# Patient Record
Sex: Female | Born: 1945
Health system: Southern US, Community
[De-identification: ages and names within clinical notes are randomized; demographics above are authoritative.]

## PROBLEM LIST (undated history)

## (undated) DIAGNOSIS — I10 Essential (primary) hypertension: Secondary | ICD-10-CM

## (undated) DIAGNOSIS — M1990 Unspecified osteoarthritis, unspecified site: Secondary | ICD-10-CM

## (undated) DIAGNOSIS — I739 Peripheral vascular disease, unspecified: Secondary | ICD-10-CM

## (undated) DIAGNOSIS — I252 Old myocardial infarction: Secondary | ICD-10-CM

## (undated) DIAGNOSIS — R569 Unspecified convulsions: Secondary | ICD-10-CM

## (undated) DIAGNOSIS — E119 Type 2 diabetes mellitus without complications: Secondary | ICD-10-CM

## (undated) DIAGNOSIS — Z9289 Personal history of other medical treatment: Secondary | ICD-10-CM

## (undated) DIAGNOSIS — F32A Depression, unspecified: Secondary | ICD-10-CM

## (undated) DIAGNOSIS — I2510 Atherosclerotic heart disease of native coronary artery without angina pectoris: Secondary | ICD-10-CM

## (undated) DIAGNOSIS — T7840XA Allergy, unspecified, initial encounter: Secondary | ICD-10-CM

## (undated) DIAGNOSIS — F319 Bipolar disorder, unspecified: Secondary | ICD-10-CM

## (undated) DIAGNOSIS — Z5189 Encounter for other specified aftercare: Secondary | ICD-10-CM

## (undated) DIAGNOSIS — I639 Cerebral infarction, unspecified: Secondary | ICD-10-CM

## (undated) DIAGNOSIS — I6529 Occlusion and stenosis of unspecified carotid artery: Secondary | ICD-10-CM

## (undated) DIAGNOSIS — E278 Other specified disorders of adrenal gland: Secondary | ICD-10-CM

## (undated) DIAGNOSIS — F329 Major depressive disorder, single episode, unspecified: Secondary | ICD-10-CM

## (undated) HISTORY — DX: Personal history of other medical treatment: Z92.89

## (undated) HISTORY — DX: Type 2 diabetes mellitus without complications: E11.9

## (undated) HISTORY — DX: Depression, unspecified: F32.A

## (undated) HISTORY — DX: Encounter for other specified aftercare: Z51.89

## (undated) HISTORY — DX: Allergy, unspecified, initial encounter: T78.40XA

## (undated) HISTORY — DX: Major depressive disorder, single episode, unspecified: F32.9

## (undated) HISTORY — DX: Unspecified osteoarthritis, unspecified site: M19.90

## (undated) HISTORY — PX: NECK SURGERY: SHX720

## (undated) HISTORY — DX: Unspecified convulsions: R56.9

---

## 2010-01-25 HISTORY — PX: BREAST SURGERY: SHX581

## 2010-03-02 DIAGNOSIS — J4489 Other specified chronic obstructive pulmonary disease: Secondary | ICD-10-CM

## 2010-03-02 DIAGNOSIS — J449 Chronic obstructive pulmonary disease, unspecified: Secondary | ICD-10-CM | POA: Insufficient documentation

## 2010-03-02 DIAGNOSIS — I10 Essential (primary) hypertension: Secondary | ICD-10-CM | POA: Insufficient documentation

## 2010-03-02 DIAGNOSIS — F3289 Other specified depressive episodes: Secondary | ICD-10-CM | POA: Insufficient documentation

## 2010-03-02 DIAGNOSIS — D649 Anemia, unspecified: Secondary | ICD-10-CM | POA: Insufficient documentation

## 2010-03-02 DIAGNOSIS — R062 Wheezing: Secondary | ICD-10-CM | POA: Insufficient documentation

## 2010-03-02 DIAGNOSIS — I6789 Other cerebrovascular disease: Secondary | ICD-10-CM | POA: Insufficient documentation

## 2010-03-02 DIAGNOSIS — R059 Cough, unspecified: Secondary | ICD-10-CM | POA: Insufficient documentation

## 2010-03-02 DIAGNOSIS — J189 Pneumonia, unspecified organism: Secondary | ICD-10-CM | POA: Insufficient documentation

## 2010-03-02 DIAGNOSIS — E1149 Type 2 diabetes mellitus with other diabetic neurological complication: Secondary | ICD-10-CM | POA: Insufficient documentation

## 2010-03-02 DIAGNOSIS — R0602 Shortness of breath: Secondary | ICD-10-CM | POA: Insufficient documentation

## 2010-03-02 HISTORY — DX: Other specified chronic obstructive pulmonary disease: J44.89

## 2010-03-02 HISTORY — DX: Chronic obstructive pulmonary disease, unspecified: J44.9

## 2014-05-09 ENCOUNTER — Emergency Department (HOSPITAL_COMMUNITY): Payer: Medicare Other

## 2014-05-09 ENCOUNTER — Encounter (HOSPITAL_COMMUNITY): Payer: Self-pay

## 2014-05-09 ENCOUNTER — Emergency Department (HOSPITAL_COMMUNITY)
Admission: EM | Admit: 2014-05-09 | Discharge: 2014-05-10 | Disposition: A | Discharge status: Home / Self Care | Payer: Medicare Other | Attending: Emergency Medicine | Admitting: Emergency Medicine

## 2014-05-09 DIAGNOSIS — Z7982 Long term (current) use of aspirin: Secondary | ICD-10-CM | POA: Diagnosis not present

## 2014-05-09 DIAGNOSIS — Z79899 Other long term (current) drug therapy: Secondary | ICD-10-CM | POA: Diagnosis not present

## 2014-05-09 DIAGNOSIS — I2510 Atherosclerotic heart disease of native coronary artery without angina pectoris: Secondary | ICD-10-CM | POA: Insufficient documentation

## 2014-05-09 DIAGNOSIS — I252 Old myocardial infarction: Secondary | ICD-10-CM | POA: Insufficient documentation

## 2014-05-09 DIAGNOSIS — F319 Bipolar disorder, unspecified: Secondary | ICD-10-CM | POA: Diagnosis not present

## 2014-05-09 DIAGNOSIS — Z8673 Personal history of transient ischemic attack (TIA), and cerebral infarction without residual deficits: Secondary | ICD-10-CM | POA: Diagnosis not present

## 2014-05-09 DIAGNOSIS — G479 Sleep disorder, unspecified: Secondary | ICD-10-CM | POA: Diagnosis not present

## 2014-05-09 DIAGNOSIS — Z7902 Long term (current) use of antithrombotics/antiplatelets: Secondary | ICD-10-CM | POA: Insufficient documentation

## 2014-05-09 DIAGNOSIS — R4182 Altered mental status, unspecified: Secondary | ICD-10-CM | POA: Diagnosis present

## 2014-05-09 DIAGNOSIS — F419 Anxiety disorder, unspecified: Secondary | ICD-10-CM | POA: Insufficient documentation

## 2014-05-09 DIAGNOSIS — R4701 Aphasia: Secondary | ICD-10-CM | POA: Diagnosis not present

## 2014-05-09 DIAGNOSIS — I10 Essential (primary) hypertension: Secondary | ICD-10-CM | POA: Insufficient documentation

## 2014-05-09 HISTORY — DX: Cerebral infarction, unspecified: I63.9

## 2014-05-09 HISTORY — DX: Essential (primary) hypertension: I10

## 2014-05-09 HISTORY — DX: Bipolar disorder, unspecified: F31.9

## 2014-05-09 HISTORY — DX: Atherosclerotic heart disease of native coronary artery without angina pectoris: I25.10

## 2014-05-09 HISTORY — DX: Old myocardial infarction: I25.2

## 2014-05-09 LAB — URINALYSIS, ROUTINE W REFLEX MICROSCOPIC
Bilirubin Urine: NEGATIVE
Glucose, UA: NEGATIVE mg/dL
HGB URINE DIPSTICK: NEGATIVE
KETONES UR: NEGATIVE mg/dL
NITRITE: NEGATIVE
PROTEIN: NEGATIVE mg/dL
SPECIFIC GRAVITY, URINE: 1.016 (ref 1.005–1.030)
Urobilinogen, UA: 0.2 mg/dL (ref 0.0–1.0)
pH: 6.5 (ref 5.0–8.0)

## 2014-05-09 LAB — CBC
HEMATOCRIT: 38.2 % (ref 36.0–46.0)
HEMOGLOBIN: 11.7 g/dL — AB (ref 12.0–15.0)
MCH: 24.5 pg — AB (ref 26.0–34.0)
MCHC: 30.6 g/dL (ref 30.0–36.0)
MCV: 79.9 fL (ref 78.0–100.0)
Platelets: 302 10*3/uL (ref 150–400)
RBC: 4.78 MIL/uL (ref 3.87–5.11)
RDW: 21 % — ABNORMAL HIGH (ref 11.5–15.5)
WBC: 8.7 10*3/uL (ref 4.0–10.5)

## 2014-05-09 LAB — COMPREHENSIVE METABOLIC PANEL
ALK PHOS: 92 U/L (ref 39–117)
ALT: 31 U/L (ref 0–35)
AST: 29 U/L (ref 0–37)
Albumin: 4.7 g/dL (ref 3.5–5.2)
Anion gap: 7 (ref 5–15)
BUN: 24 mg/dL — ABNORMAL HIGH (ref 6–23)
CO2: 29 mmol/L (ref 19–32)
Calcium: 9.6 mg/dL (ref 8.4–10.5)
Chloride: 99 mmol/L (ref 96–112)
Creatinine, Ser: 1.2 mg/dL — ABNORMAL HIGH (ref 0.50–1.10)
GFR calc Af Amer: 53 mL/min — ABNORMAL LOW (ref >90)
GFR calc non Af Amer: 45 mL/min — ABNORMAL LOW (ref >90)
GLUCOSE: 182 mg/dL — AB (ref 70–99)
Potassium: 3.7 mmol/L (ref 3.5–5.1)
SODIUM: 135 mmol/L (ref 135–145)
TOTAL PROTEIN: 7.4 g/dL (ref 6.0–8.3)
Total Bilirubin: 0.4 mg/dL (ref 0.3–1.2)

## 2014-05-09 LAB — PROTIME-INR
INR: 0.9 (ref 0.00–1.49)
PROTHROMBIN TIME: 12.2 s (ref 11.6–15.2)

## 2014-05-09 LAB — URINE MICROSCOPIC-ADD ON

## 2014-05-09 LAB — CBG MONITORING, ED: Glucose-Capillary: 165 mg/dL — ABNORMAL HIGH (ref 70–99)

## 2014-05-09 LAB — LACTIC ACID, PLASMA: Lactic Acid, Venous: 1.3 mmol/L (ref 0.5–2.0)

## 2014-05-09 MED ORDER — SODIUM CHLORIDE 0.9 % IV SOLN: 1000.0000 mL | INTRAVENOUS | Status: DC

## 2014-05-09 MED ORDER — SODIUM CHLORIDE 0.9 % IV SOLN
1000.0000 mL | Freq: Once | INTRAVENOUS | Status: AC
  Administered 2014-05-09: 1000 mL via INTRAVENOUS

## 2014-05-09 NOTE — ED Provider Notes (Signed)
CSN: [id number]     Arrival date & time 05/09/14  2048 History   First MD Initiated Contact with Patient 05/09/14 2119     Chief Complaint  Patient presents with  . Altered Mental Status     (Consider location/radiation/quality/duration/timing/severity/associated sxs/prior Treatment) HPI Patient presents with her daughter who assists with the history of present illness. The patient herself states she does not want to be here. She acknowledges the patient's daughters concerns. Daughter states that twice during daily the patient has had episodes of slowed speech, without true dysarthria, and without other changes. Each episode lasted a few moments, and then the speech pattern returned to normal. Physician, the patient has had multiple episodes of labile behavior today, with inconsistent reactions, yelling, aggressive behavior. Patient denies recent medication changes, acknowledges a history of bipolar disease, hypertension, coronary artery disease.  Past Medical History  Diagnosis Date  . Bipolar 1 disorder   . Coronary artery disease   . Hypertension   . Stroke   . MI, old    Past Surgical History  Procedure Laterality Date  . Breast surgery    . Neck surgery     History reviewed. No pertinent family history. History  Substance Use Topics  . Smoking status: Never Smoker   . Smokeless tobacco: Not on file  . Alcohol Use: No   OB History    No data available     Review of Systems  Constitutional:       Per HPI, otherwise negative  HENT:       Per HPI, otherwise negative  Respiratory:       Per HPI, otherwise negative  Cardiovascular:       Per HPI, otherwise negative  Gastrointestinal: Negative for vomiting.  Endocrine:       Negative aside from HPI  Genitourinary:       Neg aside from HPI   Musculoskeletal:       Per HPI, otherwise negative  Skin: Negative.   Neurological: Negative for syncope.  Psychiatric/Behavioral: Positive for behavioral problems,  confusion, sleep disturbance, decreased concentration and agitation. Negative for suicidal ideas and hallucinations. The patient is nervous/anxious.       Allergies  Augmentin  Home Medications   Prior to Admission medications   Medication Sig Start Date End Date Taking? Authorizing Provider  ALPRAZolam Duanne Moron) 1 MG tablet Take 1 mg by mouth as needed for anxiety.   Yes Historical Provider, MD  asenapine (SAPHRIS) 5 MG SUBL 24 hr tablet Place 5 mg under the tongue at bedtime.   Yes Historical Provider, MD  aspirin 81 MG chewable tablet Chew 81 mg by mouth daily.   Yes Historical Provider, MD  atorvastatin (LIPITOR) 80 MG tablet Take 80 mg by mouth daily.   Yes Historical Provider, MD  clopidogrel (PLAVIX) 75 MG tablet Take 75 mg by mouth daily.   Yes Historical Provider, MD  ferrous sulfate 325 (65 FE) MG tablet Take 325 mg by mouth daily with breakfast.   Yes Historical Provider, MD  Fluticasone Furoate-Vilanterol 200-25 MCG/INH AEPB Inhale 1 puff into the lungs every morning.   Yes Historical Provider, MD  fluticasone-salmeterol (ADVAIR HFA) 115-21 MCG/ACT inhaler Inhale 1 puff into the lungs every morning.   Yes Historical Provider, MD  furosemide (LASIX) 40 MG tablet Take 40 mg by mouth every morning. May take 2 tabs if retaining fluid   Yes Historical Provider, MD  gabapentin (NEURONTIN) 300 MG capsule Take 300 mg by mouth 3 (three) times  daily.   Yes Historical Provider, MD  isosorbide mononitrate (IMDUR) 30 MG 24 hr tablet Take 30 mg by mouth every morning.   Yes Historical Provider, MD  lamoTRIgine (LAMICTAL) 200 MG tablet Take 200 mg by mouth every evening.   Yes Historical Provider, MD  metoprolol tartrate (LOPRESSOR) 25 MG tablet Take 25 mg by mouth 2 (two) times daily.   Yes Historical Provider, MD  naproxen (NAPROSYN) 500 MG tablet Take 500 mg by mouth 2 (two) times daily as needed for mild pain.   Yes Historical Provider, MD  nitroGLYCERIN (NITROSTAT) 0.4 MG SL tablet Place  0.4 mg under the tongue every 5 (five) minutes as needed for chest pain.   Yes Historical Provider, MD  potassium chloride (K-DUR,KLOR-CON) 10 MEQ tablet Take 10 mEq by mouth 3 (three) times daily.   Yes Historical Provider, MD   BP 140/63 mmHg  Pulse 62  Temp(Src) 97.8 F (36.6 C) (Oral)  Resp 19  Ht 5' 0.75" (1.543 m)  Wt 125 lb (56.7 kg)  BMI 23.82 kg/m2  SpO2 98% Physical Exam  Constitutional: She is oriented to person, place, and time. She appears well-developed and well-nourished. No distress.  HENT:  Head: Normocephalic and atraumatic.  Eyes: Conjunctivae and EOM are normal.  Cardiovascular: Normal rate and regular rhythm.   Pulmonary/Chest: Effort normal and breath sounds normal. No stridor. No respiratory distress.  Abdominal: She exhibits no distension.  Musculoskeletal: She exhibits no edema.  Neurological: She is alert and oriented to person, place, and time. She displays no atrophy and no tremor. No cranial nerve deficit. She exhibits normal muscle tone. She displays no seizure activity. Coordination normal.  Skin: Skin is warm and dry.  Psychiatric: Her mood appears anxious. Her speech is rapid and/or pressured. She is agitated, aggressive and hyperactive. Thought content is not paranoid and not delusional. Cognition and memory are not impaired. She expresses impulsivity. She expresses no suicidal ideation. She expresses no suicidal plans.  Nursing note and vitals reviewed.   ED Course  Procedures (including critical care time) Labs Review Labs Reviewed  CBC - Abnormal; Notable for the following:    Hemoglobin 11.7 (*)    MCH 24.5 (*)    RDW 21.0 (*)    All other components within normal limits  COMPREHENSIVE METABOLIC PANEL - Abnormal; Notable for the following:    Glucose, Bld 182 (*)    BUN 24 (*)    Creatinine, Ser 1.20 (*)    GFR calc non Af Amer 45 (*)    GFR calc Af Amer 53 (*)    All other components within normal limits  URINALYSIS, ROUTINE W REFLEX  MICROSCOPIC - Abnormal; Notable for the following:    APPearance CLOUDY (*)    Leukocytes, UA SMALL (*)    All other components within normal limits  URINE MICROSCOPIC-ADD ON - Abnormal; Notable for the following:    Squamous Epithelial / LPF FEW (*)    All other components within normal limits  CBG MONITORING, ED - Abnormal; Notable for the following:    Glucose-Capillary 165 (*)    All other components within normal limits  LACTIC ACID, PLASMA  PROTIME-INR    Imaging Review Ct Head Wo Contrast  05/09/2014   CLINICAL DATA:  Altered mental status.  EXAM: CT HEAD WITHOUT CONTRAST  TECHNIQUE: Contiguous axial images were obtained from the base of the skull through the vertex without intravenous contrast.  COMPARISON:  None.  FINDINGS: Skull and Sinuses:No fracture or destructive  process. Mild plagiocephaly.  Orbits: Bilateral cataract resection  Brain: Dense gliosis with encephalomalacia in the right PCA territory consistent with remote infarct. There is low-attenuation around the lateral ventricles compatible with chronic small vessel disease. No evidence for acute infarct, hemorrhage, hydrocephalus, or shift.  IMPRESSION: 1. No acute intracranial findings. 2. Remote right PCA infarct. 3. Mild chronic small vessel disease.   Electronically Signed   By: Monte Fantasia M.D.   On: 05/09/2014 23:41     EKG Interpretation   Date/Time:  Thursday May 09 2014 22:06:57 EDT Ventricular Rate:  61 PR Interval:  141 QRS Duration: 95 QT Interval:  418 QTC Calculation: 421 R Axis:   71 Text Interpretation:  Sinus rhythm Minimal ST depression, diffuse leads  Sinus rhythm Artifact Non-specific intra-ventricular conduction delay ST-t  wave abnormality Abnormal ekg Confirmed by Carmin Muskrat  MD ([id number]) on  05/09/2014 10:50:31 PM     12:08 AM On repeat exam the patient appears calm, in no distress. I discussed all findings with her and her daughter.  MDM   Final diagnoses:  Aphasia     Patient presents with episodic aphasia, more labile behavior than usual. Patient has multiple medical issues, including bipolar disease, prior stroke. Patient is on Plavix, and with no evidence for acute new pathology, already has maximum medical prophylaxis for strokes. No evidence for other acute new pathology, including infection, ACS. Patient remained hemodynamically stable, in no distress throughout her course here, was discharged in stable condition.    Carmin Muskrat, MD 05/10/14 [phone]
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
## 2014-05-09 NOTE — ED Notes (Signed)
Nurse getting labs 

## 2014-05-10 NOTE — Discharge Instructions (Signed)
As discussed, your evaluation today has been largely reassuring.  But, it is important that you monitor your condition carefully, and do not hesitate to return to the ED if you develop new, or concerning changes in your condition. ? ?Otherwise, please follow-up with your physician for appropriate ongoing care. ? ?

## 2014-05-10 NOTE — ED Notes (Signed)
Pt is upset because she wasn't getting any Mental Health help, the daughter understands that need to be doen in [place], [place]. Daughter is ok with the medical exam that was performed, that was her main concern.

## 2014-06-07 ENCOUNTER — Emergency Department (INDEPENDENT_AMBULATORY_CARE_PROVIDER_SITE_OTHER)
Admission: EM | Admit: 2014-06-07 | Discharge: 2014-06-07 | Disposition: A | Discharge status: Other Acute Inpatient Hospital | Payer: Medicare Other | Source: Home / Self Care | Attending: Family Medicine | Admitting: Family Medicine

## 2014-06-07 ENCOUNTER — Emergency Department (HOSPITAL_COMMUNITY): Payer: Medicare Other

## 2014-06-07 ENCOUNTER — Encounter (HOSPITAL_COMMUNITY): Payer: Self-pay

## 2014-06-07 ENCOUNTER — Emergency Department (HOSPITAL_COMMUNITY)
Admission: EM | Admit: 2014-06-07 | Discharge: 2014-06-07 | Disposition: A | Discharge status: Home / Self Care | Payer: Medicare Other | Attending: Emergency Medicine | Admitting: Emergency Medicine

## 2014-06-07 DIAGNOSIS — Z7951 Long term (current) use of inhaled steroids: Secondary | ICD-10-CM | POA: Insufficient documentation

## 2014-06-07 DIAGNOSIS — R51 Headache: Secondary | ICD-10-CM | POA: Diagnosis not present

## 2014-06-07 DIAGNOSIS — F319 Bipolar disorder, unspecified: Secondary | ICD-10-CM | POA: Diagnosis not present

## 2014-06-07 DIAGNOSIS — Z7982 Long term (current) use of aspirin: Secondary | ICD-10-CM | POA: Insufficient documentation

## 2014-06-07 DIAGNOSIS — I10 Essential (primary) hypertension: Secondary | ICD-10-CM | POA: Insufficient documentation

## 2014-06-07 DIAGNOSIS — I252 Old myocardial infarction: Secondary | ICD-10-CM | POA: Diagnosis not present

## 2014-06-07 DIAGNOSIS — Z880 Allergy status to penicillin: Secondary | ICD-10-CM | POA: Insufficient documentation

## 2014-06-07 DIAGNOSIS — M5481 Occipital neuralgia: Secondary | ICD-10-CM

## 2014-06-07 DIAGNOSIS — Z8673 Personal history of transient ischemic attack (TIA), and cerebral infarction without residual deficits: Secondary | ICD-10-CM | POA: Diagnosis not present

## 2014-06-07 DIAGNOSIS — Z7902 Long term (current) use of antithrombotics/antiplatelets: Secondary | ICD-10-CM | POA: Insufficient documentation

## 2014-06-07 DIAGNOSIS — Z79899 Other long term (current) drug therapy: Secondary | ICD-10-CM | POA: Insufficient documentation

## 2014-06-07 DIAGNOSIS — I2510 Atherosclerotic heart disease of native coronary artery without angina pectoris: Secondary | ICD-10-CM | POA: Diagnosis not present

## 2014-06-07 DIAGNOSIS — R519 Headache, unspecified: Secondary | ICD-10-CM

## 2014-06-07 LAB — CBC WITH DIFFERENTIAL/PLATELET
BASOS PCT: 1 % (ref 0–1)
Basophils Absolute: 0.1 10*3/uL (ref 0.0–0.1)
EOS PCT: 2 % (ref 0–5)
Eosinophils Absolute: 0.2 10*3/uL (ref 0.0–0.7)
HCT: 35.9 % — ABNORMAL LOW (ref 36.0–46.0)
Hemoglobin: 11.5 g/dL — ABNORMAL LOW (ref 12.0–15.0)
LYMPHS PCT: 18 % (ref 12–46)
Lymphs Abs: 1.5 10*3/uL (ref 0.7–4.0)
MCH: 25.9 pg — ABNORMAL LOW (ref 26.0–34.0)
MCHC: 32 g/dL (ref 30.0–36.0)
MCV: 80.9 fL (ref 78.0–100.0)
MONO ABS: 0.9 10*3/uL (ref 0.1–1.0)
Monocytes Relative: 11 % (ref 3–12)
NEUTROS PCT: 68 % (ref 43–77)
Neutro Abs: 5.6 10*3/uL (ref 1.7–7.7)
Platelets: 234 10*3/uL (ref 150–400)
RBC: 4.44 MIL/uL (ref 3.87–5.11)
RDW: 22.4 % — ABNORMAL HIGH (ref 11.5–15.5)
WBC: 8.3 10*3/uL (ref 4.0–10.5)

## 2014-06-07 LAB — BASIC METABOLIC PANEL
Anion gap: 11 (ref 5–15)
BUN: 20 mg/dL (ref 6–20)
CO2: 26 mmol/L (ref 22–32)
Calcium: 9.4 mg/dL (ref 8.9–10.3)
Chloride: 101 mmol/L (ref 101–111)
Creatinine, Ser: 0.92 mg/dL (ref 0.44–1.00)
GFR calc Af Amer: >60 mL/min (ref >60)
Glucose, Bld: 99 mg/dL (ref 65–99)
POTASSIUM: 4.5 mmol/L (ref 3.5–5.1)
Sodium: 138 mmol/L (ref 135–145)

## 2014-06-07 LAB — GRAM STAIN

## 2014-06-07 LAB — CSF CELL COUNT WITH DIFFERENTIAL
RBC COUNT CSF: 8 /mm3 — AB
RBC Count, CSF: 0 /mm3
TUBE #: 4
Tube #: 1
WBC, CSF: 1 /mm3 (ref 0–5)
WBC, CSF: 2 /mm3 (ref 0–5)

## 2014-06-07 LAB — PROTEIN, CSF: Total  Protein, CSF: 23 mg/dL (ref 15–45)

## 2014-06-07 LAB — SEDIMENTATION RATE: SED RATE: 30 mm/h — AB (ref 0–22)

## 2014-06-07 LAB — GLUCOSE, CSF: Glucose, CSF: 64 mg/dL (ref 40–70)

## 2014-06-07 MED ORDER — SODIUM CHLORIDE 0.9 % IV BOLUS (SEPSIS)
500.0000 mL | Freq: Once | INTRAVENOUS | Status: AC
  Administered 2014-06-07: 500 mL via INTRAVENOUS

## 2014-06-07 MED ORDER — SODIUM CHLORIDE 0.9 % IV SOLN
Freq: Once | INTRAVENOUS | Status: AC
  Administered 2014-06-07: 12:00:00 via INTRAVENOUS

## 2014-06-07 MED ORDER — CARBAMAZEPINE ER 200 MG PO TB12: 200.0000 mg | ORAL_TABLET | Freq: Two times a day (BID) | ORAL | Status: DC

## 2014-06-07 MED ORDER — PROCHLORPERAZINE EDISYLATE 5 MG/ML IJ SOLN
10.0000 mg | Freq: Once | INTRAMUSCULAR | Status: AC
  Administered 2014-06-07: 10 mg via INTRAVENOUS
  Filled 2014-06-07: qty 2

## 2014-06-07 MED ORDER — IOHEXOL 350 MG/ML SOLN
50.0000 mL | Freq: Once | INTRAVENOUS | Status: AC | PRN
  Administered 2014-06-07: 50 mL via INTRAVENOUS

## 2014-06-07 MED ORDER — ONDANSETRON HCL 4 MG/2ML IJ SOLN
4.0000 mg | Freq: Once | INTRAMUSCULAR | Status: AC
  Administered 2014-06-07: 4 mg via INTRAVENOUS
  Filled 2014-06-07: qty 2

## 2014-06-07 MED ORDER — CARBAMAZEPINE ER 200 MG PO TB12
200.0000 mg | ORAL_TABLET | ORAL | Status: AC
  Administered 2014-06-07: 200 mg via ORAL
  Filled 2014-06-07: qty 1

## 2014-06-07 MED ORDER — CARBAMAZEPINE 200 MG PO TABS: 200.0000 mg | ORAL_TABLET | Freq: Two times a day (BID) | ORAL | Status: DC

## 2014-06-07 MED ORDER — HYDROMORPHONE HCL 1 MG/ML IJ SOLN
0.5000 mg | Freq: Once | INTRAMUSCULAR | Status: AC
  Administered 2014-06-07: 0.5 mg via INTRAVENOUS
  Filled 2014-06-07: qty 1

## 2014-06-07 MED ORDER — SODIUM CHLORIDE 0.9 % IV SOLN
Freq: Once | INTRAVENOUS | Status: AC
  Administered 2014-06-07: 13:00:00 via INTRAVENOUS

## 2014-06-07 MED ORDER — DIPHENHYDRAMINE HCL 50 MG/ML IJ SOLN
25.0000 mg | Freq: Once | INTRAMUSCULAR | Status: AC
  Administered 2014-06-07: 25 mg via INTRAVENOUS
  Filled 2014-06-07: qty 1

## 2014-06-07 NOTE — ED Notes (Signed)
C/o HA x 2 days. minimal memory , due to prior CVA. Reported had multiple vascular blockages which will require surgery, but was reportedly delayed by provider due to physical condition. NAD, w/d/color good. Ambulatory . No observable deficits on inspection. HA "8" on 1-10 scale. On Plavix due to cardiac issues. No known injury

## 2014-06-07 NOTE — ED Notes (Signed)
[facility] called for transport. Melissa, CN, advised of pending trnasfer

## 2014-06-07 NOTE — ED Notes (Signed)
Pt presents with intermittent sharp pain to L side of head x 3 days.  Pt denies any headache, unsure of injury, pt has residual memory loss from CVA in October.

## 2014-06-07 NOTE — ED Provider Notes (Signed)
1630 signed out by Dr Betsey Holiday, that if cta neg acute, to d/c home per neurology recommendations.  Neurology, Dr Leonel Ramsay has evaluated in ED, reviewed ct/cta - he indicates d/c to home w rx tegretol 200 mg bid.  Recheck pt comfortable, afeb. Will d/c per neurology recommendations. Return precautions provided.      Lajean Saver, MD 06/07/14 1754

## 2014-06-07 NOTE — Discharge Instructions (Signed)
It was our pleasure to provide your ER care today - we hope that you feel better.  Rest. Drink plenty of fluids.  See post LP instructions.  Take tegretol as prescribed.  Follow up with your doctor/neurologist in coming week for recheck - see referral - call office Monday morning to arrange follow up with them.    Your CTA was read as follows (discuss these results with neurologist/your doctor at follow up with them in coming week):  IMPRESSION: Remote RIGHT PCA territory cerebral infarction as described.  75% ostial stenosis of the dominant LEFT vertebral.  Otherwise no intracranial or extracranial flow reducing lesion, dissection, or occlusion.      Return to ER if worse, new symptoms, fevers, worsening or severe pain, persistent vomiting, other concern.     Occipital Neuralgia Occipital neuralgia is a type of headache that causes episodes of very bad pain in the back of your head. Pain from occipital neuralgia may spread (radiate) to other parts of your head. The pain is usually brief and often goes away after you rest and relax. These headaches may be caused by irritation of the nerves that leave your spinal cord high up in your neck, just below the base of your skull (occipital nerves). Your occipital nerves transmit sensations from the back of your head, the top of your head, and the areas behind your ears. CAUSES Occipital neuralgia can occur without any known cause (primary headache syndrome). In other cases, occipital neuralgia is caused by pressure on or irritation of one of the two occipital nerves. Causes of occipital nerve compression or irritation include:  Wear and tear of the vertebrae in the neck (osteoarthritis).  Neck injury.  Disease of the disks that separate the vertebrae.  Tumors.  Gout.  Infections.  Diabetes.  Swollen blood vessels that put pressure on the occipital nerves.  Muscle spasm in the neck. SIGNS AND SYMPTOMS Pain is the main  symptom of occipital neuralgia. It usually starts in the back of the head but may also be felt in other areas supplied by the occipital nerves. Pain is usually on one side but may be on both sides. You may have:   Brief episodes of very bad pain that is burning, stabbing, shocking, or shooting.  Pain behind the eye.  Pain triggered by neck movement or hair brushing.  Scalp tenderness.  Aching in the back of the head between episodes of very bad pain. DIAGNOSIS  Your health care provider may diagnose occipital neuralgia based on your symptoms and a physical exam. During the exam, the health care provider may push on areas supplied by the occipital nerves to see if they are painful. Some tests may also be done to help in making the diagnosis. These may include:  Imaging studies of the upper spinal cord, such as an MRI or CT scan. These may show compression or spinal cord abnormalities.  Nerve block. You will get an injection of numbing medicine (local anesthetic) near the occipital nerve to see if this relieves pain. TREATMENT  Treatment may begin with simple measures, such as:   Rest.  Massage.  Heat.  Over-the-counter pain relievers. If these measures do not work, you may need other treatments, including:  Medicines such as:  Prescription-strength anti-inflammatory medicines.  Muscle relaxants.  Antiseizure medicines.  Antidepressants.  Steroid injection. This involves injections of local anesthetic and strong anti-inflammatory drugs (steroids).  Pulsed radiofrequency. Wires are implanted to deliver electrical impulses that block pain signals from the occipital  nerve.  Physical therapy.  Surgery to relieve nerve pressure. HOME CARE INSTRUCTIONS  Take all medicines as directed by your health care provider.  Avoid activities that cause pain.  Rest when you have an attack of pain.  Try gentle massage or a heating pad to relieve pain.  Work with a physical  therapist to learn stretching exercises you can do at home.  Try a different pillow or sleeping position.  Practice good posture.  Try to stay active. Get regular exercise that does not cause pain. Ask your health care provider to suggest safe exercises for you.  Keep all follow-up visits as directed by your health care provider. This is important. SEEK MEDICAL CARE IF:  Your medicine is not working.  You have new or worsening symptoms. SEEK IMMEDIATE MEDICAL CARE IF:  You have very bad head pain that is not going away.  You have a sudden change in vision, balance, or speech. MAKE SURE YOU:  Understand these instructions.  Will watch your condition.  Will get help right away if you are not doing well or get worse. Document Released: 01/05/2001 Document Revised: 05/28/2013 Document Reviewed: 01/03/2013 [facility] Patient Information 2015 [place], [place]. This information is not intended to replace advice given to you by your health care provider. Make sure you discuss any questions you have with your health care provider.   Lumbar Puncture, Care After Refer to this sheet in the next few weeks. These instructions provide you with information on caring for yourself after your procedure. Your health care provider may also give you more specific instructions. Your treatment has been planned according to current medical practices, but problems sometimes occur. Call your health care provider if you have any problems or questions after your procedure. WHAT TO EXPECT AFTER THE PROCEDURE After your procedure, it is typical to have the following sensations:  Mild discomfort or pain at the insertion site.  Mild headache that is relieved with pain medicines. HOME CARE INSTRUCTIONS  Avoid lifting anything heavier than 10 lb (4.5 kg) for at least 12 hours after the procedure.  Drink enough fluids to keep your urine clear or pale yellow. SEEK MEDICAL CARE IF:  You have fever or  chills.  You have nausea or vomiting.  You have a headache that lasts for more than 2 days. SEEK IMMEDIATE MEDICAL CARE IF:  You have any numbness or tingling in your legs.  You are unable to control your bowel or bladder.  You have bleeding or swelling in your back at the insertion site.  You are dizzy or faint. Document Released: 01/16/2013 Document Reviewed: 01/16/2013 [facility] Patient Information 2015 [place], [place]. This information is not intended to replace advice given to you by your health care provider. Make sure you discuss any questions you have with your health care provider.      General Headache Without Cause A headache is pain or discomfort felt around the head or neck area. The specific cause of a headache may not be found. There are many causes and types of headaches. A few common ones are:  Tension headaches.  Migraine headaches.  Cluster headaches.  Chronic daily headaches. HOME CARE INSTRUCTIONS   Keep all follow-up appointments with your caregiver or any specialist referral.  Only take over-the-counter or prescription medicines for pain or discomfort as directed by your caregiver.  Lie down in a dark, quiet room when you have a headache.  Keep a headache journal to find out what may trigger your migraine headaches.  For example, write down:  What you eat and drink.  How much sleep you get.  Any change to your diet or medicines.  Try massage or other relaxation techniques.  Put ice packs or heat on the head and neck. Use these 3 to 4 times per day for 15 to 20 minutes each time, or as needed.  Limit stress.  Sit up straight, and do not tense your muscles.  Quit smoking if you smoke.  Limit alcohol use.  Decrease the amount of caffeine you drink, or stop drinking caffeine.  Eat and sleep on a regular schedule.  Get 7 to 9 hours of sleep, or as recommended by your caregiver.  Keep lights dim if bright lights bother you and make your  headaches worse. SEEK MEDICAL CARE IF:   You have problems with the medicines you were prescribed.  Your medicines are not working.  You have a change from the usual headache.  You have nausea or vomiting. SEEK IMMEDIATE MEDICAL CARE IF:   Your headache becomes severe.  You have a fever.  You have a stiff neck.  You have loss of vision.  You have muscular weakness or loss of muscle control.  You start losing your balance or have trouble walking.  You feel faint or pass out.  You have severe symptoms that are different from your first symptoms. MAKE SURE YOU:   Understand these instructions.  Will watch your condition.  Will get help right away if you are not doing well or get worse. Document Released: 01/11/2005 Document Revised: 04/05/2011 Document Reviewed: 01/27/2011 [facility] Patient Information 2015 [place], [place]. This information is not intended to replace advice given to you by your health care provider. Make sure you discuss any questions you have with your health care provider.   Lumbar Puncture A lumbar puncture, or spinal tap, is a procedure in which a small amount of the fluid that surrounds the brain and spinal cord is removed and examined. The fluid is called the cerebrospinal fluid. This procedure may be done to:   Help diagnose various problems, such as meningitis, encephalitis, multiple sclerosis, and AIDS.   Remove fluid and relieve pressure that occurs with certain types of headaches.   Look for bleeding within the brain and spinal cord areas (central nervous system).   Place medicine into the spinal fluid.  LET [facility] CARE PROVIDER KNOW ABOUT:  Any allergies you have.  All medicines you are taking, including vitamins, herbs, eye drops, creams, and over-the-counter medicines.  Previous problems you or members of your family have had with the use of anesthetics.  Any blood disorders you have.  Previous surgeries you have  had.  Medical conditions you have. RISKS AND COMPLICATIONS Generally, this is a safe procedure. However, as with any procedure, complications can occur. Possible complications include:   Spinal headache. This is a severe headache that occurs when there is a leak of spinal fluid. A spinal headache causes discomfort but is not dangerous. If it persists, another procedure may be done to treat the headache.  Bleeding. This most often occurs in people with bleeding disorders. These are disorders in which the blood does not clot normally.   Infection at the insertion site that can spread to the bone or spinal fluid.  Formation of a spinal cord tumor (rare).  Brain herniation or movement of the brain into the spinal cord (rare).  Inability to move (extremely rare). BEFORE THE PROCEDURE  You may have blood tests done.  These tests can help tell how well your kidneys and liver are working. They can also show how well your blood clots.   If you take blood thinners (anticoagulant medicine), ask your health care provider if and when you should stop taking them.   Your health care provider may order a CT scan of your brain.  Make arrangements for someone to drive you home after the procedure.  PROCEDURE  You will be positioned so that the spaces between the bones of the spine (vertebrae) are as wide as possible. This will make it easier to pass the needle into the spinal canal.  Depending on your age and size, you may lie on your side, curled up with your knees under your chin. Or, you may sit with your head resting on a pillow that is placed at waist level.  The skin covering the lower back (or lumbar region) will be cleaned.   The skin may be numbed with medicine.  You may be given pain medicine or a medicine to help you relax (sedative).  A small needle will be inserted in the skin until it enters the space that contains the spinal fluid. The needle will not enter the spinal  cord.   The spinal fluid will be collected into tubes.   The needle will be withdrawn, and a bandage will be placed on the site.  AFTER THE PROCEDURE  You will remain lying down for 1 hour or for as long as your health care provider suggests.   The spinal fluid will be sent to a laboratory to be examined. The results of the examination may be available before you go home.  A test, called a culture, may be taken of the spinal fluid if your health care provider thinks you have an infection. If cultures were taken for exam, the results will usually be available in a couple of days.  Document Released: 01/09/2000 Document Revised: 11/01/2012 Document Reviewed: 09/18/2012 [facility] Patient Information 2015 [place], [place]. This information is not intended to replace advice given to you by your health care provider. Make sure you discuss any questions you have with your health care provider.

## 2014-06-07 NOTE — ED Notes (Signed)
Patient transported to CT 

## 2014-06-07 NOTE — ED Provider Notes (Signed)
CSN: [id number]     Arrival date & time 06/07/14  1219 History   First MD Initiated Contact with Patient 06/07/14 1220     Chief Complaint  Patient presents with  . Headache     (Consider location/radiation/quality/duration/timing/severity/associated sxs/prior Treatment) HPI Comments: Patient presents to the ER for evaluation of headache. Patient reports that the headache started yesterday. She reports that it was dull and tolerable yesterday, but this morning when she awakened it was much worse. She reports that pain is sharp and stabbing in the left parietal area of her head. The pain has been waxing and waning over the course of the morning. She was seen in urgent care and referred to the ER for further evaluation. She has not had any of this, tingling, weakness in extremities. She has not had a fever.   Past Medical History  Diagnosis Date  . Bipolar 1 disorder   . Coronary artery disease   . Hypertension   . Stroke   . MI, old    Past Surgical History  Procedure Laterality Date  . Breast surgery    . Neck surgery     History reviewed. No pertinent family history. History  Substance Use Topics  . Smoking status: Never Smoker   . Smokeless tobacco: Not on file  . Alcohol Use: No   OB History    No data available     Review of Systems  Neurological: Positive for headaches.  All other systems reviewed and are negative.     Allergies  Amoxicillin; Augmentin; Cephalexin; Erythromycin; Niacin and related; and Other  Home Medications   Prior to Admission medications   Medication Sig Start Date End Date Taking? Authorizing Provider  ALPRAZolam Duanne Moron) 1 MG tablet Take 1 mg by mouth at bedtime as needed for anxiety.    Yes Historical Provider, MD  asenapine (SAPHRIS) 5 MG SUBL 24 hr tablet Place 5 mg under the tongue 2 (two) times daily.   Yes Historical Provider, MD  aspirin 81 MG chewable tablet Chew 81 mg by mouth daily.   Yes Historical Provider, MD  atorvastatin  (LIPITOR) 80 MG tablet Take 80 mg by mouth daily.   Yes Historical Provider, MD  clopidogrel (PLAVIX) 75 MG tablet Take 75 mg by mouth daily.   Yes Historical Provider, MD  ferrous sulfate 325 (65 FE) MG tablet Take 325 mg by mouth daily with breakfast.   Yes Historical Provider, MD  Fluticasone Furoate-Vilanterol 200-25 MCG/INH AEPB Inhale 1 puff into the lungs every morning.   Yes Historical Provider, MD  fluticasone-salmeterol (ADVAIR HFA) 115-21 MCG/ACT inhaler Inhale 1 puff into the lungs every morning.   Yes Historical Provider, MD  furosemide (LASIX) 40 MG tablet Take 40 mg by mouth every morning. May take 2 tabs if retaining fluid   Yes Historical Provider, MD  gabapentin (NEURONTIN) 100 MG capsule Take 100 mg by mouth 2 (two) times daily. 05/20/14  Yes Historical Provider, MD  isosorbide mononitrate (IMDUR) 30 MG 24 hr tablet Take 30 mg by mouth every morning.   Yes Historical Provider, MD  lamoTRIgine (LAMICTAL) 200 MG tablet Take 200 mg by mouth every evening.   Yes Historical Provider, MD  metoprolol tartrate (LOPRESSOR) 25 MG tablet Take 25 mg by mouth 2 (two) times daily.   Yes Historical Provider, MD  naproxen (NAPROSYN) 500 MG tablet Take 500 mg by mouth 2 (two) times daily as needed for mild pain.   Yes Historical Provider, MD  nitroGLYCERIN (NITROSTAT) 0.4  MG SL tablet Place 0.4 mg under the tongue every 5 (five) minutes as needed for chest pain.   Yes Historical Provider, MD  NUEDEXTA 20-10 MG CAPS Take 1 tablet by mouth daily. 05/28/14  Yes Historical Provider, MD  potassium chloride (K-DUR,KLOR-CON) 10 MEQ tablet Take 10 mEq by mouth 3 (three) times daily.   Yes Historical Provider, MD  VENTOLIN HFA 108 (90 BASE) MCG/ACT inhaler Inhale 2 puffs into the lungs every 4 (four) hours as needed. wheezing 06/04/14  Yes Historical Provider, MD   BP 134/83 mmHg  Pulse 64  Temp(Src) 98 F (36.7 C) (Oral)  Resp 18  SpO2 94% Physical Exam  Constitutional: She is oriented to person, place,  and time. She appears well-developed and well-nourished. No distress.  HENT:  Head: Normocephalic and atraumatic.  Right Ear: Hearing normal.  Left Ear: Hearing normal.  Nose: Nose normal.  Mouth/Throat: Oropharynx is clear and moist and mucous membranes are normal.  Eyes: Conjunctivae and EOM are normal. Pupils are equal, round, and reactive to light.  Neck: Normal range of motion. Neck supple.  Cardiovascular: Regular rhythm, S1 normal and S2 normal.  Exam reveals no gallop and no friction rub.   No murmur heard. Pulmonary/Chest: Effort normal and breath sounds normal. No respiratory distress. She exhibits no tenderness.  Abdominal: Soft. Normal appearance and bowel sounds are normal. There is no hepatosplenomegaly. There is no tenderness. There is no rebound, no guarding, no tenderness at McBurney's point and negative Murphy's sign. No hernia.  Musculoskeletal: Normal range of motion.  Neurological: She is alert and oriented to person, place, and time. She has normal strength. No cranial nerve deficit or sensory deficit. Coordination normal. GCS eye subscore is 4. GCS verbal subscore is 5. GCS motor subscore is 6.  Extraocular muscle movement: normal No visual field cut Pupils: equal and reactive both direct and consensual response is normal No nystagmus present    Sensory function is intact to light touch, pinprick Proprioception intact  Grip strength 5/5 symmetric in upper extremities No pronator drift Normal finger to nose bilaterally  Lower extremity strength 5/5 against gravity Normal heel to shin bilaterally  Gait: normal   Skin: Skin is warm, dry and intact. No rash noted. No cyanosis.  Psychiatric: She has a normal mood and affect. Her speech is normal and behavior is normal. Thought content normal.  Nursing note and vitals reviewed.   ED Course  LUMBAR PUNCTURE Date/Time: 06/07/2014 3:29 PM Performed by: Orpah Greek Authorized by: Orpah Greek Consent: Verbal consent obtained. Written consent obtained. Risks and benefits: risks, benefits and alternatives were discussed Consent given by: patient Patient understanding: patient states understanding of the procedure being performed Patient consent: the patient's understanding of the procedure matches consent given Procedure consent: procedure consent matches procedure scheduled Relevant documents: relevant documents present and verified Test results: test results available and properly labeled Site marked: the operative site was marked Imaging studies: imaging studies available Patient identity confirmed: verbally with patient, arm band and hospital-assigned identification number Time out: Immediately prior to procedure a "time out" was called to verify the correct patient, procedure, equipment, support staff and site/side marked as required. Indications: evaluation for subarachnoid hemorrhage Anesthesia: local infiltration Local anesthetic: lidocaine 1% without epinephrine Anesthetic total: 3 ml Patient sedated: no Preparation: Patient was prepped and draped in the usual sterile fashion. Lumbar space: L4-L5 interspace Patient's position: sitting Needle gauge: 20 Needle type: spinal needle - Quincke tip Needle length: 3.5 in Number  of attempts: 1 Fluid appearance: clear Tubes of fluid: 4 Total volume: 5 ml Post-procedure: site cleaned, pressure dressing applied and adhesive bandage applied Patient tolerance: Patient tolerated the procedure well with no immediate complications   (including critical care time) Labs Review Labs Reviewed  CBC WITH DIFFERENTIAL/PLATELET - Abnormal; Notable for the following:    Hemoglobin 11.5 (*)    HCT 35.9 (*)    MCH 25.9 (*)    RDW 22.4 (*)    All other components within normal limits  SEDIMENTATION RATE - Abnormal; Notable for the following:    Sed Rate 30 (*)    All other components within normal limits  CSF CULTURE  GRAM STAIN   BASIC METABOLIC PANEL  CSF CELL COUNT WITH DIFFERENTIAL  CSF CELL COUNT WITH DIFFERENTIAL  GLUCOSE, CSF  PROTEIN, CSF    Imaging Review Ct Head Wo Contrast  06/07/2014   CLINICAL DATA:  Stabbing headache to left-sided head 2 days.  EXAM: CT HEAD WITHOUT CONTRAST  TECHNIQUE: Contiguous axial images were obtained from the base of the skull through the vertex without intravenous contrast.  COMPARISON:  None.  FINDINGS: Large region of encephalomalacia involving the right parietal, occipital, and temporal lobes. There is accompanying ex vacuo dilatation the adjacent right ventricle atria.  No acute intracranial hemorrhage. No focal mass lesion. No CT evidence of acute infarction. No midline shift or mass effect. No hydrocephalus. Basilar cisterns are patent.  There is mild periventricular white matter hypodensities.  Insert clear mastoids  IMPRESSION: 1. No acute intracranial findings. 2. Large posterior infarction involving the right temporal, parietal, and occipital lobes. 3. Mild periventricular white matter microvascular disease.   Electronically Signed   By: Suzy Bouchard M.D.   On: 06/07/2014 13:31     EKG Interpretation None      MDM   Final diagnoses:  Headache    Patient presents to the ER for evaluation of headache. Patient reports onset of headache yesterday, but it was mild when it first started. She woke this morning with severe headache that has been constant. Patient reports that the pain has been continuous today but she has intermittent episodes of sharp stabbing pains that occur and only last for a very brief time before the pain dulls to its baseline. She does not normally have headaches.  She is neurologic examination was unremarkable. She does not have fever. Sedimentation rate is 30, doubt or arteritis. She does not have any vision change, jaw claudication associated with the headache. There is no neck pain or stiffness. No concern for intracranial infection causing  symptoms. Subarachnoid hemorrhage is considered, although she does not have any concerning features, such as sudden onset headache. CT scan was performed and is negative. This was performed more than 6 hours after onset of headache, however, and therefore lumbar puncture was recommended. Patient did consent to the procedure which was performed.  Neurology has been consulted. It has been recommended to perform CT angiography of head and neck with venous phase. Study has been ordered. Will sign out to oncoming ER physician to follow-up on CSF studies, CT scans, final neurology recommendations.  Orpah Greek, MD 06/07/14 1606

## 2014-06-07 NOTE — ED Provider Notes (Signed)
CSN: [id number]     Arrival date & time 06/07/14  61 History   First MD Initiated Contact with Patient 06/07/14 1119     Chief Complaint  Patient presents with  . Headache   (Consider location/radiation/quality/duration/timing/severity/associated sxs/prior Treatment) Patient is a 69 y.o. female presenting with headaches. The history is provided by the patient and a relative.  Headache Pain location:  L parietal Quality:  Sharp Radiates to:  Does not radiate Severity currently:  9/10 Onset quality:  Sudden Duration:  2 days Progression:  Worsening Chronicity:  New Similar to prior headaches: no   Associated symptoms: no abdominal pain, no blurred vision, no dizziness, no fever, no nausea, no near-syncope, no numbness and no vomiting     Past Medical History  Diagnosis Date  . Bipolar 1 disorder   . Coronary artery disease   . Hypertension   . Stroke   . MI, old    Past Surgical History  Procedure Laterality Date  . Breast surgery    . Neck surgery     History reviewed. No pertinent family history. History  Substance Use Topics  . Smoking status: Never Smoker   . Smokeless tobacco: Not on file  . Alcohol Use: No   OB History    No data available     Review of Systems  Constitutional: Negative.  Negative for fever.  Eyes: Negative for blurred vision.  Respiratory: Negative.   Cardiovascular: Negative.  Negative for near-syncope.  Gastrointestinal: Negative for nausea, vomiting and abdominal pain.  Neurological: Positive for headaches. Negative for dizziness, light-headedness and numbness.       Memory deficit.    Allergies  Augmentin  Home Medications   Prior to Admission medications   Medication Sig Start Date End Date Taking? Authorizing Provider  ALPRAZolam Duanne Moron) 1 MG tablet Take 1 mg by mouth as needed for anxiety.    Historical Provider, MD  asenapine (SAPHRIS) 5 MG SUBL 24 hr tablet Place 5 mg under the tongue at bedtime.    Historical Provider,  MD  aspirin 81 MG chewable tablet Chew 81 mg by mouth daily.    Historical Provider, MD  atorvastatin (LIPITOR) 80 MG tablet Take 80 mg by mouth daily.    Historical Provider, MD  clopidogrel (PLAVIX) 75 MG tablet Take 75 mg by mouth daily.    Historical Provider, MD  ferrous sulfate 325 (65 FE) MG tablet Take 325 mg by mouth daily with breakfast.    Historical Provider, MD  Fluticasone Furoate-Vilanterol 200-25 MCG/INH AEPB Inhale 1 puff into the lungs every morning.    Historical Provider, MD  fluticasone-salmeterol (ADVAIR HFA) 115-21 MCG/ACT inhaler Inhale 1 puff into the lungs every morning.    Historical Provider, MD  furosemide (LASIX) 40 MG tablet Take 40 mg by mouth every morning. May take 2 tabs if retaining fluid    Historical Provider, MD  gabapentin (NEURONTIN) 300 MG capsule Take 300 mg by mouth 3 (three) times daily.    Historical Provider, MD  isosorbide mononitrate (IMDUR) 30 MG 24 hr tablet Take 30 mg by mouth every morning.    Historical Provider, MD  lamoTRIgine (LAMICTAL) 200 MG tablet Take 200 mg by mouth every evening.    Historical Provider, MD  metoprolol tartrate (LOPRESSOR) 25 MG tablet Take 25 mg by mouth 2 (two) times daily.    Historical Provider, MD  naproxen (NAPROSYN) 500 MG tablet Take 500 mg by mouth 2 (two) times daily as needed for mild pain.  Historical Provider, MD  nitroGLYCERIN (NITROSTAT) 0.4 MG SL tablet Place 0.4 mg under the tongue every 5 (five) minutes as needed for chest pain.    Historical Provider, MD  potassium chloride (K-DUR,KLOR-CON) 10 MEQ tablet Take 10 mEq by mouth 3 (three) times daily.    Historical Provider, MD   BP 132/58 mmHg  Pulse 62  Temp(Src) 97.2 F (36.2 C) (Axillary)  Resp 16  SpO2 99% Physical Exam  Constitutional: She is oriented to person, place, and time. She appears well-developed and well-nourished.  HENT:  Head: Normocephalic.  Right Ear: External ear normal.  Left Ear: External ear normal.  Mouth/Throat:  Oropharynx is clear and moist.  Eyes: Conjunctivae and EOM are normal. Pupils are equal, round, and reactive to light.  Neck: Normal range of motion. Neck supple.  Cardiovascular: Regular rhythm and normal heart sounds.   Pulmonary/Chest: Breath sounds normal.  Musculoskeletal: Normal range of motion. She exhibits no edema.  Lymphadenopathy:    She has no cervical adenopathy.  Neurological: She is alert and oriented to person, place, and time. No cranial nerve deficit.  Skin: Skin is warm and dry.  Nursing note and vitals reviewed.   ED Course  Procedures (including critical care time) Labs Review Labs Reviewed - No data to display  Imaging Review No results found.   MDM   1. Headache in back of head    Sent for eval of left parietal headache , worst ever, getting worse, assoc memory loss, h/o cva, cad, pvd, on plavix., no n/v. Or visual diff.    Billy Fischer, MD 06/07/14 1136

## 2014-06-07 NOTE — Consult Note (Signed)
NEURO HOSPITALIST CONSULT NOTE    Reason for Consult: HA  HPI:                                                                                                                                          Kathryn Lewis is an 69 y.o. female with history of right temporal/parietal/occipital infarct which has left her with a left hemianopsia, reading difficulty and decreased memory.  She has known bipolar and per daughter has just been started on Neudexta in the past week.  She has been doing well until 3 days ago.  Three days ago she noted she had pain in her left occipital region that would come and go.  When it hit her she states it was like a sharp quick pain. Yesterday it was more frequent and more painful.  Today it was more frequent.  IT is not throbbing or lancating but described as a sharp, localized brief pain that has no pain in between. She denies history of HA. She denies any neurological complications. She has been given Zofran, compazine and benadryl with no relief.  She has also been given Dilaudid which is relaxing her but again not helping with the sharp intermittent pain.   Past Medical History  Diagnosis Date  . Bipolar 1 disorder   . Coronary artery disease   . Hypertension   . Stroke   . MI, old     Past Surgical History  Procedure Laterality Date  . Breast surgery    . Neck surgery      Family History  Problem Relation Age of Onset  . Hypertension Mother   . Hypertension Father      Social History:  reports that she has never smoked. She does not have any smokeless tobacco history on file. She reports that she does not drink alcohol. Her drug history is not on file.  Allergies  Allergen Reactions  . Amoxicillin Other (See Comments)    colitis  . Augmentin [Amoxicillin-Pot Clavulanate]     colitis  . Cephalexin Other (See Comments)    colitis  . Erythromycin Other (See Comments)    colitis  . Niacin And Related Other (See Comments)     colitis  . Other     MEDICATIONS:  Current Facility-Administered Medications  Medication Dose Route Frequency Provider Last Rate Last Dose  . carbamazepine (TEGRETOL XR) 12 hr tablet 200 mg  200 mg Oral NOW Marliss Coots, PA-C      . Derrill Memo ON 06/08/2014] carbamazepine (TEGRETOL XR) 12 hr tablet 200 mg  200 mg Oral BID Marliss Coots, PA-C       Current Outpatient Prescriptions  Medication Sig Dispense Refill  . ALPRAZolam (XANAX) 1 MG tablet Take 1 mg by mouth at bedtime as needed for anxiety.     Marland Kitchen asenapine (SAPHRIS) 5 MG SUBL 24 hr tablet Place 5 mg under the tongue 2 (two) times daily.    Marland Kitchen aspirin 81 MG chewable tablet Chew 81 mg by mouth daily.    Marland Kitchen atorvastatin (LIPITOR) 80 MG tablet Take 80 mg by mouth daily.    . clopidogrel (PLAVIX) 75 MG tablet Take 75 mg by mouth daily.    . ferrous sulfate 325 (65 FE) MG tablet Take 325 mg by mouth daily with breakfast.    . Fluticasone Furoate-Vilanterol 200-25 MCG/INH AEPB Inhale 1 puff into the lungs every morning.    . fluticasone-salmeterol (ADVAIR HFA) 115-21 MCG/ACT inhaler Inhale 1 puff into the lungs every morning.    . furosemide (LASIX) 40 MG tablet Take 40 mg by mouth every morning. May take 2 tabs if retaining fluid    . gabapentin (NEURONTIN) 100 MG capsule Take 100 mg by mouth 2 (two) times daily.  1  . isosorbide mononitrate (IMDUR) 30 MG 24 hr tablet Take 30 mg by mouth every morning.    . lamoTRIgine (LAMICTAL) 200 MG tablet Take 200 mg by mouth every evening.    . metoprolol tartrate (LOPRESSOR) 25 MG tablet Take 25 mg by mouth 2 (two) times daily.    . naproxen (NAPROSYN) 500 MG tablet Take 500 mg by mouth 2 (two) times daily as needed for mild pain.    . nitroGLYCERIN (NITROSTAT) 0.4 MG SL tablet Place 0.4 mg under the tongue every 5 (five) minutes as needed for chest pain.    Marland Kitchen NUEDEXTA 20-10 MG CAPS Take 1  tablet by mouth daily.  0  . potassium chloride (K-DUR,KLOR-CON) 10 MEQ tablet Take 10 mEq by mouth 3 (three) times daily.    . VENTOLIN HFA 108 (90 BASE) MCG/ACT inhaler Inhale 2 puffs into the lungs every 4 (four) hours as needed. wheezing  0      ROS:                                                                                                                                       History obtained from daughter  General ROS: negative for - chills, fatigue, fever, night sweats, weight gain or weight loss Psychological ROS: negative for - behavioral disorder, hallucinations, memory difficulties, mood swings or suicidal ideation Ophthalmic ROS: negative for - blurry vision, double vision, eye  pain or loss of vision ENT ROS: negative for - epistaxis, nasal discharge, oral lesions, sore throat, tinnitus or vertigo Allergy and Immunology ROS: negative for - hives or itchy/watery eyes Hematological and Lymphatic ROS: negative for - bleeding problems, bruising or swollen lymph nodes Endocrine ROS: negative for - galactorrhea, hair pattern changes, polydipsia/polyuria or temperature intolerance Respiratory ROS: negative for - cough, hemoptysis, shortness of breath or wheezing Cardiovascular ROS: negative for - chest pain, dyspnea on exertion, edema or irregular heartbeat Gastrointestinal ROS: negative for - abdominal pain, diarrhea, hematemesis, nausea/vomiting or stool incontinence Genito-Urinary ROS: negative for - dysuria, hematuria, incontinence or urinary frequency/urgency Musculoskeletal ROS: negative for - joint swelling or muscular weakness Neurological ROS: as noted in HPI Dermatological ROS: negative for rash and skin lesion changes   Blood pressure 107/60, pulse 62, temperature 98 F (36.7 C), temperature source Oral, resp. rate 15, SpO2 98 %.   Neurologic Examination:                                                                                                      HEENT-   Normocephalic, no lesions, without obvious abnormality.  Normal external eye and conjunctiva.  Normal TM's bilaterally.  Normal auditory canals and external ears. Normal external nose, mucus membranes and septum.  Normal pharynx. Cardiovascular- S1, S2 normal, pulses palpable throughout   Lungs- chest clear, no wheezing, rales, normal symmetric air entry Abdomen- normal findings: bowel sounds normal Extremities- no edema Lymph-no adenopathy palpable Musculoskeletal-no joint tenderness, deformity or swelling Skin-warm and dry, no hyperpigmentation, vitiligo, or suspicious lesions  Neurological Examination Mental Status: Alert, oriented, thought content appropriate.  Speech fluent without evidence of aphasia.  Able to follow 3 step commands without difficulty. Cranial Nerves: II: Discs flat bilaterally; left hemianopsia normal, pupils equal, round, reactive to light and accommodation III,IV, VI: ptosis not present, extra-ocular motions intact bilaterally V,VII: smile symmetric, facial light touch sensation normal bilaterally VIII: hearing normal bilaterally IX,X: uvula rises symmetrically XI: bilateral shoulder shrug XII: midline tongue extension Motor: Right : Upper extremity   5/5    Left:     Upper extremity   5/5  Lower extremity   5/5     Lower extremity   5/5 Tone and bulk:normal tone throughout; no atrophy noted Sensory: Pinprick and light touch intact throughout, bilaterally Deep Tendon Reflexes: 2+ and symmetric throughout Plantars: Right: downgoing   Left: downgoing Cerebellar: normal finger-to-nose, normal heel-to-shin test Gait: not tested due to multiple leads.       Lab Results: Basic Metabolic Panel:  Recent Labs Lab 06/07/14 1236  NA 138  K 4.5  CL 101  CO2 26  GLUCOSE 99  BUN 20  CREATININE 0.92  CALCIUM 9.4    Liver Function Tests: No results for input(s): AST, ALT, ALKPHOS, BILITOT, PROT, ALBUMIN in the last 168 hours. No results for input(s):  LIPASE, AMYLASE in the last 168 hours. No results for input(s): AMMONIA in the last 168 hours.  CBC:  Recent Labs Lab 06/07/14 1236  WBC 8.3  NEUTROABS 5.6  HGB 11.5*  HCT  35.9*  MCV 80.9  PLT 234    Cardiac Enzymes: No results for input(s): CKTOTAL, CKMB, CKMBINDEX, TROPONINI in the last 168 hours.  Lipid Panel: No results for input(s): CHOL, TRIG, HDL, CHOLHDL, VLDL, LDLCALC in the last 168 hours.  CBG: No results for input(s): GLUCAP in the last 168 hours.  Microbiology: No results found for this or any previous visit.  Coagulation Studies: No results for input(s): LABPROT, INR in the last 72 hours.  Imaging: Ct Head Wo Contrast  06/07/2014   CLINICAL DATA:  Stabbing headache to left-sided head 2 days.  EXAM: CT HEAD WITHOUT CONTRAST  TECHNIQUE: Contiguous axial images were obtained from the base of the skull through the vertex without intravenous contrast.  COMPARISON:  None.  FINDINGS: Large region of encephalomalacia involving the right parietal, occipital, and temporal lobes. There is accompanying ex vacuo dilatation the adjacent right ventricle atria.  No acute intracranial hemorrhage. No focal mass lesion. No CT evidence of acute infarction. No midline shift or mass effect. No hydrocephalus. Basilar cisterns are patent.  There is mild periventricular white matter hypodensities.  Insert clear mastoids  IMPRESSION: 1. No acute intracranial findings. 2. Large posterior infarction involving the right temporal, parietal, and occipital lobes. 3. Mild periventricular white matter microvascular disease.   Electronically Signed   By: Suzy Bouchard M.D.   On: 06/07/2014 13:31       Assessment and plan per attending neurologist  Etta Quill PA-C [facility] [phone]  06/07/2014, 4:42 PM   Assessment/Plan: Paroxysmal unilateral occipital headaches. This is most consistent with occipital nerualgia. I would favor ruling out acute dissection, but if CTA  were negative, then would treat with carbamazepine.   1) CTA head and neck  2) If no dissection, would start tegretol 200mg  BID and follow up with neurology as an outpatient.   Roland Rack, MD [facility] [phone]  If 7pm- 7am, please page neurology on call as listed in [place].

## 2014-06-11 LAB — CSF CULTURE W GRAM STAIN: Culture: NO GROWTH

## 2014-06-11 LAB — CSF CULTURE

## 2015-02-18 ENCOUNTER — Emergency Department (INDEPENDENT_AMBULATORY_CARE_PROVIDER_SITE_OTHER)
Admission: EM | Admit: 2015-02-18 | Discharge: 2015-02-18 | Disposition: A | Discharge status: Home / Self Care | Payer: Medicare Other | Source: Home / Self Care | Attending: Emergency Medicine | Admitting: Emergency Medicine

## 2015-02-18 ENCOUNTER — Encounter (HOSPITAL_COMMUNITY): Payer: Self-pay | Admitting: Emergency Medicine

## 2015-02-18 DIAGNOSIS — R197 Diarrhea, unspecified: Secondary | ICD-10-CM

## 2015-02-18 DIAGNOSIS — E860 Dehydration: Secondary | ICD-10-CM

## 2015-02-18 LAB — POCT URINALYSIS DIP (DEVICE)
Bilirubin Urine: NEGATIVE
GLUCOSE, UA: NEGATIVE mg/dL
KETONES UR: NEGATIVE mg/dL
Leukocytes, UA: NEGATIVE
Nitrite: NEGATIVE
PH: 7 (ref 5.0–8.0)
PROTEIN: NEGATIVE mg/dL
SPECIFIC GRAVITY, URINE: 1.015 (ref 1.005–1.030)
UROBILINOGEN UA: 0.2 mg/dL (ref 0.0–1.0)

## 2015-02-18 LAB — POCT I-STAT, CHEM 8
BUN: 11 mg/dL (ref 6–20)
CALCIUM ION: 1.26 mmol/L (ref 1.13–1.30)
CREATININE: 0.7 mg/dL (ref 0.44–1.00)
Chloride: 99 mmol/L — ABNORMAL LOW (ref 101–111)
GLUCOSE: 89 mg/dL (ref 65–99)
HEMATOCRIT: 41 % (ref 36.0–46.0)
HEMOGLOBIN: 13.9 g/dL (ref 12.0–15.0)
Potassium: 4 mmol/L (ref 3.5–5.1)
Sodium: 139 mmol/L (ref 135–145)
TCO2: 28 mmol/L (ref 0–100)

## 2015-02-18 MED ORDER — SODIUM CHLORIDE 0.9 % IV BOLUS (SEPSIS)
1000.0000 mL | Freq: Once | INTRAVENOUS | Status: AC
  Administered 2015-02-18: 1000 mL via INTRAVENOUS

## 2015-02-18 NOTE — ED Notes (Signed)
C/o loose stools onset x7 days associated w/weakness and confusion reported by daughter Reports x2 episodes/day of loose stools... Hx of colitis  Denies fevers, chills, abd pain, nausea, emesis A&O x4... No acute distress.

## 2015-02-18 NOTE — ED Provider Notes (Signed)
CSN: [id number]     Arrival date & time 02/18/15  1301 History   First MD Initiated Contact with Patient 02/18/15 1350     Chief Complaint  Patient presents with  . Diarrhea   (Consider location/radiation/quality/duration/timing/severity/associated sxs/prior Treatment) HPI Patient with a seven-day history of constant diarrhea. She states that she has been using Imodium at home with some improvement of her diarrhea but she still has 3-4 diarrhea stools daily. She states that she is starting to feel dehydrated. Not able to keep up with her fluid intake. No lightheadedness or dizziness. Past Medical History  Diagnosis Date  . Bipolar 1 disorder ([place])   . Coronary artery disease   . Hypertension   . Stroke ([place])   . MI, old    Past Surgical History  Procedure Laterality Date  . Breast surgery    . Neck surgery     Family History  Problem Relation Age of Onset  . Hypertension Mother   . Hypertension Father    Social History  Substance Use Topics  . Smoking status: Never Smoker   . Smokeless tobacco: None  . Alcohol Use: No   OB History    No data available     Review of Systems ROS +'ve diarrhea, dehydration  Denies: HEADACHE, NAUSEA, ABDOMINAL PAIN, CHEST PAIN, CONGESTION, DYSURIA, SHORTNESS OF BREATH, NO BLOOD IN STOOL  Allergies  Amoxicillin; Augmentin; Cephalexin; Erythromycin; Niacin and related; and Other  Home Medications   Prior to Admission medications   Medication Sig Start Date End Date Taking? Authorizing Provider  aspirin 81 MG chewable tablet Chew 81 mg by mouth daily.   Yes Historical Provider, MD  clopidogrel (PLAVIX) 75 MG tablet Take 75 mg by mouth daily.   Yes Historical Provider, MD  ferrous sulfate 325 (65 FE) MG tablet Take 325 mg by mouth daily with breakfast.   Yes Historical Provider, MD  furosemide (LASIX) 40 MG tablet Take 40 mg by mouth every morning. May take 2 tabs if retaining fluid   Yes Historical Provider, MD  gabapentin (NEURONTIN)  100 MG capsule Take 100 mg by mouth 2 (two) times daily. 05/20/14  Yes Historical Provider, MD  isosorbide mononitrate (IMDUR) 30 MG 24 hr tablet Take 30 mg by mouth every morning.   Yes Historical Provider, MD  lamoTRIgine (LAMICTAL) 200 MG tablet Take 200 mg by mouth every evening.   Yes Historical Provider, MD  metoprolol tartrate (LOPRESSOR) 25 MG tablet Take 25 mg by mouth 2 (two) times daily.   Yes Historical Provider, MD  potassium chloride (K-DUR,KLOR-CON) 10 MEQ tablet Take 10 mEq by mouth 3 (three) times daily.   Yes Historical Provider, MD  ALPRAZolam Duanne Moron) 1 MG tablet Take 1 mg by mouth at bedtime as needed for anxiety.     Historical Provider, MD  asenapine (SAPHRIS) 5 MG SUBL 24 hr tablet Place 5 mg under the tongue 2 (two) times daily.    Historical Provider, MD  atorvastatin (LIPITOR) 80 MG tablet Take 80 mg by mouth daily.    Historical Provider, MD  carbamazepine (TEGRETOL) 200 MG tablet Take 1 tablet (200 mg total) by mouth 2 (two) times daily. 06/07/14   Lajean Saver, MD  Fluticasone Furoate-Vilanterol 200-25 MCG/INH AEPB Inhale 1 puff into the lungs every morning.    Historical Provider, MD  fluticasone-salmeterol (ADVAIR HFA) 115-21 MCG/ACT inhaler Inhale 1 puff into the lungs every morning.    Historical Provider, MD  naproxen (NAPROSYN) 500 MG tablet Take 500 mg by  mouth 2 (two) times daily as needed for mild pain.    Historical Provider, MD  nitroGLYCERIN (NITROSTAT) 0.4 MG SL tablet Place 0.4 mg under the tongue every 5 (five) minutes as needed for chest pain.    Historical Provider, MD  NUEDEXTA 20-10 MG CAPS Take 1 tablet by mouth daily. 05/28/14   Historical Provider, MD  VENTOLIN HFA 108 (90 BASE) MCG/ACT inhaler Inhale 2 puffs into the lungs every 4 (four) hours as needed. wheezing 06/04/14   Historical Provider, MD   Meds Ordered and Administered this Visit   Medications  sodium chloride 0.9 % bolus 1,000 mL (not administered)    BP 152/59 mmHg  Pulse 60   Temp(Src) 98 F (36.7 C) (Oral)  Resp 18  SpO2 100% No data found.   Physical Exam  Constitutional: She is oriented to person, place, and time. She appears well-developed and well-nourished. No distress.  HENT:  Head: Normocephalic and atraumatic.  Mouth/Throat: Mucous membranes are dry.  Eyes: Conjunctivae are normal.  Pulmonary/Chest: Effort normal and breath sounds normal.  Abdominal: Soft. There is no tenderness. There is no rebound and no guarding.  Neurological: She is alert and oriented to person, place, and time.  Skin: Skin is warm and dry.  Psychiatric: She has a normal mood and affect. Her behavior is normal. Judgment and thought content normal.  Nursing note and vitals reviewed.   ED Course  Procedures (including critical care time)  Labs Review Labs Reviewed  POCT URINALYSIS DIP (DEVICE) - Abnormal; Notable for the following:    Hgb urine dipstick TRACE (*)    All other components within normal limits  POCT I-STAT, CHEM 8 - Abnormal; Notable for the following:    Chloride 99 (*)    All other components within normal limits  GASTROINTESTINAL PANEL BY PCR, STOOL (REPLACES STOOL CULTURE)    Imaging Review No results found.   Visual Acuity Review  Right Eye Distance:   Left Eye Distance:   Bilateral Distance:    Right Eye Near:   Left Eye Near:    Bilateral Near:         MDM   1. Diarrhea, unspecified type   2. Dehydration     We'll attempt rehydration here in urgent care with IV fluid. Patient is a received 1 L of normal saline fluid over the next couple hours. Reassessment will be done at that time. She is aware that this she is feeling better she may be able to go home but if not she needed to go to the emergency department for further evaluation. I-STAT 8 and urinalysis is pending at this time. Labs reviewed with patient. She is given 2 L of normal saline and is feeling much better at this time. She states that she will return stool cultures  tomorrow. Orders for stool cultures are in the computer. She is advised to continue light diet. There is no indication to prescribe Imodium at this time. Instructions of care provided discharged home in stable improved condition    Konrad Felix, [place] 02/18/15 1726

## 2015-02-18 NOTE — ED Notes (Signed)
Pt finished IV bolus... reports she is feeling better... Notified Lucita Lora, [place]

## 2015-02-18 NOTE — ED Notes (Signed)
x2 IV unsuccessful attempts.... Asked Venetia Night, RN to assist.

## 2015-02-18 NOTE — Discharge Instructions (Signed)
Diarrhea  Diarrhea is watery poop (stool). It can make you feel weak, tired, thirsty, or give you a dry mouth (signs of dehydration). Watery poop is a sign of another problem, most often an infection. It often lasts 2-3 days. It can last longer if it is a sign of something serious. Take care of yourself as told by your doctor.  HOME CARE   · Drink 1 cup (8 ounces) of fluid each time you have watery poop.  · Do not drink the following fluids:    Those that contain simple sugars (fructose, glucose, galactose, lactose, sucrose, maltose).    Sports drinks.    Fruit juices.    Whole milk products.    Sodas.    Drinks with caffeine (coffee, tea, soda) or alcohol.  · Oral rehydration solution may be used if the doctor says it is okay. You may make your own solution. Follow this recipe:    ?-? teaspoon table salt.    ¾ teaspoon baking soda.    ? teaspoon salt substitute containing potassium chloride.    1 ? tablespoons sugar.    1 liter (34 ounces) of water.  · Avoid the following foods:    High fiber foods, such as raw fruits and vegetables.    Nuts, seeds, and whole grain breads and cereals.     Those that are sweetened with sugar alcohols (xylitol, sorbitol, mannitol).  · Try eating the following foods:    Starchy foods, such as rice, toast, pasta, low-sugar cereal, oatmeal, baked potatoes, crackers, and bagels.    Bananas.    Applesauce.  · Eat probiotic-rich foods, such as yogurt and milk products that are fermented.  · Wash your hands well after each time you have watery poop.  · Only take medicine as told by your doctor.  · Take a warm bath to help lessen burning or pain from having watery poop.  GET HELP RIGHT AWAY IF:   · You cannot drink fluids without throwing up (vomiting).  · You keep throwing up.  · You have blood in your poop, or your poop looks black and tarry.  · You do not pee (urinate) in 6-8 hours, or there is only a small amount of very dark pee.  · You have belly (abdominal) pain that gets worse or  stays in the same spot (localizes).  · You are weak, dizzy, confused, or light-headed.  · You have a very bad headache.  · Your watery poop gets worse or does not get better.  · You have a fever or lasting symptoms for more than 2-3 days.  · You have a fever and your symptoms suddenly get worse.  MAKE SURE YOU:   · Understand these instructions.  · Will watch your condition.  · Will get help right away if you are not doing well or get worse.     This information is not intended to replace advice given to you by your health care provider. Make sure you discuss any questions you have with your health care provider.     Document Released: 06/30/2007 Document Revised: 02/01/2014 Document Reviewed: 09/19/2011  Elsevier Interactive Patient Education ©2016 [employer]

## 2015-02-21 ENCOUNTER — Telehealth (HOSPITAL_COMMUNITY): Payer: Self-pay | Admitting: Emergency Medicine

## 2015-02-21 LAB — GASTROINTESTINAL PANEL BY PCR, STOOL (REPLACES STOOL CULTURE)
ASTROVIRUS: NOT DETECTED
Adenovirus F40/41: NOT DETECTED
CAMPYLOBACTER SPECIES: NOT DETECTED
CRYPTOSPORIDIUM: NOT DETECTED
Cyclospora cayetanensis: NOT DETECTED
E. COLI O157: NOT DETECTED
ENTEROAGGREGATIVE E COLI (EAEC): NOT DETECTED
ENTEROPATHOGENIC E COLI (EPEC): NOT DETECTED
ENTEROTOXIGENIC E COLI (ETEC): NOT DETECTED
Entamoeba histolytica: NOT DETECTED
Giardia lamblia: NOT DETECTED
NOROVIRUS GI/GII: DETECTED — AB
Plesimonas shigelloides: NOT DETECTED
Rotavirus A: NOT DETECTED
SHIGELLA/ENTEROINVASIVE E COLI (EIEC): NOT DETECTED
Salmonella species: NOT DETECTED
Sapovirus (I, II, IV, and V): NOT DETECTED
Shiga like toxin producing E coli (STEC): NOT DETECTED
VIBRIO CHOLERAE: NOT DETECTED
Vibrio species: NOT DETECTED
Yersinia enterocolitica: NOT DETECTED

## 2015-02-21 NOTE — ED Notes (Signed)
Lab called in re to lab results.... Reports pt was pos for the Norovirus Notified Lucita Lora, PA.

## 2015-02-24 NOTE — ED Notes (Signed)
Pt calling today to check results of stool sample she brought in.  Pt was positive for the Norovirus and she was instructed to keep her fluid intake up and instructed on signs of dehydration.  She will return if she has any of these signs or is not feeling better.

## 2015-04-16 ENCOUNTER — Emergency Department (HOSPITAL_COMMUNITY): Payer: Medicare Other

## 2015-04-16 ENCOUNTER — Inpatient Hospital Stay (HOSPITAL_COMMUNITY)
Admission: EM | Admit: 2015-04-16 | Discharge: 2015-04-18 | DRG: 305 | Disposition: A | Discharge status: Home / Self Care | Payer: Medicare Other | Attending: Internal Medicine | Admitting: Internal Medicine

## 2015-04-16 ENCOUNTER — Encounter (HOSPITAL_COMMUNITY): Payer: Self-pay

## 2015-04-16 DIAGNOSIS — I252 Old myocardial infarction: Secondary | ICD-10-CM | POA: Diagnosis not present

## 2015-04-16 DIAGNOSIS — G40909 Epilepsy, unspecified, not intractable, without status epilepticus: Secondary | ICD-10-CM | POA: Diagnosis present

## 2015-04-16 DIAGNOSIS — F319 Bipolar disorder, unspecified: Secondary | ICD-10-CM | POA: Diagnosis present

## 2015-04-16 DIAGNOSIS — Z7902 Long term (current) use of antithrombotics/antiplatelets: Secondary | ICD-10-CM | POA: Diagnosis not present

## 2015-04-16 DIAGNOSIS — I69898 Other sequelae of other cerebrovascular disease: Secondary | ICD-10-CM | POA: Diagnosis not present

## 2015-04-16 DIAGNOSIS — I200 Unstable angina: Secondary | ICD-10-CM

## 2015-04-16 DIAGNOSIS — Z7982 Long term (current) use of aspirin: Secondary | ICD-10-CM | POA: Diagnosis not present

## 2015-04-16 DIAGNOSIS — Z8249 Family history of ischemic heart disease and other diseases of the circulatory system: Secondary | ICD-10-CM | POA: Diagnosis not present

## 2015-04-16 DIAGNOSIS — I69398 Other sequelae of cerebral infarction: Secondary | ICD-10-CM

## 2015-04-16 DIAGNOSIS — Z79899 Other long term (current) drug therapy: Secondary | ICD-10-CM

## 2015-04-16 DIAGNOSIS — I2510 Atherosclerotic heart disease of native coronary artery without angina pectoris: Secondary | ICD-10-CM | POA: Diagnosis present

## 2015-04-16 DIAGNOSIS — I25118 Atherosclerotic heart disease of native coronary artery with other forms of angina pectoris: Secondary | ICD-10-CM | POA: Diagnosis present

## 2015-04-16 DIAGNOSIS — D3500 Benign neoplasm of unspecified adrenal gland: Secondary | ICD-10-CM | POA: Diagnosis present

## 2015-04-16 DIAGNOSIS — Z881 Allergy status to other antibiotic agents status: Secondary | ICD-10-CM | POA: Diagnosis not present

## 2015-04-16 DIAGNOSIS — Z888 Allergy status to other drugs, medicaments and biological substances status: Secondary | ICD-10-CM

## 2015-04-16 DIAGNOSIS — I25110 Atherosclerotic heart disease of native coronary artery with unstable angina pectoris: Secondary | ICD-10-CM | POA: Diagnosis present

## 2015-04-16 DIAGNOSIS — I160 Hypertensive urgency: Secondary | ICD-10-CM | POA: Diagnosis present

## 2015-04-16 DIAGNOSIS — I739 Peripheral vascular disease, unspecified: Secondary | ICD-10-CM | POA: Diagnosis present

## 2015-04-16 HISTORY — DX: Occlusion and stenosis of unspecified carotid artery: I65.29

## 2015-04-16 HISTORY — DX: Other specified disorders of adrenal gland: E27.8

## 2015-04-16 HISTORY — DX: Peripheral vascular disease, unspecified: I73.9

## 2015-04-16 LAB — CBC
HCT: 35.3 % — ABNORMAL LOW (ref 36.0–46.0)
Hemoglobin: 11.5 g/dL — ABNORMAL LOW (ref 12.0–15.0)
MCH: 29.4 pg (ref 26.0–34.0)
MCHC: 32.6 g/dL (ref 30.0–36.0)
MCV: 90.3 fL (ref 78.0–100.0)
PLATELETS: 243 10*3/uL (ref 150–400)
RBC: 3.91 MIL/uL (ref 3.87–5.11)
RDW: 14.9 % (ref 11.5–15.5)
WBC: 7.6 10*3/uL (ref 4.0–10.5)

## 2015-04-16 LAB — PROTIME-INR
INR: 0.93 (ref 0.00–1.49)
Prothrombin Time: 12.7 seconds (ref 11.6–15.2)

## 2015-04-16 LAB — I-STAT TROPONIN, ED: TROPONIN I, POC: 0.01 ng/mL (ref 0.00–0.08)

## 2015-04-16 MED ORDER — NITROGLYCERIN 2 % TD OINT
1.0000 [in_us] | TOPICAL_OINTMENT | Freq: Once | TRANSDERMAL | Status: AC
  Administered 2015-04-16: 1 [in_us] via TOPICAL
  Filled 2015-04-16: qty 1

## 2015-04-16 NOTE — ED Notes (Signed)
Pt arrived by [facility] for left arm pain and high blood pressure. Pts BP with EMS was 230/90 and came down to 180/90 after 3 nitros. Pt also is now pain free after nitro and 324ASA.Marland Kitchen Pt denies N/V.

## 2015-04-16 NOTE — ED Provider Notes (Signed)
CSN: [id number]     Arrival date & time 04/16/15  2209 History   First MD Initiated Contact with Patient 04/16/15 2223     Chief Complaint  Patient presents with  . Hypertension  . Arm Pain    Left      (Consider location/radiation/quality/duration/timing/severity/associated sxs/prior Treatment) HPI Comments: Patient with history of coronary artery disease managed medically, previous MI, peripheral artery disease on Plavix, previous stroke, history of seizure disorder -- presents with complaint of left arm pain which started yesterday. Patient feels pain from her shoulder to her elbow. She denies any chest pain, shortness breath, diaphoresis, nausea or vomiting. Patient mentioned these symptoms to her daughter tonight when her daughter noticed her holding her arm. They drove to a fire station and blood pressure was noted to be very high. On EMS arrival, BP was 230/90. Patient was given 3 nitros which helped improve the pain and pain is now resolved. She is also given aspirin. The onset of this condition was acute. The course is constant. Aggravating factors: none. Alleviating factors: none.    The history is provided by the patient and medical records.    Past Medical History  Diagnosis Date  . Bipolar 1 disorder ([place])   . Coronary artery disease   . Hypertension   . Stroke ([place])   . MI, old    Past Surgical History  Procedure Laterality Date  . Breast surgery    . Neck surgery     Family History  Problem Relation Age of Onset  . Hypertension Mother   . Hypertension Father    Social History  Substance Use Topics  . Smoking status: Never Smoker   . Smokeless tobacco: None  . Alcohol Use: No   OB History    No data available     Review of Systems  Constitutional: Negative for fever and diaphoresis.  Eyes: Negative for redness.  Respiratory: Negative for cough and shortness of breath.   Cardiovascular: Negative for chest pain, palpitations and leg swelling.       L arm  pain  Gastrointestinal: Negative for nausea, vomiting and abdominal pain.  Genitourinary: Negative for dysuria.  Musculoskeletal: Negative for back pain and neck pain.  Skin: Negative for rash.  Neurological: Negative for syncope and light-headedness.  Psychiatric/Behavioral: The patient is not nervous/anxious.     Allergies  Amoxicillin; Augmentin; Cephalexin; Erythromycin; Niacin and related; and Other  Home Medications   Prior to Admission medications   Medication Sig Start Date End Date Taking? Authorizing Provider  ALPRAZolam Duanne Moron) 1 MG tablet Take 1 mg by mouth at bedtime as needed for anxiety.     Historical Provider, MD  asenapine (SAPHRIS) 5 MG SUBL 24 hr tablet Place 5 mg under the tongue 2 (two) times daily.    Historical Provider, MD  aspirin 81 MG chewable tablet Chew 81 mg by mouth daily.    Historical Provider, MD  atorvastatin (LIPITOR) 80 MG tablet Take 80 mg by mouth daily.    Historical Provider, MD  carbamazepine (TEGRETOL) 200 MG tablet Take 1 tablet (200 mg total) by mouth 2 (two) times daily. 06/07/14   Lajean Saver, MD  clopidogrel (PLAVIX) 75 MG tablet Take 75 mg by mouth daily.    Historical Provider, MD  ferrous sulfate 325 (65 FE) MG tablet Take 325 mg by mouth daily with breakfast.    Historical Provider, MD  Fluticasone Furoate-Vilanterol 200-25 MCG/INH AEPB Inhale 1 puff into the lungs every morning.  Historical Provider, MD  fluticasone-salmeterol (ADVAIR HFA) 115-21 MCG/ACT inhaler Inhale 1 puff into the lungs every morning.    Historical Provider, MD  furosemide (LASIX) 40 MG tablet Take 40 mg by mouth every morning. May take 2 tabs if retaining fluid    Historical Provider, MD  gabapentin (NEURONTIN) 100 MG capsule Take 100 mg by mouth 2 (two) times daily. 05/20/14   Historical Provider, MD  isosorbide mononitrate (IMDUR) 30 MG 24 hr tablet Take 30 mg by mouth every morning.    Historical Provider, MD  lamoTRIgine (LAMICTAL) 200 MG tablet Take 200 mg  by mouth every evening.    Historical Provider, MD  metoprolol tartrate (LOPRESSOR) 25 MG tablet Take 25 mg by mouth 2 (two) times daily.    Historical Provider, MD  naproxen (NAPROSYN) 500 MG tablet Take 500 mg by mouth 2 (two) times daily as needed for mild pain.    Historical Provider, MD  nitroGLYCERIN (NITROSTAT) 0.4 MG SL tablet Place 0.4 mg under the tongue every 5 (five) minutes as needed for chest pain.    Historical Provider, MD  NUEDEXTA 20-10 MG CAPS Take 1 tablet by mouth daily. 05/28/14   Historical Provider, MD  potassium chloride (K-DUR,KLOR-CON) 10 MEQ tablet Take 10 mEq by mouth 3 (three) times daily.    Historical Provider, MD  VENTOLIN HFA 108 (90 BASE) MCG/ACT inhaler Inhale 2 puffs into the lungs every 4 (four) hours as needed. wheezing 06/04/14   Historical Provider, MD   BP 202/83 mmHg  Pulse 74  Resp 17  SpO2 100%   Physical Exam  Constitutional: She appears well-developed and well-nourished.  HENT:  Head: Normocephalic and atraumatic.  Mouth/Throat: Mucous membranes are normal. Mucous membranes are not dry.  Eyes: Conjunctivae are normal.  Neck: Trachea normal and normal range of motion. Neck supple. Normal carotid pulses and no JVD present. No muscular tenderness present. Carotid bruit is not present. No tracheal deviation present.  Cardiovascular: Normal rate, regular rhythm, S1 normal, S2 normal, normal heart sounds and intact distal pulses.  Exam reveals no decreased pulses.   No murmur heard. Pulmonary/Chest: Effort normal. No respiratory distress. She has no wheezes. She exhibits no tenderness.  Abdominal: Soft. Normal aorta and bowel sounds are normal. There is no tenderness. There is no rebound and no guarding.  Musculoskeletal: Normal range of motion.  Neurological: She is alert.  Skin: Skin is warm and dry. She is not diaphoretic. No cyanosis. No pallor.  Psychiatric: She has a normal mood and affect.  Nursing note and vitals reviewed.   ED Course   Procedures (including critical care time) Labs Review Labs Reviewed  BASIC METABOLIC PANEL - Abnormal; Notable for the following:    Glucose, Bld 115 (*)    All other components within normal limits  CBC - Abnormal; Notable for the following:    Hemoglobin 11.5 (*)    HCT 35.3 (*)    All other components within normal limits  PROTIME-INR  Randolm Idol, ED    Imaging Review Dg Chest 2 View  04/16/2015  CLINICAL DATA:  Left-sided shoulder and arm pain since yesterday. EXAM: CHEST  2 VIEW COMPARISON:  None. FINDINGS: Shallow inspiration. Normal heart size and pulmonary vascularity. No focal airspace disease or consolidation in the lungs. No blunting of costophrenic angles. No pneumothorax. Mediastinal contours appear intact. Postoperative changes in the cervical spine. IMPRESSION: No active cardiopulmonary disease. Electronically Signed   By: Lucienne Capers M.D.   On: 04/16/2015 23:13  I have personally reviewed and evaluated these images and lab results as part of my medical decision-making.   EKG Interpretation   Date/Time:  Wednesday April 16 2015 22:22:54 EDT Ventricular Rate:  72 PR Interval:  150 QRS Duration: 88 QT Interval:  360 QTC Calculation: 394 R Axis:   76 Text Interpretation:  Sinus rhythm Borderline repolarization abnormality  No significant change since last tracing Confirmed by KNAPP  MD-J, JON  [id number]) on 04/16/2015 10:39:43 PM       10:38 PM Patient seen and examined. Work-up initiated. Medications ordered - NTG paste for BP.   Vital signs reviewed and are as follows: BP 202/83 mmHg  Pulse 74  Resp 17  SpO2 100%  1:00 AM Initial work-up negative. Patient discussed with Dr. Billy Fischer. Will admit for CP obs. Pt is high risk with known CAD. HTN improved with NTG paste.   Pt stable, updated, agrees with plan to admit.   MDM   Final diagnoses:  Unstable angina (HCC)  Hypertensive urgency   Admit.    Carlisle Cater, PA-C 04/17/15  [id number]  Gareth Morgan, MD 04/20/15 2050

## 2015-04-16 NOTE — ED Notes (Signed)
Patient transported to X-ray 

## 2015-04-17 DIAGNOSIS — F319 Bipolar disorder, unspecified: Secondary | ICD-10-CM | POA: Diagnosis present

## 2015-04-17 DIAGNOSIS — I739 Peripheral vascular disease, unspecified: Secondary | ICD-10-CM | POA: Diagnosis present

## 2015-04-17 DIAGNOSIS — Z8249 Family history of ischemic heart disease and other diseases of the circulatory system: Secondary | ICD-10-CM | POA: Diagnosis not present

## 2015-04-17 DIAGNOSIS — I2510 Atherosclerotic heart disease of native coronary artery without angina pectoris: Secondary | ICD-10-CM

## 2015-04-17 DIAGNOSIS — Z7902 Long term (current) use of antithrombotics/antiplatelets: Secondary | ICD-10-CM | POA: Diagnosis not present

## 2015-04-17 DIAGNOSIS — I69398 Other sequelae of cerebral infarction: Secondary | ICD-10-CM

## 2015-04-17 DIAGNOSIS — D3500 Benign neoplasm of unspecified adrenal gland: Secondary | ICD-10-CM | POA: Diagnosis present

## 2015-04-17 DIAGNOSIS — I160 Hypertensive urgency: Secondary | ICD-10-CM | POA: Diagnosis not present

## 2015-04-17 DIAGNOSIS — I69898 Other sequelae of other cerebrovascular disease: Secondary | ICD-10-CM | POA: Diagnosis not present

## 2015-04-17 DIAGNOSIS — I25118 Atherosclerotic heart disease of native coronary artery with other forms of angina pectoris: Secondary | ICD-10-CM | POA: Diagnosis not present

## 2015-04-17 DIAGNOSIS — I252 Old myocardial infarction: Secondary | ICD-10-CM | POA: Diagnosis not present

## 2015-04-17 DIAGNOSIS — Z881 Allergy status to other antibiotic agents status: Secondary | ICD-10-CM | POA: Diagnosis not present

## 2015-04-17 DIAGNOSIS — G40909 Epilepsy, unspecified, not intractable, without status epilepticus: Secondary | ICD-10-CM | POA: Diagnosis present

## 2015-04-17 DIAGNOSIS — I200 Unstable angina: Secondary | ICD-10-CM | POA: Diagnosis present

## 2015-04-17 DIAGNOSIS — I25110 Atherosclerotic heart disease of native coronary artery with unstable angina pectoris: Secondary | ICD-10-CM | POA: Diagnosis present

## 2015-04-17 DIAGNOSIS — Z7982 Long term (current) use of aspirin: Secondary | ICD-10-CM | POA: Diagnosis not present

## 2015-04-17 DIAGNOSIS — Z888 Allergy status to other drugs, medicaments and biological substances status: Secondary | ICD-10-CM | POA: Diagnosis not present

## 2015-04-17 DIAGNOSIS — Z79899 Other long term (current) drug therapy: Secondary | ICD-10-CM | POA: Diagnosis not present

## 2015-04-17 HISTORY — DX: Atherosclerotic heart disease of native coronary artery without angina pectoris: I25.10

## 2015-04-17 LAB — BASIC METABOLIC PANEL
Anion gap: 10 (ref 5–15)
BUN: 19 mg/dL (ref 6–20)
CALCIUM: 9.8 mg/dL (ref 8.9–10.3)
CHLORIDE: 102 mmol/L (ref 101–111)
CO2: 28 mmol/L (ref 22–32)
CREATININE: 0.83 mg/dL (ref 0.44–1.00)
Glucose, Bld: 115 mg/dL — ABNORMAL HIGH (ref 65–99)
Potassium: 4.1 mmol/L (ref 3.5–5.1)
Sodium: 140 mmol/L (ref 135–145)

## 2015-04-17 LAB — TROPONIN I
Troponin I: <0.03 ng/mL (ref <0.031)
Troponin I: <0.03 ng/mL (ref <0.031)

## 2015-04-17 LAB — GLUCOSE, CAPILLARY: GLUCOSE-CAPILLARY: 113 mg/dL — AB (ref 65–99)

## 2015-04-17 MED ORDER — PHENYTOIN SODIUM EXTENDED 100 MG PO CAPS
300.0000 mg | ORAL_CAPSULE | Freq: Every day | ORAL | Status: DC
  Administered 2015-04-17 (×2): 300 mg via ORAL
  Filled 2015-04-17 (×2): qty 3

## 2015-04-17 MED ORDER — PHENYTOIN SODIUM EXTENDED 30 MG PO CAPS
30.0000 mg | ORAL_CAPSULE | Freq: Every day | ORAL | Status: DC
  Administered 2015-04-17 (×2): 30 mg via ORAL
  Filled 2015-04-17: qty 1

## 2015-04-17 MED ORDER — NITROGLYCERIN 0.4 MG SL SUBL: 0.4000 mg | SUBLINGUAL_TABLET | SUBLINGUAL | Status: DC | PRN

## 2015-04-17 MED ORDER — GABAPENTIN 100 MG PO CAPS
100.0000 mg | ORAL_CAPSULE | Freq: Two times a day (BID) | ORAL | Status: DC
  Administered 2015-04-17 – 2015-04-18 (×4): 100 mg via ORAL
  Filled 2015-04-17 (×4): qty 1

## 2015-04-17 MED ORDER — ISOSORBIDE MONONITRATE ER 30 MG PO TB24
30.0000 mg | ORAL_TABLET | Freq: Every day | ORAL | Status: DC
  Filled 2015-04-17: qty 1

## 2015-04-17 MED ORDER — HYDRALAZINE HCL 20 MG/ML IJ SOLN: 5.0000 mg | INTRAMUSCULAR | Status: DC | PRN

## 2015-04-17 MED ORDER — FLUOXETINE HCL 20 MG PO CAPS
40.0000 mg | ORAL_CAPSULE | Freq: Two times a day (BID) | ORAL | Status: DC
  Administered 2015-04-17 – 2015-04-18 (×2): 40 mg via ORAL
  Filled 2015-04-17 (×2): qty 2

## 2015-04-17 MED ORDER — METOPROLOL TARTRATE 25 MG PO TABS
25.0000 mg | ORAL_TABLET | Freq: Two times a day (BID) | ORAL | Status: DC
  Administered 2015-04-17 – 2015-04-18 (×4): 25 mg via ORAL
  Filled 2015-04-17 (×4): qty 1

## 2015-04-17 MED ORDER — ASPIRIN 81 MG PO CHEW
81.0000 mg | CHEWABLE_TABLET | Freq: Every day | ORAL | Status: DC
  Administered 2015-04-17 – 2015-04-18 (×2): 81 mg via ORAL
  Filled 2015-04-17 (×2): qty 1

## 2015-04-17 MED ORDER — FUROSEMIDE 40 MG PO TABS
40.0000 mg | ORAL_TABLET | Freq: Every morning | ORAL | Status: DC
  Administered 2015-04-17 – 2015-04-18 (×2): 40 mg via ORAL
  Filled 2015-04-17: qty 2
  Filled 2015-04-17: qty 1

## 2015-04-17 MED ORDER — POTASSIUM CHLORIDE CRYS ER 10 MEQ PO TBCR
10.0000 meq | EXTENDED_RELEASE_TABLET | Freq: Every day | ORAL | Status: DC
Start: 2015-04-17 — End: 2015-04-18
  Administered 2015-04-17 – 2015-04-18 (×2): 10 meq via ORAL
  Filled 2015-04-17 (×2): qty 1

## 2015-04-17 MED ORDER — SODIUM CHLORIDE 0.9% FLUSH
3.0000 mL | Freq: Two times a day (BID) | INTRAVENOUS | Status: DC
  Administered 2015-04-17 – 2015-04-18 (×2): 3 mL via INTRAVENOUS

## 2015-04-17 MED ORDER — ATORVASTATIN CALCIUM 80 MG PO TABS
80.0000 mg | ORAL_TABLET | Freq: Every day | ORAL | Status: DC
  Administered 2015-04-17 – 2015-04-18 (×2): 80 mg via ORAL
  Filled 2015-04-17 (×2): qty 1

## 2015-04-17 MED ORDER — ENOXAPARIN SODIUM 40 MG/0.4ML ~~LOC~~ SOLN
40.0000 mg | SUBCUTANEOUS | Status: DC
  Administered 2015-04-18: 40 mg via SUBCUTANEOUS
  Filled 2015-04-17 (×2): qty 0.4

## 2015-04-17 MED ORDER — NITROGLYCERIN 2 % TD OINT
1.0000 [in_us] | TOPICAL_OINTMENT | Freq: Four times a day (QID) | TRANSDERMAL | Status: DC
  Administered 2015-04-17 – 2015-04-18 (×6): 1 [in_us] via TOPICAL
  Filled 2015-04-17 (×5): qty 1

## 2015-04-17 MED ORDER — FLUOXETINE HCL 40 MG PO CAPS: 40.0000 mg | ORAL_CAPSULE | Freq: Two times a day (BID) | ORAL | Status: DC

## 2015-04-17 MED ORDER — CLOPIDOGREL BISULFATE 75 MG PO TABS
75.0000 mg | ORAL_TABLET | Freq: Every day | ORAL | Status: DC
  Administered 2015-04-17 – 2015-04-18 (×2): 75 mg via ORAL
  Filled 2015-04-17 (×3): qty 1

## 2015-04-17 MED ORDER — FERROUS SULFATE 325 (65 FE) MG PO TABS
325.0000 mg | ORAL_TABLET | Freq: Every day | ORAL | Status: DC
  Administered 2015-04-17 – 2015-04-18 (×2): 325 mg via ORAL
  Filled 2015-04-17 (×2): qty 1

## 2015-04-17 MED ORDER — VITAMIN D 1000 UNITS PO TABS
2000.0000 [IU] | ORAL_TABLET | Freq: Every morning | ORAL | Status: DC
  Administered 2015-04-17 – 2015-04-18 (×2): 2000 [IU] via ORAL
  Filled 2015-04-17 (×2): qty 2

## 2015-04-17 MED ORDER — ISOSORBIDE MONONITRATE ER 30 MG PO TB24: 30.0000 mg | ORAL_TABLET | Freq: Every day | ORAL | Status: DC

## 2015-04-17 MED ORDER — CARBAMAZEPINE 200 MG PO TABS
300.0000 mg | ORAL_TABLET | Freq: Two times a day (BID) | ORAL | Status: DC
  Administered 2015-04-17 – 2015-04-18 (×4): 300 mg via ORAL
  Filled 2015-04-17: qty 1.5
  Filled 2015-04-17 (×2): qty 2
  Filled 2015-04-17: qty 1.5

## 2015-04-17 MED ORDER — FLUVOXAMINE MALEATE 50 MG PO TABS
50.0000 mg | ORAL_TABLET | Freq: Every morning | ORAL | Status: DC
  Administered 2015-04-17 – 2015-04-18 (×2): 50 mg via ORAL
  Filled 2015-04-17 (×3): qty 1

## 2015-04-17 NOTE — H&P (Signed)
[facility] History and Physical  Kathryn Lewis [id number] DOB: July 30, 1945 DOA: 04/16/2015  Referring physician: EDP PCP: No primary care provider on file.   Chief Complaint: HTN, arm pain   HPI: Kathryn Lewis is a 70 y.o. female with h/o CAD, MI, PAD, HTN, prior stroke, seizure disorder following stroke.  Patient presents to the ED with c/o left arm pain onset yesterday.  Pain is from shoulder to elbow.  Patient mentioned symptoms to daughter tonight when daughter noticed patient holding her arm.  They drove to fire department where they took her BP and found it to be "very high".  EMS arrived to [facility] and BP was 230/90.  Patient given 3 NTG which improved pain and ultimately resolved it.  On arrival to ED BP was 202/83.  It has since improved to [id number] systolic after NTG ointment has been placed and her pain remains resolved.  Review of Systems: Systems reviewed.  As above, otherwise negative  Past Medical History  Diagnosis Date  . Bipolar 1 disorder ([place])   . Coronary artery disease   . Hypertension   . Stroke ([place])   . MI, old    Past Surgical History  Procedure Laterality Date  . Breast surgery    . Neck surgery     Social History:  reports that she has never smoked. She does not have any smokeless tobacco history on file. She reports that she does not drink alcohol. Her drug history is not on file.  Allergies  Allergen Reactions  . Amoxicillin Other (See Comments)    colitis  . Augmentin [Amoxicillin-Pot Clavulanate]     colitis  . Cephalexin Other (See Comments)    colitis  . Erythromycin Other (See Comments)    colitis  . Niacin And Related Other (See Comments)    colitis  . Other     Family History  Problem Relation Age of Onset  . Hypertension Mother   . Hypertension Father      Prior to Admission medications   Medication Sig Start Date End Date Taking? Authorizing Provider  aspirin 81 MG chewable tablet Chew 81 mg by mouth daily.   Yes Historical  Provider, MD  atorvastatin (LIPITOR) 80 MG tablet Take 80 mg by mouth daily.   Yes Historical Provider, MD  carbamazepine (TEGRETOL) 200 MG tablet Take 1 tablet (200 mg total) by mouth 2 (two) times daily. Patient taking differently: Take 300 mg by mouth 2 (two) times daily.  06/07/14  Yes Lajean Saver, MD  Cholecalciferol (VITAMIN D) 2000 units CAPS Take 2,000 capsules by mouth every morning.   Yes Historical Provider, MD  clopidogrel (PLAVIX) 75 MG tablet Take 75 mg by mouth daily.   Yes Historical Provider, MD  ferrous sulfate 325 (65 FE) MG tablet Take 325 mg by mouth daily.    Yes Historical Provider, MD  FLUoxetine (PROZAC) 40 MG capsule Take 40 mg by mouth 2 (two) times daily.   Yes Historical Provider, MD  fluvoxaMINE (LUVOX) 50 MG tablet Take 50 mg by mouth every morning.   Yes Historical Provider, MD  furosemide (LASIX) 40 MG tablet Take 40 mg by mouth every morning. May take 2 tabs if retaining fluid   Yes Historical Provider, MD  gabapentin (NEURONTIN) 100 MG capsule Take 100 mg by mouth 2 (two) times daily. 05/20/14  Yes Historical Provider, MD  isosorbide mononitrate (IMDUR) 30 MG 24 hr tablet Take 30 mg by mouth every morning.   Yes Historical Provider, MD  metoprolol  tartrate (LOPRESSOR) 25 MG tablet Take 25 mg by mouth 2 (two) times daily.   Yes Historical Provider, MD  naproxen (NAPROSYN) 500 MG tablet Take 500 mg by mouth 2 (two) times daily as needed for mild pain.   Yes Historical Provider, MD  nitroGLYCERIN (NITROSTAT) 0.4 MG SL tablet Place 0.4 mg under the tongue every 5 (five) minutes as needed for chest pain.   Yes Historical Provider, MD  phenytoin (DILANTIN) 100 MG ER capsule Take 300 mg by mouth at bedtime.   Yes Historical Provider, MD  phenytoin (DILANTIN) 30 MG ER capsule Take 30 mg by mouth at bedtime.   Yes Historical Provider, MD  potassium chloride (K-DUR,KLOR-CON) 10 MEQ tablet Take 10 mEq by mouth daily.    Yes Historical Provider, MD   Physical Exam: Filed  Vitals:   04/17/15 0115 04/17/15 0143  BP: 183/83 171/67  Pulse: 65 77  Resp: 15     BP 171/67 mmHg  Pulse 77  Resp 15  SpO2 100%  LMP 03/17/2015  General Appearance:    Alert, oriented, no distress, appears stated age  Head:    Normocephalic, atraumatic  Eyes:    PERRL, EOMI, sclera non-icteric        Nose:   Nares without drainage or epistaxis. Mucosa, turbinates normal  Throat:   Moist mucous membranes. Oropharynx without erythema or exudate.  Neck:   Supple. No carotid bruits.  No thyromegaly.  No lymphadenopathy.   Back:     No CVA tenderness, no spinal tenderness  Lungs:     Clear to auscultation bilaterally, without wheezes, rhonchi or rales  Chest wall:    No tenderness to palpitation  Heart:    Regular rate and rhythm without murmurs, gallops, rubs  Abdomen:     Soft, non-tender, nondistended, normal bowel sounds, no organomegaly  Genitalia:    deferred  Rectal:    deferred  Extremities:   No clubbing, cyanosis or edema.  Pulses:   2+ and symmetric all extremities  Skin:   Skin color, texture, turgor normal, no rashes or lesions  Lymph nodes:   Cervical, supraclavicular, and axillary nodes normal  Neurologic:   CNII-XII intact. Normal strength, sensation and reflexes      throughout    Labs on Admission:  Basic Metabolic Panel:  Recent Labs Lab 04/16/15 2242  NA 140  K 4.1  CL 102  CO2 28  GLUCOSE 115*  BUN 19  CREATININE 0.83  CALCIUM 9.8   Liver Function Tests: No results for input(s): AST, ALT, ALKPHOS, BILITOT, PROT, ALBUMIN in the last 168 hours. No results for input(s): LIPASE, AMYLASE in the last 168 hours. No results for input(s): AMMONIA in the last 168 hours. CBC:  Recent Labs Lab 04/16/15 2242  WBC 7.6  HGB 11.5*  HCT 35.3*  MCV 90.3  PLT 243   Cardiac Enzymes: No results for input(s): CKTOTAL, CKMB, CKMBINDEX, TROPONINI in the last 168 hours.  BNP (last 3 results) No results for input(s): PROBNP in the last 8760  hours. CBG: No results for input(s): GLUCAP in the last 168 hours.  Radiological Exams on Admission: Dg Chest 2 View  04/16/2015  CLINICAL DATA:  Left-sided shoulder and arm pain since yesterday. EXAM: CHEST  2 VIEW COMPARISON:  None. FINDINGS: Shallow inspiration. Normal heart size and pulmonary vascularity. No focal airspace disease or consolidation in the lungs. No blunting of costophrenic angles. No pneumothorax. Mediastinal contours appear intact. Postoperative changes in the cervical spine. IMPRESSION: No  active cardiopulmonary disease. Electronically Signed   By: Lucienne Capers M.D.   On: 04/16/2015 23:13    EKG: Independently reviewed.  Assessment/Plan Principal Problem:   Hypertensive urgency Active Problems:   Seizure disorder as sequela of cerebrovascular accident [facility])   CAD (coronary artery disease)   1. Hypertensive urgency 1. Continue NTG ointment 2. Continue home BP meds 3. PRN hydralazine ordered 4. Tele monitor 5. Serial trops ordered 2. Seizure disorder - 1. Continue home dilantin 3. CAD - 1. Continue home meds 2. Doubt Plaque wall rupture type ACS today given HPI and BP findings and improvement with BP improvement.    Code Status: Full Code  Family Communication: Daughter at bedside Disposition Plan: Admit to inpatient   Time spent: 56 min  GARDNER, JARED M. [facility] Pager [phone]  If 7AM-7PM, please contact the day team taking care of the patient [email or web address] Password [email or web address] 04/17/2015, 2:06 AM

## 2015-04-17 NOTE — ED Notes (Signed)
Attempted report x 2 

## 2015-04-17 NOTE — ED Notes (Signed)
Attempted report x1. 

## 2015-04-17 NOTE — Progress Notes (Signed)
Patient has arrived to unit with family at bedside. Patient taken to room 3E13. Patient alert and oriented x4, however has memory impairment secondary to stroke that occurred in 2015 (reported by family). Patient and family oriented to unit. Call bell in reach. Bed exit alarm set. Patient steady on feet, however family states that patient will get up without calling for help due to memory impairment. Family also states that patient is a DNR. Dr. Allyson Sabal paged and made aware so order can be updated in EPIC. Patient placed on Cardiac monitor, CCMD notified. Tele verified by 2 staff members. Will continue to monitor patient.

## 2015-04-17 NOTE — Progress Notes (Signed)
Pt does not take Prozac anymore, has bad s/s due to, takes Luvox instead, may need to d/c Prozac, thanks [facility] [place].

## 2015-04-17 NOTE — Consult Note (Signed)
CARDIOLOGY CONSULT NOTE   Patient ID: Kathryn Lewis MRN: [id number] DOB/AGE: [birth year] 70 y.o.  Admit date: 04/16/2015  Primary Physician   No primary care provider on file. Primary Cardiologist  [place], [place] Reason for Consultation   Arm pain.chest pain Requesting Physician  Dr. Allyson Sabal  HPI: Kathryn Lewis is a 70 y.o. female with a history of CAD, MI, PAD, HTN, prior stroke and seizure disorder following stroke, bipolar disorder and decreased memory who presented with hypertensive urgency.   The patient has a significant memory issue since her stroke. Unable to provide any specific information heart attack. 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 Follow once a year. She lives in [place], [place] however frequency visit her daughter at [place]. No family at bedside currently.  For the past few days patient has being having intermittent left arm pain between shoulder to elbow. She has a bruise from scratching her left arm. She also has a bruising marks all over her body due to scratching. Patient states that intermittently she had a substernal chest pain, last episode a few months ago, wage history. 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 The patient was given 3 sublingual nitroglycerin with resolution of pain. Upon arrival to ED her blood pressure was 202/83 and placed on nitroglycerin ointment with significant improvement on her blood pressure. The patient denies lower extremity edema, orthopnea, PND, syncope, melena, nausea, vomiting or blood in her stool or urine.  EKG shows normal sinus rhythm with nonspecific ST change since in inferior lateral lead which appears similar to prior EKG. Troponin x 2 negative. Lytes normal. Chest x-ray without acute abnormality. Currently chest pain-free.  Past Medical History  Diagnosis Date  .  Bipolar 1 disorder ([place])   . Coronary artery disease   . Hypertension   . Stroke ([place])   . MI, old      Past Surgical History  Procedure Laterality Date  . Breast surgery    . Neck surgery      Allergies  Allergen Reactions  . Amoxicillin Other (See Comments)    colitis  . Augmentin [Amoxicillin-Pot Clavulanate]     colitis  . Cephalexin Other (See Comments)    colitis  . Erythromycin Other (See Comments)    colitis  . Niacin And Related Other (See Comments)    colitis  . Other     I have reviewed the patient's current medications . aspirin  81 mg Oral Daily  . atorvastatin  80 mg Oral Daily  . carbamazepine  300 mg Oral BID  . cholecalciferol  2,000 Units Oral q morning - 10a  . clopidogrel  75 mg Oral Daily  . enoxaparin (LOVENOX) injection  40 mg Subcutaneous Q24H  . ferrous sulfate  325 mg Oral Daily  . FLUoxetine  40 mg Oral BID  . fluvoxaMINE  50 mg Oral q morning - 10a  . furosemide  40 mg Oral q morning - 10a  . gabapentin  100 mg Oral BID  . isosorbide mononitrate  30 mg Oral Daily  . metoprolol tartrate  25 mg Oral BID  . nitroGLYCERIN  1 inch Topical 4 times per day  . phenytoin  30 mg Oral QHS  . phenytoin  300 mg Oral QHS  . potassium chloride  10 mEq Oral Daily  . sodium chloride flush  3 mL  Intravenous Q12H     hydrALAZINE, nitroGLYCERIN  Prior to Admission medications   Medication Sig Start Date End Date Taking? Authorizing Provider  aspirin 81 MG chewable tablet Chew 81 mg by mouth daily.   Yes Historical Provider, MD  atorvastatin (LIPITOR) 80 MG tablet Take 80 mg by mouth daily.   Yes Historical Provider, MD  carbamazepine (TEGRETOL) 200 MG tablet Take 1 tablet (200 mg total) by mouth 2 (two) times daily. Patient taking differently: Take 300 mg by mouth 2 (two) times daily.  06/07/14  Yes Lajean Saver, MD  Cholecalciferol (VITAMIN D) 2000 units CAPS Take 2,000 capsules by mouth every morning.   Yes Historical Provider, MD  clopidogrel  (PLAVIX) 75 MG tablet Take 75 mg by mouth daily.   Yes Historical Provider, MD  ferrous sulfate 325 (65 FE) MG tablet Take 325 mg by mouth daily.    Yes Historical Provider, MD  FLUoxetine (PROZAC) 40 MG capsule Take 40 mg by mouth 2 (two) times daily.   Yes Historical Provider, MD  fluvoxaMINE (LUVOX) 50 MG tablet Take 50 mg by mouth every morning.   Yes Historical Provider, MD  furosemide (LASIX) 40 MG tablet Take 40 mg by mouth every morning. May take 2 tabs if retaining fluid   Yes Historical Provider, MD  gabapentin (NEURONTIN) 100 MG capsule Take 100 mg by mouth 2 (two) times daily. 05/20/14  Yes Historical Provider, MD  isosorbide mononitrate (IMDUR) 30 MG 24 hr tablet Take 30 mg by mouth every morning.   Yes Historical Provider, MD  metoprolol tartrate (LOPRESSOR) 25 MG tablet Take 25 mg by mouth 2 (two) times daily.   Yes Historical Provider, MD  naproxen (NAPROSYN) 500 MG tablet Take 500 mg by mouth 2 (two) times daily as needed for mild pain.   Yes Historical Provider, MD  nitroGLYCERIN (NITROSTAT) 0.4 MG SL tablet Place 0.4 mg under the tongue every 5 (five) minutes as needed for chest pain.   Yes Historical Provider, MD  phenytoin (DILANTIN) 100 MG ER capsule Take 300 mg by mouth at bedtime.   Yes Historical Provider, MD  phenytoin (DILANTIN) 30 MG ER capsule Take 30 mg by mouth at bedtime.   Yes Historical Provider, MD  potassium chloride (K-DUR,KLOR-CON) 10 MEQ tablet Take 10 mEq by mouth daily.    Yes Historical Provider, MD     Social History   Social History  . Marital Status: Single    Spouse Name: N/A  . Number of Children: N/A  . Years of Education: N/A   Occupational History  . Not on file.   Social History Main Topics  . Smoking status: Never Smoker   . Smokeless tobacco: Not on file  . Alcohol Use: No  . Drug Use: Not on file  . Sexual Activity: Not on file   Other Topics Concern  . Not on file   Social History Narrative    No family status information on  file.   Family History  Problem Relation Age of Onset  . Hypertension Mother   . Hypertension Father       ROS:  Full 14 point review of systems complete and found to be negative unless listed above.  Physical Exam: Blood pressure 150/74, pulse 67, resp. rate 13, last menstrual period 03/17/2015, SpO2 97 %.  General: Well developed, well nourished, female in no acute distress Head: Eyes PERRLA, No xanthomas. Normocephalic and atraumatic, oropharynx without edema or exudate.  Lungs: Resp regular and unlabored, CTA. Heart:  RRR no s3, s4, or murmurs..   Neck: No carotid bruits. No lymphadenopathy.  No JVD. Abdomen: Bowel sounds present, abdomen soft and non-tender without masses or hernias noted. Msk:  No spine or cva tenderness. No weakness, no joint deformities or effusions. Extremities: No clubbing, cyanosis or edema. DP/PT/Radials 1+ and equal bilaterally. Left arm has a approximately 3 cm bruise.  Neuro: Alert and oriented X 3. No focal deficits noted. Psych:  Good affect, responds appropriately Skin: No rashes or lesions noted. Scattered scratching marks all over her body.  Labs:   Lab Results  Component Value Date   WBC 7.6 04/16/2015   HGB 11.5* 04/16/2015   HCT 35.3* 04/16/2015   MCV 90.3 04/16/2015   PLT 243 04/16/2015    Recent Labs  04/16/15 2242  INR 0.93    Recent Labs Lab 04/16/15 2242  NA 140  K 4.1  CL 102  CO2 28  BUN 19  CREATININE 0.83  CALCIUM 9.8  GLUCOSE 115*   No results found for: MG  Recent Labs  04/17/15 0244 04/17/15 0806  TROPONINI <0.03 <0.03    Recent Labs  04/16/15 2238  TROPIPOC 0.01     ECG: Vent. rate 72 BPM PR interval 150 ms QRS duration 88 ms QT/QTc 360/394 ms P-R-T axes 77 76 -62  Radiology:  Dg Chest 2 View  04/16/2015  CLINICAL DATA:  Left-sided shoulder and arm pain since yesterday. EXAM: CHEST  2 VIEW COMPARISON:  None. FINDINGS: Shallow inspiration. Normal heart size and pulmonary vascularity. No  focal airspace disease or consolidation in the lungs. No blunting of costophrenic angles. No pneumothorax. Mediastinal contours appear intact. Postoperative changes in the cervical spine. IMPRESSION: No active cardiopulmonary disease. Electronically Signed   By: Lucienne Capers M.D.   On: 04/16/2015 23:13    ASSESSMENT AND PLAN:     1. Left arm pain from shoulder to elbow - Ongoing for the past few days. She was found to have hypertensive. Her pain was resolved with sublingual nitroglycerin upon improvement of blood pressure. No history of fall. However patient has a significant memory issue. Differential includes anginal pain versus musculoskeletal.  2. Hypertensive urgency - Initial blood pressure of 230/90 upon EMS arrival. Now improved. Her pain could be from hypertensive urgency.  3.   CAD (coronary artery disease) - The patient states that she has a history of MI. Unable to provide further information due to memory issue. No family at bedside currently. Updated ED nurse to obtain the records from primary cardiologist. We will discuss plan with M.D. Keep nothing by mouth. - Continue aspirin/Lipitor/Plavix/Imdur/metoprolol.   4. Seizure disorder as sequela of cerebrovascular accident [facility])   Signed: Bhagat,Bhavinkumar, [place] 04/17/2015, 9:30 AM Pager [phone]  Co-Sign MD Pt seen and examined  Agree with findings as noted above by B Bhagat above  Arm discomfort is atypical  I am not convinced cardiac  She currently denies symptoms   On exam   Lungs aree CTA  Cardiac RRR  No murmurs  Ext without edema  WIll continue to follow  Will try to get records from [facility], [place]

## 2015-04-17 NOTE — Progress Notes (Signed)
Patient seen and examined  Kathryn Lewis is a 70 y.o. female with a history of CAD, MI, PAD, HTN, prior stroke and seizure disorder following stroke, bipolar disorder and decreased memory who presented with hypertensive urgency. Presented with left arm pain and intermittent start substernal. Upon arrival to ED her blood pressure was 202/83 and placed on nitroglycerin ointment with significant improvement on her blood pressure.EKG shows normal sinus rhythm with nonspecific ST change since in inferior lateral lead which appears similar to prior EKG. Troponin x 2 negative. Lytes normal. Chest x-ray without acute abnormality.  Plan Hypertensive urgency-continue current medications, hydralazine  Left arm pain/chest pain/history of coronary artery disease Patient takes aspirin, Plavix cardiology consultation, anticipate patient would need stress test prior to discharge Continue aspirin/Lipitor/Plavix/Imdur/metoprolol.

## 2015-04-17 NOTE — ED Notes (Signed)
Pt ambulatory w/ steady gait to restroom. 

## 2015-04-18 ENCOUNTER — Inpatient Hospital Stay (HOSPITAL_BASED_OUTPATIENT_CLINIC_OR_DEPARTMENT_OTHER): Payer: Medicare Other

## 2015-04-18 ENCOUNTER — Encounter (HOSPITAL_COMMUNITY): Payer: Self-pay | Admitting: Physician Assistant

## 2015-04-18 DIAGNOSIS — R079 Chest pain, unspecified: Secondary | ICD-10-CM | POA: Diagnosis not present

## 2015-04-18 DIAGNOSIS — I69398 Other sequelae of cerebral infarction: Secondary | ICD-10-CM | POA: Diagnosis not present

## 2015-04-18 DIAGNOSIS — I160 Hypertensive urgency: Secondary | ICD-10-CM | POA: Diagnosis not present

## 2015-04-18 DIAGNOSIS — I2510 Atherosclerotic heart disease of native coronary artery without angina pectoris: Secondary | ICD-10-CM

## 2015-04-18 DIAGNOSIS — G40909 Epilepsy, unspecified, not intractable, without status epilepticus: Secondary | ICD-10-CM | POA: Diagnosis not present

## 2015-04-18 DIAGNOSIS — I200 Unstable angina: Secondary | ICD-10-CM | POA: Diagnosis not present

## 2015-04-18 LAB — ECHOCARDIOGRAM COMPLETE: WEIGHTICAEL: 2147.2 [oz_av]

## 2015-04-18 MED ORDER — SPIRONOLACTONE 25 MG PO TABS: 25.0000 mg | ORAL_TABLET | Freq: Every day | ORAL | Status: DC

## 2015-04-18 MED ORDER — AMLODIPINE BESYLATE 10 MG PO TABS: 10.0000 mg | ORAL_TABLET | Freq: Every day | ORAL | Status: DC

## 2015-04-18 MED ORDER — AMLODIPINE BESYLATE 10 MG PO TABS
10.0000 mg | ORAL_TABLET | Freq: Every day | ORAL | Status: DC
  Administered 2015-04-18: 10 mg via ORAL
  Filled 2015-04-18: qty 1

## 2015-04-18 NOTE — Care Management Obs Status (Signed)
[facility] NOTIFICATION   Patient Details  Name: Kathryn Lewis MRN: [id number] Date of Birth: 1945-03-02   Medicare Observation Status Notification Given:  Yes    Royston Bake, RN 04/18/2015, 12:01 PM

## 2015-04-18 NOTE — Progress Notes (Signed)
Pt a/o forgetful, no c/o pain, daughter at bedside, VSS, pt stable

## 2015-04-18 NOTE — Progress Notes (Signed)
Echocardiogram 2D Echocardiogram has been performed.  Joelene Millin 04/18/2015, 12:23 PM

## 2015-04-18 NOTE — Progress Notes (Signed)
Patient Name: Kathryn Lewis Date of Encounter: 04/18/2015   SUBJECTIVE  No chest pain, sob or palpitations. Improved L shoulder/arm pain.   CURRENT MEDS . aspirin  81 mg Oral Daily  . atorvastatin  80 mg Oral Daily  . carbamazepine  300 mg Oral BID  . cholecalciferol  2,000 Units Oral q morning - 10a  . clopidogrel  75 mg Oral Daily  . enoxaparin (LOVENOX) injection  40 mg Subcutaneous Q24H  . ferrous sulfate  325 mg Oral Daily  . FLUoxetine  40 mg Oral BID  . fluvoxaMINE  50 mg Oral q morning - 10a  . furosemide  40 mg Oral q morning - 10a  . gabapentin  100 mg Oral BID  . [START ON 04/19/2015] isosorbide mononitrate  30 mg Oral Daily  . metoprolol tartrate  25 mg Oral BID  . nitroGLYCERIN  1 inch Topical 4 times per day  . phenytoin  30 mg Oral QHS  . phenytoin  300 mg Oral QHS  . potassium chloride  10 mEq Oral Daily  . sodium chloride flush  3 mL Intravenous Q12H    OBJECTIVE  Filed Vitals:   04/17/15 2100 04/18/15 0036 04/18/15 0408 04/18/15 0925  BP: 142/66 164/71 136/78 173/49  Pulse: 84 66 68 76  Temp: 98 F (36.7 C) 98 F (36.7 C) 98.2 F (36.8 C) 97.8 F (36.6 C)  TempSrc: Oral Oral Oral Oral  Resp: 20 20 18 18   Weight:   134 lb 3.2 oz (60.873 kg)   SpO2: 98% 99% 99% 97%    Intake/Output Summary (Last 24 hours) at 04/18/15 1010 Last data filed at 04/18/15 0800  Gross per 24 hour  Intake    600 ml  Output   1000 ml  Net   -400 ml   Filed Weights   04/18/15 0408  Weight: 134 lb 3.2 oz (60.873 kg)    PHYSICAL EXAM  General: [place], NAD. Neuro: Alert and oriented X 3. Moves all extremities spontaneously. Psych: Normal affect. HEENT:  Normal  Neck: Supple without bruits or JVD. Lungs:  Resp regular and unlabored, CTA. Heart: RRR no s3, s4, or murmurs. Abdomen: Soft, non-tender, non-distended, BS + x 4.  Extremities: No clubbing, cyanosis or edema. DP/PT/Radials 2+ and equal bilaterally.  Accessory Clinical Findings  CBC  Recent Labs  04/16/15 2242  WBC 7.6  HGB 11.5*  HCT 35.3*  MCV 90.3  PLT 0000000   Basic Metabolic Panel  Recent Labs  04/16/15 2242  NA 140  K 4.1  CL 102  CO2 28  GLUCOSE 115*  BUN 19  CREATININE 0.83  CALCIUM 9.8   Cardiac Enzymes  Recent Labs  04/17/15 0244 04/17/15 0806 04/17/15 1346  TROPONINI <0.03 <0.03 <0.03    TELE  Sinus rhythm   Radiology/Studies  Dg Chest 2 View  04/16/2015  CLINICAL DATA:  Left-sided shoulder and arm pain since yesterday. EXAM: CHEST  2 VIEW COMPARISON:  None. FINDINGS: Shallow inspiration. Normal heart size and pulmonary vascularity. No focal airspace disease or consolidation in the lungs. No blunting of costophrenic angles. No pneumothorax. Mediastinal contours appear intact. Postoperative changes in the cervical spine. IMPRESSION: No active cardiopulmonary disease. Electronically Signed   By: Lucienne Capers M.D.   On: 04/16/2015 23:13    ASSESSMENT AND PLAN  Obtained records from [facility], [place], [place]. Provider: Randol Kern MD  Last note 05/20/2014 to review result of CT aortogram with lower extremity runoff (mild to moderate disease  in common iliac arteries, complete occulusion of her bilateral external iliac arteries with heavily disease common femoral arteries. She also has disease to her SFAs bilaterally. Popliteal arteries and tibial vessel runoff appears to be adequate) --> plan was to treat medically due to right first toe wound at that time. Ultimate plan was to do a aortobifemoral bypass.  Unsure if the patient has followed afterwards or not as she has memory issue. No family at bedside. Usually Daughter takes care of her. No Indication of MI, however noted hx of CAD in native artery.  --- carotid artery stenosis: 50-69% bilateral ICA stenoses by velocity criteria but no visible plaque and ICA/CCA ratio 1.72 on right and 1.38 on left --- Echo 12/2009 showed 60-65%   1. Left arm pain from shoulder to elbow - Ongoing  for the past few days. She was found to have hypertensive. Her pain was resolved with sublingual nitroglycerin upon improvement of blood pressure. No history of fall. The pain improved. She has ruled out. Troponin x 3 negative. EKG non ischemic. No family at bedside and unsure where will be the patient would follow afterwards. Likely outpatient f/u either [facility] or primary cardiologist.   2. Hypertensive urgency - Initial blood pressure of 230/90 upon EMS arrival. BP still elevated.  - Continue Imdur, BB and lasix. Consider adding amlodipine/ACE.   3. CAD (coronary artery disease)/ carotid artery disease/ PAD - As noted above. Will get echo today --> if normal no inpatient work up needed. Will deffer management per MD. Likely f/u with primary cardiologist.  - Continue aspirin/Lipitor/Plavix/Imdur/metoprolol.   4. Seizure disorder as sequela of cerebrovascular accident ([place])  Signed, Bhagat,Bhavinkumar PA-C Pager [phone]  Pt seen and examined  I agree with findings as noted by B  Bhagat above   Pt denies CP or arm pain  ON exam LUngs CTA  Cardiac RRR  No S3  Ext without edema I am not convinced arm discomfort was cardiac   I would not plan further cardiact testing  WIll sign off  Call with questions.  Dorris Carnes

## 2015-04-18 NOTE — Progress Notes (Signed)
Pt being dc to home with family, instructions and follow ups given to pt and family, verbalized understanding and left via wheelchair with family

## 2015-04-18 NOTE — Discharge Summary (Addendum)
Physician Discharge Summary  Kathryn Lewis MRN: [id number] DOB/AGE: 70/02/1945 70 y.o.  PCP: No primary care provider on file.   Admit date: 04/16/2015 Discharge date: 04/18/2015  Discharge Diagnoses:   Principal Problem:   Hypertensive urgency Active Problems:   Seizure disorder as sequela of cerebrovascular accident [facility])   CAD (coronary artery disease) Peripheral vascular disease Adrenal adenoma that needs follow-up   Follow-up recommendations Follow-up with PCP in 3-5 days , including all  additional recommended appointments as below Follow-up CBC, CMP in 3-5 days Patient needs follow-up for 2.5 cm adrenal adenoma seen on a prior CT scan done in [place], patient was explained to do so Patient is to follow-up with her cardiologist in [place] for history of coronary artery disease, repeat Myoview     Medication List    STOP taking these medications        naproxen 500 MG tablet  Commonly known as:  NAPROSYN      TAKE these medications        amLODipine 10 MG tablet  Commonly known as:  NORVASC  Take 1 tablet (10 mg total) by mouth daily.     aspirin 81 MG chewable tablet  Chew 81 mg by mouth daily.     atorvastatin 80 MG tablet  Commonly known as:  LIPITOR  Take 80 mg by mouth daily.     carbamazepine 200 MG tablet  Commonly known as:  TEGRETOL  Take 1 tablet (200 mg total) by mouth 2 (two) times daily.     clopidogrel 75 MG tablet  Commonly known as:  PLAVIX  Take 75 mg by mouth daily.     ferrous sulfate 325 (65 FE) MG tablet  Take 325 mg by mouth daily.     fluvoxaMINE 50 MG tablet  Commonly known as:  LUVOX  Take 50 mg by mouth every morning.     furosemide 40 MG tablet  Commonly known as:  LASIX  Take 40 mg by mouth every morning. May take 2 tabs if retaining fluid     gabapentin 100 MG capsule  Commonly known as:  NEURONTIN  Take 100 mg by mouth 2 (two) times daily.     isosorbide mononitrate 30 MG 24 hr tablet  Commonly  known as:  IMDUR  Take 30 mg by mouth every morning.     metoprolol tartrate 25 MG tablet  Commonly known as:  LOPRESSOR  Take 25 mg by mouth 2 (two) times daily.     nitroGLYCERIN 0.4 MG SL tablet  Commonly known as:  NITROSTAT  Place 0.4 mg under the tongue every 5 (five) minutes as needed for chest pain.     phenytoin 100 MG ER capsule  Commonly known as:  DILANTIN  Take 300 mg by mouth at bedtime.     phenytoin 30 MG ER capsule  Commonly known as:  DILANTIN  Take 30 mg by mouth at bedtime.     potassium chloride 10 MEQ tablet  Commonly known as:  K-DUR,KLOR-CON  Take 10 mEq by mouth daily.     spironolactone 25 MG tablet  Commonly known as:  ALDACTONE  Take 1 tablet (25 mg total) by mouth daily.     Vitamin D 2000 units Caps  Take 2,000 capsules by mouth every morning.         Discharge Condition: Stable   Discharge Instructions Get Medicines reviewed and adjusted: Please take all your medications with you for your next visit with your Primary  MD  Please request your Primary MD to go over all hospital tests and procedure/radiological results at the follow up, please ask your Primary MD to get all Hospital records sent to his/her office.  If you experience worsening of your admission symptoms, develop shortness of breath, life threatening emergency, suicidal or homicidal thoughts you must seek medical attention immediately by calling 911 or calling your MD immediately if symptoms less severe.  You must read complete instructions/literature along with all the possible adverse reactions/side effects for all the Medicines you take and that have been prescribed to you. Take any new Medicines after you have completely understood and accpet all the possible adverse reactions/side effects.   Do not drive when taking Pain medications.   Do not take more than prescribed Pain, Sleep and Anxiety Medications  Special Instructions: If you have smoked or chewed Tobacco in  the last 2 yrs please stop smoking, stop any regular Alcohol and or any Recreational drug use.  Wear Seat belts while driving.  Please note  You were cared for by a hospitalist during your hospital stay. Once you are discharged, your primary care physician will handle any further medical issues. Please note that NO REFILLS for any discharge medications will be authorized once you are discharged, as it is imperative that you return to your primary care physician (or establish a relationship with a primary care physician if you do not have one) for your aftercare needs so that they can reassess your need for medications and monitor your lab values.    Allergies  Allergen Reactions  . Amoxicillin Other (See Comments)    colitis  . Augmentin [Amoxicillin-Pot Clavulanate]     colitis  . Cephalexin Other (See Comments)    colitis  . Erythromycin Other (See Comments)    colitis  . Niacin And Related Other (See Comments)    colitis  . Other       Disposition: 01-Home or Self Care   Consults:  Cardiology     Significant Diagnostic Studies:  Dg Chest 2 View  04/16/2015  CLINICAL DATA:  Left-sided shoulder and arm pain since yesterday. EXAM: CHEST  2 VIEW COMPARISON:  None. FINDINGS: Shallow inspiration. Normal heart size and pulmonary vascularity. No focal airspace disease or consolidation in the lungs. No blunting of costophrenic angles. No pneumothorax. Mediastinal contours appear intact. Postoperative changes in the cervical spine. IMPRESSION: No active cardiopulmonary disease. Electronically Signed   By: Lucienne Capers M.D.   On: 04/16/2015 23:13    2-D echo  LV EF: 55% - 60%  ------------------------------------------------------------------- Indications: Chest pain 786.51.  ------------------------------------------------------------------- History: PMH: Coronary artery disease. Risk  factors: Hypertension.  ------------------------------------------------------------------- Study Conclusions  - Left ventricle: The cavity size was normal. Wall thickness was  normal. Systolic function was normal. The estimated ejection  fraction was in the range of 55% to 60%. Wall motion was normal;  there were no regional wall motion abnormalities. Doppler  parameters are consistent with abnormal left ventricular  relaxation (grade 1 diastolic dysfunction). - Mitral valve: There was mild regurgitation.  Filed Weights   04/18/15 0408  Weight: 60.873 kg (134 lb 3.2 oz)     Microbiology: No results found for this or any previous visit (from the past 240 hour(s)).     Blood Culture    Component Value Date/Time   SDES CSF 06/07/2014 1545   SDES CSF 06/07/2014 1545   SPECREQUEST NONE 06/07/2014 [place] 06/07/2014 1545   CULT  06/07/2014  1545    NO GROWTH 3 DAYS Performed at [place] 06/11/2014 FINAL 06/07/2014 1545   REPTSTATUS 06/07/2014 FINAL 06/07/2014 1545      Labs: Results for orders placed or performed during the hospital encounter of 04/16/15 (from the past 48 hour(s))  I-stat troponin, ED (not at [facility], [facility])     Status: None   Collection Time: 04/16/15 10:38 PM  Result Value Ref Range   Troponin i, poc 0.01 0.00 - 0.08 ng/mL   Comment 3            Comment: Due to the release kinetics of cTnI, a negative result within the first hours of the onset of symptoms does not rule out myocardial infarction with certainty. If myocardial infarction is still suspected, repeat the test at appropriate intervals.   Basic metabolic panel     Status: Abnormal   Collection Time: 04/16/15 10:42 PM  Result Value Ref Range   Sodium 140 135 - 145 mmol/L   Potassium 4.1 3.5 - 5.1 mmol/L   Chloride 102 101 - 111 mmol/L   CO2 28 22 - 32 mmol/L   Glucose, Bld 115 (H) 65 - 99 mg/dL   BUN 19 6 - 20 mg/dL   Creatinine, Ser 0.83  0.44 - 1.00 mg/dL   Calcium 9.8 8.9 - 10.3 mg/dL   GFR calc non Af Amer >60 >60 mL/min   GFR calc Af Amer >60 >60 mL/min    Comment: (NOTE) The eGFR has been calculated using the CKD EPI equation. This calculation has not been validated in all clinical situations. eGFR's persistently <60 mL/min signify possible Chronic Kidney Disease.    Anion gap 10 5 - 15  CBC     Status: Abnormal   Collection Time: 04/16/15 10:42 PM  Result Value Ref Range   WBC 7.6 4.0 - 10.5 K/uL   RBC 3.91 3.87 - 5.11 MIL/uL   Hemoglobin 11.5 (L) 12.0 - 15.0 g/dL   HCT 35.3 (L) 36.0 - 46.0 %   MCV 90.3 78.0 - 100.0 fL   MCH 29.4 26.0 - 34.0 pg   MCHC 32.6 30.0 - 36.0 g/dL   RDW 14.9 11.5 - 15.5 %   Platelets 243 150 - 400 K/uL  Protime-INR - (order if Patient is taking Coumadin / Warfarin)     Status: None   Collection Time: 04/16/15 10:42 PM  Result Value Ref Range   Prothrombin Time 12.7 11.6 - 15.2 seconds   INR 0.93 0.00 - 1.49  Troponin I     Status: None   Collection Time: 04/17/15  2:44 AM  Result Value Ref Range   Troponin I <0.03 <0.031 ng/mL    Comment:        NO INDICATION OF MYOCARDIAL INJURY.   Troponin I     Status: None   Collection Time: 04/17/15  8:06 AM  Result Value Ref Range   Troponin I <0.03 <0.031 ng/mL    Comment:        NO INDICATION OF MYOCARDIAL INJURY.   Troponin I     Status: None   Collection Time: 04/17/15  1:46 PM  Result Value Ref Range   Troponin I <0.03 <0.031 ng/mL    Comment:        NO INDICATION OF MYOCARDIAL INJURY.   Glucose, capillary     Status: Abnormal   Collection Time: 04/17/15  4:47 PM  Result Value Ref Range   Glucose-Capillary 113 (H) 65 -  99 mg/dL     Lipid Panel  No results found for: CHOL, TRIG, HDL, CHOLHDL, VLDL, LDLCALC, LDLDIRECT   No results found for: HGBA1C   Lab Results  Component Value Date   CREATININE 0.83 04/16/2015     HPI : Kathryn Lewis is a 70 y.o. female with a history of CAD, MI, PAD, HTN, prior stroke  and seizure disorder following stroke, bipolar disorder and decreased memory who presented with hypertensive urgency.   The patient has a significant memory issue since her stroke. Unable to provide any specific information heart attack. 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 Follow once a year. She lives in [place], [place] however frequency visit her daughter at [place]. No family at bedside currently.  patient states that intermittently she had a substernal chest pain, last episode a few months ago, wage history. Due to ongoing shoulder pain, she went fire department where her blood pressure was elevated, upon EMS arrival at fire department her blood pressure was 230/90. The patient was given 3 sublingual nitroglycerin with resolution of pain. Upon arrival to ED her blood pressure was 202/83 and placed on nitroglycerin ointment with significant improvement on her blood pressure. EKG shows normal sinus rhythm with nonspecific ST change since in inferior lateral lead which appears similar to prior EKG. Troponin x 2 negative. Lytes normal. Chest x-ray without acute abnormality. Currently chest pain-free.   Assessment and plan  1. Left arm pain from shoulder to elbow - The setting of hypertensive urgency. Her pain was resolved with sublingual nitroglycerin upon improvement of blood pressure. No history of fall..Troponin x 3 negative. EKG non ischemic. No family at bedside and unsure where will be the patient would follow afterwards. Likely outpatient f/u either [facility] or primary cardiologist.   2. Hypertensive urgency - Initial blood pressure of 230/90 upon EMS arrival. BP still elevated.  - Continue Imdur, BB and lasix. Added Norvasc, Aldactone given history of adrenal adenoma  3. CAD (coronary artery disease)/ carotid artery disease/ PAD  Repeat 2-D echo as above --> if normal no inpatient work up needed. Cardiac enzymes negative 3 - Continue  aspirin/Lipitor/Plavix/Imdur/metoprolol.  May benefit from outpt myoview, no inpatient testing recommended by our cardiologist  4. Seizure disorder as sequela of cerebrovascular accident [facility])   Peripheral vascular disease  records from [place], [place], [place]. Provider: Randol Kern MD Last note 05/20/2014 to review result of CT aortogram with lower extremity runoff (mild to moderate disease in common iliac arteries, complete occulusion of her bilateral external iliac arteries with heavily disease common femoral arteries. She also has disease to her SFAs bilaterally. Popliteal arteries and tibial vessel runoff appears to be adequate) --> plan was to treat medically due to right first toe wound at that time. Ultimate plan was to do a aortobifemoral bypass. Unsure if the patient has followed afterwards  No Indication of MI, however noted hx of CAD in native artery.  --- carotid artery stenosis: 50-69% bilateral ICA stenoses by velocity criteria but no visible plaque and ICA/CCA ratio 1.72 on right and 1.38 on left --- Echo 12/2009 showed 60-65%    Discharge Exam:  Blood pressure 166/71, pulse 66, temperature 98.1 F (36.7 C), temperature source Oral, resp. rate 20, height _0  (1.6 m), weight 60.873 kg (134 lb 3.2 oz), last menstrual period 03/17/2015, SpO2 98 %.  General: Pleasant, NAD. Neuro: Alert and oriented X 3. Moves all extremities spontaneously. Psych: Normal affect. HEENT: Normal Neck: Supple  without bruits or JVD. Lungs: Resp regular and unlabored, CTA. Heart: RRR no s3, s4, or murmurs. Abdomen: Soft, non-tender, non-distended, BS + x 4.  Extremities: No clubbing, cyanosis or edema. DP/PT/Radials 2+ and equal bilaterally        Follow-up Information    Follow up with pcp. Schedule an appointment as soon as possible for a visit in 1 week.      Follow up with cardiology. Schedule an appointment as soon as possible for a visit in 1 week.       SignedReyne Dumas 04/18/2015, 12:35 PM        Time spent >45 mins

## 2015-04-22 ENCOUNTER — Encounter: Payer: Medicare Other | Admitting: Cardiology

## 2015-04-24 ENCOUNTER — Encounter: Payer: Self-pay | Admitting: Cardiology

## 2015-12-11 ENCOUNTER — Other Ambulatory Visit: Payer: Self-pay

## 2015-12-11 ENCOUNTER — Inpatient Hospital Stay (HOSPITAL_COMMUNITY)
Admission: EM | Admit: 2015-12-11 | Discharge: 2015-12-13 | DRG: 194 | Disposition: A | Discharge status: Home Health | Payer: Medicare Other | Attending: Internal Medicine | Admitting: Internal Medicine

## 2015-12-11 ENCOUNTER — Encounter (HOSPITAL_COMMUNITY): Payer: Self-pay | Admitting: Emergency Medicine

## 2015-12-11 ENCOUNTER — Emergency Department (HOSPITAL_COMMUNITY): Payer: Medicare Other

## 2015-12-11 DIAGNOSIS — M25562 Pain in left knee: Secondary | ICD-10-CM | POA: Diagnosis present

## 2015-12-11 DIAGNOSIS — I2510 Atherosclerotic heart disease of native coronary artery without angina pectoris: Secondary | ICD-10-CM | POA: Diagnosis present

## 2015-12-11 DIAGNOSIS — G40909 Epilepsy, unspecified, not intractable, without status epilepticus: Secondary | ICD-10-CM

## 2015-12-11 DIAGNOSIS — R531 Weakness: Secondary | ICD-10-CM | POA: Diagnosis present

## 2015-12-11 DIAGNOSIS — J189 Pneumonia, unspecified organism: Secondary | ICD-10-CM | POA: Diagnosis not present

## 2015-12-11 DIAGNOSIS — M25561 Pain in right knee: Secondary | ICD-10-CM | POA: Diagnosis present

## 2015-12-11 DIAGNOSIS — E1149 Type 2 diabetes mellitus with other diabetic neurological complication: Secondary | ICD-10-CM

## 2015-12-11 DIAGNOSIS — Z66 Do not resuscitate: Secondary | ICD-10-CM | POA: Diagnosis present

## 2015-12-11 DIAGNOSIS — R000 Tachycardia, unspecified: Secondary | ICD-10-CM | POA: Diagnosis present

## 2015-12-11 DIAGNOSIS — I110 Hypertensive heart disease with heart failure: Secondary | ICD-10-CM | POA: Diagnosis present

## 2015-12-11 DIAGNOSIS — I119 Hypertensive heart disease without heart failure: Secondary | ICD-10-CM | POA: Diagnosis present

## 2015-12-11 DIAGNOSIS — M25511 Pain in right shoulder: Secondary | ICD-10-CM | POA: Diagnosis present

## 2015-12-11 DIAGNOSIS — Z7902 Long term (current) use of antithrombotics/antiplatelets: Secondary | ICD-10-CM

## 2015-12-11 DIAGNOSIS — I10 Essential (primary) hypertension: Secondary | ICD-10-CM

## 2015-12-11 DIAGNOSIS — W010XXA Fall on same level from slipping, tripping and stumbling without subsequent striking against object, initial encounter: Secondary | ICD-10-CM | POA: Diagnosis present

## 2015-12-11 DIAGNOSIS — Z7982 Long term (current) use of aspirin: Secondary | ICD-10-CM

## 2015-12-11 DIAGNOSIS — E1151 Type 2 diabetes mellitus with diabetic peripheral angiopathy without gangrene: Secondary | ICD-10-CM | POA: Diagnosis present

## 2015-12-11 DIAGNOSIS — I69398 Other sequelae of cerebral infarction: Secondary | ICD-10-CM

## 2015-12-11 DIAGNOSIS — I69311 Memory deficit following cerebral infarction: Secondary | ICD-10-CM

## 2015-12-11 DIAGNOSIS — I5032 Chronic diastolic (congestive) heart failure: Secondary | ICD-10-CM | POA: Diagnosis present

## 2015-12-11 DIAGNOSIS — J209 Acute bronchitis, unspecified: Secondary | ICD-10-CM | POA: Diagnosis present

## 2015-12-11 DIAGNOSIS — I252 Old myocardial infarction: Secondary | ICD-10-CM

## 2015-12-11 DIAGNOSIS — Z79899 Other long term (current) drug therapy: Secondary | ICD-10-CM

## 2015-12-11 DIAGNOSIS — Z8249 Family history of ischemic heart disease and other diseases of the circulatory system: Secondary | ICD-10-CM

## 2015-12-11 DIAGNOSIS — I6529 Occlusion and stenosis of unspecified carotid artery: Secondary | ICD-10-CM | POA: Diagnosis present

## 2015-12-11 DIAGNOSIS — W19XXXA Unspecified fall, initial encounter: Secondary | ICD-10-CM

## 2015-12-11 LAB — URINE MICROSCOPIC-ADD ON

## 2015-12-11 LAB — BASIC METABOLIC PANEL
ANION GAP: 11 (ref 5–15)
BUN: 12 mg/dL (ref 6–20)
CALCIUM: 9.3 mg/dL (ref 8.9–10.3)
CO2: 23 mmol/L (ref 22–32)
Chloride: 97 mmol/L — ABNORMAL LOW (ref 101–111)
Creatinine, Ser: 0.88 mg/dL (ref 0.44–1.00)
Glucose, Bld: 247 mg/dL — ABNORMAL HIGH (ref 65–99)
POTASSIUM: 4.1 mmol/L (ref 3.5–5.1)
Sodium: 131 mmol/L — ABNORMAL LOW (ref 135–145)

## 2015-12-11 LAB — CBC
HEMATOCRIT: 36.7 % (ref 36.0–46.0)
HEMOGLOBIN: 12.2 g/dL (ref 12.0–15.0)
MCH: 27.9 pg (ref 26.0–34.0)
MCHC: 33.2 g/dL (ref 30.0–36.0)
MCV: 84 fL (ref 78.0–100.0)
Platelets: 198 10*3/uL (ref 150–400)
RBC: 4.37 MIL/uL (ref 3.87–5.11)
RDW: 13.9 % (ref 11.5–15.5)
WBC: 15 10*3/uL — AB (ref 4.0–10.5)

## 2015-12-11 LAB — URINALYSIS, ROUTINE W REFLEX MICROSCOPIC
BILIRUBIN URINE: NEGATIVE
Glucose, UA: NEGATIVE mg/dL
Hgb urine dipstick: NEGATIVE
Ketones, ur: NEGATIVE mg/dL
NITRITE: NEGATIVE
Protein, ur: 30 mg/dL — AB
SPECIFIC GRAVITY, URINE: 1.012 (ref 1.005–1.030)
pH: 6 (ref 5.0–8.0)

## 2015-12-11 MED ORDER — DOXYCYCLINE HYCLATE 100 MG PO TABS
100.0000 mg | ORAL_TABLET | Freq: Once | ORAL | Status: AC
  Administered 2015-12-11: 100 mg via ORAL
  Filled 2015-12-11: qty 1

## 2015-12-11 MED ORDER — IPRATROPIUM-ALBUTEROL 0.5-2.5 (3) MG/3ML IN SOLN
3.0000 mL | Freq: Once | RESPIRATORY_TRACT | Status: AC
  Administered 2015-12-11: 3 mL via RESPIRATORY_TRACT
  Filled 2015-12-11: qty 3

## 2015-12-11 NOTE — ED Triage Notes (Signed)
Pt here with increasing weakness since having flu and PNA vaccine on Tuesday; pt now unable to walk or transfer herself where normally walks; pt fell last night

## 2015-12-11 NOTE — ED Notes (Signed)
Pt given food and drink at this time.

## 2015-12-11 NOTE — ED Provider Notes (Signed)
[facility] DEPT Provider Note   CSN: [id number] Arrival date & time: 12/11/15  1752     History   Chief Complaint Chief Complaint  Patient presents with  . Weakness    HPI Kathryn Lewis is a 70 y.o. female.  She presents for weakness, since having immunizations for influenza and pneumonia, 2 days ago. She has had a cough for 2 weeks, productive of yellow sputum, and not improved after taking a Z-Pak last week. She has had decreased appetite for several days. Yesterday she fell injuring her left knee. She did not hit her head. Her daughter had to help her up and she was able to ambulate afterwards. Today she continued to feel weak, so the daughter brought her here. Patient denies fever, chills, chest pain, abdominal pain, dysuria, urinary frequency, or change in bowel habits. There are no other known modifying factors.  HPI  Past Medical History:  Diagnosis Date  . Bipolar 1 disorder ([place])   . Carotid artery stenosis    50-69% bilateral ICA stenoses by velocity criteria but no visible plaque and ICA/CCA ratio 1.72 on right and 1.38 on left  . Coronary artery disease   . Hypertension   . Left adrenal mass ([facility])    2.5 cm L adrenal mass on CT per note of Dr. Seleta Rhymes 05/20/2014  . MI, old   . PAD (peripheral artery disease) ([place])    03/2014 - CT aortogram with lower extremity runoff (mild to moderate disease in common iliac arteries, complete occulusion of her bilateral external iliac arteries with heavily disease common femoral arteries. She also has disease to her SFAs bilaterally. Popliteal arteries and tibial vessel runoff appears to be adequate)  . Stroke [facility])     Patient Active Problem List   Diagnosis Date Noted  . Hypertensive urgency 04/17/2015  . Seizure disorder as sequela of cerebrovascular accident ([place]) 04/17/2015  . CAD (coronary artery disease) 04/17/2015    Past Surgical History:  Procedure Laterality Date  . BREAST SURGERY    . NECK SURGERY      OB  History    No data available       Home Medications    Prior to Admission medications   Medication Sig Start Date End Date Taking? Authorizing Provider  amLODipine (NORVASC) 10 MG tablet Take 1 tablet (10 mg total) by mouth daily. 04/18/15  Yes Reyne Dumas, MD  aspirin 81 MG chewable tablet Chew 81 mg by mouth daily.   Yes Historical Provider, MD  atorvastatin (LIPITOR) 80 MG tablet Take 80 mg by mouth daily.   Yes Historical Provider, MD  Cholecalciferol (VITAMIN D) 2000 units CAPS Take 2,000 capsules by mouth 3 (three) times daily.    Yes Historical Provider, MD  clopidogrel (PLAVIX) 75 MG tablet Take 75 mg by mouth daily.   Yes Historical Provider, MD  ferrous sulfate 325 (65 FE) MG tablet Take 325 mg by mouth daily.    Yes Historical Provider, MD  furosemide (LASIX) 40 MG tablet Take 40 mg by mouth See admin instructions. Take on Tuesday and thursday   Yes Historical Provider, MD  gabapentin (NEURONTIN) 100 MG capsule Take 100 mg by mouth 3 (three) times daily.  05/20/14  Yes Historical Provider, MD  isosorbide mononitrate (IMDUR) 30 MG 24 hr tablet Take 30 mg by mouth every morning.   Yes Historical Provider, MD  LamoTRIgine (LAMICTAL XR) 300 MG TB24 Take 450 mg by mouth daily.   Yes Historical Provider, MD  metoprolol tartrate (  LOPRESSOR) 25 MG tablet Take 25 mg by mouth 2 (two) times daily.   Yes Historical Provider, MD  nitroGLYCERIN (NITROSTAT) 0.4 MG SL tablet Place 0.4 mg under the tongue every 5 (five) minutes as needed for chest pain.   Yes Historical Provider, MD  phenytoin (DILANTIN) 100 MG ER capsule Take 300 mg by mouth at bedtime.   Yes Historical Provider, MD  phenytoin (DILANTIN) 30 MG ER capsule Take 30 mg by mouth at bedtime.   Yes Historical Provider, MD  potassium chloride (K-DUR,KLOR-CON) 10 MEQ tablet Take 10 mEq by mouth daily. Take on Tuesday and thursday   Yes Historical Provider, MD  spironolactone (ALDACTONE) 25 MG tablet Take 1 tablet (25 mg total) by mouth  daily. 04/18/15  Yes Reyne Dumas, MD    Family History Family History  Problem Relation Age of Onset  . Hypertension Mother   . Hypertension Father     Social History Social History  Substance Use Topics  . Smoking status: Never Smoker  . Smokeless tobacco: Not on file  . Alcohol use No     Allergies   Amoxicillin; Augmentin [amoxicillin-pot clavulanate]; Cephalexin; Erythromycin; Niacin and related; and Other   Review of Systems Review of Systems  All other systems reviewed and are negative.    Physical Exam Updated Vital Signs BP 183/82   Pulse 102   Temp 98.7 F (37.1 C)   Resp 23   SpO2 97%   Physical Exam  Constitutional: She is oriented to person, place, and time. She appears well-developed.  Elderly, frail  HENT:  Head: Normocephalic and atraumatic.  Eyes: Conjunctivae and EOM are normal. Pupils are equal, round, and reactive to light.  Neck: Normal range of motion and phonation normal. Neck supple.  Cardiovascular: Normal rate and regular rhythm.   Pulmonary/Chest: Effort normal and breath sounds normal. No respiratory distress. She has no wheezes. She has no rales. She exhibits no tenderness.  Abdominal: Soft. She exhibits no distension. There is no tenderness. There is no guarding.  Musculoskeletal: Normal range of motion.  Normal range of motion, arms and legs. The facial abrasion, left anterior knee. No deformity, left knee.  Neurological: She is alert and oriented to person, place, and time. She exhibits normal muscle tone.  No dysarthria and aphasia or nystagmus.  Skin: Skin is warm and dry.  Psychiatric: She has a normal mood and affect. Her behavior is normal. Judgment and thought content normal.  Nursing note and vitals reviewed.    ED Treatments / Results  Labs (all labs ordered are listed, but only abnormal results are displayed) Labs Reviewed  BASIC METABOLIC PANEL - Abnormal; Notable for the following:       Result Value   Sodium  131 (*)    Chloride 97 (*)    Glucose, Bld 247 (*)    All other components within normal limits  CBC - Abnormal; Notable for the following:    WBC 15.0 (*)    All other components within normal limits  URINALYSIS, ROUTINE W REFLEX MICROSCOPIC (NOT AT [facility]) - Abnormal; Notable for the following:    APPearance CLOUDY (*)    Protein, ur 30 (*)    Leukocytes, UA TRACE (*)    All other components within normal limits  URINE MICROSCOPIC-ADD ON - Abnormal; Notable for the following:    Squamous Epithelial / LPF 0-5 (*)    Bacteria, UA RARE (*)    All other components within normal limits    EKG  EKG Interpretation  Date/Time:  Thursday December 11 2015 18:01:44 EST Ventricular Rate:  92 PR Interval:  146 QRS Duration: 92 QT Interval:  358 QTC Calculation: 442 R Axis:   59 Text Interpretation:  Normal sinus rhythm ST & T wave abnormality, consider lateral ischemia Abnormal ECG since last tracing no significant change Confirmed by Eulis Foster  MD, Dionta Larke [id number]) on 12/11/2015 7:49:02 PM       Radiology Dg Chest 2 View  Result Date: 12/11/2015 CLINICAL DATA:  70 y/o F; increasing weakness since having the flu and pneumonia vaccines. Fell last night cough. EXAM: CHEST  2 VIEW COMPARISON:  04/16/2015 chest radiograph FINDINGS: Stable cardiac silhouette within normal limits. Anterior fusion hardware noted. No acute osseous abnormality is evident. Streaky opacities in left lung base probably represent atelectasis or scarring. No focal consolidation. No pneumothorax or effusion. IMPRESSION: Streaky opacities in left lung base probably represent atelectasis or scarring. No focal consolidation. No pneumothorax or effusion. Electronically Signed   By: Kristine Garbe M.D.   On: 12/11/2015 20:51    Procedures Procedures (including critical care time)  Medications Ordered in ED Medications  albuterol (PROVENTIL HFA;VENTOLIN HFA) 108 (90 Base) MCG/ACT inhaler 2 puff (not administered)    aerochamber Z-Stat Plus/medium 1 each (not administered)  ipratropium-albuterol (DUONEB) 0.5-2.5 (3) MG/3ML nebulizer solution 3 mL (3 mLs Nebulization Given 12/11/15 2308)  doxycycline (VIBRA-TABS) tablet 100 mg (100 mg Oral Given 12/11/15 2308)     Initial Impression / Assessment and Plan / ED Course  I have reviewed the triage vital signs and the nursing notes.  Pertinent labs & imaging results that were available during my care of the patient were reviewed by me and considered in my medical decision making (see chart for details).  Clinical Course as of Dec 11 12  Thu Dec 11, 2015  2248 Slightly low Sodium: (!) 131 [EW]  2249 Slightly low Chloride: (!) 97 [EW]  2249 High Glucose: (!) 247 [EW]  2249 High WBC: (!) 15.0 [EW]    Clinical Course User Index [EW] Daleen Bo, MD    Medications  albuterol (PROVENTIL HFA;VENTOLIN HFA) 108 (90 Base) MCG/ACT inhaler 2 puff (not administered)  aerochamber Z-Stat Plus/medium 1 each (not administered)  ipratropium-albuterol (DUONEB) 0.5-2.5 (3) MG/3ML nebulizer solution 3 mL (3 mLs Nebulization Given 12/11/15 2308)  doxycycline (VIBRA-TABS) tablet 100 mg (100 mg Oral Given 12/11/15 2308)    Patient Vitals for the past 24 hrs:  BP Temp Temp src Pulse Resp SpO2  12/11/15 2345 183/82 - - 102 - 97 %  12/11/15 2330 164/74 - - 101 - 99 %  12/11/15 2315 152/82 - - 97 - 100 %  12/11/15 2300 144/66 - - 104 - 92 %  12/11/15 2245 183/89 - - 95 23 100 %  12/11/15 2230 152/77 - - 98 (!) 31 99 %  12/11/15 2215 (!) 143/108 - - 92 24 97 %  12/11/15 2200 157/89 - - 97 (!) 27 99 %  12/11/15 2145 167/67 - - 97 (!) 27 99 %  12/11/15 2132 152/70 - - 97 25 100 %  12/11/15 2000 167/81 - - 90 21 100 %  12/11/15 1945 147/79 - - 92 (!) 29 99 %  12/11/15 1930 191/80 - - 90 25 99 %  12/11/15 1900 169/90 98.7 F (37.1 C) - 95 18 100 %  12/11/15 1806 163/77 99.3 F (37.4 C) Oral 91 18 100 %    12:14 AM Reevaluation with update and discussion. After  initial assessment and treatment, an updated evaluation reveals She is more comfortable at this time and has no further complaints. Findings discussed with patient and family members, all questions answered. Teriah Muela L    Final Clinical Impressions(s) / ED Diagnoses   Final diagnoses:  Acute bronchitis, unspecified organism    Nonspecific malaise and weakness. Patient has ongoing cough despite recent treatment with antibiotics. She feels better after treatment with nebulizer. Vital signs improved. Doubt pneumonia, ACS, PE, or impending vascular collapse.  Nursing Notes Reviewed/ Care Coordinated Applicable Imaging Reviewed Interpretation of Laboratory Data incorporated into ED treatment  The patient appears reasonably screened and/or stabilized for discharge and I doubt any other medical condition or other [facility] requiring further screening, evaluation, or treatment in the ED at this time prior to discharge.  Plan: Home Medications- continue; Home Treatments- rest; return here if the recommended treatment, does not improve the symptoms; Recommended follow up- PCP prn    New Prescriptions New Prescriptions   No medications on file     Daleen Bo, MD 12/12/15 [phone]

## 2015-12-12 ENCOUNTER — Inpatient Hospital Stay (HOSPITAL_COMMUNITY): Payer: Medicare Other

## 2015-12-12 ENCOUNTER — Encounter (HOSPITAL_COMMUNITY): Payer: Self-pay

## 2015-12-12 DIAGNOSIS — G40909 Epilepsy, unspecified, not intractable, without status epilepticus: Secondary | ICD-10-CM | POA: Diagnosis present

## 2015-12-12 DIAGNOSIS — R531 Weakness: Secondary | ICD-10-CM | POA: Diagnosis present

## 2015-12-12 DIAGNOSIS — Z8249 Family history of ischemic heart disease and other diseases of the circulatory system: Secondary | ICD-10-CM | POA: Diagnosis not present

## 2015-12-12 DIAGNOSIS — I5032 Chronic diastolic (congestive) heart failure: Secondary | ICD-10-CM | POA: Diagnosis present

## 2015-12-12 DIAGNOSIS — M25561 Pain in right knee: Secondary | ICD-10-CM | POA: Diagnosis present

## 2015-12-12 DIAGNOSIS — R000 Tachycardia, unspecified: Secondary | ICD-10-CM | POA: Diagnosis present

## 2015-12-12 DIAGNOSIS — I69398 Other sequelae of cerebral infarction: Secondary | ICD-10-CM | POA: Diagnosis not present

## 2015-12-12 DIAGNOSIS — I252 Old myocardial infarction: Secondary | ICD-10-CM | POA: Diagnosis not present

## 2015-12-12 DIAGNOSIS — I69311 Memory deficit following cerebral infarction: Secondary | ICD-10-CM | POA: Diagnosis not present

## 2015-12-12 DIAGNOSIS — Z79899 Other long term (current) drug therapy: Secondary | ICD-10-CM | POA: Diagnosis not present

## 2015-12-12 DIAGNOSIS — I119 Hypertensive heart disease without heart failure: Secondary | ICD-10-CM | POA: Diagnosis present

## 2015-12-12 DIAGNOSIS — E119 Type 2 diabetes mellitus without complications: Secondary | ICD-10-CM

## 2015-12-12 DIAGNOSIS — E1149 Type 2 diabetes mellitus with other diabetic neurological complication: Secondary | ICD-10-CM

## 2015-12-12 DIAGNOSIS — Z7902 Long term (current) use of antithrombotics/antiplatelets: Secondary | ICD-10-CM | POA: Diagnosis not present

## 2015-12-12 DIAGNOSIS — J209 Acute bronchitis, unspecified: Secondary | ICD-10-CM

## 2015-12-12 DIAGNOSIS — J189 Pneumonia, unspecified organism: Secondary | ICD-10-CM | POA: Diagnosis present

## 2015-12-12 DIAGNOSIS — I10 Essential (primary) hypertension: Secondary | ICD-10-CM

## 2015-12-12 DIAGNOSIS — Z66 Do not resuscitate: Secondary | ICD-10-CM | POA: Diagnosis present

## 2015-12-12 DIAGNOSIS — Z7982 Long term (current) use of aspirin: Secondary | ICD-10-CM | POA: Diagnosis not present

## 2015-12-12 DIAGNOSIS — I110 Hypertensive heart disease with heart failure: Secondary | ICD-10-CM | POA: Diagnosis present

## 2015-12-12 DIAGNOSIS — J181 Lobar pneumonia, unspecified organism: Secondary | ICD-10-CM

## 2015-12-12 DIAGNOSIS — W010XXA Fall on same level from slipping, tripping and stumbling without subsequent striking against object, initial encounter: Secondary | ICD-10-CM | POA: Diagnosis present

## 2015-12-12 DIAGNOSIS — M25562 Pain in left knee: Secondary | ICD-10-CM | POA: Diagnosis present

## 2015-12-12 DIAGNOSIS — I6529 Occlusion and stenosis of unspecified carotid artery: Secondary | ICD-10-CM | POA: Diagnosis present

## 2015-12-12 DIAGNOSIS — M25511 Pain in right shoulder: Secondary | ICD-10-CM | POA: Diagnosis present

## 2015-12-12 DIAGNOSIS — I2510 Atherosclerotic heart disease of native coronary artery without angina pectoris: Secondary | ICD-10-CM | POA: Diagnosis present

## 2015-12-12 DIAGNOSIS — E1151 Type 2 diabetes mellitus with diabetic peripheral angiopathy without gangrene: Secondary | ICD-10-CM | POA: Diagnosis present

## 2015-12-12 LAB — CBC WITH DIFFERENTIAL/PLATELET
BASOS ABS: 0 10*3/uL (ref 0.0–0.1)
BASOS PCT: 0 %
EOS ABS: 0 10*3/uL (ref 0.0–0.7)
Eosinophils Relative: 0 %
HEMATOCRIT: 33.4 % — AB (ref 36.0–46.0)
HEMOGLOBIN: 11 g/dL — AB (ref 12.0–15.0)
Lymphocytes Relative: 10 %
Lymphs Abs: 1.2 10*3/uL (ref 0.7–4.0)
MCH: 27.6 pg (ref 26.0–34.0)
MCHC: 32.9 g/dL (ref 30.0–36.0)
MCV: 83.9 fL (ref 78.0–100.0)
MONO ABS: 1.3 10*3/uL — AB (ref 0.1–1.0)
Monocytes Relative: 11 %
NEUTROS ABS: 9.6 10*3/uL — AB (ref 1.7–7.7)
NEUTROS PCT: 79 %
Platelets: 167 10*3/uL (ref 150–400)
RBC: 3.98 MIL/uL (ref 3.87–5.11)
RDW: 13.9 % (ref 11.5–15.5)
WBC: 12.1 10*3/uL — ABNORMAL HIGH (ref 4.0–10.5)

## 2015-12-12 LAB — COMPREHENSIVE METABOLIC PANEL
ALK PHOS: 83 U/L (ref 38–126)
ALT: 19 U/L (ref 14–54)
ANION GAP: 10 (ref 5–15)
AST: 25 U/L (ref 15–41)
Albumin: 3.2 g/dL — ABNORMAL LOW (ref 3.5–5.0)
BILIRUBIN TOTAL: 0.4 mg/dL (ref 0.3–1.2)
BUN: 9 mg/dL (ref 6–20)
CALCIUM: 8.8 mg/dL — AB (ref 8.9–10.3)
CO2: 20 mmol/L — ABNORMAL LOW (ref 22–32)
CREATININE: 0.68 mg/dL (ref 0.44–1.00)
Chloride: 106 mmol/L (ref 101–111)
GFR calc non Af Amer: >60 mL/min (ref >60)
Glucose, Bld: 156 mg/dL — ABNORMAL HIGH (ref 65–99)
Potassium: 3.6 mmol/L (ref 3.5–5.1)
Sodium: 136 mmol/L (ref 135–145)
TOTAL PROTEIN: 5.8 g/dL — AB (ref 6.5–8.1)

## 2015-12-12 LAB — GLUCOSE, CAPILLARY: GLUCOSE-CAPILLARY: 208 mg/dL — AB (ref 65–99)

## 2015-12-12 LAB — TROPONIN I
TROPONIN I: 0.23 ng/mL — AB (ref <0.03)
Troponin I: 0.05 ng/mL (ref <0.03)
Troponin I: 0.03 ng/mL (ref <0.03)

## 2015-12-12 LAB — I-STAT CG4 LACTIC ACID, ED: LACTIC ACID, VENOUS: 1.2 mmol/L (ref 0.5–1.9)

## 2015-12-12 LAB — STREP PNEUMONIAE URINARY ANTIGEN: STREP PNEUMO URINARY ANTIGEN: NEGATIVE

## 2015-12-12 LAB — PHENYTOIN LEVEL, TOTAL: Phenytoin Lvl: 6.8 ug/mL — ABNORMAL LOW (ref 10.0–20.0)

## 2015-12-12 MED ORDER — FERROUS SULFATE 325 (65 FE) MG PO TABS
325.0000 mg | ORAL_TABLET | Freq: Every day | ORAL | Status: DC
  Administered 2015-12-12 – 2015-12-13 (×2): 325 mg via ORAL
  Filled 2015-12-12 (×2): qty 1

## 2015-12-12 MED ORDER — LAMOTRIGINE 150 MG PO TABS
300.0000 mg | ORAL_TABLET | Freq: Two times a day (BID) | ORAL | Status: DC
  Administered 2015-12-13: 300 mg via ORAL
  Filled 2015-12-12 (×2): qty 2

## 2015-12-12 MED ORDER — ATORVASTATIN CALCIUM 80 MG PO TABS
80.0000 mg | ORAL_TABLET | Freq: Every day | ORAL | Status: DC
  Administered 2015-12-12 – 2015-12-13 (×2): 80 mg via ORAL
  Filled 2015-12-12 (×2): qty 1

## 2015-12-12 MED ORDER — NITROGLYCERIN 0.4 MG SL SUBL: 0.4000 mg | SUBLINGUAL_TABLET | SUBLINGUAL | Status: DC | PRN

## 2015-12-12 MED ORDER — INSULIN ASPART 100 UNIT/ML ~~LOC~~ SOLN
0.0000 [IU] | Freq: Three times a day (TID) | SUBCUTANEOUS | Status: DC
  Administered 2015-12-13: 2 [IU] via SUBCUTANEOUS

## 2015-12-12 MED ORDER — LAMOTRIGINE 150 MG PO TABS
300.0000 mg | ORAL_TABLET | Freq: Two times a day (BID) | ORAL | Status: DC
  Filled 2015-12-12 (×2): qty 2

## 2015-12-12 MED ORDER — ASPIRIN 81 MG PO CHEW
81.0000 mg | CHEWABLE_TABLET | Freq: Every day | ORAL | Status: DC
  Administered 2015-12-12 – 2015-12-13 (×2): 81 mg via ORAL
  Filled 2015-12-12 (×2): qty 1

## 2015-12-12 MED ORDER — ONDANSETRON HCL 4 MG/2ML IJ SOLN: 4.0000 mg | Freq: Four times a day (QID) | INTRAMUSCULAR | Status: DC | PRN

## 2015-12-12 MED ORDER — LEVOFLOXACIN IN D5W 500 MG/100ML IV SOLN
500.0000 mg | Freq: Once | INTRAVENOUS | Status: AC
  Administered 2015-12-12: 500 mg via INTRAVENOUS
  Filled 2015-12-12: qty 100

## 2015-12-12 MED ORDER — LAMOTRIGINE ER 300 MG PO TB24: 450.0000 mg | ORAL_TABLET | Freq: Every day | ORAL | Status: DC

## 2015-12-12 MED ORDER — ENOXAPARIN SODIUM 40 MG/0.4ML ~~LOC~~ SOLN
40.0000 mg | SUBCUTANEOUS | Status: DC
  Administered 2015-12-12: 40 mg via SUBCUTANEOUS
  Filled 2015-12-12: qty 0.4

## 2015-12-12 MED ORDER — ISOSORBIDE MONONITRATE ER 30 MG PO TB24
30.0000 mg | ORAL_TABLET | Freq: Every day | ORAL | Status: DC
  Administered 2015-12-12 – 2015-12-13 (×2): 30 mg via ORAL
  Filled 2015-12-12 (×2): qty 1

## 2015-12-12 MED ORDER — ALBUTEROL SULFATE HFA 108 (90 BASE) MCG/ACT IN AERS: 2.0000 | INHALATION_SPRAY | RESPIRATORY_TRACT | Status: DC | PRN

## 2015-12-12 MED ORDER — ACETAMINOPHEN 650 MG RE SUPP
650.0000 mg | Freq: Four times a day (QID) | RECTAL | Status: DC | PRN
Start: 2015-12-12 — End: 2015-12-13

## 2015-12-12 MED ORDER — SODIUM CHLORIDE 0.9 % IV BOLUS (SEPSIS)
1000.0000 mL | Freq: Once | INTRAVENOUS | Status: AC
  Administered 2015-12-12: 1000 mL via INTRAVENOUS

## 2015-12-12 MED ORDER — METOPROLOL TARTRATE 25 MG PO TABS
25.0000 mg | ORAL_TABLET | Freq: Two times a day (BID) | ORAL | Status: DC
  Administered 2015-12-12 – 2015-12-13 (×3): 25 mg via ORAL
  Filled 2015-12-12 (×3): qty 1

## 2015-12-12 MED ORDER — IPRATROPIUM-ALBUTEROL 0.5-2.5 (3) MG/3ML IN SOLN: 3.0000 mL | RESPIRATORY_TRACT | 1 refills | Status: DC | PRN

## 2015-12-12 MED ORDER — PHENYTOIN SODIUM EXTENDED 30 MG PO CAPS
30.0000 mg | ORAL_CAPSULE | Freq: Every day | ORAL | Status: DC
  Administered 2015-12-12: 30 mg via ORAL
  Filled 2015-12-12: qty 1

## 2015-12-12 MED ORDER — SODIUM CHLORIDE 0.9 % IV SOLN
INTRAVENOUS | Status: AC
  Administered 2015-12-12 (×2): via INTRAVENOUS

## 2015-12-12 MED ORDER — CLOPIDOGREL BISULFATE 75 MG PO TABS
75.0000 mg | ORAL_TABLET | Freq: Every day | ORAL | Status: DC
  Administered 2015-12-12 – 2015-12-13 (×2): 75 mg via ORAL
  Filled 2015-12-12 (×2): qty 1

## 2015-12-12 MED ORDER — ACETAMINOPHEN 325 MG PO TABS: 650.0000 mg | ORAL_TABLET | Freq: Four times a day (QID) | ORAL | Status: DC | PRN

## 2015-12-12 MED ORDER — SODIUM CHLORIDE 0.9 % IV SOLN
INTRAVENOUS | Status: DC
  Administered 2015-12-12: 03:00:00 via INTRAVENOUS

## 2015-12-12 MED ORDER — ONDANSETRON HCL 4 MG PO TABS: 4.0000 mg | ORAL_TABLET | Freq: Four times a day (QID) | ORAL | Status: DC | PRN

## 2015-12-12 MED ORDER — ALBUTEROL SULFATE (2.5 MG/3ML) 0.083% IN NEBU: 2.5000 mg | INHALATION_SOLUTION | RESPIRATORY_TRACT | Status: DC | PRN

## 2015-12-12 MED ORDER — PHENYTOIN SODIUM EXTENDED 100 MG PO CAPS
300.0000 mg | ORAL_CAPSULE | Freq: Every day | ORAL | Status: DC
  Administered 2015-12-12: 300 mg via ORAL
  Filled 2015-12-12: qty 3

## 2015-12-12 MED ORDER — AMLODIPINE BESYLATE 10 MG PO TABS
10.0000 mg | ORAL_TABLET | Freq: Every day | ORAL | Status: DC
  Administered 2015-12-12 – 2015-12-13 (×2): 10 mg via ORAL
  Filled 2015-12-12 (×2): qty 1

## 2015-12-12 MED ORDER — POTASSIUM CHLORIDE CRYS ER 10 MEQ PO TBCR
10.0000 meq | EXTENDED_RELEASE_TABLET | Freq: Every day | ORAL | Status: DC
  Administered 2015-12-12 – 2015-12-13 (×2): 10 meq via ORAL
  Filled 2015-12-12 (×2): qty 1

## 2015-12-12 MED ORDER — AEROCHAMBER PLUS W/MASK MISC: 1.0000 | Freq: Once | Status: DC

## 2015-12-12 MED ORDER — LEVOFLOXACIN IN D5W 750 MG/150ML IV SOLN
750.0000 mg | INTRAVENOUS | Status: DC
  Administered 2015-12-13: 750 mg via INTRAVENOUS
  Filled 2015-12-12: qty 150

## 2015-12-12 MED ORDER — VITAMIN D 1000 UNITS PO TABS
2000.0000 [IU] | ORAL_TABLET | Freq: Three times a day (TID) | ORAL | Status: DC
  Administered 2015-12-12 – 2015-12-13 (×3): 2000 [IU] via ORAL
  Filled 2015-12-12 (×3): qty 2

## 2015-12-12 MED ORDER — GABAPENTIN 100 MG PO CAPS
100.0000 mg | ORAL_CAPSULE | Freq: Three times a day (TID) | ORAL | Status: DC
  Administered 2015-12-12 – 2015-12-13 (×3): 100 mg via ORAL
  Filled 2015-12-12 (×3): qty 1

## 2015-12-12 MED ORDER — DOXYCYCLINE HYCLATE 100 MG PO TABS: 100.0000 mg | ORAL_TABLET | Freq: Two times a day (BID) | ORAL | 0 refills | Status: DC

## 2015-12-12 NOTE — ED Notes (Signed)
Nurse getting 2nd set of blood cultures 

## 2015-12-12 NOTE — ED Provider Notes (Signed)
Patient discharged by previous shift. Family unwilling to take her home as she is too weak and cannot walk or transfer which is her baseline. She is persistently tachycardic and has possible pneumonia on x-ray.  Will treat for CAP. Plan admission for hydration, antibiotics, PT/OT. D/w Dr. Hal Hope.  BP (!) 185/70 (BP Location: Left Arm)   Pulse (!) 108   Temp 98.8 F (37.1 C) (Oral)   Resp 18   Ht 5\' 1"  (1.549 m)   Wt 143 lb 12.8 oz (65.2 kg)   SpO2 99%   BMI 27.17 kg/m     Ezequiel Essex, MD 12/12/15 [phone]

## 2015-12-12 NOTE — ED Notes (Signed)
ED Provider at bedside. 

## 2015-12-12 NOTE — Progress Notes (Signed)
PROGRESS NOTE    Kathryn Lewis  [id number] DOB: 14-Nov-1945 DOA: 12/11/2015 PCP: No PCP Per Patient   Subjective: Patient is doing better. Has chest pain with inspiration and nonproductive cough that is improving. Does not remember a lot about why she was admitted to the hospital.  Brief Narrative:  Kathryn Lewis is a 70 y.o. female with history of stroke, seizures, CAD, CHF, diabetes mellitus was brought to the ER 11/16 after patient was feeling weak. Patient's daughter provided the history as patient has memory issues from previous stroke, stated that patient 2 weeks ago was diagnosed with bronchitis and was on Z-Pak. Patient felt weak after receiving flu shot 11/14. On 11/15 evening patient fell but did not hit her head or lose consciousness. Since then patient has been getting progressively weak. In the ER patient is found to be tachycardic. Chest x-ray shows likely atelectasis or scarring in left lung base. XR of right shoulder, right knee, left knee, and pelvis are negative for acute abnormality.    Assessment & Plan:   Principal Problem:   CAP (community acquired pneumonia) Active Problems:   Seizure disorder as sequela of cerebrovascular accident [facility])   Generalized weakness   Diabetes mellitus type 2 in nonobese [facility])   Essential hypertension   Pneumonia   Possible pneumonia  - Patient was placed on Levaquin for community-acquired pneumonia.  - Chest x-ray shows likely atelectasis or scarring in left lung base. - WBC 15.0 11/16 and 12.1 today - Cultures for Legionella strep antigens pending. - I'll probably hold antibiotics, restart if she develop fever or worsened clinically.  Generalized weakness and fall - Possibly from deconditioning  - XR of right shoulder, right knee, left knee, and pelvis are negative for acute abnormality.  - Will hold Lasix and spironolactone and gently hydrated. Get physical therapy consult. - Troponin 0.23 on 11/17 - Blood culture  pending  Diastolic CHF  - last EF measured in March 2017 was 55-60% with grade 1 diastolic dysfunction  - Diuretics have been held due to weakness - Denies shortness of breath  CAD  - Denies chest pain, except with inspiration - Troponin 0.23 on 11/17  Hypertension - on amlodipine and Imdur.  History of stroke with seizures  - Continue antiplatelet agents, and antiseizure medications and statins. - Phenytoin level 6.8  DVT prophylaxis: Lovenox Code Status: DNR Family Communication: None at bedside Disposition Plan: Remain inpatient   Consultants:   None  Procedures:   None  Antimicrobials:  Levaquin 12/12/15-12/18/15     Objective: Vitals:   12/12/15 0300 12/12/15 0315 12/12/15 0419 12/12/15 1104  BP: 125/56 142/59 (!) 185/70 (!) 153/70  Pulse: 99 98 (!) 108 88  Resp: 21 19 18 19   Temp:   98.8 F (37.1 C) 99.4 F (37.4 C)  TempSrc:   Oral Oral  SpO2: 95% 99% 99% 98%  Weight:   65.2 kg (143 lb 12.8 oz)   Height:   5\' 1"  (1.549 m)     Intake/Output Summary (Last 24 hours) at 12/12/15 1157 Last data filed at 12/12/15 0930  Gross per 24 hour  Intake              580 ml  Output              200 ml  Net              380 ml   Filed Weights   12/12/15 0419  Weight: 65.2 kg (143 lb 12.8 oz)  Examination:  General exam: Appears slightly uncomfortable but calm Respiratory system: Clear to auscultation. Respiratory effort normal. Cardiovascular system: S1 & S2 heard, RRR. No JVD, murmurs, rubs, gallops or clicks. No pedal edema. Gastrointestinal system: Abdomen is nondistended, soft and nontender. No organomegaly or masses felt.  Central nervous system: Alert and oriented. No focal neurological deficits. Skin: No rashes, lesions or ulcers Psychiatry: Judgement and insight appear normal. Mood & affect appropriate.     Data Reviewed: I have personally reviewed following labs and imaging studies  CBC:  Recent Labs Lab 12/11/15 1816 12/12/15 0648   WBC 15.0* 12.1*  NEUTROABS  --  9.6*  HGB 12.2 11.0*  HCT 36.7 33.4*  MCV 84.0 83.9  PLT 198 [id number]   Basic Metabolic Panel:  Recent Labs Lab 12/11/15 1816 12/12/15 0648  NA 131* 136  K 4.1 3.6  CL 97* 106  CO2 23 20*  GLUCOSE 247* 156*  BUN 12 9  CREATININE 0.88 0.68  CALCIUM 9.3 8.8*   GFR: Estimated Creatinine Clearance: 56.6 mL/min (by C-G formula based on SCr of 0.68 mg/dL). Liver Function Tests:  Recent Labs Lab 12/12/15 0648  AST 25  ALT 19  ALKPHOS 83  BILITOT 0.4  PROT 5.8*  ALBUMIN 3.2*   No results for input(s): LIPASE, AMYLASE in the last 168 hours. No results for input(s): AMMONIA in the last 168 hours. Coagulation Profile: No results for input(s): INR, PROTIME in the last 168 hours. Cardiac Enzymes:  Recent Labs Lab 12/12/15 0648  TROPONINI 0.23*   BNP (last 3 results) No results for input(s): PROBNP in the last 8760 hours. HbA1C: No results for input(s): HGBA1C in the last 72 hours. CBG: No results for input(s): GLUCAP in the last 168 hours. Lipid Profile: No results for input(s): CHOL, HDL, LDLCALC, TRIG, CHOLHDL, LDLDIRECT in the last 72 hours. Thyroid Function Tests: No results for input(s): TSH, T4TOTAL, FREET4, T3FREE, THYROIDAB in the last 72 hours. Anemia Panel: No results for input(s): VITAMINB12, FOLATE, FERRITIN, TIBC, IRON, RETICCTPCT in the last 72 hours. Sepsis Labs:  Recent Labs Lab 12/12/15 0224  LATICACIDVEN 1.20    No results found for this or any previous visit (from the past 240 hour(s)).       Radiology Studies: Dg Chest 2 View  Result Date: 12/11/2015 CLINICAL DATA:  70 y/o F; increasing weakness since having the flu and pneumonia vaccines. Fell last night cough. EXAM: CHEST  2 VIEW COMPARISON:  04/16/2015 chest radiograph FINDINGS: Stable cardiac silhouette within normal limits. Anterior fusion hardware noted. No acute osseous abnormality is evident. Streaky opacities in left lung base probably represent  atelectasis or scarring. No focal consolidation. No pneumothorax or effusion. IMPRESSION: Streaky opacities in left lung base probably represent atelectasis or scarring. No focal consolidation. No pneumothorax or effusion. Electronically Signed   By: Kristine Garbe M.D.   On: 12/11/2015 20:51   Dg Pelvis 1-2 Views  Result Date: 12/12/2015 CLINICAL DATA:  Status post slip and fall 12/10/2015. Pain and increasing weakness. Initial encounter. EXAM: PELVIS - 1-2 VIEW COMPARISON:  None. FINDINGS: There is no evidence of pelvic fracture or diastasis. No pelvic bone lesions are seen. Atherosclerotic vascular disease is noted. IMPRESSION: No acute abnormality. Atherosclerosis. Electronically Signed   By: Inge Rise M.D.   On: 12/12/2015 10:20   Dg Shoulder Right  Result Date: 12/12/2015 CLINICAL DATA:  Fall 2 days ago right shoulder pain. EXAM: RIGHT SHOULDER - 2+ VIEW COMPARISON:  None. FINDINGS: There is no evidence of  fracture or dislocation. There is no evidence of arthropathy or other focal bone abnormality. Soft tissues are unremarkable. IMPRESSION: Negative. Electronically Signed   By: Monte Fantasia M.D.   On: 12/12/2015 10:19   Dg Knee 1-2 Views Left  Result Date: 12/12/2015 CLINICAL DATA:  Status post fall 12/10/2015. Left knee pain and increasing weakness. Initial encounter. EXAM: LEFT KNEE - 1-2 VIEW COMPARISON:  None. FINDINGS: No evidence of fracture, dislocation, or joint effusion. No evidence of arthropathy or other focal bone abnormality. Atherosclerotic vascular disease is noted. IMPRESSION: Negative exam. Electronically Signed   By: Inge Rise M.D.   On: 12/12/2015 10:22   Dg Knee 1-2 Views Right  Result Date: 12/12/2015 CLINICAL DATA:  Status post fall 12/10/2015. Right knee pain increasing weakness. EXAM: RIGHT KNEE - 1-2 VIEW COMPARISON:  None. FINDINGS: No evidence of fracture, dislocation, or joint effusion. No evidence of arthropathy or other focal bone  abnormality. Atherosclerotic vascular disease is noted. IMPRESSION: Negative exam. Electronically Signed   By: Inge Rise M.D.   On: 12/12/2015 10:21        Scheduled Meds: . aerochamber plus with mask  1 each Other Once  . amLODipine  10 mg Oral Daily  . aspirin  81 mg Oral Daily  . atorvastatin  80 mg Oral Daily  . cholecalciferol  2,000 Units Oral TID  . clopidogrel  75 mg Oral Daily  . enoxaparin (LOVENOX) injection  40 mg Subcutaneous Q24H  . ferrous sulfate  325 mg Oral Daily  . gabapentin  100 mg Oral TID  . insulin aspart  0-9 Units Subcutaneous TID WC  . isosorbide mononitrate  30 mg Oral Daily  . LamoTRIgine  600 mg Oral Daily  . [START ON 12/13/2015] levofloxacin (LEVAQUIN) IV  750 mg Intravenous Q24H  . metoprolol tartrate  25 mg Oral BID  . phenytoin  30 mg Oral QHS  . phenytoin  300 mg Oral QHS  . potassium chloride  10 mEq Oral Daily   Continuous Infusions: . sodium chloride 75 mL/hr at 12/12/15 0656     LOS: 0 days    Time spent: [place], PA-S  Wrote part of this note [facility] Pager [phone]  If 7PM-7AM, please contact night-coverage [email or web address] Password [email or web address] 12/12/2015, 11:57 AM   Birdie Hopes Pager: [phone] 12/12/2015, 4:05 PM

## 2015-12-12 NOTE — H&P (Signed)
History and Physical    Kathryn Lewis [id number] DOB: 1945-04-24 DOA: 12/11/2015  PCP: No PCP Per Patient  Patient coming from: Home.  Chief Complaint: Weakness.  HPI: Kathryn Lewis is a 70 y.o. female with history of stroke, seizures, CAD, CHF, diabetes mellitus was brought to the ER after patient was feeling weak. Patient's daughter provided the history as patient has memory issues from previous stroke, states that patient 2 weeks ago was diagnosed with bronchitis and was on Z-Pak. 3 days ago patient had follow-up with the primary care and at that time had received flu shot following which patient started getting weak. 2 days ago, on November 15 evening patient slipped and fell but did not hit her head or lose consciousness. Since patient has been getting progressively weak and patient was brought to the ER. In the ER patient is found to be tachycardic. Chest x-ray shows infiltrates. Patient otherwise denies any chest pain or shortness of breath. Has been having nonproductive cough. Patient is being admitted for generalized weakness and pneumonia. On exam patient has some pain in the right shoulder. Patient also has some skin contusion on the knee from the fall.   ED Course: Patient was given fluid bolus and started on antibiotics for pneumonia.  Review of Systems: As per HPI, rest all negative.   Past Medical History:  Diagnosis Date  . Bipolar 1 disorder ([place])   . Carotid artery stenosis    50-69% bilateral ICA stenoses by velocity criteria but no visible plaque and ICA/CCA ratio 1.72 on right and 1.38 on left  . Coronary artery disease   . Hypertension   . Left adrenal mass ([facility])    2.5 cm L adrenal mass on CT per note of Dr. Seleta Rhymes 05/20/2014  . MI, old   . PAD (peripheral artery disease) ([place])    03/2014 - CT aortogram with lower extremity runoff (mild to moderate disease in common iliac arteries, complete occulusion of her bilateral external iliac arteries with heavily disease  common femoral arteries. She also has disease to her SFAs bilaterally. Popliteal arteries and tibial vessel runoff appears to be adequate)  . Stroke [facility])     Past Surgical History:  Procedure Laterality Date  . BREAST SURGERY    . NECK SURGERY       reports that she has never smoked. She has never used smokeless tobacco. She reports that she does not drink alcohol. Her drug history is not on file.  Allergies  Allergen Reactions  . Amoxicillin Other (See Comments)    colitis  . Augmentin [Amoxicillin-Pot Clavulanate]     colitis  . Cephalexin Other (See Comments)    colitis  . Erythromycin Other (See Comments)    colitis  . Niacin And Related Other (See Comments)    colitis  . Other     Family History  Problem Relation Age of Onset  . Hypertension Mother   . Hypertension Father     Prior to Admission medications   Medication Sig Start Date End Date Taking? Authorizing Provider  amLODipine (NORVASC) 10 MG tablet Take 1 tablet (10 mg total) by mouth daily. 04/18/15  Yes Reyne Dumas, MD  aspirin 81 MG chewable tablet Chew 81 mg by mouth daily.   Yes Historical Provider, MD  atorvastatin (LIPITOR) 80 MG tablet Take 80 mg by mouth daily.   Yes Historical Provider, MD  Cholecalciferol (VITAMIN D) 2000 units CAPS Take 2,000 capsules by mouth 3 (three) times daily.  Yes Historical Provider, MD  clopidogrel (PLAVIX) 75 MG tablet Take 75 mg by mouth daily.   Yes Historical Provider, MD  ferrous sulfate 325 (65 FE) MG tablet Take 325 mg by mouth daily.    Yes Historical Provider, MD  furosemide (LASIX) 40 MG tablet Take 40 mg by mouth See admin instructions. Take on Tuesday and thursday   Yes Historical Provider, MD  gabapentin (NEURONTIN) 100 MG capsule Take 100 mg by mouth 3 (three) times daily.  05/20/14  Yes Historical Provider, MD  isosorbide mononitrate (IMDUR) 30 MG 24 hr tablet Take 30 mg by mouth every morning.   Yes Historical Provider, MD  LamoTRIgine (LAMICTAL XR) 300 MG  TB24 Take 450 mg by mouth daily.   Yes Historical Provider, MD  metoprolol tartrate (LOPRESSOR) 25 MG tablet Take 25 mg by mouth 2 (two) times daily.   Yes Historical Provider, MD  nitroGLYCERIN (NITROSTAT) 0.4 MG SL tablet Place 0.4 mg under the tongue every 5 (five) minutes as needed for chest pain.   Yes Historical Provider, MD  phenytoin (DILANTIN) 100 MG ER capsule Take 300 mg by mouth at bedtime.   Yes Historical Provider, MD  phenytoin (DILANTIN) 30 MG ER capsule Take 30 mg by mouth at bedtime.   Yes Historical Provider, MD  potassium chloride (K-DUR,KLOR-CON) 10 MEQ tablet Take 10 mEq by mouth daily. Take on Tuesday and thursday   Yes Historical Provider, MD  spironolactone (ALDACTONE) 25 MG tablet Take 1 tablet (25 mg total) by mouth daily. 04/18/15  Yes Reyne Dumas, MD  doxycycline (VIBRA-TABS) 100 MG tablet Take 1 tablet (100 mg total) by mouth 2 (two) times daily. 12/12/15   Daleen Bo, MD  ipratropium-albuterol (DUONEB) 0.5-2.5 (3) MG/3ML SOLN Take 3 mLs by nebulization every 4 (four) hours as needed. 12/12/15   Daleen Bo, MD    Physical Exam: Vitals:   12/12/15 0255 12/12/15 0300 12/12/15 0315 12/12/15 0419  BP: (!) 136/49 125/56 142/59 (!) 185/70  Pulse: 101 99 98 (!) 108  Resp: 15 21 19 18   Temp:    98.8 F (37.1 C)  TempSrc:    Oral  SpO2: 97% 95% 99% 99%  Weight:    65.2 kg (143 lb 12.8 oz)  Height:    5\' 1"  (1.549 m)      Constitutional: Moderately built and nourished. Vitals:   12/12/15 0255 12/12/15 0300 12/12/15 0315 12/12/15 0419  BP: (!) 136/49 125/56 142/59 (!) 185/70  Pulse: 101 99 98 (!) 108  Resp: 15 21 19 18   Temp:    98.8 F (37.1 C)  TempSrc:    Oral  SpO2: 97% 95% 99% 99%  Weight:    65.2 kg (143 lb 12.8 oz)  Height:    5\' 1"  (1.549 m)   Eyes: Anicteric. No pallor. ENMT: No discharge from the ears eyes nose and mouth. Neck: No mass felt. No JVD appreciated. Respiratory: No rhonchi or crepitations. Cardiovascular: S1 and S2 heard  tachycardic. No murmurs appreciated. Abdomen: Soft nontender bowel sounds present. No guarding or rigidity. Musculoskeletal: Pain on moving right shoulder. Skin: Skin tears. Neurologic: Alert awake oriented to name and place. Moves all extremities. Has some visual deficits in certain fields from previous stroke. Psychiatric: Has some memory issues from previous stroke.   Labs on Admission: I have personally reviewed following labs and imaging studies  CBC:  Recent Labs Lab 12/11/15 1816  WBC 15.0*  HGB 12.2  HCT 36.7  MCV 84.0  PLT 198  Basic Metabolic Panel:  Recent Labs Lab 12/11/15 1816  NA 131*  K 4.1  CL 97*  CO2 23  GLUCOSE 247*  BUN 12  CREATININE 0.88  CALCIUM 9.3   GFR: Estimated Creatinine Clearance: 51.5 mL/min (by C-G formula based on SCr of 0.88 mg/dL). Liver Function Tests: No results for input(s): AST, ALT, ALKPHOS, BILITOT, PROT, ALBUMIN in the last 168 hours. No results for input(s): LIPASE, AMYLASE in the last 168 hours. No results for input(s): AMMONIA in the last 168 hours. Coagulation Profile: No results for input(s): INR, PROTIME in the last 168 hours. Cardiac Enzymes: No results for input(s): CKTOTAL, CKMB, CKMBINDEX, TROPONINI in the last 168 hours. BNP (last 3 results) No results for input(s): PROBNP in the last 8760 hours. HbA1C: No results for input(s): HGBA1C in the last 72 hours. CBG: No results for input(s): GLUCAP in the last 168 hours. Lipid Profile: No results for input(s): CHOL, HDL, LDLCALC, TRIG, CHOLHDL, LDLDIRECT in the last 72 hours. Thyroid Function Tests: No results for input(s): TSH, T4TOTAL, FREET4, T3FREE, THYROIDAB in the last 72 hours. Anemia Panel: No results for input(s): VITAMINB12, FOLATE, FERRITIN, TIBC, IRON, RETICCTPCT in the last 72 hours. Urine analysis:    Component Value Date/Time   COLORURINE YELLOW 12/11/2015 1945   APPEARANCEUR CLOUDY (A) 12/11/2015 1945   LABSPEC 1.012 12/11/2015 1945    PHURINE 6.0 12/11/2015 1945   GLUCOSEU NEGATIVE 12/11/2015 1945   HGBUR NEGATIVE 12/11/2015 1945   BILIRUBINUR NEGATIVE 12/11/2015 1945   KETONESUR NEGATIVE 12/11/2015 1945   PROTEINUR 30 (A) 12/11/2015 1945   UROBILINOGEN 0.2 02/18/2015 1411   NITRITE NEGATIVE 12/11/2015 1945   LEUKOCYTESUR TRACE (A) 12/11/2015 1945   Sepsis Labs: @LABRCNTIP (procalcitonin:4,lacticidven:4) )No results found for this or any previous visit (from the past 240 hour(s)).   Radiological Exams on Admission: Dg Chest 2 View  Result Date: 12/11/2015 CLINICAL DATA:  70 y/o F; increasing weakness since having the flu and pneumonia vaccines. Fell last night cough. EXAM: CHEST  2 VIEW COMPARISON:  04/16/2015 chest radiograph FINDINGS: Stable cardiac silhouette within normal limits. Anterior fusion hardware noted. No acute osseous abnormality is evident. Streaky opacities in left lung base probably represent atelectasis or scarring. No focal consolidation. No pneumothorax or effusion. IMPRESSION: Streaky opacities in left lung base probably represent atelectasis or scarring. No focal consolidation. No pneumothorax or effusion. Electronically Signed   By: Kristine Garbe M.D.   On: 12/11/2015 20:51    EKG: Independently reviewed. Sinus tachycardia with ST-T changes.  Assessment/Plan Principal Problem:   CAP (community acquired pneumonia) Active Problems:   Seizure disorder as sequela of cerebrovascular accident [facility])   Generalized weakness   Diabetes mellitus type 2 in nonobese [facility])   Essential hypertension   Pneumonia    1. Pneumonia - patient has been placed on Levaquin for community-acquired pneumonia. Check urine for Legionella strep antigens sputum cultures. 2. Generalized weakness - probably from deconditioning. Will hold Lasix and spironolactone and gently hydrated. Get physical therapy consult. Check troponin. 3. Diastolic CHF last EF measured in March 2017 was 55-60% with grade 1 diastolic  dysfunction - since patient was feeling weak holding of diuretics. Closely follow respiratory status. 4. CAD - denies chest pain. Since patient is feeling weak checking cardiac markers. 5. Hypertension - on amlodipine and Imdur. 6. History of stroke with seizures - continue antiplatelet agents, and antiseizure medications and statins.  X-ray of the pelvis right shoulder and knees are pending.   DVT prophylaxis: Lovenox. Code Status: DO  NOT RESUSCITATE.  Family Communication: Patient's daughter.  Disposition Plan: To be determined.  Consults called: Physical therapy.  Admission status: Inpatient.    Rise Patience MD [facility] Pager [phone].  If 7PM-7AM, please contact night-coverage [email or web address] Password [facility]  12/12/2015, 6:36 AM

## 2015-12-12 NOTE — ED Notes (Signed)
Attempted report x1. 

## 2015-12-12 NOTE — ED Notes (Addendum)
Family member came out to nurse's station, stating that pt cannot be discharged home. Family member reported pt was "feeling worse than when she came in" and that family member would not be able to care for her at the level necessary.

## 2015-12-12 NOTE — Progress Notes (Signed)
PT Cancellation Note  Patient Details Name: Kathryn Lewis MRN: [id number] DOB: [birth year]   Cancelled Treatment:    Reason Eval/Treat Not Completed: Medical issues which prohibited therapy. Pt with elevated Troponin.  Per department protocol, will wait til it is trending downward.  Will check back as schedule permits.   Tracy Gerken LUBECK 12/12/2015, 12:42 PM

## 2015-12-12 NOTE — Progress Notes (Signed)
I called and spoke with [facility] regarding patient's Lamotrigine.  Most recently filled prescription was for 200mg  IR tabs with the sig to take 1.5 tabs BID. There was a prescription on file for the 300mg  XR tabs with a sig to take 1.5 tabs daily (XR tabs should not be split)- however, this was never picked up.  I tried to clarify with patient but she stated her daughter handled the medications, patient did state she only used [facility] for her prescriptions.  I changed prior to admission medication list to reflect what has been picked up from [place] and updated inpatient order.    Wilmer Santillo D. Preciosa Bundrick, PharmD, BCPS Clinical Pharmacist 12/12/2015 12:28 PM

## 2015-12-12 NOTE — Progress Notes (Signed)
Paged Dr. Hartford Poli, critical lab troponin 0.23, FYI

## 2015-12-12 NOTE — Discharge Instructions (Signed)
Use the inhaler 2 puffs every 3-4 hours with a spacer as needed for cough or trouble breathing   It is important to follow-up with your primary care doctor for a checkup, within one week.   Get plenty of rest, and drink a lot of fluids.

## 2015-12-13 DIAGNOSIS — I69398 Other sequelae of cerebral infarction: Secondary | ICD-10-CM

## 2015-12-13 DIAGNOSIS — R531 Weakness: Secondary | ICD-10-CM

## 2015-12-13 DIAGNOSIS — G40909 Epilepsy, unspecified, not intractable, without status epilepticus: Secondary | ICD-10-CM

## 2015-12-13 LAB — GLUCOSE, CAPILLARY
Glucose-Capillary: 157 mg/dL — ABNORMAL HIGH (ref 65–99)
Glucose-Capillary: 185 mg/dL — ABNORMAL HIGH (ref 65–99)

## 2015-12-13 LAB — LEGIONELLA PNEUMOPHILA SEROGP 1 UR AG: L. PNEUMOPHILA SEROGP 1 UR AG: NEGATIVE

## 2015-12-13 MED ORDER — LEVOFLOXACIN 750 MG PO TABS: 750.0000 mg | ORAL_TABLET | Freq: Every day | ORAL | Status: DC

## 2015-12-13 MED ORDER — LEVOFLOXACIN 750 MG PO TABS: 750.0000 mg | ORAL_TABLET | Freq: Every day | ORAL | 0 refills | Status: DC

## 2015-12-13 NOTE — Evaluation (Signed)
Physical Therapy Evaluation Patient Details Name: Kathryn Lewis MRN: [id number] DOB: 1945-05-16 Today's Date: 12/13/2015   History of Present Illness  Patient is a 70 y/o female with hx of CAD, PAD, stroke, MI, bipolar presents with weakness. On 11/15, patient fell but did not hit her head or LOC. CXR shows likely atelectasis or scarring in left lung base. XR of right shoulder, right knee, left knee, and pelvis are negative for acute abnormality.   Clinical Impression  Patient presents with deconditioning, generalized weakness, baseline memory deficits and impaired mobility/balance s/p above. Tolerated gait training with Min guard for safety. No overt LOB but concern for safety. Pt's son will be moving in with her at d/c to provide assist. Will follow acutely to maximize independence and mobility prior to return home. Encouraged use of RW at home until strength/balance improve.     Follow Up Recommendations No PT follow up;Supervision for mobility/OOB    Equipment Recommendations  None recommended by PT    Recommendations for Other Services OT consult     Precautions / Restrictions Precautions Precautions: Fall Restrictions Weight Bearing Restrictions: No      Mobility  Bed Mobility Overal bed mobility: Needs Assistance Bed Mobility: Supine to Sit     Supine to sit: Modified independent (Device/Increase time)     General bed mobility comments: No assist needed.   Transfers Overall transfer level: Needs assistance Equipment used: None Transfers: Sit to/from Stand Sit to Stand: Min guard         General transfer comment: Min guard for safety. Stood from [employer], from toilet x1.  Ambulation/Gait Ambulation/Gait assistance: Min guard Ambulation Distance (Feet): 150 Feet Assistive device: None Gait Pattern/deviations: Step-through pattern;Decreased stride length;Drifts right/left;Staggering right;Staggering left Gait velocity: decreased   General Gait Details: Slow,  mildly unsteady gait with staggering noted but no overt LOB. Fatigues.  Stairs            Wheelchair Mobility    Modified Rankin (Stroke Patients Only)       Balance Overall balance assessment: Needs assistance;History of Falls Sitting-balance support: Feet supported;No upper extremity supported Sitting balance-Leahy Scale: Good Sitting balance - Comments: Able to donn socks sitting EOB without difficulty or LOB.   Standing balance support: During functional activity Standing balance-Leahy Scale: Fair                               Pertinent Vitals/Pain Pain Assessment: No/denies pain    Home Living Family/patient expects to be discharged to:: Private residence Living Arrangements: Children (with son) Available Help at Discharge: Family;Available 24 hours/day Type of Home: Apartment Home Access: Level entry     Home Layout: One level Home Equipment: Walker - 2 wheels;Bedside commode      Prior Function Level of Independence: Independent         Comments: Independent for household ambulation and uses RW for community ambulation. Daughter used to live with her but now son will be there.     Hand Dominance        Extremity/Trunk Assessment   Upper Extremity Assessment: Defer to OT evaluation           Lower Extremity Assessment: Generalized weakness         Communication   Communication: No difficulties  Cognition Arousal/Alertness: Awake/alert Behavior During Therapy: WFL for tasks assessed/performed Overall Cognitive Status: Impaired/Different from baseline Area of Impairment: Memory;Orientation Orientation Level: Disoriented to;Time;Situation (Able to state she  was in the hospital but does not know date, or that thanksgiving is coming up.)   Memory: Decreased short-term memory         General Comments: Reports memory issues from prior CVA.    General Comments General comments (skin integrity, edema, etc.): Son present  during session and assisted providing PLOF/history.    Exercises     Assessment/Plan    PT Assessment Patient needs continued PT services  PT Problem List Decreased strength;Decreased mobility;Decreased safety awareness;Decreased balance;Decreased cognition;Decreased activity tolerance          PT Treatment Interventions Therapeutic activities;Gait training;Therapeutic exercise;Patient/family education;Functional mobility training;Balance training    PT Goals (Current goals can be found in the Care Plan section)  Acute Rehab PT Goals Patient Stated Goal: to go home PT Goal Formulation: With patient Time For Goal Achievement: 12/27/15 Potential to Achieve Goals: Good    Frequency Min 3X/week   Barriers to discharge        Co-evaluation               End of Session Equipment Utilized During Treatment: Gait belt Activity Tolerance: Patient tolerated treatment well Patient left: in bed;with call bell/phone within reach;with family/visitor present (Sitting EOB.) Nurse Communication: Mobility status         Time: 1145-1200 PT Time Calculation (min) (ACUTE ONLY): 15 min   Charges:   PT Evaluation $PT Eval Low Complexity: 1 Procedure     PT G Codes:        Britta Louth A Zamani Crocker 12/13/2015, 12:16 PM Wray Kearns, [place], DPT [phone]

## 2015-12-13 NOTE — Progress Notes (Signed)
Patient discharged home per MD. Casemanager consult complete. Home health to follow patient at home. Reviewed discharge instructions with daughter. Daughter had already picked up prescription. Patient escorted out via wheelchair by NT. Bartholomew Crews, RN

## 2015-12-13 NOTE — Discharge Summary (Signed)
Physician Discharge Summary  Kathryn Lewis [id number] DOB: 1945-12-20 DOA: 12/11/2015  PCP: Kathryn Mariscal, MD  Admit date: 12/11/2015 Discharge date: 12/13/2015  Admitted From: Home Disposition: Home  Recommendations for Outpatient Follow-up:  1. Follow up with PCP in 1-2 weeks 2. Please obtain BMP/CBC in one week  Home Health: NA Equipment/Devices:NA  Discharge Condition: Stable CODE STATUS: DNR Diet recommendation: Diet heart healthy/carb modified Room service appropriate? Yes; Fluid consistency: Thin Diet - low sodium heart healthy  Brief/Interim Summary: Kathryn Harveyis a 70 y.o.femalewith history of stroke, seizures, CAD, CHF, diabetes mellitus was brought to the ER 11/16 after patient was feeling weak. Patient's daughter provided the history as patient has memory issues from previous stroke, stated that patient 2 weeks ago was diagnosed with bronchitis and was on Z-Pak. Patient felt weak after receiving flu shot 11/14. On 11/15 evening patient fell but did not hit her head or lose consciousness. Since then patient has been getting progressively weak. In the ER patient is found to be tachycardic. Chest x-ray shows likely atelectasis or scarring in left lung base. XR of right shoulder, right knee, left knee, and pelvis are negative for acute abnormality.   Discharge Diagnoses:  Principal Problem:   Community acquired pneumonia Active Problems:   Seizure disorder as sequela of cerebrovascular accident [name]   Generalized weakness   Diabetes mellitus type 2 in nonobese [facility])   Essential hypertension   Pneumonia   Acute bronchitis   Possible pneumonia - Patient was placed on Levaquin for community-acquired pneumonia.  - Chest x-ray shows likely atelectasis or scarring in left lung base. - WBC 15.0 11/16 and 12.1 today - Cultures for Legionella strep antigens pending. - Discharged home on 4 more days of levofloxacin.  Generalized weaknessand fall - Possibly from  deconditioning, no loss of consciousness or syncope  - XR of right shoulder, right knee, left knee, and pelvis are negative for acute abnormality.  - Will hold Lasix and spironolactone and gently hydrated. Get physical therapy consult. - Troponin 0.23 on 11/17 - Troponin was slightly elevated at 0.2, 0.05 and 0.03, patient denies any chest pain. - She is on aspirin/Plavix and beta blockers these were continued, troponin elevation less likely to be ACS.  Diastolic CHF - last EF measured in March 2017 was 55-60% with grade 1 diastolic dysfunction  - Diuretics have been held due to weakness - Denies shortness of breath  CAD  - Denies chest pain, except with inspiration - Troponin 0.23 on 11/17  Hypertension - on amlodipine and Imdur.  History of stroke with seizures  - Continue antiplatelet agents, and antiseizure medications and statins. - Phenytoin level 6.8   Discharge Instructions  Discharge Instructions    Diet - low sodium heart healthy    Complete by:  As directed    Increase activity slowly    Complete by:  As directed        Medication List    TAKE these medications   amLODipine 10 MG tablet Commonly known as:  NORVASC Take 1 tablet (10 mg total) by mouth daily.   aspirin 81 MG chewable tablet Chew 81 mg by mouth daily.   atorvastatin 80 MG tablet Commonly known as:  LIPITOR Take 80 mg by mouth daily.   clopidogrel 75 MG tablet Commonly known as:  PLAVIX Take 75 mg by mouth daily.   ferrous sulfate 325 (65 FE) MG tablet Take 325 mg by mouth daily.   furosemide 40 MG tablet Commonly known as:  LASIX  Take 40 mg by mouth See admin instructions. Take on Tuesday and thursday   gabapentin 100 MG capsule Commonly known as:  NEURONTIN Take 100 mg by mouth 3 (three) times daily.   isosorbide mononitrate 30 MG 24 hr tablet Commonly known as:  IMDUR Take 30 mg by mouth every morning.   lamoTRIgine 200 MG tablet Commonly known as:  LAMICTAL Take 300  mg by mouth 2 (two) times daily.   levofloxacin 750 MG tablet Commonly known as:  LEVAQUIN Take 1 tablet (750 mg total) by mouth daily. Start taking on:  12/14/2015   metoprolol tartrate 25 MG tablet Commonly known as:  LOPRESSOR Take 25 mg by mouth 2 (two) times daily.   nitroGLYCERIN 0.4 MG SL tablet Commonly known as:  NITROSTAT Place 0.4 mg under the tongue every 5 (five) minutes as needed for chest pain.   phenytoin 100 MG ER capsule Commonly known as:  DILANTIN Take 300 mg by mouth at bedtime.   phenytoin 30 MG ER capsule Commonly known as:  DILANTIN Take 30 mg by mouth at bedtime.   potassium chloride 10 MEQ tablet Commonly known as:  K-DUR,KLOR-CON Take 10 mEq by mouth daily. Take on Tuesday and thursday   spironolactone 25 MG tablet Commonly known as:  ALDACTONE Take 1 tablet (25 mg total) by mouth daily.   Vitamin D 2000 units Caps Take 2,000 capsules by mouth 3 (three) times daily.       Allergies  Allergen Reactions  . Amoxicillin Other (See Comments)    colitis  . Augmentin [Amoxicillin-Pot Clavulanate]     colitis  . Cephalexin Other (See Comments)    colitis  . Erythromycin Other (See Comments)    colitis  . Niacin And Related Other (See Comments)    colitis  . Other     Consultations:  None   Procedures (Echo, Carotid, EGD, Colonoscopy, ERCP)   Radiological studies: Dg Chest 2 View  Result Date: 12/11/2015 CLINICAL DATA:  70 y/o F; increasing weakness since having the flu and pneumonia vaccines. Fell last night cough. EXAM: CHEST  2 VIEW COMPARISON:  04/16/2015 chest radiograph FINDINGS: Stable cardiac silhouette within normal limits. Anterior fusion hardware noted. No acute osseous abnormality is evident. Streaky opacities in left lung base probably represent atelectasis or scarring. No focal consolidation. No pneumothorax or effusion. IMPRESSION: Streaky opacities in left lung base probably represent atelectasis or scarring. No focal  consolidation. No pneumothorax or effusion. Electronically Signed   By: Kristine Garbe M.D.   On: 12/11/2015 20:51   Dg Pelvis 1-2 Views  Result Date: 12/12/2015 CLINICAL DATA:  Status post slip and fall 12/10/2015. Pain and increasing weakness. Initial encounter. EXAM: PELVIS - 1-2 VIEW COMPARISON:  None. FINDINGS: There is no evidence of pelvic fracture or diastasis. No pelvic bone lesions are seen. Atherosclerotic vascular disease is noted. IMPRESSION: No acute abnormality. Atherosclerosis. Electronically Signed   By: Inge Rise M.D.   On: 12/12/2015 10:20   Dg Shoulder Right  Result Date: 12/12/2015 CLINICAL DATA:  Fall 2 days ago right shoulder pain. EXAM: RIGHT SHOULDER - 2+ VIEW COMPARISON:  None. FINDINGS: There is no evidence of fracture or dislocation. There is no evidence of arthropathy or other focal bone abnormality. Soft tissues are unremarkable. IMPRESSION: Negative. Electronically Signed   By: Monte Fantasia M.D.   On: 12/12/2015 10:19   Dg Knee 1-2 Views Left  Result Date: 12/12/2015 CLINICAL DATA:  Status post fall 12/10/2015. Left knee pain and increasing weakness.  Initial encounter. EXAM: LEFT KNEE - 1-2 VIEW COMPARISON:  None. FINDINGS: No evidence of fracture, dislocation, or joint effusion. No evidence of arthropathy or other focal bone abnormality. Atherosclerotic vascular disease is noted. IMPRESSION: Negative exam. Electronically Signed   By: Inge Rise M.D.   On: 12/12/2015 10:22   Dg Knee 1-2 Views Right  Result Date: 12/12/2015 CLINICAL DATA:  Status post fall 12/10/2015. Right knee pain increasing weakness. EXAM: RIGHT KNEE - 1-2 VIEW COMPARISON:  None. FINDINGS: No evidence of fracture, dislocation, or joint effusion. No evidence of arthropathy or other focal bone abnormality. Atherosclerotic vascular disease is noted. IMPRESSION: Negative exam. Electronically Signed   By: Inge Rise M.D.   On: 12/12/2015 10:21      Subjective:  Discharge Exam: Vitals:   12/12/15 2103 12/13/15 0511 12/13/15 0945 12/13/15 1118  BP: (!) 153/55 135/67 (!) 146/62 (!) 154/85  Pulse: 87 79 73 80  Resp: 18 16 18    Temp: 98.9 F (37.2 C) 98.8 F (37.1 C) 98.1 F (36.7 C)   TempSrc: Oral Oral Oral   SpO2: 97% 99% 98%   Weight: 61.9 kg (136 lb 7.4 oz)     Height:       General: Pt is alert, awake, not in acute distress Cardiovascular: RRR, S1/S2 +, no rubs, no gallops Respiratory: CTA bilaterally, no wheezing, no rhonchi Abdominal: Soft, NT, ND, bowel sounds + Extremities: no edema, no cyanosis   The results of significant diagnostics from this hospitalization (including imaging, microbiology, ancillary and laboratory) are listed below for reference.    Microbiology: Recent Results (from the past 240 hour(s))  Blood culture (routine x 2)     Status: None (Preliminary result)   Collection Time: 12/12/15  2:12 AM  Result Value Ref Range Status   Specimen Description BLOOD LEFT ARM  Final   Special Requests BOTTLES DRAWN AEROBIC AND ANAEROBIC 5ML  Final   Culture NO GROWTH 1 DAY  Final   Report Status PENDING  Incomplete  Blood culture (routine x 2)     Status: None (Preliminary result)   Collection Time: 12/12/15  2:30 AM  Result Value Ref Range Status   Specimen Description BLOOD LEFT FOREARM  Final   Special Requests BOTTLES DRAWN AEROBIC AND ANAEROBIC 5ML  Final   Culture NO GROWTH 1 DAY  Final   Report Status PENDING  Incomplete     Labs: BNP (last 3 results) No results for input(s): BNP in the last 8760 hours. Basic Metabolic Panel:  Recent Labs Lab 12/11/15 1816 12/12/15 0648  NA 131* 136  K 4.1 3.6  CL 97* 106  CO2 23 20*  GLUCOSE 247* 156*  BUN 12 9  CREATININE 0.88 0.68  CALCIUM 9.3 8.8*   Liver Function Tests:  Recent Labs Lab 12/12/15 0648  AST 25  ALT 19  ALKPHOS 83  BILITOT 0.4  PROT 5.8*  ALBUMIN 3.2*   No results for input(s): LIPASE, AMYLASE in the last 168  hours. No results for input(s): AMMONIA in the last 168 hours. CBC:  Recent Labs Lab 12/11/15 1816 12/12/15 0648  WBC 15.0* 12.1*  NEUTROABS  --  9.6*  HGB 12.2 11.0*  HCT 36.7 33.4*  MCV 84.0 83.9  PLT 198 167   Cardiac Enzymes:  Recent Labs Lab 12/12/15 0648 12/12/15 1226 12/12/15 1801  TROPONINI 0.23* 0.05* 0.03*   BNP: Invalid input(s): POCBNP CBG:  Recent Labs Lab 12/12/15 2114 12/13/15 0749 12/13/15 1127  GLUCAP 208* 157* 185*  D-Dimer No results for input(s): DDIMER in the last 72 hours. Hgb A1c No results for input(s): HGBA1C in the last 72 hours. Lipid Profile No results for input(s): CHOL, HDL, LDLCALC, TRIG, CHOLHDL, LDLDIRECT in the last 72 hours. Thyroid function studies No results for input(s): TSH, T4TOTAL, T3FREE, THYROIDAB in the last 72 hours.  Invalid input(s): FREET3 Anemia work up No results for input(s): VITAMINB12, FOLATE, FERRITIN, TIBC, IRON, RETICCTPCT in the last 72 hours. Urinalysis    Component Value Date/Time   COLORURINE YELLOW 12/11/2015 1945   APPEARANCEUR CLOUDY (A) 12/11/2015 1945   LABSPEC 1.012 12/11/2015 1945   PHURINE 6.0 12/11/2015 1945   GLUCOSEU NEGATIVE 12/11/2015 1945   HGBUR NEGATIVE 12/11/2015 1945   BILIRUBINUR NEGATIVE 12/11/2015 1945   KETONESUR NEGATIVE 12/11/2015 1945   PROTEINUR 30 (A) 12/11/2015 1945   UROBILINOGEN 0.2 02/18/2015 1411   NITRITE NEGATIVE 12/11/2015 1945   LEUKOCYTESUR TRACE (A) 12/11/2015 1945   Sepsis Labs Invalid input(s): PROCALCITONIN,  WBC,  LACTICIDVEN Microbiology Recent Results (from the past 240 hour(s))  Blood culture (routine x 2)     Status: None (Preliminary result)   Collection Time: 12/12/15  2:12 AM  Result Value Ref Range Status   Specimen Description BLOOD LEFT ARM  Final   Special Requests BOTTLES DRAWN AEROBIC AND ANAEROBIC 5ML  Final   Culture NO GROWTH 1 DAY  Final   Report Status PENDING  Incomplete  Blood culture (routine x 2)     Status: None  (Preliminary result)   Collection Time: 12/12/15  2:30 AM  Result Value Ref Range Status   Specimen Description BLOOD LEFT FOREARM  Final   Special Requests BOTTLES DRAWN AEROBIC AND ANAEROBIC 5ML  Final   Culture NO GROWTH 1 DAY  Final   Report Status PENDING  Incomplete     Time coordinating discharge: Over 30 minutes  SIGNED:   Birdie Hopes, MD  [facility] 12/13/2015, 11:44 AM Pager   If 7PM-7AM, please contact night-coverage [email or web address] Password [email or web address]

## 2015-12-17 LAB — CULTURE, BLOOD (ROUTINE X 2)
CULTURE: NO GROWTH
CULTURE: NO GROWTH

## 2016-02-09 DIAGNOSIS — S060X0A Concussion without loss of consciousness, initial encounter: Secondary | ICD-10-CM | POA: Diagnosis not present

## 2016-02-09 DIAGNOSIS — I10 Essential (primary) hypertension: Secondary | ICD-10-CM | POA: Diagnosis not present

## 2016-02-09 DIAGNOSIS — E559 Vitamin D deficiency, unspecified: Secondary | ICD-10-CM | POA: Diagnosis not present

## 2016-02-09 DIAGNOSIS — E119 Type 2 diabetes mellitus without complications: Secondary | ICD-10-CM | POA: Diagnosis not present

## 2016-02-26 DIAGNOSIS — I10 Essential (primary) hypertension: Secondary | ICD-10-CM | POA: Diagnosis not present

## 2016-02-26 DIAGNOSIS — J029 Acute pharyngitis, unspecified: Secondary | ICD-10-CM | POA: Diagnosis not present

## 2016-02-26 DIAGNOSIS — F329 Major depressive disorder, single episode, unspecified: Secondary | ICD-10-CM | POA: Diagnosis not present

## 2016-02-26 DIAGNOSIS — I509 Heart failure, unspecified: Secondary | ICD-10-CM | POA: Diagnosis not present

## 2016-03-30 DIAGNOSIS — R0602 Shortness of breath: Secondary | ICD-10-CM | POA: Diagnosis not present

## 2016-03-30 DIAGNOSIS — I509 Heart failure, unspecified: Secondary | ICD-10-CM | POA: Diagnosis not present

## 2016-03-30 DIAGNOSIS — J449 Chronic obstructive pulmonary disease, unspecified: Secondary | ICD-10-CM | POA: Diagnosis not present

## 2016-03-30 DIAGNOSIS — G4731 Primary central sleep apnea: Secondary | ICD-10-CM | POA: Diagnosis not present

## 2016-04-04 DIAGNOSIS — G4733 Obstructive sleep apnea (adult) (pediatric): Secondary | ICD-10-CM | POA: Diagnosis not present

## 2016-04-07 DIAGNOSIS — J449 Chronic obstructive pulmonary disease, unspecified: Secondary | ICD-10-CM | POA: Diagnosis not present

## 2016-04-07 DIAGNOSIS — I2510 Atherosclerotic heart disease of native coronary artery without angina pectoris: Secondary | ICD-10-CM | POA: Diagnosis not present

## 2016-04-07 DIAGNOSIS — F17200 Nicotine dependence, unspecified, uncomplicated: Secondary | ICD-10-CM | POA: Diagnosis not present

## 2016-04-07 DIAGNOSIS — Z87891 Personal history of nicotine dependence: Secondary | ICD-10-CM | POA: Diagnosis not present

## 2016-04-07 DIAGNOSIS — I509 Heart failure, unspecified: Secondary | ICD-10-CM | POA: Diagnosis not present

## 2016-04-07 DIAGNOSIS — R0602 Shortness of breath: Secondary | ICD-10-CM | POA: Diagnosis not present

## 2016-04-07 LAB — PULMONARY FUNCTION TEST

## 2016-04-16 DIAGNOSIS — Z122 Encounter for screening for malignant neoplasm of respiratory organs: Secondary | ICD-10-CM | POA: Diagnosis not present

## 2016-04-16 DIAGNOSIS — Z87891 Personal history of nicotine dependence: Secondary | ICD-10-CM | POA: Diagnosis not present

## 2016-04-21 DIAGNOSIS — I10 Essential (primary) hypertension: Secondary | ICD-10-CM | POA: Diagnosis not present

## 2016-04-21 DIAGNOSIS — G4730 Sleep apnea, unspecified: Secondary | ICD-10-CM | POA: Diagnosis not present

## 2016-04-21 DIAGNOSIS — F17200 Nicotine dependence, unspecified, uncomplicated: Secondary | ICD-10-CM | POA: Diagnosis not present

## 2016-04-21 DIAGNOSIS — Z87891 Personal history of nicotine dependence: Secondary | ICD-10-CM | POA: Diagnosis not present

## 2016-04-26 DIAGNOSIS — I509 Heart failure, unspecified: Secondary | ICD-10-CM | POA: Diagnosis not present

## 2016-04-26 DIAGNOSIS — G4733 Obstructive sleep apnea (adult) (pediatric): Secondary | ICD-10-CM | POA: Diagnosis not present

## 2016-04-26 DIAGNOSIS — M545 Low back pain: Secondary | ICD-10-CM | POA: Diagnosis not present

## 2016-04-26 DIAGNOSIS — I10 Essential (primary) hypertension: Secondary | ICD-10-CM | POA: Diagnosis not present

## 2016-04-29 ENCOUNTER — Emergency Department (HOSPITAL_COMMUNITY): Payer: Medicare Other

## 2016-04-29 ENCOUNTER — Encounter (HOSPITAL_COMMUNITY): Payer: Self-pay

## 2016-04-29 ENCOUNTER — Emergency Department (HOSPITAL_COMMUNITY)
Admission: EM | Admit: 2016-04-29 | Discharge: 2016-04-29 | Disposition: A | Discharge status: Home / Self Care | Payer: Medicare Other | Attending: Emergency Medicine | Admitting: Emergency Medicine

## 2016-04-29 DIAGNOSIS — I10 Essential (primary) hypertension: Secondary | ICD-10-CM | POA: Insufficient documentation

## 2016-04-29 DIAGNOSIS — Y929 Unspecified place or not applicable: Secondary | ICD-10-CM | POA: Diagnosis not present

## 2016-04-29 DIAGNOSIS — Y999 Unspecified external cause status: Secondary | ICD-10-CM | POA: Insufficient documentation

## 2016-04-29 DIAGNOSIS — Z7982 Long term (current) use of aspirin: Secondary | ICD-10-CM | POA: Insufficient documentation

## 2016-04-29 DIAGNOSIS — S0990XA Unspecified injury of head, initial encounter: Secondary | ICD-10-CM | POA: Insufficient documentation

## 2016-04-29 DIAGNOSIS — W0110XA Fall on same level from slipping, tripping and stumbling with subsequent striking against unspecified object, initial encounter: Secondary | ICD-10-CM | POA: Insufficient documentation

## 2016-04-29 DIAGNOSIS — I2510 Atherosclerotic heart disease of native coronary artery without angina pectoris: Secondary | ICD-10-CM | POA: Diagnosis not present

## 2016-04-29 DIAGNOSIS — G4489 Other headache syndrome: Secondary | ICD-10-CM | POA: Diagnosis not present

## 2016-04-29 DIAGNOSIS — E119 Type 2 diabetes mellitus without complications: Secondary | ICD-10-CM | POA: Insufficient documentation

## 2016-04-29 DIAGNOSIS — W19XXXA Unspecified fall, initial encounter: Secondary | ICD-10-CM

## 2016-04-29 DIAGNOSIS — Y939 Activity, unspecified: Secondary | ICD-10-CM | POA: Insufficient documentation

## 2016-04-29 DIAGNOSIS — S199XXA Unspecified injury of neck, initial encounter: Secondary | ICD-10-CM | POA: Diagnosis not present

## 2016-04-29 NOTE — ED Provider Notes (Signed)
[place] DEPT Provider Note   CSN: [id number] Arrival date & time: 04/29/16  1604     History   Chief Complaint Chief Complaint  Patient presents with  . Fall    HPI Kathryn Lewis is a 71 y.o. female.  HPI Patient presents to the emergency department with injuries following a fall.  The patient states she was using her walker when she tripped and fell backwards hitting the back.  The patient states she does not ablation loss consciousness.  Patient states that she did not take any medications prior to arrival.  States that she does not have any hip pain or extremity pain. The patient denies chest pain, shortness of breath, blurred vision, neck pain, fever, cough, weakness, numbness, dizziness, anorexia, edema, abdominal pain, nausea, vomiting, diarrhea, rash, back pain, dysuria, hematemesis, bloody stool, near syncope, or syncope. Past Medical History:  Diagnosis Date  . Bipolar 1 disorder ([place])   . Carotid artery stenosis    50-69% bilateral ICA stenoses by velocity criteria but no visible plaque and ICA/CCA ratio 1.72 on right and 1.38 on left  . Coronary artery disease   . Hypertension   . Left adrenal mass ([facility])    2.5 cm L adrenal mass on CT per note of Dr. Seleta Rhymes 05/20/2014  . MI, old   . PAD (peripheral artery disease) ([place])    03/2014 - CT aortogram with lower extremity runoff (mild to moderate disease in common iliac arteries, complete occulusion of her bilateral external iliac arteries with heavily disease common femoral arteries. She also has disease to her SFAs bilaterally. Popliteal arteries and tibial vessel runoff appears to be adequate)  . Stroke [facility])     Patient Active Problem List   Diagnosis Date Noted  . Community acquired pneumonia 12/12/2015  . Generalized weakness 12/12/2015  . Diabetes mellitus type 2 in nonobese ([place]) 12/12/2015  . Essential hypertension 12/12/2015  . Pneumonia 12/12/2015  . Acute bronchitis   . Hypertensive urgency 04/17/2015  .  Seizure disorder as sequela of cerebrovascular accident ([place]) 04/17/2015  . CAD (coronary artery disease) 04/17/2015    Past Surgical History:  Procedure Laterality Date  . BREAST SURGERY    . NECK SURGERY      OB History    No data available       Home Medications    Prior to Admission medications   Medication Sig Start Date End Date Taking? Authorizing Provider  amLODipine (NORVASC) 10 MG tablet Take 1 tablet (10 mg total) by mouth daily. 04/18/15  Yes Reyne Dumas, MD  aspirin 81 MG chewable tablet Chew 81 mg by mouth daily.   Yes Historical Provider, MD  atorvastatin (LIPITOR) 80 MG tablet Take 80 mg by mouth daily.   Yes Historical Provider, MD  Cholecalciferol (VITAMIN D) 2000 units CAPS Take 2,000 capsules by mouth daily.    Yes Historical Provider, MD  clopidogrel (PLAVIX) 75 MG tablet Take 75 mg by mouth daily.   Yes Historical Provider, MD  ferrous sulfate 325 (65 FE) MG tablet Take 325 mg by mouth daily.    Yes Historical Provider, MD  furosemide (LASIX) 40 MG tablet Take 40 mg by mouth daily as needed for edema.    Yes Historical Provider, MD  gabapentin (NEURONTIN) 100 MG capsule Take 100 mg by mouth 2 (two) times daily.  05/20/14  Yes Historical Provider, MD  isosorbide mononitrate (IMDUR) 30 MG 24 hr tablet Take 30 mg by mouth every morning.   Yes Historical Provider,  MD  lamoTRIgine (LAMICTAL) 200 MG tablet Take 300 mg by mouth 2 (two) times daily.   Yes Historical Provider, MD  metoprolol tartrate (LOPRESSOR) 25 MG tablet Take 25 mg by mouth 2 (two) times daily.   Yes Historical Provider, MD  naproxen (NAPROSYN) 500 MG tablet Take 500 mg by mouth 2 (two) times daily as needed for moderate pain.   Yes Historical Provider, MD  nitroGLYCERIN (NITROSTAT) 0.4 MG SL tablet Place 0.4 mg under the tongue every 5 (five) minutes as needed for chest pain.   Yes Historical Provider, MD  phenytoin (DILANTIN) 100 MG ER capsule Take 300 mg by mouth at bedtime.   Yes Historical  Provider, MD  phenytoin (DILANTIN) 30 MG ER capsule Take 30 mg by mouth at bedtime.   Yes Historical Provider, MD  potassium chloride (K-DUR,KLOR-CON) 10 MEQ tablet Take 10 mEq by mouth daily. Take on Tuesday and thursday   Yes Historical Provider, MD  spironolactone (ALDACTONE) 25 MG tablet Take 1 tablet (25 mg total) by mouth daily. 04/18/15  Yes Reyne Dumas, MD    Family History Family History  Problem Relation Age of Onset  . Hypertension Mother   . Hypertension Father     Social History Social History  Substance Use Topics  . Smoking status: Never Smoker  . Smokeless tobacco: Never Used  . Alcohol use No     Allergies   Amoxicillin; Augmentin [amoxicillin-pot clavulanate]; Cephalexin; Erythromycin; Niacin and related; and Other   Review of Systems Review of Systems  All other systems negative except as documented in the HPI. All pertinent positives and negatives as reviewed in the HPI. Physical Exam Updated Vital Signs BP (!) 151/75   Pulse 88   Resp 14   Ht 5\' 3"  (1.6 m)   Wt 68 kg   SpO2 98%   BMI 26.57 kg/m   Physical Exam  Constitutional: She is oriented to person, place, and time. She appears well-developed and well-nourished. No distress.  HENT:  Head: Normocephalic and atraumatic.  Mouth/Throat: Oropharynx is clear and moist.  Eyes: Pupils are equal, round, and reactive to light.  Neck: Normal range of motion. Neck supple.  Cardiovascular: Normal rate, regular rhythm and normal heart sounds.  Exam reveals no gallop and no friction rub.   No murmur heard. Pulmonary/Chest: Effort normal and breath sounds normal. No respiratory distress. She has no wheezes.  Abdominal: Soft. Bowel sounds are normal. She exhibits no distension. There is no tenderness.  Musculoskeletal:       Right hip: Normal.       Left hip: Normal.       Cervical back: Normal.       Thoracic back: Normal.       Lumbar back: Normal.  Neurological: She is alert and oriented to person,  place, and time. She exhibits normal muscle tone. Coordination normal.  Skin: Skin is warm and dry. Capillary refill takes less than 2 seconds. No rash noted. No erythema.  Psychiatric: She has a normal mood and affect. Her behavior is normal.  Nursing note and vitals reviewed.    ED Treatments / Results  Labs (all labs ordered are listed, but only abnormal results are displayed) Labs Reviewed - No data to display  EKG  EKG Interpretation None       Radiology Ct Head Wo Contrast  Result Date: 04/29/2016 CLINICAL DATA:  [place] today striking back of head, history hypertension, stroke, coronary artery disease post MI EXAM: CT HEAD WITHOUT CONTRAST  CT CERVICAL SPINE WITHOUT CONTRAST TECHNIQUE: Multidetector CT imaging of the head and cervical spine was performed following the standard protocol without intravenous contrast. Multiplanar CT image reconstructions of the cervical spine were also generated. COMPARISON:  CT head 06/07/2014 FINDINGS: CT HEAD FINDINGS Brain: Generalized atrophy. Stable ventricular morphology with ex vacuo dilatation of the atrium and occipital horn of the RIGHT lateral ventricle. Large old RIGHT occipital and smaller LEFT temporal infarcts. Small vessel chronic ischemic changes of deep cerebral white matter. No midline shift or mass effect. No definite intracranial hemorrhage, mass lesion, or evidence acute infarction. No extra-axial fluid collections. Vascular: Atherosclerotic calcification of internal carotid and vertebral arteries at skullbase Skull: Lucent focus at the posterior RIGHT parietal bone unchanged question related to arachnoid granulation. Sinuses/Orbits: Intact Other: N/A CT CERVICAL SPINE FINDINGS Alignment: Normal Skull base and vertebrae: Visualized skullbase intact. Prior anterior fusion C5-C6. Vertebral body heights maintained. Multilevel facet degenerative changes. No fracture or bone destruction. Significant multilevel neural foraminal narrowing  bilaterally by combinations of facet and uncovertebral hypertrophy, especially RIGHT C3-C4, C4-C5 and C5-C6, LEFT C4-C5. Soft tissues and spinal canal: Prevertebral soft tissues normal thickness. Atherosclerotic calcifications at the carotid arteries, which extend retropharyngeal bilaterally. Additional atherosclerotic calcifications at the aortic arch and proximal great vessels. Disc levels: Multilevel disc space narrowing and minimal endplate spur formation. Upper chest: Lung apices clear Other: N/A IMPRESSION: Generalized atrophy with small vessel chronic ischemic changes of deep cerebral white matter. Old RIGHT occipital and smaller RIGHT temporal lobe infarcts. No acute intracranial abnormalities. Multilevel degenerative disc and facet disease changes of the cervical spine with multilevel neural foraminal stenoses as above. Prior anterior fusion C5-C6. No acute cervical spine abnormalities. Extensive atherosclerotic calcifications in the neck, upper chest, and at skullbase. Electronically Signed   By: Lavonia Dana M.D.   On: 04/29/2016 17:57   Ct Cervical Spine Wo Contrast  Result Date: 04/29/2016 CLINICAL DATA:  [place] today striking back of head, history hypertension, stroke, coronary artery disease post MI EXAM: CT HEAD WITHOUT CONTRAST CT CERVICAL SPINE WITHOUT CONTRAST TECHNIQUE: Multidetector CT imaging of the head and cervical spine was performed following the standard protocol without intravenous contrast. Multiplanar CT image reconstructions of the cervical spine were also generated. COMPARISON:  CT head 06/07/2014 FINDINGS: CT HEAD FINDINGS Brain: Generalized atrophy. Stable ventricular morphology with ex vacuo dilatation of the atrium and occipital horn of the RIGHT lateral ventricle. Large old RIGHT occipital and smaller LEFT temporal infarcts. Small vessel chronic ischemic changes of deep cerebral white matter. No midline shift or mass effect. No definite intracranial hemorrhage, mass lesion, or  evidence acute infarction. No extra-axial fluid collections. Vascular: Atherosclerotic calcification of internal carotid and vertebral arteries at skullbase Skull: Lucent focus at the posterior RIGHT parietal bone unchanged question related to arachnoid granulation. Sinuses/Orbits: Intact Other: N/A CT CERVICAL SPINE FINDINGS Alignment: Normal Skull base and vertebrae: Visualized skullbase intact. Prior anterior fusion C5-C6. Vertebral body heights maintained. Multilevel facet degenerative changes. No fracture or bone destruction. Significant multilevel neural foraminal narrowing bilaterally by combinations of facet and uncovertebral hypertrophy, especially RIGHT C3-C4, C4-C5 and C5-C6, LEFT C4-C5. Soft tissues and spinal canal: Prevertebral soft tissues normal thickness. Atherosclerotic calcifications at the carotid arteries, which extend retropharyngeal bilaterally. Additional atherosclerotic calcifications at the aortic arch and proximal great vessels. Disc levels: Multilevel disc space narrowing and minimal endplate spur formation. Upper chest: Lung apices clear Other: N/A IMPRESSION: Generalized atrophy with small vessel chronic ischemic changes of deep cerebral white matter. Old RIGHT occipital and  smaller RIGHT temporal lobe infarcts. No acute intracranial abnormalities. Multilevel degenerative disc and facet disease changes of the cervical spine with multilevel neural foraminal stenoses as above. Prior anterior fusion C5-C6. No acute cervical spine abnormalities. Extensive atherosclerotic calcifications in the neck, upper chest, and at skullbase. Electronically Signed   By: Lavonia Dana M.D.   On: 04/29/2016 17:57    Procedures Procedures (including critical care time)  Medications Ordered in ED Medications - No data to display   Initial Impression / Assessment and Plan / ED Course  I have reviewed the triage vital signs and the nursing notes.  Pertinent labs & imaging results that were available  during my care of the patient were reviewed by me and considered in my medical decision making (see chart for details).     Patient has negative CT scans.  Patient is advised follow-up with her primary care Dr. told to return here as needed.  Tylenol and Motrin for any pain  Final Clinical Impressions(s) / ED Diagnoses   Final diagnoses:  None    New Prescriptions New Prescriptions   No medications on file     Dalia Heading, PA-C 04/29/16 [place], MD 05/01/16 [phone]

## 2016-04-29 NOTE — ED Notes (Signed)
Pts daughter contacted to inform her that DNR was left here. Pts daughter will return to get DNR. RN will leave at pod A nurses station with note on it.

## 2016-04-29 NOTE — ED Triage Notes (Signed)
Pt brought in by EMS due to having fall. Pt is from [employer] and fell and  hit back on head. Pt is a&ox4. Pt denies LOC, neck, or back pain. Pt takes plavix and aspirin.

## 2016-04-29 NOTE — Discharge Instructions (Signed)
Return here as needed.  Your CT scan did not show any abnormalities.  Follow-up with yourr primary care doctor

## 2016-05-13 DIAGNOSIS — I10 Essential (primary) hypertension: Secondary | ICD-10-CM | POA: Diagnosis not present

## 2016-05-13 DIAGNOSIS — R42 Dizziness and giddiness: Secondary | ICD-10-CM | POA: Diagnosis not present

## 2016-05-13 DIAGNOSIS — E559 Vitamin D deficiency, unspecified: Secondary | ICD-10-CM | POA: Diagnosis not present

## 2016-05-13 DIAGNOSIS — M79605 Pain in left leg: Secondary | ICD-10-CM | POA: Diagnosis not present

## 2016-05-13 DIAGNOSIS — E119 Type 2 diabetes mellitus without complications: Secondary | ICD-10-CM | POA: Diagnosis not present

## 2016-05-13 DIAGNOSIS — J309 Allergic rhinitis, unspecified: Secondary | ICD-10-CM | POA: Diagnosis not present

## 2016-05-13 DIAGNOSIS — M79604 Pain in right leg: Secondary | ICD-10-CM | POA: Diagnosis not present

## 2016-05-13 DIAGNOSIS — E7800 Pure hypercholesterolemia, unspecified: Secondary | ICD-10-CM | POA: Diagnosis not present

## 2016-06-15 DIAGNOSIS — E119 Type 2 diabetes mellitus without complications: Secondary | ICD-10-CM | POA: Diagnosis not present

## 2016-06-15 DIAGNOSIS — L03211 Cellulitis of face: Secondary | ICD-10-CM | POA: Diagnosis not present

## 2016-06-16 ENCOUNTER — Emergency Department (HOSPITAL_COMMUNITY): Payer: Medicare Other

## 2016-06-16 ENCOUNTER — Emergency Department (HOSPITAL_COMMUNITY)
Admission: EM | Admit: 2016-06-16 | Discharge: 2016-06-16 | Disposition: A | Discharge status: Home / Self Care | Payer: Medicare Other | Attending: Emergency Medicine | Admitting: Emergency Medicine

## 2016-06-16 DIAGNOSIS — M542 Cervicalgia: Secondary | ICD-10-CM | POA: Insufficient documentation

## 2016-06-16 DIAGNOSIS — R296 Repeated falls: Secondary | ICD-10-CM | POA: Insufficient documentation

## 2016-06-16 DIAGNOSIS — I10 Essential (primary) hypertension: Secondary | ICD-10-CM | POA: Diagnosis not present

## 2016-06-16 DIAGNOSIS — Z7982 Long term (current) use of aspirin: Secondary | ICD-10-CM | POA: Diagnosis not present

## 2016-06-16 DIAGNOSIS — I252 Old myocardial infarction: Secondary | ICD-10-CM | POA: Diagnosis not present

## 2016-06-16 DIAGNOSIS — Z79899 Other long term (current) drug therapy: Secondary | ICD-10-CM | POA: Insufficient documentation

## 2016-06-16 DIAGNOSIS — T148XXA Other injury of unspecified body region, initial encounter: Secondary | ICD-10-CM | POA: Diagnosis not present

## 2016-06-16 DIAGNOSIS — I2510 Atherosclerotic heart disease of native coronary artery without angina pectoris: Secondary | ICD-10-CM | POA: Diagnosis not present

## 2016-06-16 DIAGNOSIS — S0990XA Unspecified injury of head, initial encounter: Secondary | ICD-10-CM | POA: Diagnosis not present

## 2016-06-16 DIAGNOSIS — S199XXA Unspecified injury of neck, initial encounter: Secondary | ICD-10-CM | POA: Diagnosis not present

## 2016-06-16 LAB — COMPREHENSIVE METABOLIC PANEL
ALK PHOS: 157 U/L — AB (ref 38–126)
ALT: 36 U/L (ref 14–54)
ANION GAP: 12 (ref 5–15)
AST: 47 U/L — ABNORMAL HIGH (ref 15–41)
Albumin: 3.7 g/dL (ref 3.5–5.0)
BILIRUBIN TOTAL: 0.5 mg/dL (ref 0.3–1.2)
BUN: 16 mg/dL (ref 6–20)
CALCIUM: 9.8 mg/dL (ref 8.9–10.3)
CO2: 22 mmol/L (ref 22–32)
CREATININE: 0.82 mg/dL (ref 0.44–1.00)
Chloride: 102 mmol/L (ref 101–111)
GFR calc non Af Amer: >60 mL/min (ref >60)
GLUCOSE: 194 mg/dL — AB (ref 65–99)
Potassium: 4.1 mmol/L (ref 3.5–5.1)
Sodium: 136 mmol/L (ref 135–145)
TOTAL PROTEIN: 7.3 g/dL (ref 6.5–8.1)

## 2016-06-16 LAB — CBC WITH DIFFERENTIAL/PLATELET
Basophils Absolute: 0 10*3/uL (ref 0.0–0.1)
Basophils Relative: 0 %
Eosinophils Absolute: 0.1 10*3/uL (ref 0.0–0.7)
Eosinophils Relative: 2 %
HEMATOCRIT: 38.5 % (ref 36.0–46.0)
HEMOGLOBIN: 13.1 g/dL (ref 12.0–15.0)
LYMPHS PCT: 19 %
Lymphs Abs: 1.6 10*3/uL (ref 0.7–4.0)
MCH: 29.8 pg (ref 26.0–34.0)
MCHC: 34 g/dL (ref 30.0–36.0)
MCV: 87.7 fL (ref 78.0–100.0)
MONO ABS: 0.5 10*3/uL (ref 0.1–1.0)
MONOS PCT: 6 %
NEUTROS ABS: 6.1 10*3/uL (ref 1.7–7.7)
Neutrophils Relative %: 73 %
Platelets: 201 10*3/uL (ref 150–400)
RBC: 4.39 MIL/uL (ref 3.87–5.11)
RDW: 13.5 % (ref 11.5–15.5)
WBC: 8.3 10*3/uL (ref 4.0–10.5)

## 2016-06-16 LAB — URINALYSIS, ROUTINE W REFLEX MICROSCOPIC
BILIRUBIN URINE: NEGATIVE
Glucose, UA: 50 mg/dL — AB
Hgb urine dipstick: NEGATIVE
Ketones, ur: NEGATIVE mg/dL
LEUKOCYTES UA: NEGATIVE
NITRITE: NEGATIVE
PH: 7 (ref 5.0–8.0)
Protein, ur: 100 mg/dL — AB
SPECIFIC GRAVITY, URINE: 1.012 (ref 1.005–1.030)

## 2016-06-16 LAB — PHENYTOIN LEVEL, TOTAL: PHENYTOIN LVL: 6.7 ug/mL — AB (ref 10.0–20.0)

## 2016-06-16 NOTE — ED Triage Notes (Signed)
Patient comes fm [facility]. Found down. Unknown LOC b/c patient has hx of forgetfulness. Hx of CVA. Blood thinners. Neuro intact. Confused at baseline with time. ekg some depression noted. Hx cardiac. DNR. 158/100, 80 HR, 225 cbg, 20 RR, 98% RA. No IV. c-collar on but chronic neck pain.

## 2016-06-16 NOTE — ED Notes (Signed)
Patient ambulated per baseline.

## 2016-06-16 NOTE — ED Provider Notes (Signed)
[facility] DEPT Provider Note   CSN: [id number] Arrival date & time: 06/16/16  1541     History   Chief Complaint Chief Complaint  Patient presents with  . Fall    HPI Kathryn Lewis is a 71 y.o. female.  HPI Patient coming from [facility] for unwitnessed fall. Patient states she remembers being on the floor but does not remember the fall. She has chronic neck pain which is unchanged at this time. She denies headache or chest pain. No focal weakness or numbness. Daughters at bedside. States she's had multiple falls in the past. Has history of stroke and peripheral artery disease. Has known narrowing of arteries in the neck. Past Medical History:  Diagnosis Date  . Bipolar 1 disorder ([place])   . Carotid artery stenosis    50-69% bilateral ICA stenoses by velocity criteria but no visible plaque and ICA/CCA ratio 1.72 on right and 1.38 on left  . Coronary artery disease   . Hypertension   . Left adrenal mass ([facility])    2.5 cm L adrenal mass on CT per note of Dr. Seleta Rhymes 05/20/2014  . MI, old   . PAD (peripheral artery disease) ([place])    03/2014 - CT aortogram with lower extremity runoff (mild to moderate disease in common iliac arteries, complete occulusion of her bilateral external iliac arteries with heavily disease common femoral arteries. She also has disease to her SFAs bilaterally. Popliteal arteries and tibial vessel runoff appears to be adequate)  . Stroke [facility])     Patient Active Problem List   Diagnosis Date Noted  . Community acquired pneumonia 12/12/2015  . Generalized weakness 12/12/2015  . Diabetes mellitus type 2 in nonobese ([place]) 12/12/2015  . Essential hypertension 12/12/2015  . Pneumonia 12/12/2015  . Acute bronchitis   . Hypertensive urgency 04/17/2015  . Seizure disorder as sequela of cerebrovascular accident ([place]) 04/17/2015  . CAD (coronary artery disease) 04/17/2015    Past Surgical History:  Procedure Laterality Date  . BREAST SURGERY    . NECK  SURGERY      OB History    No data available       Home Medications    Prior to Admission medications   Medication Sig Start Date End Date Taking? Authorizing Provider  amLODipine (NORVASC) 10 MG tablet Take 1 tablet (10 mg total) by mouth daily. 04/18/15   Reyne Dumas, MD  aspirin 81 MG chewable tablet Chew 81 mg by mouth daily.    [provider]  atorvastatin (LIPITOR) 80 MG tablet Take 80 mg by mouth daily.    [provider]  Cholecalciferol (VITAMIN D) 2000 units CAPS Take 2,000 capsules by mouth daily.     [provider]  clopidogrel (PLAVIX) 75 MG tablet Take 75 mg by mouth daily.    [provider]  ferrous sulfate 325 (65 FE) MG tablet Take 325 mg by mouth daily.     [provider]  furosemide (LASIX) 40 MG tablet Take 40 mg by mouth daily as needed for edema.     [provider]  gabapentin (NEURONTIN) 100 MG capsule Take 100 mg by mouth 2 (two) times daily.  05/20/14   [provider]  isosorbide mononitrate (IMDUR) 30 MG 24 hr tablet Take 30 mg by mouth every morning.    [provider]  lamoTRIgine (LAMICTAL) 200 MG tablet Take 300 mg by mouth 2 (two) times daily.    [provider]  metoprolol tartrate (LOPRESSOR) 25 MG tablet  Take 25 mg by mouth 2 (two) times daily.    [provider]  naproxen (NAPROSYN) 500 MG tablet Take 500 mg by mouth 2 (two) times daily as needed for moderate pain.    [provider]  nitroGLYCERIN (NITROSTAT) 0.4 MG SL tablet Place 0.4 mg under the tongue every 5 (five) minutes as needed for chest pain.    [provider]  phenytoin (DILANTIN) 100 MG ER capsule Take 300 mg by mouth at bedtime.    [provider]  phenytoin (DILANTIN) 30 MG ER capsule Take 30 mg by mouth at bedtime.    [provider]  potassium chloride (K-DUR,KLOR-CON) 10 MEQ tablet Take 10 mEq by mouth daily. Take on Tuesday and thursday    [provider]  spironolactone (ALDACTONE) 25 MG tablet Take 1 tablet (25 mg total) by mouth daily. 04/18/15   Reyne Dumas, MD    Family History Family History  Problem Relation Age of Onset  . Hypertension Mother   . Hypertension Father     Social History Social History  Substance Use Topics  . Smoking status: Never Smoker  . Smokeless tobacco: Never Used  . Alcohol use No     Allergies   Amoxicillin; Augmentin [amoxicillin-pot clavulanate]; Cephalexin; Erythromycin; Niacin and related; and Other   Review of Systems Review of Systems  Constitutional: Negative for chills and fever.  Eyes: Negative for visual disturbance.  Respiratory: Negative for shortness of breath.   Cardiovascular: Negative for chest pain.  Gastrointestinal: Negative for abdominal pain, nausea and vomiting.  Genitourinary: Negative for dysuria, flank pain and frequency.  Musculoskeletal: Positive for neck pain. Negative for arthralgias and back pain.  Skin: Negative for rash and wound.  Neurological: Negative for dizziness, weakness, numbness and headaches.  Psychiatric/Behavioral: Positive for confusion.  All other systems reviewed and are negative.    Physical Exam Updated Vital Signs BP (!) 164/94   Pulse 75   Resp 16   Ht 5\' 3"  (1.6 m)   Wt 77.1 kg (170 lb)   SpO2 98%   BMI 30.11 kg/m   Physical Exam  Constitutional: She appears well-developed and well-nourished. No distress.  HENT:  Head: Normocephalic and atraumatic.  Mouth/Throat: Oropharynx is clear and moist.  No evidence of any neck or facial injuries.  Eyes: EOM are normal. Pupils are equal, round, and reactive to light.  Neck: Normal range of motion. Neck supple.  Patient is in cervical collar. She has very mild midline cervical tenderness to palpation. Questionable bruit on the left.  Cardiovascular: Normal rate and regular rhythm.  Exam reveals no gallop and no friction rub.   No murmur heard. Pulmonary/Chest: Effort  normal and breath sounds normal. No respiratory distress. She has no wheezes. She has no rales. She exhibits no tenderness.  Abdominal: Soft. Bowel sounds are normal. There is no tenderness. There is no rebound and no guarding.  Musculoskeletal: Normal range of motion. She exhibits no edema or tenderness.  No midline thoracic or lumbar tenderness. Pelvis is stable.  Neurological: She is alert.  5/5 motor in all extremities. Sensation fully intact. Patient is alert and interactive. Following commands. Mild confusion and short-term amnesia.  Skin: Skin is warm and dry. Capillary refill takes less than 2 seconds. No rash noted. No erythema.  Psychiatric: She has a normal mood and affect. Her behavior is normal.  Nursing note and vitals reviewed.    ED Treatments / Results  Labs (all labs ordered are listed, but  only abnormal results are displayed) Labs Reviewed  COMPREHENSIVE METABOLIC PANEL - Abnormal; Notable for the following:       Result Value   Glucose, Bld 194 (*)    AST 47 (*)    Alkaline Phosphatase 157 (*)    All other components within normal limits  URINALYSIS, ROUTINE W REFLEX MICROSCOPIC - Abnormal; Notable for the following:    Glucose, UA 50 (*)    Protein, ur 100 (*)    Bacteria, UA RARE (*)    Squamous Epithelial / LPF 0-5 (*)    All other components within normal limits  PHENYTOIN LEVEL, TOTAL - Abnormal; Notable for the following:    Phenytoin Lvl 6.7 (*)    All other components within normal limits  CBC WITH DIFFERENTIAL/PLATELET    EKG  EKG Interpretation  Date/Time:  Wednesday Jun 16 2016 16:13:01 EDT Ventricular Rate:  79 PR Interval:    QRS Duration: 100 QT Interval:  371 QTC Calculation: 426 R Axis:   73 Text Interpretation:  Sinus rhythm Nonspecific repol abnormality, diffuse leads When compared with ECG of 12/11/2015, No significant change was found Confirmed by Delora Fuel ([id number]) on 06/16/2016 6:09:59 PM       Radiology Ct Head Wo  Contrast  Result Date: 06/16/2016 CLINICAL DATA:  Patient found down. Unable to provide history. Confused at baseline. EXAM: CT HEAD WITHOUT CONTRAST CT CERVICAL SPINE WITHOUT CONTRAST TECHNIQUE: Multidetector CT imaging of the head and cervical spine was performed following the standard protocol without intravenous contrast. Multiplanar CT image reconstructions of the cervical spine were also generated. COMPARISON:  CT head and cervical spine 04/29/2016. FINDINGS: CT HEAD FINDINGS Brain: No evidence for acute infarction, hemorrhage, mass lesion, hydrocephalus, or extra-axial fluid. Generalized atrophy. Large remote RIGHT posterior temporal and medial occipital infarction, with encephalomalacia and gliosis. Generalized hypoattenuation of white matter representing chronic microvascular ischemic change elsewhere. Vascular: Vascular calcification.  No hyperdense vessel. Skull: RIGHT parietal venous [place] is stable. No skull fracture or worrisome osseous lesion. Sinuses/Orbits: BILATERAL cataract extraction. Other: None. CT CERVICAL SPINE FINDINGS Alignment: Straightening of the normal cervical lordosis. Degenerative anterolisthesis at C3-4 and C4-5 is compensatory, in part related to the lower cervical fusion. Skull base and vertebrae: No skull base abnormality. No cervical spine fracture. Solid arthrodesis C5-6 status post ACDF. Soft tissues and spinal canal: No prevertebral fluid or swelling. No visible canal hematoma. There is carotid atherosclerosis with dolichoectatic cervical ICA segments. Disc levels: Residual spinal stenosis at the C5-6 fusion segment. Degenerative anterolisthesis at C3-4 and C4-5 related to facet disease, combined with uncinate spurring, contributes to significant foraminal narrowing worse on the RIGHT. No acute disc protrusion is observed. Upper chest: No pneumothorax or mass. Other: None. IMPRESSION: Chronic changes as described. No skull fracture or intracranial hemorrhage. No cervical  spine fracture or traumatic subluxation. Similar appearance to prior most recent imaging in April 2018. Electronically Signed   By: Staci Righter M.D.   On: 06/16/2016 16:57   Ct Cervical Spine Wo Contrast  Result Date: 06/16/2016 CLINICAL DATA:  Patient found down. Unable to provide history. Confused at baseline. EXAM: CT HEAD WITHOUT CONTRAST CT CERVICAL SPINE WITHOUT CONTRAST TECHNIQUE: Multidetector CT imaging of the head and cervical spine was performed following the standard protocol without intravenous contrast. Multiplanar CT image reconstructions of the cervical spine were also generated. COMPARISON:  CT head and cervical spine 04/29/2016. FINDINGS: CT HEAD FINDINGS Brain: No evidence for acute infarction, hemorrhage, mass lesion, hydrocephalus, or extra-axial fluid. Generalized atrophy.  Large remote RIGHT posterior temporal and medial occipital infarction, with encephalomalacia and gliosis. Generalized hypoattenuation of white matter representing chronic microvascular ischemic change elsewhere. Vascular: Vascular calcification.  No hyperdense vessel. Skull: RIGHT parietal venous [place] is stable. No skull fracture or worrisome osseous lesion. Sinuses/Orbits: BILATERAL cataract extraction. Other: None. CT CERVICAL SPINE FINDINGS Alignment: Straightening of the normal cervical lordosis. Degenerative anterolisthesis at C3-4 and C4-5 is compensatory, in part related to the lower cervical fusion. Skull base and vertebrae: No skull base abnormality. No cervical spine fracture. Solid arthrodesis C5-6 status post ACDF. Soft tissues and spinal canal: No prevertebral fluid or swelling. No visible canal hematoma. There is carotid atherosclerosis with dolichoectatic cervical ICA segments. Disc levels: Residual spinal stenosis at the C5-6 fusion segment. Degenerative anterolisthesis at C3-4 and C4-5 related to facet disease, combined with uncinate spurring, contributes to significant foraminal narrowing worse on the  RIGHT. No acute disc protrusion is observed. Upper chest: No pneumothorax or mass. Other: None. IMPRESSION: Chronic changes as described. No skull fracture or intracranial hemorrhage. No cervical spine fracture or traumatic subluxation. Similar appearance to prior most recent imaging in April 2018. Electronically Signed   By: Staci Righter M.D.   On: 06/16/2016 16:57    Procedures Procedures (including critical care time)  Medications Ordered in ED Medications - No data to display   Initial Impression / Assessment and Plan / ED Course  I have reviewed the triage vital signs and the nursing notes.  Pertinent labs & imaging results that were available during my care of the patient were reviewed by me and considered in my medical decision making (see chart for details).     Patient is able to ambulate at her baseline. She appears to be at her baseline mental status. No acute findings on CT head or cervical spine. If possible. The patient had a seizure results for her fall and amnesia. She will need to follow-up with her neurologist. Return precautions given.  Final Clinical Impressions(s) / ED Diagnoses   Final diagnoses:  Frequent falls    New Prescriptions New Prescriptions   No medications on file     Julianne Rice, MD 06/16/16 [phone]

## 2016-08-18 DIAGNOSIS — I509 Heart failure, unspecified: Secondary | ICD-10-CM | POA: Diagnosis not present

## 2016-08-18 DIAGNOSIS — R0602 Shortness of breath: Secondary | ICD-10-CM | POA: Diagnosis not present

## 2016-08-18 DIAGNOSIS — J449 Chronic obstructive pulmonary disease, unspecified: Secondary | ICD-10-CM | POA: Diagnosis not present

## 2016-08-18 DIAGNOSIS — G4730 Sleep apnea, unspecified: Secondary | ICD-10-CM | POA: Diagnosis not present

## 2016-08-18 LAB — PULMONARY FUNCTION TEST

## 2016-08-19 DIAGNOSIS — G4733 Obstructive sleep apnea (adult) (pediatric): Secondary | ICD-10-CM | POA: Diagnosis not present

## 2016-08-19 DIAGNOSIS — Z8659 Personal history of other mental and behavioral disorders: Secondary | ICD-10-CM | POA: Diagnosis not present

## 2016-08-19 DIAGNOSIS — I639 Cerebral infarction, unspecified: Secondary | ICD-10-CM | POA: Diagnosis not present

## 2016-08-19 DIAGNOSIS — I509 Heart failure, unspecified: Secondary | ICD-10-CM | POA: Diagnosis not present

## 2016-09-01 DIAGNOSIS — M545 Low back pain: Secondary | ICD-10-CM | POA: Diagnosis not present

## 2016-09-01 DIAGNOSIS — G8929 Other chronic pain: Secondary | ICD-10-CM | POA: Diagnosis not present

## 2016-09-01 DIAGNOSIS — I10 Essential (primary) hypertension: Secondary | ICD-10-CM | POA: Diagnosis not present

## 2016-09-01 DIAGNOSIS — E7800 Pure hypercholesterolemia, unspecified: Secondary | ICD-10-CM | POA: Diagnosis not present

## 2016-09-01 DIAGNOSIS — E119 Type 2 diabetes mellitus without complications: Secondary | ICD-10-CM | POA: Diagnosis not present

## 2016-11-16 DIAGNOSIS — G4733 Obstructive sleep apnea (adult) (pediatric): Secondary | ICD-10-CM | POA: Diagnosis not present

## 2016-11-16 DIAGNOSIS — J449 Chronic obstructive pulmonary disease, unspecified: Secondary | ICD-10-CM | POA: Diagnosis not present

## 2016-11-16 DIAGNOSIS — R0602 Shortness of breath: Secondary | ICD-10-CM | POA: Diagnosis not present

## 2016-11-23 DIAGNOSIS — I639 Cerebral infarction, unspecified: Secondary | ICD-10-CM | POA: Diagnosis not present

## 2016-11-23 DIAGNOSIS — I739 Peripheral vascular disease, unspecified: Secondary | ICD-10-CM | POA: Diagnosis not present

## 2016-11-23 DIAGNOSIS — Z8659 Personal history of other mental and behavioral disorders: Secondary | ICD-10-CM | POA: Diagnosis not present

## 2016-11-23 DIAGNOSIS — G4733 Obstructive sleep apnea (adult) (pediatric): Secondary | ICD-10-CM | POA: Diagnosis not present

## 2016-12-22 DIAGNOSIS — E7800 Pure hypercholesterolemia, unspecified: Secondary | ICD-10-CM | POA: Diagnosis not present

## 2016-12-22 DIAGNOSIS — E559 Vitamin D deficiency, unspecified: Secondary | ICD-10-CM | POA: Diagnosis not present

## 2016-12-22 DIAGNOSIS — E119 Type 2 diabetes mellitus without complications: Secondary | ICD-10-CM | POA: Diagnosis not present

## 2016-12-22 DIAGNOSIS — I10 Essential (primary) hypertension: Secondary | ICD-10-CM | POA: Diagnosis not present

## 2016-12-22 DIAGNOSIS — R5383 Other fatigue: Secondary | ICD-10-CM | POA: Diagnosis not present

## 2016-12-22 DIAGNOSIS — Z0000 Encounter for general adult medical examination without abnormal findings: Secondary | ICD-10-CM | POA: Diagnosis not present

## 2016-12-24 ENCOUNTER — Encounter: Payer: Self-pay | Admitting: Pulmonary Disease

## 2016-12-24 ENCOUNTER — Ambulatory Visit (INDEPENDENT_AMBULATORY_CARE_PROVIDER_SITE_OTHER): Payer: Medicare Other | Admitting: Pulmonary Disease

## 2016-12-24 VITALS — BP 110/80 | HR 78 | Ht 63.0 in | Wt 134.0 lb

## 2016-12-24 DIAGNOSIS — J432 Centrilobular emphysema: Secondary | ICD-10-CM | POA: Diagnosis not present

## 2016-12-24 DIAGNOSIS — R911 Solitary pulmonary nodule: Secondary | ICD-10-CM | POA: Insufficient documentation

## 2016-12-24 DIAGNOSIS — G40909 Epilepsy, unspecified, not intractable, without status epilepticus: Secondary | ICD-10-CM

## 2016-12-24 DIAGNOSIS — G4733 Obstructive sleep apnea (adult) (pediatric): Secondary | ICD-10-CM | POA: Diagnosis not present

## 2016-12-24 DIAGNOSIS — J441 Chronic obstructive pulmonary disease with (acute) exacerbation: Secondary | ICD-10-CM | POA: Diagnosis not present

## 2016-12-24 DIAGNOSIS — Z9989 Dependence on other enabling machines and devices: Secondary | ICD-10-CM | POA: Diagnosis not present

## 2016-12-24 DIAGNOSIS — I69398 Other sequelae of cerebral infarction: Secondary | ICD-10-CM

## 2016-12-24 DIAGNOSIS — J449 Chronic obstructive pulmonary disease, unspecified: Secondary | ICD-10-CM | POA: Insufficient documentation

## 2016-12-24 MED ORDER — ALBUTEROL SULFATE HFA 108 (90 BASE) MCG/ACT IN AERS: 2.0000 | INHALATION_SPRAY | Freq: Four times a day (QID) | RESPIRATORY_TRACT | 6 refills | Status: DC | PRN

## 2016-12-24 MED ORDER — SPACER/AERO CHAMBER MOUTHPIECE MISC: 1.0000 | Freq: Every day | 0 refills | Status: AC | PRN

## 2016-12-24 NOTE — Progress Notes (Signed)
Subjective:    Patient ID: Kathryn Lewis, female    DOB: 12-Sep-1945, 71 y.o.   MRN: [id number]  HPI   71 year old ex-smoker presents to establish care for COPD and OSA. She is transferring care from [place] clearance and still awaiting a PCP appointment.  She has bipolar disorder and CVA in 2015 and at that time moved from [place] to live with her daughter, Kathryn Lewis.  But a year ago she transferred to assisted living at [facility].    She is a diabetic, hypertensive.  She smoked more than 50 pack years before she quit in 2015, still has the urge and wakes occasionally. She also has seizure disorder and requests referral to neurology  She was diagnosed with COPD many years ago I reviewed PFTs from 2015 -with suggest mild obstruction, with out of proportion decrease in DLCO.  CT chest from 2013 reportedly showed tiny subpleural nodules, mild scarring at the bases without evidence of interstitial lung disease  OSA was diagnosed in 2017 after sleep study showed 13 events per hour per daughter, was started on CPAP of 15 cm with full facemask and this was recently decreased to 12 cm, D[facility].  He reports problems with 1 of her magnetic clips, and the mask being broken and therefore she does not have a good seal on her mask.  She states that she is not rested and remains cranky on waking up feels that she slept much better during her sleep study  CPAP download was reviewed, this shows residual AHI of 8/hour on 12 cm with average usage of 5.5 hours per night and moderate leak  Spirometry today shows moderate restriction with ratio of 83, FEV1 of 56% and FVC of 51%  Significant tests/ events reviewed  PFTs 2013  spirometry really does not show severe obstruction Per FVC is 76% of predicted Her FEV1 is 70% of predicted at 1.42 L  ratio  71% Her mid maximal flow rates are decreased at 50% with a mild improvement after bronchodilators  Lung volumes are all  normal Total lung capacity 87% of predicted.  DLCO decreased at 55% of predicted    Past Medical History:  Diagnosis Date  . Bipolar 1 disorder ([place])   . Carotid artery stenosis    50-69% bilateral ICA stenoses by velocity criteria but no visible plaque and ICA/CCA ratio 1.72 on right and 1.38 on left  . Coronary artery disease   . Hypertension   . Left adrenal mass ([facility])    2.5 cm L adrenal mass on CT per note of Dr. Seleta Rhymes 05/20/2014  . MI, old   . PAD (peripheral artery disease) ([place])    03/2014 - CT aortogram with lower extremity runoff (mild to moderate disease in common iliac arteries, complete occulusion of her bilateral external iliac arteries with heavily disease common femoral arteries. She also has disease to her SFAs bilaterally. Popliteal arteries and tibial vessel runoff appears to be adequate)  . Stroke [facility])     Past Surgical History:  Procedure Laterality Date  . BREAST SURGERY    . NECK SURGERY     Allergies  Allergen Reactions  . Amoxicillin Other (See Comments)    colitis  . Augmentin [Amoxicillin-Pot Clavulanate] Other (See Comments)    colitis  . Cephalexin Other (See Comments)    colitis  . Erythromycin Other (See Comments)    colitis  . Niacin And Related Other (See Comments)    colitis  .  Other Other (See Comments)    Social History   Socioeconomic History  . Marital status: Single    Spouse name: Not on file  . Number of children: Not on file  . Years of education: Not on file  . Highest education level: Not on file  Social Needs  . Financial resource strain: Not on file  . Food insecurity - worry: Not on file  . Food insecurity - inability: Not on file  . Transportation needs - medical: Not on file  . Transportation needs - non-medical: Not on file  Occupational History  . Not on file  Tobacco Use  . Smoking status: Former Smoker    Packs/day: 1.00    Types: Cigarettes    Last attempt to quit: 2015    Years since quitting: 3.9  .  Smokeless tobacco: Never Used  Substance and Sexual Activity  . Alcohol use: No  . Drug use: Not on file  . Sexual activity: Not on file  Other Topics Concern  . Not on file  Social History Narrative  . Not on file      Family History  Problem Relation Age of Onset  . Hypertension Mother   . Hypertension Father      Review of Systems Positive for shortness of breath with exertion, Anxiety and depression, joint stiffness  Constitutional: negative for anorexia, fevers and sweats  Eyes: negative for irritation, redness and visual disturbance  Ears, nose, mouth, throat, and face: negative for earaches, epistaxis, nasal congestion and sore throat  Respiratory: negative for cough, , sputum and wheezing  Cardiovascular: negative for chest pain,  lower extremity edema, orthopnea, palpitations and syncope  Gastrointestinal: negative for abdominal pain, constipation, diarrhea, melena, nausea and vomiting  Genitourinary:negative for dysuria, frequency and hematuria  Hematologic/lymphatic: negative for bleeding, easy bruising and lymphadenopathy  Musculoskeletal:negative for arthralgias, muscle weakness and stiff joints  Neurological: negative for coordination problems, gait problems, headaches and weakness  Endocrine: negative for diabetic symptoms including polydipsia, polyuria and weight loss      Objective:   Physical Exam  Gen. Pleasant, well-nourished, in no distress,  affect ENT - no lesions, no post nasal drip Neck: No JVD, no thyromegaly, no carotid bruits Lungs: no use of accessory muscles, no dullness to percussion, clear without rales or rhonchi  Cardiovascular: Rhythm regular, heart sounds  normal, no murmurs or gallops, no peripheral edema Abdomen: soft and non-tender, no hepatosplenomegaly, BS normal. Musculoskeletal: No deformities, no cyanosis or clubbing Neuro:  alert, non focal       Assessment & Plan:

## 2016-12-24 NOTE — Assessment & Plan Note (Signed)
CPAP settings 12 cmseem to be adequate. Obtain new full facemask, hopefully this will decrease your leak.  Recheck download in 2 weeks. If not better, will need visit to sleep lab for mask desensitization  Weight loss encouraged, compliance with goal of at least 4-6 hrs every night is the expectation. Advised against medications with sedative side effects Cautioned against driving when sleepy - understanding that sleepiness will vary on a day to day basis

## 2016-12-24 NOTE — Patient Instructions (Addendum)
CPAP settings seem to be adequate. Obtain new full facemask, hopefully this will decrease your leak.  You do not need Symbicort anymore. Use albuterol MDI 2 puffs every 6 hours as needed. Prescription for spacer will also be sent   Referral to [facility] neurology

## 2016-12-24 NOTE — Addendum Note (Signed)
Addended by: Valerie Salts on: 12/24/2016 11:09 AM   Modules accepted: Orders

## 2016-12-24 NOTE — Assessment & Plan Note (Signed)
Referral to [facility] neurology

## 2016-12-24 NOTE — Assessment & Plan Note (Signed)
-  does not need Symbicort  Use albuterol MDI 2 puffs every 6 hours as needed. Prescription for spacer will also be sent

## 2016-12-24 NOTE — Assessment & Plan Note (Signed)
Will need follow-up CT chest-we will try to obtain imaging studies from [facility] before we order

## 2016-12-27 ENCOUNTER — Encounter: Payer: Self-pay | Admitting: Neurology

## 2016-12-29 ENCOUNTER — Telehealth: Payer: Self-pay | Admitting: Pulmonary Disease

## 2016-12-29 NOTE — Telephone Encounter (Signed)
Reviewed reports from [place] chest 03/2016 no nodules noted -NP SG 03/2016 severe OSA with AHI 34/hour corrected by CPAP 10 cm, weight 143 pounds

## 2017-01-07 ENCOUNTER — Ambulatory Visit (INDEPENDENT_AMBULATORY_CARE_PROVIDER_SITE_OTHER): Payer: Medicare Other

## 2017-01-07 ENCOUNTER — Other Ambulatory Visit: Payer: Self-pay

## 2017-01-07 ENCOUNTER — Encounter (HOSPITAL_COMMUNITY): Payer: Self-pay | Admitting: Emergency Medicine

## 2017-01-07 ENCOUNTER — Ambulatory Visit (HOSPITAL_COMMUNITY)
Admission: EM | Admit: 2017-01-07 | Discharge: 2017-01-07 | Disposition: A | Discharge status: Home / Self Care | Payer: Medicare Other | Attending: Internal Medicine | Admitting: Internal Medicine

## 2017-01-07 DIAGNOSIS — W19XXXA Unspecified fall, initial encounter: Secondary | ICD-10-CM

## 2017-01-07 DIAGNOSIS — M546 Pain in thoracic spine: Secondary | ICD-10-CM | POA: Diagnosis not present

## 2017-01-07 DIAGNOSIS — M25511 Pain in right shoulder: Secondary | ICD-10-CM

## 2017-01-07 DIAGNOSIS — S4991XA Unspecified injury of right shoulder and upper arm, initial encounter: Secondary | ICD-10-CM | POA: Diagnosis not present

## 2017-01-07 MED ORDER — NAPROXEN 500 MG PO TABS: 500.0000 mg | ORAL_TABLET | Freq: Two times a day (BID) | ORAL | 2 refills | Status: DC

## 2017-01-07 NOTE — Discharge Instructions (Signed)
No fractures which is good. Probably just contusion. Nice to meet you. May use Naprosyn every 12 hours for pain. FU with PCP if needed.

## 2017-01-07 NOTE — ED Provider Notes (Signed)
[facility]    CSN: [id number] Arrival date & time: 01/07/17  1726     History   Chief Complaint Chief Complaint  Patient presents with  . Fall    HPI Kathryn Lewis is a 71 y.o. female.      Who presents after sustaining a fall at assisted living. Patient reports that she missed her wheelchair and fell back. The fall was not witnessed but patient states she has pain in her right shoulder and upper right side of her back. No sob. She uses her wheelchair at assisted living.       Past Medical History:  Diagnosis Date  . Bipolar 1 disorder ([place])   . Carotid artery stenosis    50-69% bilateral ICA stenoses by velocity criteria but no visible plaque and ICA/CCA ratio 1.72 on right and 1.38 on left  . Coronary artery disease   . Hypertension   . Left adrenal mass ([facility])    2.5 cm L adrenal mass on CT per note of Dr. Seleta Rhymes 05/20/2014  . MI, old   . PAD (peripheral artery disease) ([facility])    03/2014 - CT aortogram with lower extremity runoff (mild to moderate disease in common iliac arteries, complete occulusion of her bilateral external iliac arteries with heavily disease common femoral arteries. She also has disease to her SFAs bilaterally. Popliteal arteries and tibial vessel runoff appears to be adequate)  . Stroke [facility])     Patient Active Problem List   Diagnosis Date Noted  . OSA on CPAP 12/24/2016  . COPD (chronic obstructive pulmonary disease) ([place]) 12/24/2016  . Pulmonary nodule 12/24/2016  . Generalized weakness 12/12/2015  . Diabetes mellitus type 2 in nonobese ([place]) 12/12/2015  . Essential hypertension 12/12/2015  . Hypertensive urgency 04/17/2015  . Seizure disorder as sequela of cerebrovascular accident ([place]) 04/17/2015  . CAD (coronary artery disease) 04/17/2015    Past Surgical History:  Procedure Laterality Date  . BREAST SURGERY    . NECK SURGERY      OB History    No data available       Home Medications    Prior to Admission  medications   Medication Sig Start Date End Date Taking? Authorizing Provider  albuterol (PROVENTIL HFA;VENTOLIN HFA) 108 (90 Base) MCG/ACT inhaler Inhale 2 puffs into the lungs every 6 (six) hours as needed for wheezing or shortness of breath.   Yes [provider]  albuterol (PROVENTIL HFA;VENTOLIN HFA) 108 (90 Base) MCG/ACT inhaler Inhale 2 puffs into the lungs every 6 (six) hours as needed for wheezing or shortness of breath. 12/24/16  Yes Rigoberto Noel, MD  amLODipine (NORVASC) 10 MG tablet Take 1 tablet (10 mg total) by mouth daily. 04/18/15  Yes Reyne Dumas, MD  aspirin 81 MG chewable tablet Chew 81 mg by mouth daily.   Yes [provider]  atorvastatin (LIPITOR) 80 MG tablet Take 80 mg by mouth daily.   Yes [provider]  Cholecalciferol (VITAMIN D) 2000 units CAPS Take 2,000 capsules by mouth daily.    Yes [provider]  clopidogrel (PLAVIX) 75 MG tablet Take 75 mg by mouth daily.   Yes [provider]  ferrous sulfate 325 (65 FE) MG tablet Take 325 mg by mouth daily.    Yes [provider]  furosemide (LASIX) 40 MG tablet Take 40 mg by mouth daily as needed for edema.    Yes [provider]  gabapentin (NEURONTIN) 100 MG capsule Take 100 mg by  mouth 2 (two) times daily.  05/20/14  Yes [provider]  isosorbide mononitrate (IMDUR) 30 MG 24 hr tablet Take 30 mg by mouth every morning.   Yes [provider]  lamoTRIgine (LAMICTAL) 200 MG tablet Take 300 mg by mouth 2 (two) times daily.   Yes [provider]  metFORMIN (GLUCOPHAGE) 500 MG tablet Take 500 mg by mouth 2 (two) times daily with a meal.   Yes [provider]  metoprolol tartrate (LOPRESSOR) 25 MG tablet Take 25 mg by mouth 2 (two) times daily.   Yes [provider]  nitroGLYCERIN (NITROSTAT) 0.4 MG SL tablet Place 0.4 mg under the tongue every 5 (five) minutes as needed for chest pain.   Yes [provider]    phenytoin (DILANTIN) 100 MG ER capsule Take 300 mg by mouth at bedtime.   Yes [provider]  phenytoin (DILANTIN) 30 MG ER capsule Take 30 mg by mouth at bedtime.   Yes [provider]  potassium chloride (K-DUR,KLOR-CON) 10 MEQ tablet Take 10 mEq by mouth daily. Take on Tuesday and thursday   Yes [provider]  Spacer/Aero Chamber Mouthpiece MISC 1 Device by Does not apply route daily as needed. 12/24/16  Yes Rigoberto Noel, MD  spironolactone (ALDACTONE) 25 MG tablet Take 1 tablet (25 mg total) by mouth daily. 04/18/15  Yes Reyne Dumas, MD  naproxen (NAPROSYN) 500 MG tablet Take 1 tablet (500 mg total) by mouth 2 (two) times daily with a meal. 01/07/17   Bjorn Pippin, PA-C    Family History Family History  Problem Relation Age of Onset  . Hypertension Mother   . Hypertension Father     Social History Social History   Tobacco Use  . Smoking status: Former Smoker    Packs/day: 1.00    Types: Cigarettes    Last attempt to quit: 2015    Years since quitting: 3.9  . Smokeless tobacco: Never Used  Substance Use Topics  . Alcohol use: No  . Drug use: Not on file     Allergies   Amoxicillin; Augmentin [amoxicillin-pot clavulanate]; Cephalexin; Erythromycin; Niacin and related; and Other   Review of Systems Review of Systems  All other systems reviewed and are negative.    Physical Exam Triage Vital Signs ED Triage Vitals  Enc Vitals Group     BP 01/07/17 1750 (!) 147/81     Pulse Rate 01/07/17 1750 76     Resp --      Temp 01/07/17 1750 98.9 F (37.2 C)     Temp Source 01/07/17 1750 Oral     SpO2 01/07/17 1750 96 %     Weight --      Height --      Head Circumference --      Peak Flow --      Pain Score 01/07/17 1747 8     Pain Loc --      Pain Edu? --      Excl. in [place]? --    No data found.  Updated Vital Signs BP (!) 147/81 (BP Location: Left Arm)   Pulse 76   Temp 98.9 F (37.2 C) (Oral)   LMP 03/17/2015   SpO2  96%   Visual Acuity Right Eye Distance:   Left Eye Distance:   Bilateral Distance:    Right Eye Near:   Left Eye Near:    Bilateral Near:     Physical Exam  Constitutional: She is oriented  to person, place, and time. She appears well-developed and well-nourished. No distress.  HENT:  Head: Normocephalic and atraumatic.  Musculoskeletal: She exhibits tenderness. She exhibits no edema or deformity.  Patient with pain to touch throughout humeral head and upper right para thoracic region. No ecchymosis is noted and no deformity. Hesitant for ROM of the right shoulder but full active ROM  Neurological: She is alert and oriented to person, place, and time.  Skin: Skin is warm and dry. She is not diaphoretic.  Psychiatric: Her behavior is normal.  Nursing note and vitals reviewed.    UC Treatments / Results  Labs (all labs ordered are listed, but only abnormal results are displayed) Labs Reviewed - No data to display  EKG  EKG Interpretation None       Radiology Dg Thoracic Spine 2 View  Result Date: 01/07/2017 CLINICAL DATA:  Fall, right shoulder and upper back pain. EXAM: THORACIC SPINE 2 VIEWS COMPARISON:  Chest x-ray 12/11/2015 FINDINGS: Normal alignment. No fracture. Early anterior spurring throughout the thoracic spine. IMPRESSION: No acute findings. Electronically Signed   By: Rolm Baptise M.D.   On: 01/07/2017 19:08   Dg Shoulder Right  Result Date: 01/07/2017 CLINICAL DATA:  Fall, right shoulder injury. EXAM: RIGHT SHOULDER - 2+ VIEW COMPARISON:  12/12/2015 FINDINGS: Mild degenerative changes in the right AC and glenohumeral joints with joint space narrowing and spurring. No acute bony abnormality. Specifically, no fracture, subluxation, or dislocation. Soft tissues are intact. IMPRESSION: Mild degenerative changes.  No acute bony abnormality. Electronically Signed   By: Rolm Baptise M.D.   On: 01/07/2017 19:07    Procedures Procedures (including critical care  time)  Medications Ordered in UC Medications - No data to display   Initial Impression / Assessment and Plan / UC Course  I have reviewed the triage vital signs and the nursing notes.  Pertinent labs & imaging results that were available during my care of the patient were reviewed by me and considered in my medical decision making (see chart for details).     No fractures by xrays. Will treat with NSAID and rest. FU if worsens or concerns.   Final Clinical Impressions(s) / UC Diagnoses   Final diagnoses:  Fall, initial encounter  Pain in joint of right shoulder  Acute right-sided thoracic back pain    ED Discharge Orders        Ordered    naproxen (NAPROSYN) 500 MG tablet  2 times daily with meals     01/07/17 1932       Controlled Substance Prescriptions [place] Controlled Substance Registry consulted? Not Applicable   Prudencio Pair 01/07/17 2004

## 2017-01-07 NOTE — ED Triage Notes (Addendum)
Pt was either getting up from or trying to sit in her wheelchair when it rolled out from under her and she injured her right arm and shoulder.  Pt's daughter (HPOA) states she has some bruising on her right flank at her ribs.  She is living in an ALF and the fall was not witnessed.

## 2017-01-10 ENCOUNTER — Ambulatory Visit (INDEPENDENT_AMBULATORY_CARE_PROVIDER_SITE_OTHER): Payer: Medicare Other | Admitting: Adult Health

## 2017-01-10 ENCOUNTER — Encounter: Payer: Self-pay | Admitting: Family Medicine

## 2017-01-10 ENCOUNTER — Encounter: Payer: Self-pay | Admitting: Adult Health

## 2017-01-10 ENCOUNTER — Ambulatory Visit (INDEPENDENT_AMBULATORY_CARE_PROVIDER_SITE_OTHER): Payer: Medicare Other | Admitting: Family Medicine

## 2017-01-10 VITALS — BP 136/88 | HR 71 | Temp 97.8°F | Ht 63.0 in | Wt 136.2 lb

## 2017-01-10 VITALS — BP 126/64 | HR 70 | Ht 63.0 in | Wt 134.0 lb

## 2017-01-10 DIAGNOSIS — I2510 Atherosclerotic heart disease of native coronary artery without angina pectoris: Secondary | ICD-10-CM

## 2017-01-10 DIAGNOSIS — R2681 Unsteadiness on feet: Secondary | ICD-10-CM

## 2017-01-10 DIAGNOSIS — I10 Essential (primary) hypertension: Secondary | ICD-10-CM

## 2017-01-10 DIAGNOSIS — E1169 Type 2 diabetes mellitus with other specified complication: Secondary | ICD-10-CM | POA: Diagnosis not present

## 2017-01-10 DIAGNOSIS — E785 Hyperlipidemia, unspecified: Secondary | ICD-10-CM

## 2017-01-10 DIAGNOSIS — J449 Chronic obstructive pulmonary disease, unspecified: Secondary | ICD-10-CM | POA: Diagnosis not present

## 2017-01-10 DIAGNOSIS — E1149 Type 2 diabetes mellitus with other diabetic neurological complication: Secondary | ICD-10-CM

## 2017-01-10 DIAGNOSIS — I2583 Coronary atherosclerosis due to lipid rich plaque: Secondary | ICD-10-CM | POA: Diagnosis not present

## 2017-01-10 DIAGNOSIS — Z9989 Dependence on other enabling machines and devices: Secondary | ICD-10-CM | POA: Diagnosis not present

## 2017-01-10 DIAGNOSIS — G4733 Obstructive sleep apnea (adult) (pediatric): Secondary | ICD-10-CM

## 2017-01-10 MED ORDER — SPIRONOLACTONE 25 MG PO TABS: 25.0000 mg | ORAL_TABLET | Freq: Every day | ORAL | 0 refills | Status: DC

## 2017-01-10 MED ORDER — METFORMIN HCL 500 MG PO TABS: 500.0000 mg | ORAL_TABLET | Freq: Two times a day (BID) | ORAL | 2 refills | Status: DC

## 2017-01-10 MED ORDER — AMLODIPINE BESYLATE 10 MG PO TABS: 10.0000 mg | ORAL_TABLET | Freq: Every day | ORAL | 2 refills | Status: DC

## 2017-01-10 MED ORDER — METOPROLOL TARTRATE 25 MG PO TABS: 25.0000 mg | ORAL_TABLET | Freq: Two times a day (BID) | ORAL | 2 refills | Status: DC

## 2017-01-10 MED ORDER — CLOPIDOGREL BISULFATE 75 MG PO TABS: 75.0000 mg | ORAL_TABLET | Freq: Every day | ORAL | 2 refills | Status: DC

## 2017-01-10 MED ORDER — ATORVASTATIN CALCIUM 80 MG PO TABS: 80.0000 mg | ORAL_TABLET | Freq: Every day | ORAL | 3 refills | Status: DC

## 2017-01-10 NOTE — Progress Notes (Signed)
HPI:   Kathryn Lewis is a 71 y.o. female, who is here today with her daughter to establish care.  Former PCP: Dr Nancy Fetter Last preventive routine visit: 6-12 months.   Chronic medical problems: DM II, HTN,depression,OSA on CPAP, OA,CAD,COPD, and unstable gait among some.   DM II:  Dx 2008. She was on insulin before, after her daughter moved with her and helped her to change her diet she was able to stop insulin and now on Metformin. She recently moved to [facility] (assisted living) and daughter is concerned about her diet, BS's are running high.  Denies abdominal pain, nausea,vomiting, polydipsia,polyuria, or polyphagia.  Last HgA1C 2 weeks ago. Last eye exam: A year ago.  Hx of peripheral neuropathy, LE "deep pain."   Hypertension:   10+ years. Currently on Spironolactone 25 mg daily,Metoprolol Tartrate 25 mg bid,Imdur 30 mg ,and Amlodipine 5 mg daily. Hx of CAD, 3 vessel disease and due to age and surgical risk, conservative management was recommended. 2015 CVD with residual hemianopsia. She is on Plavix and Aspirin.  Former smoker.  OSA, she follows with pulmonologist, Dr Elsworth Soho.  She is taking medications as instructed, no side effects reported.  She has not noted headache, visual changes, exertional chest pain, dyspnea,  focal weakness, or edema.   Lab Results  Component Value Date   CREATININE 0.82 06/16/2016   BUN 16 06/16/2016   NA 136 06/16/2016   K 4.1 06/16/2016   CL 102 06/16/2016   CO2 22 06/16/2016    She needs referral to cardiologist.  Unstable gait, she had a fall last week,she was evaluated in the ER on 01/07/17. She was going to seat on her wheel chair, it was not locked and moved away, so she fell.   She landed on right side. Shoulder and right upper back pain have improved.   Review of Systems  Constitutional: Negative for activity change, appetite change, fatigue and fever.  HENT: Negative for mouth sores, nosebleeds,  sore throat and trouble swallowing.   Eyes: Negative for redness and visual disturbance.  Respiratory: Negative for cough, shortness of breath and wheezing.   Cardiovascular: Negative for chest pain, palpitations and leg swelling.  Gastrointestinal: Negative for abdominal pain, nausea and vomiting.       Negative for changes in bowel habits.  Endocrine: Negative for cold intolerance, heat intolerance, polydipsia, polyphagia and polyuria.  Genitourinary: Negative for decreased urine volume, difficulty urinating, dysuria and hematuria.  Musculoskeletal: Positive for arthralgias. Negative for joint swelling.  Skin: Negative for rash and wound.  Neurological: Negative for syncope, weakness and headaches.  Psychiatric/Behavioral: Negative for confusion. The patient is nervous/anxious.       Current Outpatient Medications on File Prior to Visit  Medication Sig Dispense Refill  . albuterol (PROVENTIL HFA;VENTOLIN HFA) 108 (90 Base) MCG/ACT inhaler Inhale 2 puffs into the lungs every 6 (six) hours as needed for wheezing or shortness of breath. 1 Inhaler 6  . aspirin 81 MG chewable tablet Chew 81 mg by mouth daily.    . Cholecalciferol (VITAMIN D) 2000 units CAPS Take 2,000 capsules by mouth daily.     . ferrous sulfate 325 (65 FE) MG tablet Take 325 mg by mouth daily.     . furosemide (LASIX) 40 MG tablet Take 40 mg by mouth daily as needed for edema.     . isosorbide mononitrate (IMDUR) 30 MG 24 hr tablet Take 30 mg by mouth every morning.    . lamoTRIgine (  LAMICTAL) 200 MG tablet Take 300 mg by mouth 2 (two) times daily.    . naproxen (NAPROSYN) 500 MG tablet Take 1 tablet (500 mg total) by mouth 2 (two) times daily with a meal. 30 tablet 2  . nitroGLYCERIN (NITROSTAT) 0.4 MG SL tablet Place 0.4 mg under the tongue every 5 (five) minutes as needed for chest pain.    . phenytoin (DILANTIN) 100 MG ER capsule Take 300 mg by mouth at bedtime.    . phenytoin (DILANTIN) 30 MG ER capsule Take 30 mg by  mouth at bedtime.    . potassium chloride (K-DUR,KLOR-CON) 10 MEQ tablet Take 10 mEq by mouth daily. Take on Tuesday and thursday    . Spacer/Aero Chamber Mouthpiece MISC 1 Device by Does not apply route daily as needed. 1 each 0   No current facility-administered medications on file prior to visit.      Past Medical History:  Diagnosis Date  . Allergy   . Arthritis   . Bipolar 1 disorder ([place])   . Blood transfusion without reported diagnosis   . Carotid artery stenosis    50-69% bilateral ICA stenoses by velocity criteria but no visible plaque and ICA/CCA ratio 1.72 on right and 1.38 on left  . Coronary artery disease   . Depression   . Diabetes mellitus without complication ([place])   . Hypertension   . Left adrenal mass ([facility])    2.5 cm L adrenal mass on CT per note of Dr. Seleta Rhymes 05/20/2014  . MI, old   . PAD (peripheral artery disease) ([place])    03/2014 - CT aortogram with lower extremity runoff (mild to moderate disease in common iliac arteries, complete occulusion of her bilateral external iliac arteries with heavily disease common femoral arteries. She also has disease to her SFAs bilaterally. Popliteal arteries and tibial vessel runoff appears to be adequate)  . Seizures ([place])   . Stroke [facility])    Allergies  Allergen Reactions  . Amoxicillin Other (See Comments)    colitis  . Augmentin [Amoxicillin-Pot Clavulanate] Other (See Comments)    colitis  . Cephalexin Other (See Comments)    colitis  . Erythromycin Other (See Comments)    colitis  . Niacin And Related Other (See Comments)    colitis  . Other Other (See Comments)    Family History  Problem Relation Age of Onset  . Hypertension Mother   . Arthritis Mother   . Diabetes Mother   . Early death Mother   . Hyperlipidemia Mother   . Stroke Mother   . Hypertension Father   . Alcohol abuse Father   . Early death Father   . Hyperlipidemia Father   . COPD Father   . Heart attack Father   . Hypertension Sister   .  Birth defects Brother   . Depression Brother   . Hyperlipidemia Brother   . Hypertension Brother   . Arthritis Daughter   . Asthma Daughter   . COPD Daughter   . Arthritis Son   . COPD Son   . Hypertension Son   . Hyperlipidemia Son   . Heart attack Son   . Arthritis Maternal Grandmother   . Cancer Maternal Grandmother     Social History   Socioeconomic History  . Marital status: Single    Spouse name: None  . Number of children: None  . Years of education: None  . Highest education level: None  Social Needs  . Financial resource strain: None  .  Food insecurity - worry: None  . Food insecurity - inability: None  . Transportation needs - medical: None  . Transportation needs - non-medical: None  Occupational History  . None  Tobacco Use  . Smoking status: Former Smoker    Packs/day: 1.00    Types: Cigarettes    Last attempt to quit: 2015    Years since quitting: 3.9  . Smokeless tobacco: Never Used  Substance and Sexual Activity  . Alcohol use: No  . Drug use: None  . Sexual activity: None  Other Topics Concern  . None  Social History Narrative  . None    Vitals:   01/10/17 1354  BP: 136/88  Pulse: 71  Temp: 97.8 F (36.6 C)  SpO2: 100%    Body mass index is 24.14 kg/m.   Physical Exam  Nursing note and vitals reviewed. Constitutional: She is oriented to person, place, and time. She appears well-developed and well-nourished. No distress.  HENT:  Head: Normocephalic and atraumatic.  Mouth/Throat: Oropharynx is clear and moist and mucous membranes are normal.  Eyes: Conjunctivae are normal. Pupils are equal, round, and reactive to light.  Neck: No tracheal deviation present. No thyroid mass and no thyromegaly present.  Cardiovascular: Normal rate and regular rhythm.  No murmur heard. Pulses:      Dorsalis pedis pulses are 2+ on the right side, and 2+ on the left side.  Respiratory: Effort normal and breath sounds normal. No respiratory distress.    GI: Soft. She exhibits no mass. There is no hepatomegaly. There is no tenderness.  Musculoskeletal: She exhibits no edema.  Lymphadenopathy:    She has no cervical adenopathy.  Neurological: She is alert and oriented to person, place, and time. She has normal strength. Gait abnormal. Coordination normal.  Skin: Skin is warm. No rash noted. No erythema.  Psychiatric: She has a normal mood and affect.  Well groomed, good eye contact.   Diabetic Foot Exam - Simple   Simple Foot Form Diabetic Foot exam was performed with the following findings:  Yes 01/10/2017  2:47 PM  Visual Inspection See comments:  Yes Sensation Testing See comments:  Yes Pulse Check See comments:  Yes Comments Diabetic foot exam:  Monofilament decreased bilateral. Peripheral pulses present (DP). No ulcers/excoriations  + calluses + hypertrophic/long toenails.        ASSESSMENT AND PLAN:   Ms. Pricsilla was seen today for establish care.  Diagnoses and all orders for this visit:  Diabetes mellitus type 2 with neurological manifestations ([place])  HgA1C done a couple of week ago, so no changes in current management. Regular exercise and healthy diet with avoidance of added sugar food intake is an important part of treatment and recommended. Annual eye exam, periodic dental and foot care recommended. F/U in 4 months  -     metFORMIN (GLUCOPHAGE) 500 MG tablet; Take 1 tablet (500 mg total) by mouth 2 (two) times daily with a meal.  Essential hypertension  Overall adequately controlled for her age. No changes in current management. DASH and low salt diet recommended. Eye exam recommended annually. F/U in 4 months, before if needed.  -     metoprolol tartrate (LOPRESSOR) 25 MG tablet; Take 1 tablet (25 mg total) by mouth 2 (two) times daily. -     spironolactone (ALDACTONE) 25 MG tablet; Take 1 tablet (25 mg total) by mouth daily. -     amLODipine (NORVASC) 10 MG tablet; Take 1 tablet (10 mg total) by  mouth  daily.  Coronary artery disease due to lipid rich plaque  Asymptomatic. No changes in current management: Statin,Plavix,Aspirin, and Metoprolol. Cardiology referral placed.  -     Ambulatory referral to Cardiology -     atorvastatin (LIPITOR) 80 MG tablet; Take 1 tablet (80 mg total) by mouth daily. -     clopidogrel (PLAVIX) 75 MG tablet; Take 1 tablet (75 mg total) by mouth daily.  Unstable gait  Fall precaution discussed. PT to be arranged through assisted living.   Hyperlipidemia associated with type 2 diabetes mellitus ([place])  Low fat diet recommended. No changes in statin dose. F/U in 6-12 months.  -     atorvastatin (LIPITOR) 80 MG tablet; Take 1 tablet (80 mg total) by mouth daily.    At the time of this visit I do not have copies of lab results. No labs recommended today, further recommendations will be given based on lab results. Her daughter will continue supervising her diet and will consider bringing food a few times per week.  She will continue following with pulmonologist for her COPD and OSA.     Kerrie Timm G. [place], MD  [facility]. [place] office.

## 2017-01-10 NOTE — Assessment & Plan Note (Signed)
Moderate restriction on PFT  No flare off Symbicort .

## 2017-01-10 NOTE — Patient Instructions (Signed)
Please Wear CPAP each night for at least 4-6 hrs .  New CPAP face mask has been ordered.  Follow up with Dr. Elsworth Soho  In 3 months and As needed

## 2017-01-10 NOTE — Assessment & Plan Note (Signed)
Contacted DME , to deliver face mask to help with comfort and compliance   Will need mask fitting if unable to wear   Plan  Patient Instructions  Please Wear CPAP each night for at least 4-6 hrs .  New CPAP face mask has been ordered.  Follow up with Dr. Elsworth Soho  In 3 months and As needed

## 2017-01-10 NOTE — Patient Instructions (Addendum)
A few things to remember from today's visit:   Diabetes mellitus type 2 with neurological manifestations [facility])  Essential hypertension  Coronary artery disease due to lipid rich plaque - Plan: Ambulatory referral to Cardiology  No changes today. PT to be arranged at assisted living.    Fall Prevention  Falls can cause injuries and can affect people from all age groups. There are many simple things that you can do to make your home safe and to help prevent falls. What can I do on the outside of my home?  Regularly repair the edges of walkways and driveways and fix any cracks.  Remove high doorway thresholds.  Trim any shrubbery on the main path into your home.  Use bright outdoor lighting.  Clear walkways of debris and clutter, including tools and rocks.  Regularly check that handrails are securely fastened and in good repair. Both sides of any steps should have handrails.  Install guardrails along the edges of any raised decks or porches.  Have leaves, snow, and ice cleared regularly.  Use sand or salt on walkways during winter months.  In the garage, clean up any spills right away, including grease or oil spills. What can I do in the bathroom?  Use night lights.  Install grab bars by the toilet and in the tub and shower. Do not use towel bars as grab bars.  Use non-skid mats or decals on the floor of the tub or shower.  If you need to sit down while you are in the shower, use a plastic, non-slip stool.  Keep the floor dry. Immediately clean up any water that spills on the floor.  Remove soap buildup in the tub or shower on a regular basis.  Attach bath mats securely with double-sided non-slip rug tape.  Remove throw rugs and other tripping hazards from the floor. What can I do in the bedroom?  Use night lights.  Make sure that a bedside light is easy to reach.  Do not use oversized bedding that drapes onto the floor.  Have a firm chair that has side arms  to use for getting dressed.  Remove throw rugs and other tripping hazards from the floor. What can I do in the kitchen?  Clean up any spills right away.  Avoid walking on wet floors.  Place frequently used items in easy-to-reach places.  If you need to reach for something above you, use a sturdy step stool that has a grab bar.  Keep electrical cables out of the way.  Do not use floor polish or wax that makes floors slippery. If you have to use wax, make sure that it is non-skid floor wax.  Remove throw rugs and other tripping hazards from the floor. What can I do in the stairways?  Do not leave any items on the stairs.  Make sure that there are handrails on both sides of the stairs. Fix handrails that are broken or loose. Make sure that handrails are as long as the stairways.  Check any carpeting to make sure that it is firmly attached to the stairs. Fix any carpet that is loose or worn.  Avoid having throw rugs at the top or bottom of stairways, or secure the rugs with carpet tape to prevent them from moving.  Make sure that you have a light switch at the top of the stairs and the bottom of the stairs. If you do not have them, have them installed. What are some other fall prevention tips?  Wear closed-toe shoes that fit well and support your feet. Wear shoes that have rubber soles or low heels.  When you use a stepladder, make sure that it is completely opened and that the sides are firmly locked. Have someone hold the ladder while you are using it. Do not climb a closed stepladder.  Add color or contrast paint or tape to grab bars and handrails in your home. Place contrasting color strips on the first and last steps.  Use mobility aids as needed, such as canes, walkers, scooters, and crutches.  Turn on lights if it is dark. Replace any light bulbs that burn out.  Set up furniture so that there are clear paths. Keep the furniture in the same spot.  Fix any uneven floor  surfaces.  Choose a carpet design that does not hide the edge of steps of a stairway.  Be aware of any and all pets.  Review your medicines with your healthcare provider. Some medicines can cause dizziness or changes in blood pressure, which increase your risk of falling. Talk with your health care provider about other ways that you can decrease your risk of falls. This may include working with a physical therapist or trainer to improve your strength, balance, and endurance. This information is not intended to replace advice given to you by your health care provider. Make sure you discuss any questions you have with your health care provider. Document Released: 01/01/2002 Document Revised: 06/10/2015 Document Reviewed: 02/15/2014 Elsevier Interactive Patient Education  2017 [place].   Please be sure medication list is accurate. If a new problem present, please set up appointment sooner than planned today.

## 2017-01-10 NOTE — Progress Notes (Signed)
@Patient  ID: Kathryn Lewis, female    DOB: 23-Jul-1945, 71 y.o.   MRN: [id number]  Chief Complaint  Patient presents with  . Follow-up    COPD     Referring provider: Sandi Mariscal, MD  HPI: 71 year old female former smoker followed for COPD and obstructive sleep apnea History of diabetes hypertension seizure disorder  Test Sleep study 2017 [facility] 13\hour  PFTs 2013  spirometry really does not show severe obstruction Per FVC is 76% of predicted Her FEV1 is 70% of predicted at 1.42 L  ratio  71% Her mid maximal flow rates are decreased at 50% with a mild improvement after bronchodilators  Lung volumes are all normal Total lung capacity 87% of predicted.  DLCO decreased at 55% of predicted  01/10/2017 Follow up : COPD and OSA  Patient returns for a 2-week follow-up.  Patient has known mild sleep apnea was supposed to have gotten a new CPAP mask last visit.  However her home care company has not sent this as of yet.  She continues to have trouble with her CPAP machine.  She has had a assisted living.  And someone has to help her put on her mass due to previous stroke.  Download shows poor compliance and usage.  Patient says the mask is very uncomfortable.Marland Kitchen  Spirometry last visit showed mild COPD with no significant airflow obstruction.  Patient was recommended to stop Symbicort.  And use albuterol as needed.  Patient says she is not seen any change in her breathing.  She denies any increased cough or wheezing.  Patient is accompanied by her daughter who helps with her medical care.  Allergies  Allergen Reactions  . Amoxicillin Other (See Comments)    colitis  . Augmentin [Amoxicillin-Pot Clavulanate] Other (See Comments)    colitis  . Cephalexin Other (See Comments)    colitis  . Erythromycin Other (See Comments)    colitis  . Niacin And Related Other (See Comments)    colitis  . Other Other (See Comments)     There is no immunization history on file for this  patient.  Past Medical History:  Diagnosis Date  . Allergy   . Arthritis   . Bipolar 1 disorder ([place])   . Blood transfusion without reported diagnosis   . Carotid artery stenosis    50-69% bilateral ICA stenoses by velocity criteria but no visible plaque and ICA/CCA ratio 1.72 on right and 1.38 on left  . Coronary artery disease   . Depression   . Diabetes mellitus without complication ([place])   . Hypertension   . Left adrenal mass (HCC)    2.5 cm L adrenal mass on CT per note of Dr. Seleta Rhymes 05/20/2014  . MI, old   . PAD (peripheral artery disease) ([place])    03/2014 - CT aortogram with lower extremity runoff (mild to moderate disease in common iliac arteries, complete occulusion of her bilateral external iliac arteries with heavily disease common femoral arteries. She also has disease to her SFAs bilaterally. Popliteal arteries and tibial vessel runoff appears to be adequate)  . Seizures ([place])   . Stroke [facility])     Tobacco History: Social History   Tobacco Use  Smoking Status Former Smoker  . Packs/day: 1.00  . Types: Cigarettes  . Last attempt to quit: 2015  . Years since quitting: 3.9  Smokeless Tobacco Never Used   Counseling given: Not Answered   Outpatient Encounter Medications as of 01/10/2017  Medication Sig  . albuterol (PROVENTIL  HFA;VENTOLIN HFA) 108 (90 Base) MCG/ACT inhaler Inhale 2 puffs into the lungs every 6 (six) hours as needed for wheezing or shortness of breath.  Marland Kitchen aspirin 81 MG chewable tablet Chew 81 mg by mouth daily.  . Cholecalciferol (VITAMIN D) 2000 units CAPS Take 2,000 capsules by mouth daily.   . ferrous sulfate 325 (65 FE) MG tablet Take 325 mg by mouth daily.   . furosemide (LASIX) 40 MG tablet Take 40 mg by mouth daily as needed for edema.   . isosorbide mononitrate (IMDUR) 30 MG 24 hr tablet Take 30 mg by mouth every morning.  . lamoTRIgine (LAMICTAL) 200 MG tablet Take 300 mg by mouth 2 (two) times daily.  . naproxen (NAPROSYN) 500 MG tablet  Take 1 tablet (500 mg total) by mouth 2 (two) times daily with a meal.  . nitroGLYCERIN (NITROSTAT) 0.4 MG SL tablet Place 0.4 mg under the tongue every 5 (five) minutes as needed for chest pain.  . phenytoin (DILANTIN) 100 MG ER capsule Take 300 mg by mouth at bedtime.  . phenytoin (DILANTIN) 30 MG ER capsule Take 30 mg by mouth at bedtime.  . potassium chloride (K-DUR,KLOR-CON) 10 MEQ tablet Take 10 mEq by mouth daily. Take on Tuesday and thursday  . Spacer/Aero Chamber Mouthpiece MISC 1 Device by Does not apply route daily as needed.  . [DISCONTINUED] albuterol (PROVENTIL HFA;VENTOLIN HFA) 108 (90 Base) MCG/ACT inhaler Inhale 2 puffs into the lungs every 6 (six) hours as needed for wheezing or shortness of breath.  . [DISCONTINUED] amLODipine (NORVASC) 10 MG tablet Take 1 tablet (10 mg total) by mouth daily.  . [DISCONTINUED] atorvastatin (LIPITOR) 80 MG tablet Take 80 mg by mouth daily.  . [DISCONTINUED] clopidogrel (PLAVIX) 75 MG tablet Take 75 mg by mouth daily.  . [DISCONTINUED] gabapentin (NEURONTIN) 100 MG capsule Take 100 mg by mouth 2 (two) times daily.   . [DISCONTINUED] metFORMIN (GLUCOPHAGE) 500 MG tablet Take 500 mg by mouth 2 (two) times daily with a meal.  . [DISCONTINUED] metoprolol tartrate (LOPRESSOR) 25 MG tablet Take 25 mg by mouth 2 (two) times daily.  . [DISCONTINUED] spironolactone (ALDACTONE) 25 MG tablet Take 1 tablet (25 mg total) by mouth daily.   No facility-administered encounter medications on file as of 01/10/2017.      Review of Systems  Constitutional:   No  weight loss, night sweats,  Fevers, chills +, fatigue, or  lassitude.  HEENT:   No headaches,  Difficulty swallowing,  Tooth/dental problems, or  Sore throat,                No sneezing, itching, ear ache, nasal congestion, post nasal drip,   CV:  No chest pain,  Orthopnea, PND, swelling in lower extremities, anasarca, dizziness, palpitations, syncope.   GI  No heartburn, indigestion, abdominal  pain, nausea, vomiting, diarrhea, change in bowel habits, loss of appetite, bloody stools.   Resp:    No chest wall deformity  Skin: no rash or lesions.  GU: no dysuria, change in color of urine, no urgency or frequency.  No flank pain, no hematuria   MS:  No joint pain or swelling.  No decreased range of motion.  No back pain.    Physical Exam  BP 126/64 (BP Location: Left Arm, Cuff Size: Normal)   Pulse 70   Ht 5\' 3"  (1.6 m)   Wt 134 lb (60.8 kg)   LMP 03/17/2015   SpO2 98%   BMI 23.74 kg/m   GEN:  A/Ox3; pleasant , NAD, frail and elderly in wc    HEENT:  [place]/AT,  EACs-clear, TMs-wnl, NOSE-clear, THROAT-clear, no lesions, no postnasal drip or exudate noted.   NECK:  Supple w/ fair ROM; no JVD; normal carotid impulses w/o bruits; no thyromegaly or nodules palpated; no lymphadenopathy.    RESP  Clear  P & A; w/o, wheezes/ rales/ or rhonchi. no accessory muscle use, no dullness to percussion  CARD:  RRR, no m/r/g, no peripheral edema, pulses intact, no cyanosis or clubbing.  GI:   Soft & nt; nml bowel sounds; no organomegaly or masses detected.   Musco: Warm bil, no deformities or joint swelling noted.   Neuro: alert, no focal deficits noted.    Skin: Warm, no lesions or rashes    Lab Results:  BNP No results found for: BNP  ProBNP No results found for: PROBNP  Imaging:    Assessment & Plan:   COPD (chronic obstructive pulmonary disease) (HCC) Moderate restriction on PFT  No flare off Symbicort .   OSA on CPAP Contacted DME , to deliver face mask to help with comfort and compliance   Will need mask fitting if unable to wear   Plan  Patient Instructions  Please Wear CPAP each night for at least 4-6 hrs .  New CPAP face mask has been ordered.  Follow up with Dr. Elsworth Soho  In 3 months and As needed          Rexene Edison, NP 01/10/2017

## 2017-01-12 DIAGNOSIS — J449 Chronic obstructive pulmonary disease, unspecified: Secondary | ICD-10-CM | POA: Diagnosis not present

## 2017-01-12 DIAGNOSIS — R279 Unspecified lack of coordination: Secondary | ICD-10-CM | POA: Diagnosis not present

## 2017-01-12 DIAGNOSIS — R296 Repeated falls: Secondary | ICD-10-CM | POA: Diagnosis not present

## 2017-01-12 DIAGNOSIS — I10 Essential (primary) hypertension: Secondary | ICD-10-CM | POA: Diagnosis not present

## 2017-01-12 DIAGNOSIS — E119 Type 2 diabetes mellitus without complications: Secondary | ICD-10-CM | POA: Diagnosis not present

## 2017-01-12 DIAGNOSIS — I509 Heart failure, unspecified: Secondary | ICD-10-CM | POA: Diagnosis not present

## 2017-01-12 DIAGNOSIS — R269 Unspecified abnormalities of gait and mobility: Secondary | ICD-10-CM | POA: Diagnosis not present

## 2017-01-12 DIAGNOSIS — F0390 Unspecified dementia without behavioral disturbance: Secondary | ICD-10-CM | POA: Diagnosis not present

## 2017-01-13 DIAGNOSIS — R269 Unspecified abnormalities of gait and mobility: Secondary | ICD-10-CM | POA: Diagnosis not present

## 2017-01-13 DIAGNOSIS — E119 Type 2 diabetes mellitus without complications: Secondary | ICD-10-CM | POA: Diagnosis not present

## 2017-01-13 DIAGNOSIS — R296 Repeated falls: Secondary | ICD-10-CM | POA: Diagnosis not present

## 2017-01-13 DIAGNOSIS — I509 Heart failure, unspecified: Secondary | ICD-10-CM | POA: Diagnosis not present

## 2017-01-13 DIAGNOSIS — I10 Essential (primary) hypertension: Secondary | ICD-10-CM | POA: Diagnosis not present

## 2017-01-13 DIAGNOSIS — R279 Unspecified lack of coordination: Secondary | ICD-10-CM | POA: Diagnosis not present

## 2017-01-19 DIAGNOSIS — R269 Unspecified abnormalities of gait and mobility: Secondary | ICD-10-CM | POA: Diagnosis not present

## 2017-01-19 DIAGNOSIS — I509 Heart failure, unspecified: Secondary | ICD-10-CM | POA: Diagnosis not present

## 2017-01-19 DIAGNOSIS — R279 Unspecified lack of coordination: Secondary | ICD-10-CM | POA: Diagnosis not present

## 2017-01-19 DIAGNOSIS — R296 Repeated falls: Secondary | ICD-10-CM | POA: Diagnosis not present

## 2017-01-19 DIAGNOSIS — I10 Essential (primary) hypertension: Secondary | ICD-10-CM | POA: Diagnosis not present

## 2017-01-19 DIAGNOSIS — E119 Type 2 diabetes mellitus without complications: Secondary | ICD-10-CM | POA: Diagnosis not present

## 2017-01-20 DIAGNOSIS — R296 Repeated falls: Secondary | ICD-10-CM | POA: Diagnosis not present

## 2017-01-20 DIAGNOSIS — I509 Heart failure, unspecified: Secondary | ICD-10-CM | POA: Diagnosis not present

## 2017-01-20 DIAGNOSIS — E119 Type 2 diabetes mellitus without complications: Secondary | ICD-10-CM | POA: Diagnosis not present

## 2017-01-20 DIAGNOSIS — R269 Unspecified abnormalities of gait and mobility: Secondary | ICD-10-CM | POA: Diagnosis not present

## 2017-01-20 DIAGNOSIS — I10 Essential (primary) hypertension: Secondary | ICD-10-CM | POA: Diagnosis not present

## 2017-01-20 DIAGNOSIS — R279 Unspecified lack of coordination: Secondary | ICD-10-CM | POA: Diagnosis not present

## 2017-01-21 DIAGNOSIS — I509 Heart failure, unspecified: Secondary | ICD-10-CM | POA: Diagnosis not present

## 2017-01-21 DIAGNOSIS — R296 Repeated falls: Secondary | ICD-10-CM | POA: Diagnosis not present

## 2017-01-21 DIAGNOSIS — R279 Unspecified lack of coordination: Secondary | ICD-10-CM | POA: Diagnosis not present

## 2017-01-21 DIAGNOSIS — E119 Type 2 diabetes mellitus without complications: Secondary | ICD-10-CM | POA: Diagnosis not present

## 2017-01-21 DIAGNOSIS — I10 Essential (primary) hypertension: Secondary | ICD-10-CM | POA: Diagnosis not present

## 2017-01-21 DIAGNOSIS — R269 Unspecified abnormalities of gait and mobility: Secondary | ICD-10-CM | POA: Diagnosis not present

## 2017-01-26 ENCOUNTER — Telehealth: Payer: Self-pay | Admitting: *Deleted

## 2017-01-26 NOTE — Progress Notes (Signed)
Chief Complaint  Patient presents with  . Thrush    Pt states that the she is having pain in her mouth/tongue x 1 week. Redness and white patches in mouth have improved with OTC mouth wash.     HPI: Kathryn Lewis 72 y.o.  SDA  pcp NA Feels liek bit tongue from c pap  But on going    Using otc med  Wash Using otc.  3 x per day   For 55 plus .  Helped some.   And much better but still hard to eat    No steroid inhaler recently  Dm not in best control foot at facility high carb choices  Remote hx of seizures .   see above  .    Hard to eat.  White film   On tongue no recenet antibiotic    No fever  ROS: See pertinent positives and negatives per HPI. No fever   Past Medical History:  Diagnosis Date  . Allergy   . Arthritis   . Bipolar 1 disorder ([place])   . Blood transfusion without reported diagnosis   . Carotid artery stenosis    50-69% bilateral ICA stenoses by velocity criteria but no visible plaque and ICA/CCA ratio 1.72 on right and 1.38 on left  . Coronary artery disease   . Depression   . Diabetes mellitus without complication ([place])   . Hypertension   . Left adrenal mass (HCC)    2.5 cm L adrenal mass on CT per note of Dr. Seleta Rhymes 05/20/2014  . MI, old   . PAD (peripheral artery disease) ([place])    03/2014 - CT aortogram with lower extremity runoff (mild to moderate disease in common iliac arteries, complete occulusion of her bilateral external iliac arteries with heavily disease common femoral arteries. She also has disease to her SFAs bilaterally. Popliteal arteries and tibial vessel runoff appears to be adequate)  . Seizures ([place])   . Stroke [facility])     Family History  Problem Relation Age of Onset  . Hypertension Mother   . Arthritis Mother   . Diabetes Mother   . Early death Mother   . Hyperlipidemia Mother   . Stroke Mother   . Hypertension Father   . Alcohol abuse Father   . Early death Father   . Hyperlipidemia Father   . COPD Father   . Heart attack Father     . Hypertension Sister   . Birth defects Brother   . Depression Brother   . Hyperlipidemia Brother   . Hypertension Brother   . Arthritis Daughter   . Asthma Daughter   . COPD Daughter   . Arthritis Son   . COPD Son   . Hypertension Son   . Hyperlipidemia Son   . Heart attack Son   . Arthritis Maternal Grandmother   . Cancer Maternal Grandmother     Social History   Socioeconomic History  . Marital status: Single    Spouse name: None  . Number of children: None  . Years of education: None  . Highest education level: None  Social Needs  . Financial resource strain: None  . Food insecurity - worry: None  . Food insecurity - inability: None  . Transportation needs - medical: None  . Transportation needs - non-medical: None  Occupational History  . None  Tobacco Use  . Smoking status: Former Smoker    Packs/day: 1.00    Types: Cigarettes    Last attempt  to quit: 2015    Years since quitting: 4.0  . Smokeless tobacco: Never Used  Substance and Sexual Activity  . Alcohol use: No  . Drug use: None  . Sexual activity: None  Other Topics Concern  . None  Social History Narrative  . None    Outpatient Medications Prior to Visit  Medication Sig Dispense Refill  . albuterol (PROVENTIL HFA;VENTOLIN HFA) 108 (90 Base) MCG/ACT inhaler Inhale 2 puffs into the lungs every 6 (six) hours as needed for wheezing or shortness of breath. 1 Inhaler 6  . amLODipine (NORVASC) 10 MG tablet Take 1 tablet (10 mg total) by mouth daily. 90 tablet 2  . aspirin 81 MG chewable tablet Chew 81 mg by mouth daily.    Marland Kitchen atorvastatin (LIPITOR) 80 MG tablet Take 1 tablet (80 mg total) by mouth daily. 90 tablet 3  . Cholecalciferol (VITAMIN D) 2000 units CAPS Take 2,000 capsules by mouth daily.     . clopidogrel (PLAVIX) 75 MG tablet Take 1 tablet (75 mg total) by mouth daily. 90 tablet 2  . ferrous sulfate 325 (65 FE) MG tablet Take 325 mg by mouth daily.     . isosorbide mononitrate (IMDUR) 30 MG  24 hr tablet Take 30 mg by mouth every morning.    . lamoTRIgine (LAMICTAL) 200 MG tablet Take 300 mg by mouth 2 (two) times daily.    . metFORMIN (GLUCOPHAGE) 500 MG tablet Take 1 tablet (500 mg total) by mouth 2 (two) times daily with a meal. 180 tablet 2  . metoprolol tartrate (LOPRESSOR) 25 MG tablet Take 1 tablet (25 mg total) by mouth 2 (two) times daily. 180 tablet 2  . naproxen (NAPROSYN) 500 MG tablet Take 1 tablet (500 mg total) by mouth 2 (two) times daily with a meal. 30 tablet 2  . nitroGLYCERIN (NITROSTAT) 0.4 MG SL tablet Place 0.4 mg under the tongue every 5 (five) minutes as needed for chest pain.    . phenytoin (DILANTIN) 100 MG ER capsule Take 300 mg by mouth at bedtime.    . phenytoin (DILANTIN) 30 MG ER capsule Take 30 mg by mouth at bedtime.    Marland Kitchen Spacer/Aero Chamber Mouthpiece MISC 1 Device by Does not apply route daily as needed. 1 each 0  . spironolactone (ALDACTONE) 25 MG tablet Take 1 tablet (25 mg total) by mouth daily. 30 tablet 0  . furosemide (LASIX) 40 MG tablet Take 40 mg by mouth daily as needed for edema.     . potassium chloride (K-DUR,KLOR-CON) 10 MEQ tablet Take 10 mEq by mouth daily. Take on Tuesday and thursday     No facility-administered medications prior to visit.      EXAM:  BP (!) 150/78 (BP Location: Right Arm, Patient Position: Sitting, Cuff Size: Normal)   Pulse 72   Temp 97.8 F (36.6 C) (Oral)   Wt 132 lb 6.4 oz (60.1 kg)   LMP 03/17/2015   BMI 23.45 kg/m   Body mass index is 23.45 kg/m.  GENERAL: vitals reviewed and listed above, alert, oriented, appears well hydrated and in no acute distress in [facility]  With  companion family  HEENT: atraumatic, conjunctiva  clear, no obvious abnormalities on inspection of external nose and ears OP :  Tongue mild denude patches  No white  Upper dentures   NECK: no obvious masses on inspection palpation  PSYCH: pleasant and cooperative, no obvious depression or anxiety  ASSESSMENT AND PLAN:  Discussed  the following assessment and  plan:  Tongue pain - most likely partly rx   thrush   disc rx expectant manageent .    Thrush  Diabetes mellitus type 2 with neurological manifestations [facility])  -Patient advised to return or notify health care team  if symptoms worsen ,persist or new concerns arise.  Patient Instructions  I agree this is probably partly treated thrush .   Take   Med as discussed .    Can take  7- 14 days .             Standley Brooking. Panosh M.D.

## 2017-01-26 NOTE — Telephone Encounter (Signed)
Patient and her daughter came into office c/o of sore painful tongue that is worsening for about a week. They were hoping to be seen same day, but we are unfortunately booked for the day as they arrived late in the day (4:45pm).  The patient is alert and well-appearing, in no acute distress. Her only complaint is her sore tongue.   Tongue is pink and moist with some fissures noted.     Scheduled acute appointment tomorrow w/ Dr. Regis Bill, as PCP is out of the office and patient and her daughter do not want to wait until Friday to be seen. They are agreeable to this plan and will return for appointment tomorrow.

## 2017-01-27 ENCOUNTER — Ambulatory Visit (INDEPENDENT_AMBULATORY_CARE_PROVIDER_SITE_OTHER): Payer: Medicare Other | Admitting: Internal Medicine

## 2017-01-27 ENCOUNTER — Encounter: Payer: Self-pay | Admitting: Internal Medicine

## 2017-01-27 VITALS — BP 150/78 | HR 72 | Temp 97.8°F | Wt 132.4 lb

## 2017-01-27 DIAGNOSIS — E1149 Type 2 diabetes mellitus with other diabetic neurological complication: Secondary | ICD-10-CM | POA: Diagnosis not present

## 2017-01-27 DIAGNOSIS — K146 Glossodynia: Secondary | ICD-10-CM

## 2017-01-27 DIAGNOSIS — B370 Candidal stomatitis: Secondary | ICD-10-CM | POA: Diagnosis not present

## 2017-01-27 MED ORDER — CLOTRIMAZOLE 10 MG MT TROC: OROMUCOSAL | 1 refills | Status: DC

## 2017-01-27 NOTE — Patient Instructions (Addendum)
I agree this is probably partly treated thrush .   Take   Med as discussed .    Can take  7- 14 days .

## 2017-01-28 DIAGNOSIS — R279 Unspecified lack of coordination: Secondary | ICD-10-CM | POA: Diagnosis not present

## 2017-01-28 DIAGNOSIS — I509 Heart failure, unspecified: Secondary | ICD-10-CM | POA: Diagnosis not present

## 2017-01-28 DIAGNOSIS — F0390 Unspecified dementia without behavioral disturbance: Secondary | ICD-10-CM | POA: Diagnosis not present

## 2017-01-28 DIAGNOSIS — J449 Chronic obstructive pulmonary disease, unspecified: Secondary | ICD-10-CM | POA: Diagnosis not present

## 2017-01-28 DIAGNOSIS — R296 Repeated falls: Secondary | ICD-10-CM | POA: Diagnosis not present

## 2017-01-28 DIAGNOSIS — E119 Type 2 diabetes mellitus without complications: Secondary | ICD-10-CM | POA: Diagnosis not present

## 2017-01-28 DIAGNOSIS — I10 Essential (primary) hypertension: Secondary | ICD-10-CM | POA: Diagnosis not present

## 2017-01-28 DIAGNOSIS — R269 Unspecified abnormalities of gait and mobility: Secondary | ICD-10-CM | POA: Diagnosis not present

## 2017-02-01 DIAGNOSIS — R279 Unspecified lack of coordination: Secondary | ICD-10-CM | POA: Diagnosis not present

## 2017-02-01 DIAGNOSIS — R296 Repeated falls: Secondary | ICD-10-CM | POA: Diagnosis not present

## 2017-02-01 DIAGNOSIS — I10 Essential (primary) hypertension: Secondary | ICD-10-CM | POA: Diagnosis not present

## 2017-02-01 DIAGNOSIS — I509 Heart failure, unspecified: Secondary | ICD-10-CM | POA: Diagnosis not present

## 2017-02-01 DIAGNOSIS — E119 Type 2 diabetes mellitus without complications: Secondary | ICD-10-CM | POA: Diagnosis not present

## 2017-02-01 DIAGNOSIS — R269 Unspecified abnormalities of gait and mobility: Secondary | ICD-10-CM | POA: Diagnosis not present

## 2017-02-02 ENCOUNTER — Ambulatory Visit (INDEPENDENT_AMBULATORY_CARE_PROVIDER_SITE_OTHER): Payer: Medicare Other | Admitting: Internal Medicine

## 2017-02-02 ENCOUNTER — Encounter: Payer: Self-pay | Admitting: Internal Medicine

## 2017-02-02 VITALS — BP 160/78 | HR 84 | Ht 63.0 in | Wt 136.4 lb

## 2017-02-02 DIAGNOSIS — R296 Repeated falls: Secondary | ICD-10-CM | POA: Diagnosis not present

## 2017-02-02 DIAGNOSIS — E785 Hyperlipidemia, unspecified: Secondary | ICD-10-CM | POA: Diagnosis not present

## 2017-02-02 DIAGNOSIS — E1169 Type 2 diabetes mellitus with other specified complication: Secondary | ICD-10-CM | POA: Diagnosis not present

## 2017-02-02 DIAGNOSIS — R279 Unspecified lack of coordination: Secondary | ICD-10-CM | POA: Diagnosis not present

## 2017-02-02 DIAGNOSIS — E119 Type 2 diabetes mellitus without complications: Secondary | ICD-10-CM | POA: Diagnosis not present

## 2017-02-02 DIAGNOSIS — I10 Essential (primary) hypertension: Secondary | ICD-10-CM

## 2017-02-02 DIAGNOSIS — I2510 Atherosclerotic heart disease of native coronary artery without angina pectoris: Secondary | ICD-10-CM | POA: Diagnosis not present

## 2017-02-02 DIAGNOSIS — I509 Heart failure, unspecified: Secondary | ICD-10-CM | POA: Diagnosis not present

## 2017-02-02 DIAGNOSIS — R269 Unspecified abnormalities of gait and mobility: Secondary | ICD-10-CM | POA: Diagnosis not present

## 2017-02-02 NOTE — Progress Notes (Signed)
Cardiology Office Note   Date:  02/02/2017   ID:  Ashwini, Jago 30-Dec-1945, MRN [id number]  PCP:  Martinique, Betty G, MD  Cardiologist:   Dorris Carnes, MD   Pt returns for f/u of CAD     History of Present Illness: [place] Mindie Rawdon is a 72 y.o. female with a history of  CAD and CVA   I saw her in 2017 while in hospital   CT scan showed PAD  She had arm pain at the time that I thought was atypcal for angina  The pt denies CP  Breathing was bad 1 month ago  OK now    Denies arm pain   Only complaint is bilateral leg pain from thigh down    Current Meds  Medication Sig  . albuterol (PROVENTIL HFA;VENTOLIN HFA) 108 (90 Base) MCG/ACT inhaler Inhale 2 puffs into the lungs every 6 (six) hours as needed for wheezing or shortness of breath.  Marland Kitchen amLODipine (NORVASC) 10 MG tablet Take 1 tablet (10 mg total) by mouth daily.  Marland Kitchen aspirin 81 MG chewable tablet Chew 81 mg by mouth daily.  Marland Kitchen atorvastatin (LIPITOR) 80 MG tablet Take 1 tablet (80 mg total) by mouth daily.  . Cholecalciferol (VITAMIN D) 2000 units CAPS Take 2,000 capsules by mouth daily.   . clopidogrel (PLAVIX) 75 MG tablet Take 1 tablet (75 mg total) by mouth daily.  . clotrimazole (MYCELEX) 10 MG troche Suck  And dissolve  In mouth 5 x per day  . ferrous sulfate 325 (65 FE) MG tablet Take 325 mg by mouth daily.   . isosorbide mononitrate (IMDUR) 30 MG 24 hr tablet Take 30 mg by mouth every morning.  . lamoTRIgine (LAMICTAL) 200 MG tablet Take 300 mg by mouth 2 (two) times daily.  . metFORMIN (GLUCOPHAGE) 500 MG tablet Take 1 tablet (500 mg total) by mouth 2 (two) times daily with a meal.  . metoprolol tartrate (LOPRESSOR) 25 MG tablet Take 1 tablet (25 mg total) by mouth 2 (two) times daily.  . naproxen (NAPROSYN) 500 MG tablet Take 1 tablet (500 mg total) by mouth 2 (two) times daily with a meal.  . nitroGLYCERIN (NITROSTAT) 0.4 MG SL tablet Place 0.4 mg under the tongue every 5 (five) minutes as needed for chest pain.  .  phenytoin (DILANTIN) 100 MG ER capsule Take 300 mg by mouth at bedtime.  . phenytoin (DILANTIN) 30 MG ER capsule Take 30 mg by mouth at bedtime.  Marland Kitchen Spacer/Aero Chamber Mouthpiece MISC 1 Device by Does not apply route daily as needed.  Marland Kitchen spironolactone (ALDACTONE) 25 MG tablet Take 1 tablet (25 mg total) by mouth daily.     Allergies:   Amoxicillin; Augmentin [amoxicillin-pot clavulanate]; Cephalexin; Erythromycin; Niacin and related; and Other   Past Medical History:  Diagnosis Date  . Allergy   . Arthritis   . Bipolar 1 disorder ([place])   . Blood transfusion without reported diagnosis   . Carotid artery stenosis    50-69% bilateral ICA stenoses by velocity criteria but no visible plaque and ICA/CCA ratio 1.72 on right and 1.38 on left  . Coronary artery disease   . Depression   . Diabetes mellitus without complication ([place])   . Hypertension   . Left adrenal mass (HCC)    2.5 cm L adrenal mass on CT per note of Dr. Seleta Rhymes 05/20/2014  . MI, old   . PAD (peripheral artery disease) ([place])    03/2014 - CT aortogram  with lower extremity runoff (mild to moderate disease in common iliac arteries, complete occulusion of her bilateral external iliac arteries with heavily disease common femoral arteries. She also has disease to her SFAs bilaterally. Popliteal arteries and tibial vessel runoff appears to be adequate)  . Seizures ([place])   . Stroke [facility])     Past Surgical History:  Procedure Laterality Date  . BREAST SURGERY  2012   small lump nonmilignant  . NECK SURGERY       Social History:  The patient  reports that she quit smoking about 4 years ago. Her smoking use included cigarettes. She smoked 1.00 pack per day. she has never used smokeless tobacco. She reports that she does not drink alcohol.   Family History:  The patient's family history includes Alcohol abuse in her father; Arthritis in her daughter, maternal grandmother, mother, and son; Asthma in her daughter; Birth defects in her  brother; COPD in her daughter, father, and son; Cancer in her maternal grandmother; Depression in her brother; Diabetes in her mother; Early death in her father and mother; Heart attack in her father and son; Hyperlipidemia in her brother, father, mother, and son; Hypertension in her brother, father, mother, sister, and son; Stroke in her mother.    ROS:  Please see the history of present illness. All other systems are reviewed and  Negative to the above problem except as noted.    PHYSICAL EXAM: VS:  BP (!) 160/78   Pulse 84   Ht 5\' 3"  (1.6 m)   Wt 136 lb 6.4 oz (61.9 kg)   LMP 03/17/2015   SpO2 99%   BMI 24.16 kg/m   GEN: Well nourished, well developed, in no acute distress Examined in wheel chair HEENT: normal  Neck: no JVD, carotid bruits, or masses Cardiac: RRR; no murmurs, rubs, or gallops,no edema  Respiratory:  clear to auscultation bilaterally, normal work of breathing GI: soft, nontender, nondistended, + BS  No hepatomegaly  MS: no deformity Moving all extremities   Skin: warm and dry, no rash Neuro:  Strength and sensation are intact Psych: euthymic mood, full affect   EKG:  EKG is not ordered today.   Lipid Panel No results found for: CHOL, TRIG, HDL, CHOLHDL, VLDL, LDLCALC, LDLDIRECT    Wt Readings from Last 3 Encounters:  02/02/17 136 lb 6.4 oz (61.9 kg)  01/27/17 132 lb 6.4 oz (60.1 kg)  01/10/17 136 lb 4 oz (61.8 kg)      ASSESSMENT AND PLAN:  1  CAD  No symptoms of angina    Continue meds  Increase for better BP control  2  HTN  BP is up  Will increase imdur to 60 mg   Low cholesterol, low Na diet  3  HL  Check lipds today   Will check CBC, BMET and lipid panel  F/U in 3 months      Current medicines are reviewed at length with the patient today.  The patient does not have concerns regarding medicines.  Signed, Dorris Carnes, MD  02/02/2017 3:46 PM    [address], [place], [place]  [address] Phone: [phone]; Fax: [phone]

## 2017-02-02 NOTE — Patient Instructions (Addendum)
Your physician has recommended you make the following change in your medication:  1.) increase IMDUR to 60 mg daily  Your physician recommends that you return for lab work today (LIPIDS, CBC, BMET)  Your physician recommends that you schedule a follow-up appointment in: 3 months with Dr. Harrington Challenger.  Please follow a low cholesterol/low sodium diet

## 2017-02-03 ENCOUNTER — Encounter: Payer: Self-pay | Admitting: Internal Medicine

## 2017-02-03 DIAGNOSIS — R296 Repeated falls: Secondary | ICD-10-CM | POA: Diagnosis not present

## 2017-02-03 DIAGNOSIS — R279 Unspecified lack of coordination: Secondary | ICD-10-CM | POA: Diagnosis not present

## 2017-02-03 DIAGNOSIS — R269 Unspecified abnormalities of gait and mobility: Secondary | ICD-10-CM | POA: Diagnosis not present

## 2017-02-03 DIAGNOSIS — E119 Type 2 diabetes mellitus without complications: Secondary | ICD-10-CM | POA: Diagnosis not present

## 2017-02-03 DIAGNOSIS — I10 Essential (primary) hypertension: Secondary | ICD-10-CM | POA: Diagnosis not present

## 2017-02-03 DIAGNOSIS — I509 Heart failure, unspecified: Secondary | ICD-10-CM | POA: Diagnosis not present

## 2017-02-03 LAB — BASIC METABOLIC PANEL
BUN/Creatinine Ratio: 16 (ref 12–28)
BUN: 11 mg/dL (ref 8–27)
CHLORIDE: 103 mmol/L (ref 96–106)
CO2: 22 mmol/L (ref 20–29)
Calcium: 9.9 mg/dL (ref 8.7–10.3)
Creatinine, Ser: 0.7 mg/dL (ref 0.57–1.00)
GFR, EST AFRICAN AMERICAN: 101 mL/min/{1.73_m2} (ref >59)
GFR, EST NON AFRICAN AMERICAN: 87 mL/min/{1.73_m2} (ref >59)
Glucose: 83 mg/dL (ref 65–99)
Potassium: 4.9 mmol/L (ref 3.5–5.2)
Sodium: 142 mmol/L (ref 134–144)

## 2017-02-03 LAB — LIPID PANEL
CHOL/HDL RATIO: 2.7 ratio (ref 0.0–4.4)
CHOLESTEROL TOTAL: 168 mg/dL (ref 100–199)
HDL: 63 mg/dL (ref >39)
LDL Calculated: 74 mg/dL (ref 0–99)
TRIGLYCERIDES: 156 mg/dL — AB (ref 0–149)
VLDL Cholesterol Cal: 31 mg/dL (ref 5–40)

## 2017-02-03 LAB — CBC
HEMATOCRIT: 30.9 % — AB (ref 34.0–46.6)
HEMOGLOBIN: 10.1 g/dL — AB (ref 11.1–15.9)
MCH: 29.4 pg (ref 26.6–33.0)
MCHC: 32.7 g/dL (ref 31.5–35.7)
MCV: 90 fL (ref 79–97)
Platelets: 307 10*3/uL (ref 150–379)
RBC: 3.43 x10E6/uL — ABNORMAL LOW (ref 3.77–5.28)
RDW: 16.3 % — ABNORMAL HIGH (ref 12.3–15.4)
WBC: 8 10*3/uL (ref 3.4–10.8)

## 2017-02-04 DIAGNOSIS — I10 Essential (primary) hypertension: Secondary | ICD-10-CM | POA: Diagnosis not present

## 2017-02-04 DIAGNOSIS — E119 Type 2 diabetes mellitus without complications: Secondary | ICD-10-CM | POA: Diagnosis not present

## 2017-02-04 DIAGNOSIS — R269 Unspecified abnormalities of gait and mobility: Secondary | ICD-10-CM | POA: Diagnosis not present

## 2017-02-04 DIAGNOSIS — I509 Heart failure, unspecified: Secondary | ICD-10-CM | POA: Diagnosis not present

## 2017-02-04 DIAGNOSIS — R279 Unspecified lack of coordination: Secondary | ICD-10-CM | POA: Diagnosis not present

## 2017-02-04 DIAGNOSIS — R296 Repeated falls: Secondary | ICD-10-CM | POA: Diagnosis not present

## 2017-02-07 DIAGNOSIS — R296 Repeated falls: Secondary | ICD-10-CM | POA: Diagnosis not present

## 2017-02-07 DIAGNOSIS — R279 Unspecified lack of coordination: Secondary | ICD-10-CM | POA: Diagnosis not present

## 2017-02-07 DIAGNOSIS — I10 Essential (primary) hypertension: Secondary | ICD-10-CM | POA: Diagnosis not present

## 2017-02-07 DIAGNOSIS — E119 Type 2 diabetes mellitus without complications: Secondary | ICD-10-CM | POA: Diagnosis not present

## 2017-02-07 DIAGNOSIS — I509 Heart failure, unspecified: Secondary | ICD-10-CM | POA: Diagnosis not present

## 2017-02-07 DIAGNOSIS — R269 Unspecified abnormalities of gait and mobility: Secondary | ICD-10-CM | POA: Diagnosis not present

## 2017-02-08 DIAGNOSIS — I10 Essential (primary) hypertension: Secondary | ICD-10-CM | POA: Diagnosis not present

## 2017-02-08 DIAGNOSIS — R296 Repeated falls: Secondary | ICD-10-CM | POA: Diagnosis not present

## 2017-02-08 DIAGNOSIS — R279 Unspecified lack of coordination: Secondary | ICD-10-CM | POA: Diagnosis not present

## 2017-02-08 DIAGNOSIS — R269 Unspecified abnormalities of gait and mobility: Secondary | ICD-10-CM | POA: Diagnosis not present

## 2017-02-08 DIAGNOSIS — I509 Heart failure, unspecified: Secondary | ICD-10-CM | POA: Diagnosis not present

## 2017-02-08 DIAGNOSIS — E119 Type 2 diabetes mellitus without complications: Secondary | ICD-10-CM | POA: Diagnosis not present

## 2017-02-09 ENCOUNTER — Telehealth: Payer: Self-pay | Admitting: Internal Medicine

## 2017-02-09 DIAGNOSIS — R279 Unspecified lack of coordination: Secondary | ICD-10-CM | POA: Diagnosis not present

## 2017-02-09 DIAGNOSIS — I509 Heart failure, unspecified: Secondary | ICD-10-CM | POA: Diagnosis not present

## 2017-02-09 DIAGNOSIS — E119 Type 2 diabetes mellitus without complications: Secondary | ICD-10-CM | POA: Diagnosis not present

## 2017-02-09 DIAGNOSIS — R269 Unspecified abnormalities of gait and mobility: Secondary | ICD-10-CM | POA: Diagnosis not present

## 2017-02-09 DIAGNOSIS — I10 Essential (primary) hypertension: Secondary | ICD-10-CM | POA: Diagnosis not present

## 2017-02-09 DIAGNOSIS — R296 Repeated falls: Secondary | ICD-10-CM | POA: Diagnosis not present

## 2017-02-09 NOTE — Telephone Encounter (Signed)
[employer] ) is calling about a fax for a diet for Mrs. Kathryn Lewis . She is asking that Dr. Harrington Challenger look it over and see if that is what she wants and sign and fax back to her .She is faxing it again today .  Please call if you have any questions   Thanks

## 2017-02-09 NOTE — Telephone Encounter (Signed)
Received fax from [facility]. It is a Diet Order Form attached to a copy of prescription from Dr. Harrington Challenger. The prescription from 02/02/17 states low cholesterol, low sodium diet.  Will place fax in Dr. Alan Ripper folder for review/completion and then to be faxed back to facility.

## 2017-02-10 DIAGNOSIS — I10 Essential (primary) hypertension: Secondary | ICD-10-CM | POA: Diagnosis not present

## 2017-02-10 DIAGNOSIS — R269 Unspecified abnormalities of gait and mobility: Secondary | ICD-10-CM | POA: Diagnosis not present

## 2017-02-10 DIAGNOSIS — R296 Repeated falls: Secondary | ICD-10-CM | POA: Diagnosis not present

## 2017-02-10 DIAGNOSIS — E119 Type 2 diabetes mellitus without complications: Secondary | ICD-10-CM | POA: Diagnosis not present

## 2017-02-10 DIAGNOSIS — I509 Heart failure, unspecified: Secondary | ICD-10-CM | POA: Diagnosis not present

## 2017-02-10 DIAGNOSIS — R279 Unspecified lack of coordination: Secondary | ICD-10-CM | POA: Diagnosis not present

## 2017-02-14 DIAGNOSIS — R269 Unspecified abnormalities of gait and mobility: Secondary | ICD-10-CM | POA: Diagnosis not present

## 2017-02-14 DIAGNOSIS — R296 Repeated falls: Secondary | ICD-10-CM | POA: Diagnosis not present

## 2017-02-14 DIAGNOSIS — I10 Essential (primary) hypertension: Secondary | ICD-10-CM | POA: Diagnosis not present

## 2017-02-14 DIAGNOSIS — E119 Type 2 diabetes mellitus without complications: Secondary | ICD-10-CM | POA: Diagnosis not present

## 2017-02-14 DIAGNOSIS — I509 Heart failure, unspecified: Secondary | ICD-10-CM | POA: Diagnosis not present

## 2017-02-14 DIAGNOSIS — R279 Unspecified lack of coordination: Secondary | ICD-10-CM | POA: Diagnosis not present

## 2017-02-15 DIAGNOSIS — I509 Heart failure, unspecified: Secondary | ICD-10-CM | POA: Diagnosis not present

## 2017-02-15 DIAGNOSIS — E119 Type 2 diabetes mellitus without complications: Secondary | ICD-10-CM | POA: Diagnosis not present

## 2017-02-15 DIAGNOSIS — R296 Repeated falls: Secondary | ICD-10-CM | POA: Diagnosis not present

## 2017-02-15 DIAGNOSIS — R269 Unspecified abnormalities of gait and mobility: Secondary | ICD-10-CM | POA: Diagnosis not present

## 2017-02-15 DIAGNOSIS — I10 Essential (primary) hypertension: Secondary | ICD-10-CM | POA: Diagnosis not present

## 2017-02-15 DIAGNOSIS — R279 Unspecified lack of coordination: Secondary | ICD-10-CM | POA: Diagnosis not present

## 2017-02-16 DIAGNOSIS — E119 Type 2 diabetes mellitus without complications: Secondary | ICD-10-CM | POA: Diagnosis not present

## 2017-02-16 DIAGNOSIS — I10 Essential (primary) hypertension: Secondary | ICD-10-CM | POA: Diagnosis not present

## 2017-02-16 DIAGNOSIS — R296 Repeated falls: Secondary | ICD-10-CM | POA: Diagnosis not present

## 2017-02-16 DIAGNOSIS — I509 Heart failure, unspecified: Secondary | ICD-10-CM | POA: Diagnosis not present

## 2017-02-16 DIAGNOSIS — R269 Unspecified abnormalities of gait and mobility: Secondary | ICD-10-CM | POA: Diagnosis not present

## 2017-02-16 DIAGNOSIS — R279 Unspecified lack of coordination: Secondary | ICD-10-CM | POA: Diagnosis not present

## 2017-02-17 DIAGNOSIS — R296 Repeated falls: Secondary | ICD-10-CM | POA: Diagnosis not present

## 2017-02-17 DIAGNOSIS — R269 Unspecified abnormalities of gait and mobility: Secondary | ICD-10-CM | POA: Diagnosis not present

## 2017-02-17 DIAGNOSIS — R279 Unspecified lack of coordination: Secondary | ICD-10-CM | POA: Diagnosis not present

## 2017-02-17 DIAGNOSIS — E119 Type 2 diabetes mellitus without complications: Secondary | ICD-10-CM | POA: Diagnosis not present

## 2017-02-17 DIAGNOSIS — I509 Heart failure, unspecified: Secondary | ICD-10-CM | POA: Diagnosis not present

## 2017-02-17 DIAGNOSIS — I10 Essential (primary) hypertension: Secondary | ICD-10-CM | POA: Diagnosis not present

## 2017-02-21 DIAGNOSIS — R296 Repeated falls: Secondary | ICD-10-CM | POA: Diagnosis not present

## 2017-02-21 DIAGNOSIS — I509 Heart failure, unspecified: Secondary | ICD-10-CM | POA: Diagnosis not present

## 2017-02-21 DIAGNOSIS — E119 Type 2 diabetes mellitus without complications: Secondary | ICD-10-CM | POA: Diagnosis not present

## 2017-02-21 DIAGNOSIS — R269 Unspecified abnormalities of gait and mobility: Secondary | ICD-10-CM | POA: Diagnosis not present

## 2017-02-21 DIAGNOSIS — R279 Unspecified lack of coordination: Secondary | ICD-10-CM | POA: Diagnosis not present

## 2017-02-21 DIAGNOSIS — I10 Essential (primary) hypertension: Secondary | ICD-10-CM | POA: Diagnosis not present

## 2017-02-22 ENCOUNTER — Telehealth: Payer: Self-pay | Admitting: *Deleted

## 2017-02-22 DIAGNOSIS — I10 Essential (primary) hypertension: Secondary | ICD-10-CM | POA: Diagnosis not present

## 2017-02-22 DIAGNOSIS — I509 Heart failure, unspecified: Secondary | ICD-10-CM | POA: Diagnosis not present

## 2017-02-22 DIAGNOSIS — R296 Repeated falls: Secondary | ICD-10-CM | POA: Diagnosis not present

## 2017-02-22 DIAGNOSIS — R269 Unspecified abnormalities of gait and mobility: Secondary | ICD-10-CM | POA: Diagnosis not present

## 2017-02-22 DIAGNOSIS — E119 Type 2 diabetes mellitus without complications: Secondary | ICD-10-CM | POA: Diagnosis not present

## 2017-02-22 DIAGNOSIS — R279 Unspecified lack of coordination: Secondary | ICD-10-CM | POA: Diagnosis not present

## 2017-02-22 DIAGNOSIS — E782 Mixed hyperlipidemia: Secondary | ICD-10-CM

## 2017-02-22 MED ORDER — EZETIMIBE 10 MG PO TABS: 10.0000 mg | ORAL_TABLET | Freq: Every day | ORAL | 3 refills | Status: DC

## 2017-02-22 NOTE — Telephone Encounter (Signed)
Notes recorded by Fay Records, MD on 02/04/2017 at 1:09 AM EST With vascular disease LDL needs to be lower Would add Zetia.  F/U lipids in 8 wks   Notes recorded by Rodman Key, RN on 02/22/2017 at 3:09 PM EST Spoke to patient and informed. She told me to fax information to [facility] so they can get the zetia. Called [facility]. Spoke to [place]. Faxed prescription for zetia as well as lab order for 04/26/17 to [phone]. Appointment made for repeat labwork. Pt aware to be fasting.

## 2017-02-23 DIAGNOSIS — I509 Heart failure, unspecified: Secondary | ICD-10-CM | POA: Diagnosis not present

## 2017-02-23 DIAGNOSIS — I10 Essential (primary) hypertension: Secondary | ICD-10-CM | POA: Diagnosis not present

## 2017-02-23 DIAGNOSIS — R269 Unspecified abnormalities of gait and mobility: Secondary | ICD-10-CM | POA: Diagnosis not present

## 2017-02-23 DIAGNOSIS — E119 Type 2 diabetes mellitus without complications: Secondary | ICD-10-CM | POA: Diagnosis not present

## 2017-02-23 DIAGNOSIS — R279 Unspecified lack of coordination: Secondary | ICD-10-CM | POA: Diagnosis not present

## 2017-02-23 DIAGNOSIS — R296 Repeated falls: Secondary | ICD-10-CM | POA: Diagnosis not present

## 2017-02-24 DIAGNOSIS — I10 Essential (primary) hypertension: Secondary | ICD-10-CM | POA: Diagnosis not present

## 2017-02-24 DIAGNOSIS — R296 Repeated falls: Secondary | ICD-10-CM | POA: Diagnosis not present

## 2017-02-24 DIAGNOSIS — E119 Type 2 diabetes mellitus without complications: Secondary | ICD-10-CM | POA: Diagnosis not present

## 2017-02-24 DIAGNOSIS — I509 Heart failure, unspecified: Secondary | ICD-10-CM | POA: Diagnosis not present

## 2017-02-24 DIAGNOSIS — R269 Unspecified abnormalities of gait and mobility: Secondary | ICD-10-CM | POA: Diagnosis not present

## 2017-02-24 DIAGNOSIS — R279 Unspecified lack of coordination: Secondary | ICD-10-CM | POA: Diagnosis not present

## 2017-03-01 DIAGNOSIS — E119 Type 2 diabetes mellitus without complications: Secondary | ICD-10-CM | POA: Diagnosis not present

## 2017-03-01 DIAGNOSIS — F0390 Unspecified dementia without behavioral disturbance: Secondary | ICD-10-CM | POA: Diagnosis not present

## 2017-03-01 DIAGNOSIS — J449 Chronic obstructive pulmonary disease, unspecified: Secondary | ICD-10-CM | POA: Diagnosis not present

## 2017-03-01 DIAGNOSIS — R269 Unspecified abnormalities of gait and mobility: Secondary | ICD-10-CM | POA: Diagnosis not present

## 2017-03-01 DIAGNOSIS — R296 Repeated falls: Secondary | ICD-10-CM | POA: Diagnosis not present

## 2017-03-01 DIAGNOSIS — R279 Unspecified lack of coordination: Secondary | ICD-10-CM | POA: Diagnosis not present

## 2017-03-01 DIAGNOSIS — I10 Essential (primary) hypertension: Secondary | ICD-10-CM | POA: Diagnosis not present

## 2017-03-01 DIAGNOSIS — I509 Heart failure, unspecified: Secondary | ICD-10-CM | POA: Diagnosis not present

## 2017-03-03 DIAGNOSIS — I10 Essential (primary) hypertension: Secondary | ICD-10-CM | POA: Diagnosis not present

## 2017-03-03 DIAGNOSIS — R269 Unspecified abnormalities of gait and mobility: Secondary | ICD-10-CM | POA: Diagnosis not present

## 2017-03-03 DIAGNOSIS — R296 Repeated falls: Secondary | ICD-10-CM | POA: Diagnosis not present

## 2017-03-03 DIAGNOSIS — R279 Unspecified lack of coordination: Secondary | ICD-10-CM | POA: Diagnosis not present

## 2017-03-03 DIAGNOSIS — E119 Type 2 diabetes mellitus without complications: Secondary | ICD-10-CM | POA: Diagnosis not present

## 2017-03-03 DIAGNOSIS — I509 Heart failure, unspecified: Secondary | ICD-10-CM | POA: Diagnosis not present

## 2017-03-07 DIAGNOSIS — R296 Repeated falls: Secondary | ICD-10-CM | POA: Diagnosis not present

## 2017-03-07 DIAGNOSIS — R269 Unspecified abnormalities of gait and mobility: Secondary | ICD-10-CM | POA: Diagnosis not present

## 2017-03-07 DIAGNOSIS — I509 Heart failure, unspecified: Secondary | ICD-10-CM | POA: Diagnosis not present

## 2017-03-07 DIAGNOSIS — I10 Essential (primary) hypertension: Secondary | ICD-10-CM | POA: Diagnosis not present

## 2017-03-07 DIAGNOSIS — E119 Type 2 diabetes mellitus without complications: Secondary | ICD-10-CM | POA: Diagnosis not present

## 2017-03-07 DIAGNOSIS — R279 Unspecified lack of coordination: Secondary | ICD-10-CM | POA: Diagnosis not present

## 2017-03-09 DIAGNOSIS — R296 Repeated falls: Secondary | ICD-10-CM | POA: Diagnosis not present

## 2017-03-09 DIAGNOSIS — R269 Unspecified abnormalities of gait and mobility: Secondary | ICD-10-CM | POA: Diagnosis not present

## 2017-03-09 DIAGNOSIS — E119 Type 2 diabetes mellitus without complications: Secondary | ICD-10-CM | POA: Diagnosis not present

## 2017-03-09 DIAGNOSIS — I509 Heart failure, unspecified: Secondary | ICD-10-CM | POA: Diagnosis not present

## 2017-03-09 DIAGNOSIS — I10 Essential (primary) hypertension: Secondary | ICD-10-CM | POA: Diagnosis not present

## 2017-03-09 DIAGNOSIS — R279 Unspecified lack of coordination: Secondary | ICD-10-CM | POA: Diagnosis not present

## 2017-03-10 DIAGNOSIS — I10 Essential (primary) hypertension: Secondary | ICD-10-CM | POA: Diagnosis not present

## 2017-03-10 DIAGNOSIS — R279 Unspecified lack of coordination: Secondary | ICD-10-CM | POA: Diagnosis not present

## 2017-03-10 DIAGNOSIS — E119 Type 2 diabetes mellitus without complications: Secondary | ICD-10-CM | POA: Diagnosis not present

## 2017-03-10 DIAGNOSIS — R296 Repeated falls: Secondary | ICD-10-CM | POA: Diagnosis not present

## 2017-03-10 DIAGNOSIS — R269 Unspecified abnormalities of gait and mobility: Secondary | ICD-10-CM | POA: Diagnosis not present

## 2017-03-10 DIAGNOSIS — I509 Heart failure, unspecified: Secondary | ICD-10-CM | POA: Diagnosis not present

## 2017-03-18 ENCOUNTER — Ambulatory Visit (INDEPENDENT_AMBULATORY_CARE_PROVIDER_SITE_OTHER): Payer: Medicare Other | Admitting: Neurology

## 2017-03-18 ENCOUNTER — Encounter: Payer: Self-pay | Admitting: Neurology

## 2017-03-18 VITALS — BP 186/86 | HR 81 | Ht 60.75 in | Wt 128.0 lb

## 2017-03-18 DIAGNOSIS — Z8673 Personal history of transient ischemic attack (TIA), and cerebral infarction without residual deficits: Secondary | ICD-10-CM | POA: Diagnosis not present

## 2017-03-18 DIAGNOSIS — G40209 Localization-related (focal) (partial) symptomatic epilepsy and epileptic syndromes with complex partial seizures, not intractable, without status epilepticus: Secondary | ICD-10-CM

## 2017-03-18 DIAGNOSIS — I2510 Atherosclerotic heart disease of native coronary artery without angina pectoris: Secondary | ICD-10-CM

## 2017-03-18 DIAGNOSIS — M5440 Lumbago with sciatica, unspecified side: Secondary | ICD-10-CM

## 2017-03-18 DIAGNOSIS — G629 Polyneuropathy, unspecified: Secondary | ICD-10-CM | POA: Diagnosis not present

## 2017-03-18 DIAGNOSIS — G8929 Other chronic pain: Secondary | ICD-10-CM

## 2017-03-18 NOTE — Patient Instructions (Addendum)
1. Refer to Ortho for back pain, interested in injections 2. Continue Lamictal and Dilantin 3. Continue aspirin, Plavix, control of blood pressure, cholesterol, diabetes 4. Follow-up in a year, call for any changes  Seizure Precautions: 1. If medication has been prescribed for you to prevent seizures, take it exactly as directed.  Do not stop taking the medicine without talking to your doctor first, even if you have not had a seizure in a long time.   2. Avoid activities in which a seizure would cause danger to yourself or to others.  Don't operate dangerous machinery, swim alone, or climb in high or dangerous places, such as on ladders, roofs, or girders.  Do not drive unless your doctor says you may.  3. If you have any warning that you may have a seizure, lay down in a safe place where you can't hurt yourself.    4.  No driving for 6 months from last seizure, as per [facility].   Please refer to the following link on the [facility] website for more information: http://www.epilepsyfoundation.org/answerplace/Social/driving/drivingu.cfm   5.  Maintain good sleep hygiene. Avoid alcohol.  6.  Contact your doctor if you have any problems that may be related to the medicine you are taking.  7.  Call 911 and bring the patient back to the ED if:        A.  The seizure lasts longer than 5 minutes.       B.  The patient doesn't awaken shortly after the seizure  C.  The patient has new problems such as difficulty seeing, speaking or moving  D.  The patient was injured during the seizure  E.  The patient has a temperature over 102 F (39C)  F.  The patient vomited and now is having trouble breathing

## 2017-03-18 NOTE — Progress Notes (Signed)
NEUROLOGY CONSULTATION NOTE  Kathryn Lewis MRN: [id number] DOB: 08/15/1945  Referring provider: Dr. Kara Mead Primary care provider: Dr. Betty Martinique  Reason for consult:  Establish care for seizures  Dear Dr Elsworth Soho:  Thank you for your kind referral of Kathryn Lewis for consultation of the above symptoms. Although her history is well known to you, please allow me to reiterate it for the purpose of our medical record. The patient was accompanied to the clinic by her daughter who also provides collateral information. Records and images were personally reviewed where available.  HISTORY OF PRESENT ILLNESS: This is a pleasant 72 year old ambidextrous right-hand dominant woman with a history of hypertension, diabetes, peripheral vascular disease, COPD, sleep apnea on CPAP, CAD, bipolar disorder, stroke in 2015 with subsequent seizures, presenting to establish care. She had previously been seeing neurologist Dr. Chipper Herb in [place], [place], records unavailable for review. She and her daughter report that she had a stroke in 2015 which mostly affected her vision and memory. She cannot read. She can write her name and sometimes a word, but something would write a different word. The first seizure occurred in 2016, she recalls that everything got loud and mixed up, she tried calling her son but per daughter was "hollering out loud" then became unconscious. They report possibly only 2 seizures at that time, no seizures since 2016. She is taking Lamotrigine 200mg  1.5 tabs BID and Dilantin 330mg  qhs with no side effects. She had previously been living with her daughter, but due to her daughter medical issues, the patient opted to move to [facility] last year. She has had memory issues since the stroke, her daughter manages bills. Medications are administered at [facility]. She does not drive. She reports bilateral leg pain and deep pain radiating to her lower back. She also has a diagnosis of  sciatica, "I think the left one." She has peripheral artery disease and sees Vascular.   She denies any olfactory/gustatory hallucinations, deja vu, rising epigastric sensation, focal numbness/tingling/weakness, myoclonic jerks. She denies any headaches, dizziness, diplopia, neck/back pain, bowel/bladder dysfunction.   Epilepsy Risk Factors:  History of large right posterior temporal and medial occipital infarct with encephalomalacia and gliosis. Otherwise she had a normal birth and early development.  There is no history of febrile convulsions, CNS infections such as meningitis/encephalitis, significant traumatic brain injury, neurosurgical procedures, or family history of seizures.   PAST MEDICAL HISTORY: Past Medical History:  Diagnosis Date  . Allergy   . Arthritis   . Bipolar 1 disorder ([place])   . Blood transfusion without reported diagnosis   . Carotid artery stenosis    50-69% bilateral ICA stenoses by velocity criteria but no visible plaque and ICA/CCA ratio 1.72 on right and 1.38 on left  . Coronary artery disease   . Depression   . Diabetes mellitus without complication ([place])   . Hypertension   . Left adrenal mass (HCC)    2.5 cm L adrenal mass on CT per note of Dr. Seleta Rhymes 05/20/2014  . MI, old   . PAD (peripheral artery disease) ([place])    03/2014 - CT aortogram with lower extremity runoff (mild to moderate disease in common iliac arteries, complete occulusion of her bilateral external iliac arteries with heavily disease common femoral arteries. She also has disease to her SFAs bilaterally. Popliteal arteries and tibial vessel runoff appears to be adequate)  . Seizures ([place])   . Stroke [facility])     PAST SURGICAL HISTORY:  Past Surgical History:  Procedure Laterality Date  . BREAST SURGERY  2012   small lump nonmilignant  . NECK SURGERY      MEDICATIONS: Current Outpatient Medications on File Prior to Visit  Medication Sig Dispense Refill  . albuterol (PROVENTIL HFA;VENTOLIN  HFA) 108 (90 Base) MCG/ACT inhaler Inhale 2 puffs into the lungs every 6 (six) hours as needed for wheezing or shortness of breath. 1 Inhaler 6  . amLODipine (NORVASC) 10 MG tablet Take 1 tablet (10 mg total) by mouth daily. 90 tablet 2  . aspirin 81 MG chewable tablet Chew 81 mg by mouth daily.    Marland Kitchen atorvastatin (LIPITOR) 80 MG tablet Take 1 tablet (80 mg total) by mouth daily. 90 tablet 3  . Cholecalciferol (VITAMIN D) 2000 units CAPS Take 2,000 capsules by mouth daily.     . clopidogrel (PLAVIX) 75 MG tablet Take 1 tablet (75 mg total) by mouth daily. 90 tablet 2  . clotrimazole (MYCELEX) 10 MG troche Suck  And dissolve  In mouth 5 x per day 35 tablet 1  . ezetimibe (ZETIA) 10 MG tablet Take 1 tablet (10 mg total) by mouth daily. 90 tablet 3  . ferrous sulfate 325 (65 FE) MG tablet Take 325 mg by mouth daily.     . isosorbide mononitrate (IMDUR) 30 MG 24 hr tablet Take 60 mg by mouth every morning.    . lamoTRIgine (LAMICTAL) 200 MG tablet Take 300 mg by mouth 2 (two) times daily.    . metFORMIN (GLUCOPHAGE) 500 MG tablet Take 1 tablet (500 mg total) by mouth 2 (two) times daily with a meal. 180 tablet 2  . metoprolol tartrate (LOPRESSOR) 25 MG tablet Take 1 tablet (25 mg total) by mouth 2 (two) times daily. 180 tablet 2  . naproxen (NAPROSYN) 500 MG tablet Take 1 tablet (500 mg total) by mouth 2 (two) times daily with a meal. 30 tablet 2  . nitroGLYCERIN (NITROSTAT) 0.4 MG SL tablet Place 0.4 mg under the tongue every 5 (five) minutes as needed for chest pain.    . phenytoin (DILANTIN) 100 MG ER capsule Take 300 mg by mouth at bedtime.    . phenytoin (DILANTIN) 30 MG ER capsule Take 30 mg by mouth at bedtime.    Marland Kitchen Spacer/Aero Chamber Mouthpiece MISC 1 Device by Does not apply route daily as needed. 1 each 0  . spironolactone (ALDACTONE) 25 MG tablet Take 1 tablet (25 mg total) by mouth daily. 30 tablet 0   No current facility-administered medications on file prior to visit.      ALLERGIES: Allergies  Allergen Reactions  . Amoxicillin Other (See Comments)    colitis  . Augmentin [Amoxicillin-Pot Clavulanate] Other (See Comments)    colitis  . Cephalexin Other (See Comments)    colitis  . Erythromycin Other (See Comments)    colitis  . Niacin And Related Other (See Comments)    colitis  . Other Other (See Comments)    FAMILY HISTORY: Family History  Problem Relation Age of Onset  . Hypertension Mother   . Arthritis Mother   . Diabetes Mother   . Early death Mother   . Hyperlipidemia Mother   . Stroke Mother   . Hypertension Father   . Alcohol abuse Father   . Early death Father   . Hyperlipidemia Father   . COPD Father   . Heart attack Father   . Hypertension Sister   . Birth defects Brother   . Depression  Brother   . Hyperlipidemia Brother   . Hypertension Brother   . Arthritis Daughter   . Asthma Daughter   . COPD Daughter   . Arthritis Son   . COPD Son   . Hypertension Son   . Hyperlipidemia Son   . Heart attack Son   . Arthritis Maternal Grandmother   . Cancer Maternal Grandmother     SOCIAL HISTORY: Social History   Socioeconomic History  . Marital status: Single    Spouse name: Not on file  . Number of children: Not on file  . Years of education: Not on file  . Highest education level: Not on file  Social Needs  . Financial resource strain: Not on file  . Food insecurity - worry: Not on file  . Food insecurity - inability: Not on file  . Transportation needs - medical: Not on file  . Transportation needs - non-medical: Not on file  Occupational History  . Not on file  Tobacco Use  . Smoking status: Former Smoker    Packs/day: 1.00    Types: Cigarettes    Last attempt to quit: 2015    Years since quitting: 4.1  . Smokeless tobacco: Never Used  Substance and Sexual Activity  . Alcohol use: No  . Drug use: No  . Sexual activity: Not on file  Other Topics Concern  . Not on file  Social History Narrative    Pt lives in assisted living facility   Has 2 adult children   College   Worked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    REVIEW OF SYSTEMS: Constitutional: No fevers, chills, or sweats, no generalized fatigue, change in appetite Eyes: No visual changes, double vision, eye pain Ear, nose and throat: No hearing loss, ear pain, nasal congestion, sore throat Cardiovascular: No chest pain, palpitations Respiratory:  No shortness of breath at rest or with exertion, wheezes GastrointestinaI: No nausea, vomiting, diarrhea, abdominal pain, fecal incontinence Genitourinary:  No dysuria, urinary retention or frequency Musculoskeletal:  No neck pain,+ back pain Integumentary: No rash, pruritus, skin lesions Neurological: as above Psychiatric: No depression, insomnia, anxiety Endocrine: No palpitations, fatigue, diaphoresis, mood swings, change in appetite, change in weight, increased thirst Hematologic/Lymphatic:  No anemia, purpura, petechiae. Allergic/Immunologic: no itchy/runny eyes, nasal congestion, recent allergic reactions, rashes  PHYSICAL EXAM: Vitals:   03/18/17 1409  BP: (!) 186/86  Pulse: 81  SpO2: 97%   General: No acute distress Head:  Normocephalic/atraumatic Eyes: Fundoscopic exam shows bilateral sharp discs, no vessel changes, exudates, or hemorrhages Neck: supple, no paraspinal tenderness, full range of motion Back: No paraspinal tenderness Heart: regular rate and Kathryn Lungs: Clear to auscultation bilaterally. Vascular: No carotid bruits. Skin/Extremities: No rash, no edema Neurological Exam: Mental status: alert and oriented to person, place, and time, no dysarthria, +mild expressive aphasia, difficulty naming, able to repeat phrases. Fund of knowledge is reduced. Recent and remote memory are impaired. 0/3 delayed recall.  Attention and concentration are normal, difficulty spelling WORLD backwards(DLOR).    Able to name objects and repeat phrases.  Cranial nerves: CN I: not  tested CN II: pupils equal, round and reactive to light, left homonymous hemianopia, fundi unremarkable. CN III, IV, VI:  full range of motion, no nystagmus, no ptosis CN V: facial sensation intact CN VII: upper and lower face symmetric CN VIII: hearing intact to finger rub CN IX, X: gag intact, uvula midline CN XI: sternocleidomastoid and trapezius muscles intact CN XII: tongue midline Bulk & Tone: normal, no  fasciculations. Motor: 5/5 throughout with no pronator drift. Sensation: intact to light touch, cold, pin, vibration and joint position sense.  No extinction to double simultaneous stimulation.  Romberg test negative Deep Tendon Reflexes: +2 throughout, no ankle clonus Plantar responses: downgoing bilaterally Cerebellar: no incoordination on finger to nose Gait: slow and cautious, no ataxia, difficulty with tandem walk Tremor: none  IMPRESSION: This is a pleasant 72 year old right-handed woman with a history of hypertension, diabetes, peripheral vascular disease, COPD, sleep apnea on CPAP, CAD, bipolar disorder, stroke in 2015 with subsequent seizures, presenting to establish care. She has been seizure-free since 2016 on Lamictal 200mg  1.5 tabs BID and Dilantin 330mg  qhs with no side effects, continue current medications. Continue secondary stroke prevention, she is on aspirin and Plavix. BP today is 186/86, discuss continued BP control, as well as vascular risk factors with PCP. She does not drive. They are requesting injections for back pain, this is not performed in our office, she will be referred to Ortho. She will follow-up in 1 year and knows to call for any changes.   Thank you for allowing me to participate in the care of this patient. Please do not hesitate to call for any questions or concerns.   Ellouise Newer, M.D.  CC: Dr. Martinique, Dr. Elsworth Soho

## 2017-03-22 ENCOUNTER — Telehealth: Payer: Self-pay | Admitting: Family Medicine

## 2017-03-22 NOTE — Telephone Encounter (Signed)
Copied from [place] [id number]. Topic: Quick Communication - Rx Refill/Question >> Mar 22, 2017  8:51 AM Carolyn Stare wrote: Medication   Pt is new to Dr Martinique and her daughter call to say that pt takes  phenytoin (DILANTIN) 30 MG ER capsule  Daughter is asking the RX to be faxed to [facility] [phone] this is were the pt lives and they handle her [place] [phone] cell [phone]

## 2017-03-22 NOTE — Telephone Encounter (Signed)
Last office visit: 01/27/17 PCP:  Dr. Martinique Pharmacy: Medications are managed at [facility]; daughter is requesting to fax the order to the above facility at [phone].  Any questions, call nurse, [name] @ [phone], or cell # [phone]

## 2017-03-22 NOTE — Telephone Encounter (Signed)
Copied from [employer]. Topic: General - Other >> Mar 22, 2017  8:43 AM Carolyn Stare wrote:  Pt is new to Dr Martinique and her daughter call to say that pt takes  phenytoin (DILANTIN) 30 MG ER capsule  Daughter is asking the RX to be faxed to [facility] [phone] this is were the pt lives and they handle her [place] [phone] cell [phone]

## 2017-03-23 NOTE — Telephone Encounter (Signed)
Request needs to be sent to her neurologist, Dr Delice Lesch.  Thanks, BJ

## 2017-03-24 NOTE — Telephone Encounter (Signed)
Daughter calling back and stating that PCP was ordering provider from [facility]. Requesting call back, [phone] [facility].

## 2017-03-25 ENCOUNTER — Other Ambulatory Visit: Payer: Self-pay | Admitting: Family Medicine

## 2017-03-25 ENCOUNTER — Telehealth: Payer: Self-pay | Admitting: Neurology

## 2017-03-25 MED ORDER — PHENYTOIN SODIUM EXTENDED 100 MG PO CAPS: 300.0000 mg | ORAL_CAPSULE | Freq: Every day | ORAL | 0 refills | Status: DC

## 2017-03-25 MED ORDER — PHENYTOIN SODIUM EXTENDED 30 MG PO CAPS: 30.0000 mg | ORAL_CAPSULE | Freq: Every day | ORAL | 0 refills | Status: DC

## 2017-03-25 NOTE — Telephone Encounter (Signed)
Spoke with Helene Kelp from [facility]. Rx faxed to [facility] and directions to f/u in 2-3 months and monitor blood pressure daily per Dr. Martinique.

## 2017-03-25 NOTE — Telephone Encounter (Signed)
Dr Delice Lesch will continue following annually.  Because she has not had medication in a few days , Rx for Dilantin will be printed and faxed.  F/U appt in 2-3 months. Monitor BP daily, it was elevated at Dr Aquino's office.  Thanks, BJ

## 2017-03-25 NOTE — Telephone Encounter (Signed)
It appears that Betty Martinique, NP has handled this and may be prescribing in the future.

## 2017-03-25 NOTE — Telephone Encounter (Signed)
Pt left a VM message saying she needs a refill of her seizure medication

## 2017-04-22 ENCOUNTER — Encounter: Payer: Self-pay | Admitting: Adult Health

## 2017-04-22 ENCOUNTER — Ambulatory Visit (INDEPENDENT_AMBULATORY_CARE_PROVIDER_SITE_OTHER): Payer: Medicare Other | Admitting: Adult Health

## 2017-04-22 VITALS — BP 174/90 | HR 75 | Temp 98.1°F

## 2017-04-22 DIAGNOSIS — J069 Acute upper respiratory infection, unspecified: Secondary | ICD-10-CM | POA: Diagnosis not present

## 2017-04-22 DIAGNOSIS — I2510 Atherosclerotic heart disease of native coronary artery without angina pectoris: Secondary | ICD-10-CM | POA: Diagnosis not present

## 2017-04-22 MED ORDER — FLUTICASONE PROPIONATE 50 MCG/ACT NA SUSP: 2.0000 | Freq: Every day | NASAL | 6 refills | Status: DC

## 2017-04-22 MED ORDER — DOXYCYCLINE HYCLATE 100 MG PO CAPS: 100.0000 mg | ORAL_CAPSULE | Freq: Two times a day (BID) | ORAL | 0 refills | Status: DC

## 2017-04-22 NOTE — Progress Notes (Signed)
Subjective:    Patient ID: Kathryn Lewis, female    DOB: Sep 08, 1945, 72 y.o.   MRN: [id number]  URI   This is a new problem. The current episode started 1 to 4 weeks ago (7-10 days ). There has been no fever. Associated symptoms include congestion, coughing, headaches, rhinorrhea and sinus pain. Pertinent negatives include no ear pain, nausea, plugged ear sensation, sore throat or wheezing. She has tried decongestant for the symptoms. The treatment provided mild relief.    Review of Systems  Constitutional: Positive for fatigue. Negative for activity change, chills and fever.  HENT: Positive for congestion, postnasal drip, rhinorrhea, sinus pressure and sinus pain. Negative for ear pain and sore throat.   Respiratory: Positive for cough and shortness of breath. Negative for chest tightness and wheezing.   Cardiovascular: Negative.   Gastrointestinal: Negative for nausea.  Neurological: Positive for headaches.   Past Medical History:  Diagnosis Date  . Allergy   . Arthritis   . Bipolar 1 disorder ([place])   . Blood transfusion without reported diagnosis   . Carotid artery stenosis    50-69% bilateral ICA stenoses by velocity criteria but no visible plaque and ICA/CCA ratio 1.72 on right and 1.38 on left  . Coronary artery disease   . Depression   . Diabetes mellitus without complication ([place])   . Hypertension   . Left adrenal mass (HCC)    2.5 cm L adrenal mass on CT per note of Dr. Seleta Rhymes 05/20/2014  . MI, old   . PAD (peripheral artery disease) ([place])    03/2014 - CT aortogram with lower extremity runoff (mild to moderate disease in common iliac arteries, complete occulusion of her bilateral external iliac arteries with heavily disease common femoral arteries. She also has disease to her SFAs bilaterally. Popliteal arteries and tibial vessel runoff appears to be adequate)  . Seizures ([place])   . Stroke [facility])     Social History   Socioeconomic History  . Marital status: Single   Spouse name: Not on file  . Number of children: Not on file  . Years of education: Not on file  . Highest education level: Not on file  Occupational History  . Not on file  Social Needs  . Financial resource strain: Not on file  . Food insecurity:    Worry: Not on file    Inability: Not on file  . Transportation needs:    Medical: Not on file    Non-medical: Not on file  Tobacco Use  . Smoking status: Former Smoker    Packs/day: 1.00    Types: Cigarettes    Last attempt to quit: 2015    Years since quitting: 4.2  . Smokeless tobacco: Never Used  Substance and Sexual Activity  . Alcohol use: No  . Drug use: No  . Sexual activity: Not on file  Lifestyle  . Physical activity:    Days per week: Not on file    Minutes per session: Not on file  . Stress: Not on file  Relationships  . Social connections:    Talks on phone: Not on file    Gets together: Not on file    Attends religious service: Not on file    Active member of club or organization: Not on file    Attends meetings of clubs or organizations: Not on file    Relationship status: Not on file  . Intimate partner violence:    Fear of current or ex partner:  Not on file    Emotionally abused: Not on file    Physically abused: Not on file    Forced sexual activity: Not on file  Other Topics Concern  . Not on file  Social History Narrative   Pt lives in assisted living facility   Has 2 adult children   College   Worked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    Past Surgical History:  Procedure Laterality Date  . BREAST SURGERY  2012   small lump nonmilignant  . NECK SURGERY      Family History  Problem Relation Age of Onset  . Hypertension Mother   . Arthritis Mother   . Diabetes Mother   . Early death Mother   . Hyperlipidemia Mother   . Stroke Mother   . Hypertension Father   . Alcohol abuse Father   . Early death Father   . Hyperlipidemia Father   . COPD Father   . Heart attack Father   . Hypertension Sister     . Birth defects Brother   . Depression Brother   . Hyperlipidemia Brother   . Hypertension Brother   . Arthritis Daughter   . Asthma Daughter   . COPD Daughter   . Arthritis Son   . COPD Son   . Hypertension Son   . Hyperlipidemia Son   . Heart attack Son   . Arthritis Maternal Grandmother   . Cancer Maternal Grandmother     Allergies  Allergen Reactions  . Amoxicillin Other (See Comments)    colitis  . Augmentin [Amoxicillin-Pot Clavulanate] Other (See Comments)    colitis  . Cephalexin Other (See Comments)    colitis  . Erythromycin Other (See Comments)    colitis  . Niacin And Related Other (See Comments)    colitis  . Other Other (See Comments)    Current Outpatient Medications on File Prior to Visit  Medication Sig Dispense Refill  . albuterol (PROVENTIL HFA;VENTOLIN HFA) 108 (90 Base) MCG/ACT inhaler Inhale 2 puffs into the lungs every 6 (six) hours as needed for wheezing or shortness of breath. 1 Inhaler 6  . amLODipine (NORVASC) 10 MG tablet Take 1 tablet (10 mg total) by mouth daily. 90 tablet 2  . aspirin 81 MG chewable tablet Chew 81 mg by mouth daily.    Marland Kitchen atorvastatin (LIPITOR) 80 MG tablet Take 1 tablet (80 mg total) by mouth daily. 90 tablet 3  . Cholecalciferol (VITAMIN D) 2000 units CAPS Take 2,000 capsules by mouth daily.     . clopidogrel (PLAVIX) 75 MG tablet Take 1 tablet (75 mg total) by mouth daily. 90 tablet 2  . ezetimibe (ZETIA) 10 MG tablet Take 1 tablet (10 mg total) by mouth daily. 90 tablet 3  . ferrous sulfate 325 (65 FE) MG tablet Take 325 mg by mouth daily.     . isosorbide mononitrate (IMDUR) 30 MG 24 hr tablet Take 60 mg by mouth every morning.    . lamoTRIgine (LAMICTAL) 200 MG tablet Take 300 mg by mouth 2 (two) times daily.    . metFORMIN (GLUCOPHAGE) 500 MG tablet Take 1 tablet (500 mg total) by mouth 2 (two) times daily with a meal. 180 tablet 2  . metoprolol tartrate (LOPRESSOR) 25 MG tablet Take 1 tablet (25 mg total) by mouth 2  (two) times daily. 180 tablet 2  . naproxen (NAPROSYN) 500 MG tablet Take 1 tablet (500 mg total) by mouth 2 (two) times daily with a meal. 30 tablet  2  . nitroGLYCERIN (NITROSTAT) 0.4 MG SL tablet Place 0.4 mg under the tongue every 5 (five) minutes as needed for chest pain.    . phenytoin (DILANTIN) 100 MG ER capsule Take 3 capsules (300 mg total) by mouth at bedtime. 270 capsule 0  . phenytoin (DILANTIN) 30 MG ER capsule Take 1 capsule (30 mg total) by mouth at bedtime. 90 capsule 0  . Spacer/Aero Chamber Mouthpiece MISC 1 Device by Does not apply route daily as needed. 1 each 0  . spironolactone (ALDACTONE) 25 MG tablet Take 1 tablet (25 mg total) by mouth daily. 30 tablet 0  . clotrimazole (MYCELEX) 10 MG troche Suck  And dissolve  In mouth 5 x per day (Patient not taking: Reported on 04/22/2017) 35 tablet 1   No current facility-administered medications on file prior to visit.     BP (!) 174/90 (BP Location: Left Arm)   Pulse 75   Temp 98.1 F (36.7 C) (Oral)   LMP 03/17/2015   SpO2 98%       Objective:   Physical Exam  Constitutional: She is oriented to person, place, and time. She appears well-developed and well-nourished. No distress.  HENT:  Right Ear: Hearing, tympanic membrane, external ear and ear canal normal.  Left Ear: Hearing, tympanic membrane, external ear and ear canal normal.  Nose: Mucosal edema and rhinorrhea present. Right sinus exhibits maxillary sinus tenderness and frontal sinus tenderness. Left sinus exhibits maxillary sinus tenderness and frontal sinus tenderness.  Mouth/Throat: Uvula is midline, oropharynx is clear and moist and mucous membranes are normal.  Eyes: Pupils are equal, round, and reactive to light. Conjunctivae and EOM are normal. Right eye exhibits no discharge. Left eye exhibits no discharge. No scleral icterus.  Neck: Normal range of motion. Neck supple. No thyromegaly present.  Cardiovascular: Normal rate, regular rhythm, normal heart  sounds and intact distal pulses. Exam reveals no gallop and no friction rub.  No murmur heard. Pulmonary/Chest: Effort normal and breath sounds normal. No respiratory distress. She has no wheezes. She has no rales. She exhibits no tenderness.  Lymphadenopathy:    She has no cervical adenopathy.  Neurological: She is alert and oriented to person, place, and time.  Skin: Skin is warm and dry. No rash noted. She is not diaphoretic. No erythema. No pallor.  Psychiatric: She has a normal mood and affect. Her behavior is normal. Thought content normal.  Nursing note and vitals reviewed.     Assessment & Plan:  1. Upper respiratory tract infection, unspecified type  - doxycycline (VIBRAMYCIN) 100 MG capsule; Take 1 capsule (100 mg total) by mouth 2 (two) times daily.  Dispense: 14 capsule; Refill: 0 - fluticasone (FLONASE) 50 MCG/ACT nasal spray; Place 2 sprays into both nostrils daily.  Dispense: 16 g; Refill: 6 - stay hydrated and rest  - Follow up in 2-3 days if no improvement   Dorothyann Peng, NP

## 2017-04-26 ENCOUNTER — Other Ambulatory Visit: Payer: Medicare Other

## 2017-04-28 ENCOUNTER — Ambulatory Visit (INDEPENDENT_AMBULATORY_CARE_PROVIDER_SITE_OTHER): Payer: Medicare Other | Admitting: Internal Medicine

## 2017-04-28 ENCOUNTER — Encounter: Payer: Self-pay | Admitting: Internal Medicine

## 2017-04-28 VITALS — BP 128/80 | HR 64 | Ht 60.0 in | Wt 133.0 lb

## 2017-04-28 DIAGNOSIS — I2510 Atherosclerotic heart disease of native coronary artery without angina pectoris: Secondary | ICD-10-CM

## 2017-04-28 DIAGNOSIS — I639 Cerebral infarction, unspecified: Secondary | ICD-10-CM | POA: Diagnosis not present

## 2017-04-28 DIAGNOSIS — E785 Hyperlipidemia, unspecified: Secondary | ICD-10-CM

## 2017-04-28 DIAGNOSIS — I10 Essential (primary) hypertension: Secondary | ICD-10-CM

## 2017-04-28 DIAGNOSIS — E1169 Type 2 diabetes mellitus with other specified complication: Secondary | ICD-10-CM

## 2017-04-28 LAB — LIPID PANEL
CHOL/HDL RATIO: 2.8 ratio (ref 0.0–4.4)
Cholesterol, Total: 154 mg/dL (ref 100–199)
HDL: 55 mg/dL (ref >39)
LDL Calculated: 74 mg/dL (ref 0–99)
TRIGLYCERIDES: 126 mg/dL (ref 0–149)
VLDL Cholesterol Cal: 25 mg/dL (ref 5–40)

## 2017-04-28 LAB — CBC
HEMOGLOBIN: 11.2 g/dL (ref 11.1–15.9)
Hematocrit: 32.6 % — ABNORMAL LOW (ref 34.0–46.6)
MCH: 28.7 pg (ref 26.6–33.0)
MCHC: 34.4 g/dL (ref 31.5–35.7)
MCV: 84 fL (ref 79–97)
PLATELETS: 315 10*3/uL (ref 150–379)
RBC: 3.9 x10E6/uL (ref 3.77–5.28)
RDW: 14.9 % (ref 12.3–15.4)
WBC: 8.9 10*3/uL (ref 3.4–10.8)

## 2017-04-28 LAB — BASIC METABOLIC PANEL
BUN/Creatinine Ratio: 23 (ref 12–28)
BUN: 16 mg/dL (ref 8–27)
CO2: 23 mmol/L (ref 20–29)
Calcium: 10.2 mg/dL (ref 8.7–10.3)
Chloride: 104 mmol/L (ref 96–106)
Creatinine, Ser: 0.69 mg/dL (ref 0.57–1.00)
GFR calc Af Amer: 101 mL/min/{1.73_m2} (ref >59)
GFR calc non Af Amer: 88 mL/min/{1.73_m2} (ref >59)
Glucose: 115 mg/dL — ABNORMAL HIGH (ref 65–99)
Potassium: 4.4 mmol/L (ref 3.5–5.2)
SODIUM: 142 mmol/L (ref 134–144)

## 2017-04-28 NOTE — Patient Instructions (Signed)
Your physician recommends that you continue on your current medications as directed. Please refer to the Current Medication list given to you today. Your physician recommends that you return for lab work today (cbc, lipids, bmet) Your physician wants you to follow-up in: 6 months with Dr. Harrington Challenger.  You will receive a reminder letter in the mail two months in advance. If you don't receive a letter, please call our office to schedule the follow-up appointment.

## 2017-04-28 NOTE — Progress Notes (Signed)
Cardiology Office Note   Date:  04/28/2017   ID:  Kathryn Lewis, Kathryn Lewis Dec 17, 1945, MRN [id number]  PCP:  [place], Betty G, MD  Cardiologist:   Dorris Carnes, MD   Pt returns for f/u of CAD     History of Present Illness: Kathryn Lewis is a 72 y.o. female with a history of CAD and CVA and PAD      I saw him in Jan   At that time I increased imdur as BP was up  Currently, she denies CP  Breathing is stable    Less dizzy    BP gets checked 1x per month at [facility]  Current Meds  Medication Sig  . albuterol (PROVENTIL HFA;VENTOLIN HFA) 108 (90 Base) MCG/ACT inhaler Inhale 2 puffs into the lungs every 6 (six) hours as needed for wheezing or shortness of breath.  Marland Kitchen amLODipine (NORVASC) 10 MG tablet Take 1 tablet (10 mg total) by mouth daily.  Marland Kitchen aspirin 81 MG chewable tablet Chew 81 mg by mouth daily.  Marland Kitchen atorvastatin (LIPITOR) 80 MG tablet Take 1 tablet (80 mg total) by mouth daily.  . Cholecalciferol (VITAMIN D) 2000 units CAPS Take 2,000 capsules by mouth daily.   . clopidogrel (PLAVIX) 75 MG tablet Take 1 tablet (75 mg total) by mouth daily.  Marland Kitchen ezetimibe (ZETIA) 10 MG tablet Take 1 tablet (10 mg total) by mouth daily.  . ferrous sulfate 325 (65 FE) MG tablet Take 325 mg by mouth daily.   . fluticasone (FLONASE) 50 MCG/ACT nasal spray Place 2 sprays into both nostrils daily.  . isosorbide mononitrate (IMDUR) 30 MG 24 hr tablet Take 60 mg by mouth every morning.  . lamoTRIgine (LAMICTAL) 200 MG tablet Take 300 mg by mouth 2 (two) times daily.  . metFORMIN (GLUCOPHAGE) 500 MG tablet Take 1 tablet (500 mg total) by mouth 2 (two) times daily with a meal.  . metoprolol tartrate (LOPRESSOR) 25 MG tablet Take 1 tablet (25 mg total) by mouth 2 (two) times daily.  . naproxen (NAPROSYN) 500 MG tablet Take 1 tablet (500 mg total) by mouth 2 (two) times daily with a meal.  . nitroGLYCERIN (NITROSTAT) 0.4 MG SL tablet Place 0.4 mg under the tongue every 5 (five) minutes as needed for chest  pain.  . phenytoin (DILANTIN) 100 MG ER capsule Take 3 capsules (300 mg total) by mouth at bedtime.  . phenytoin (DILANTIN) 30 MG ER capsule Take 1 capsule (30 mg total) by mouth at bedtime.  Marland Kitchen Spacer/Aero Chamber Mouthpiece MISC 1 Device by Does not apply route daily as needed.  Marland Kitchen spironolactone (ALDACTONE) 25 MG tablet Take 1 tablet (25 mg total) by mouth daily.     Allergies:   Amoxicillin; Augmentin [amoxicillin-pot clavulanate]; Cephalexin; Erythromycin; Niacin and related; and Other   Past Medical History:  Diagnosis Date  . Allergy   . Arthritis   . Bipolar 1 disorder ([place])   . Blood transfusion without reported diagnosis   . Carotid artery stenosis    50-69% bilateral ICA stenoses by velocity criteria but no visible plaque and ICA/CCA ratio 1.72 on right and 1.38 on left  . Coronary artery disease   . Depression   . Diabetes mellitus without complication ([place])   . Hypertension   . Left adrenal mass (HCC)    2.5 cm L adrenal mass on CT per note of Dr. Seleta Rhymes 05/20/2014  . MI, old   . PAD (peripheral artery disease) ([place])    03/2014 -  CT aortogram with lower extremity runoff (mild to moderate disease in common iliac arteries, complete occulusion of her bilateral external iliac arteries with heavily disease common femoral arteries. She also has disease to her SFAs bilaterally. Popliteal arteries and tibial vessel runoff appears to be adequate)  . Seizures ([place])   . Stroke [facility])     Past Surgical History:  Procedure Laterality Date  . BREAST SURGERY  2012   small lump nonmilignant  . NECK SURGERY       Social History:  The patient  reports that she quit smoking about 4 years ago. Her smoking use included cigarettes. She smoked 1.00 pack per day. She has never used smokeless tobacco. She reports that she does not drink alcohol or use drugs.   Family History:  The patient's family history includes Alcohol abuse in her father; Arthritis in her daughter, maternal grandmother,  mother, and son; Asthma in her daughter; Birth defects in her brother; COPD in her daughter, father, and son; Cancer in her maternal grandmother; Depression in her brother; Diabetes in her mother; Early death in her father and mother; Heart attack in her father and son; Hyperlipidemia in her brother, father, mother, and son; Hypertension in her brother, father, mother, sister, and son; Stroke in her mother.    ROS:  Please see the history of present illness. All other systems are reviewed and  Negative to the above problem except as noted.    PHYSICAL EXAM: VS:  BP 128/80   Pulse 64   Ht 5' (1.524 m)   Wt 133 lb (60.3 kg)   LMP 03/17/2015   BMI 25.97 kg/m   GEN: Well nourished, well developed, in no acute distress Examined in wheel chair HEENT: normal  Neck: no JVD, carotid bruits, or masses Cardiac: RRR; no murmurs, rubs, or gallops,no edema  Respiratory:  clear to auscultation bilaterally, normal work of breathing GI: soft, nontender, nondistended, + BS  No hepatomegaly  MS: no deformity Moving all extremities   Skin: warm and dry, no rash Neuro:  Strength and sensation are intact Psych: euthymic mood, full affect   EKG:  EKG is not ordered today.   Lipid Panel    Component Value Date/Time   CHOL 168 02/02/2017 1612   TRIG 156 (H) 02/02/2017 1612   HDL 63 02/02/2017 1612   CHOLHDL 2.7 02/02/2017 1612   LDLCALC 74 02/02/2017 1612      Wt Readings from Last 3 Encounters:  04/28/17 133 lb (60.3 kg)  03/18/17 128 lb (58.1 kg)  02/02/17 136 lb 6.4 oz (61.9 kg)      ASSESSMENT AND PLAN:  1  CAD  No symptoms of angina    2  HTN   BP is OK today  On review of medicines, it does not appear that Imdur was actually increased   Still on old dose   Since BP is OK  I would keep on same regimen   Follow      3  HL  LDL in Jan was 74  Recomm adding zetia to get tighter control  Will get lipids today  4  Anemia  Repeat CBC   Concern that pt is on ASA and Plavix AND Naprosyn    Left note for [facility] for this   ? If naprosyn helping     5  Hx CVA    Will check CBC, BMET and lipid panel  F/U I in 6 months    Current medicines are reviewed  at length with the patient today.  The patient does not have concerns regarding medicines.  Signed, Dorris Carnes, MD  04/28/2017 9:32 AM    [address], [place], [place]  [address] Phone: [phone]; Fax: [phone]

## 2017-05-02 ENCOUNTER — Ambulatory Visit: Payer: Medicare Other | Admitting: Internal Medicine

## 2017-05-04 DIAGNOSIS — Z79899 Other long term (current) drug therapy: Secondary | ICD-10-CM | POA: Diagnosis not present

## 2017-05-04 DIAGNOSIS — M797 Fibromyalgia: Secondary | ICD-10-CM | POA: Diagnosis not present

## 2017-05-04 DIAGNOSIS — M2550 Pain in unspecified joint: Secondary | ICD-10-CM | POA: Diagnosis not present

## 2017-05-04 DIAGNOSIS — M7989 Other specified soft tissue disorders: Secondary | ICD-10-CM | POA: Diagnosis not present

## 2017-05-04 DIAGNOSIS — Z6841 Body Mass Index (BMI) 40.0 and over, adult: Secondary | ICD-10-CM | POA: Diagnosis not present

## 2017-05-04 DIAGNOSIS — M0609 Rheumatoid arthritis without rheumatoid factor, multiple sites: Secondary | ICD-10-CM | POA: Diagnosis not present

## 2017-05-12 DIAGNOSIS — E785 Hyperlipidemia, unspecified: Secondary | ICD-10-CM | POA: Diagnosis not present

## 2017-05-12 DIAGNOSIS — R69 Illness, unspecified: Secondary | ICD-10-CM | POA: Diagnosis not present

## 2017-05-17 DIAGNOSIS — E669 Obesity, unspecified: Secondary | ICD-10-CM | POA: Diagnosis not present

## 2017-05-17 DIAGNOSIS — M797 Fibromyalgia: Secondary | ICD-10-CM | POA: Diagnosis not present

## 2017-05-17 DIAGNOSIS — M2550 Pain in unspecified joint: Secondary | ICD-10-CM | POA: Diagnosis not present

## 2017-05-17 DIAGNOSIS — Z6839 Body mass index (BMI) 39.0-39.9, adult: Secondary | ICD-10-CM | POA: Diagnosis not present

## 2017-05-17 DIAGNOSIS — M0609 Rheumatoid arthritis without rheumatoid factor, multiple sites: Secondary | ICD-10-CM | POA: Diagnosis not present

## 2017-05-17 DIAGNOSIS — Z79899 Other long term (current) drug therapy: Secondary | ICD-10-CM | POA: Diagnosis not present

## 2017-05-27 ENCOUNTER — Ambulatory Visit (INDEPENDENT_AMBULATORY_CARE_PROVIDER_SITE_OTHER): Payer: Medicare Other | Admitting: Pharmacist

## 2017-05-27 VITALS — BP 180/82 | HR 84

## 2017-05-27 DIAGNOSIS — I10 Essential (primary) hypertension: Secondary | ICD-10-CM

## 2017-05-27 DIAGNOSIS — E782 Mixed hyperlipidemia: Secondary | ICD-10-CM

## 2017-05-27 DIAGNOSIS — I2510 Atherosclerotic heart disease of native coronary artery without angina pectoris: Secondary | ICD-10-CM

## 2017-05-27 MED ORDER — SPIRONOLACTONE 50 MG PO TABS: 50.0000 mg | ORAL_TABLET | Freq: Every day | ORAL | 11 refills | Status: DC

## 2017-05-27 MED ORDER — ROSUVASTATIN CALCIUM 40 MG PO TABS: 40.0000 mg | ORAL_TABLET | Freq: Every day | ORAL | 3 refills | Status: DC

## 2017-05-27 NOTE — Progress Notes (Signed)
Patient ID: Kathryn Lewis                 DOB: 1945-06-21                    MRN: [id number]     HPI: Kathryn Lewis is a 72 y.o. female patient referred to lipid clinic by Dr Harrington Challenger. PMH is significant for CAD, CVA, PAD, HTN, DM, seizures, and bipolar 1 disorder. She was started on Zetia 75m daily in January due to LDL of 74 above goal < 70. F/u lipid panel in April remained unchanged with LDL still at 74. She presents today for further management.  Pt presents today with her daughter. She reports adherence with her medications. [facility] to verify if pt has been receiving Zetia since it is very unusual for LDL to remain exactly the same after Zetia addition to statin therapy. They report pt has been receiving her atorvastatin and ezetimibe. Pt's daughter is frustrated that there are minimal healthy food options at [facility] Pt is often fed meals that are higher in sugar, carb, and sodium content, including potato salad, mac and cheese, cookies biscuits, beef stroganoff, and chicken pot pie. She is unable to eat lean meats or fresh vegetables. Reports that all foods are over-salted. She is concerned that her BP is running a bit high - confirmed at 180/82 today in clinic, much higher than last visit where BP was 128/80.  Current Medications: atorvastatin 873mdaily, ezetimibe 1053maily Risk Factors: CVA, CAD, PAD, DM, age LDL goal: <56m63m  Diet: [facility]. Is often fed potato salad, mac and cheese, cookies, and biscuits. Most food is high in sodium, frequent meals with  Mostly carbs and sugar. States there is no option for lean meats and salads.  Exercise: Minimal - post stroke, wheelchair bound.  Family History: The patient's family history includes Alcohol abuse in her father; Arthritis in her daughter, maternal grandmother, mother, and son; Asthma in her daughter;  Birth defects in her brother; COPD in her daughter, father, and son; Cancer in her maternal grandmother; Depression in her brother; Diabetes in her mother; Early death in her father and mother; Heart attack in her father and son; Hyperlipidemia in her brother, father, mother, and son; Hypertension in her brother, father, mother, sister, and son; Stroke in her mother.   Social History: The patient  reports that she quit smoking about 4 years ago. Her smoking use included cigarettes. She smoked 1.00 pack per day. She has never used smokeless tobacco. She reports that she does not drink alcohol or use drugs.   Labs: 04/28/17: TC 154, TG 126, HDL 55, LDL 74 (atorvastatin 80mg34mly, ezetimibe 10mg 48my) 02/02/17: TC 168, TG 156, HDL 63, LDL 74 (atorvastatin 80mg d65m)  Past Medical History:  Diagnosis Date  . Allergy   . Arthritis   . Bipolar 1 disorder (HCC)   [place] transfusion without reported diagnosis   . Carotid artery stenosis    50-69% bilateral ICA stenoses by velocity criteria but no visible plaque and ICA/CCA ratio 1.72 on right and 1.38 on left  . Coronary artery disease   . Depression   . Diabetes mellitus without complication (HCC)   [place]   . Left adrenal mass (HCC)    2.5 cm L adrenal mass on CT per note of Dr. Boyes [id number] [id number]  .  MI, old   . PAD (peripheral artery disease) ([place])    03/2014 - CT aortogram with lower extremity runoff (mild to moderate disease in common iliac arteries, complete occulusion of her bilateral external iliac arteries with heavily disease common femoral arteries. She also has disease to her SFAs bilaterally. Popliteal arteries and tibial vessel runoff appears to be adequate)  . Seizures ([place])   . Stroke [facility])     Current Outpatient Medications on File Prior to Visit  Medication Sig Dispense Refill  . albuterol (PROVENTIL HFA;VENTOLIN HFA) 108 (90 Base) MCG/ACT inhaler Inhale 2 puffs into the lungs every 6 (six) hours as needed for wheezing or  shortness of breath. 1 Inhaler 6  . amLODipine (NORVASC) 10 MG tablet Take 1 tablet (10 mg total) by mouth daily. 90 tablet 2  . aspirin 81 MG chewable tablet Chew 81 mg by mouth daily.    Marland Kitchen atorvastatin (LIPITOR) 80 MG tablet Take 1 tablet (80 mg total) by mouth daily. 90 tablet 3  . Cholecalciferol (VITAMIN D) 2000 units CAPS Take 2,000 capsules by mouth daily.     . clopidogrel (PLAVIX) 75 MG tablet Take 1 tablet (75 mg total) by mouth daily. 90 tablet 2  . ezetimibe (ZETIA) 10 MG tablet Take 1 tablet (10 mg total) by mouth daily. 90 tablet 3  . ferrous sulfate 325 (65 FE) MG tablet Take 325 mg by mouth daily.     . fluticasone (FLONASE) 50 MCG/ACT nasal spray Place 2 sprays into both nostrils daily. 16 g 6  . isosorbide mononitrate (IMDUR) 30 MG 24 hr tablet Take 30 mg by mouth every morning.     . lamoTRIgine (LAMICTAL) 200 MG tablet Take 300 mg by mouth 2 (two) times daily.    . metFORMIN (GLUCOPHAGE) 500 MG tablet Take 1 tablet (500 mg total) by mouth 2 (two) times daily with a meal. 180 tablet 2  . metoprolol tartrate (LOPRESSOR) 25 MG tablet Take 1 tablet (25 mg total) by mouth 2 (two) times daily. 180 tablet 2  . naproxen (NAPROSYN) 500 MG tablet Take 1 tablet (500 mg total) by mouth 2 (two) times daily with a meal. 30 tablet 2  . nitroGLYCERIN (NITROSTAT) 0.4 MG SL tablet Place 0.4 mg under the tongue every 5 (five) minutes as needed for chest pain.    . phenytoin (DILANTIN) 100 MG ER capsule Take 3 capsules (300 mg total) by mouth at bedtime. 270 capsule 0  . phenytoin (DILANTIN) 30 MG ER capsule Take 1 capsule (30 mg total) by mouth at bedtime. 90 capsule 0  . Spacer/Aero Chamber Mouthpiece MISC 1 Device by Does not apply route daily as needed. 1 each 0  . spironolactone (ALDACTONE) 25 MG tablet Take 1 tablet (25 mg total) by mouth daily. 30 tablet 0   No current facility-administered medications on file prior to visit.     Allergies  Allergen Reactions  . Amoxicillin Other (See  Comments)    colitis  . Augmentin [Amoxicillin-Pot Clavulanate] Other (See Comments)    colitis  . Cephalexin Other (See Comments)    colitis  . Erythromycin Other (See Comments)    colitis  . Niacin And Related Other (See Comments)    colitis  . Other Other (See Comments)    Assessment/Plan:  1. Hyperlipidemia - LDL remained unchanged at 58m/dL after adding Zetia 172mdaily to Lipitor 801maily. [facility] as this is unusual  to have no decrease in LDL at all after the addition of Zetia. Discussed PCSK9i therapy, however pt does not wish to start this. Will instead stop Lipitor 23m daily and start Crestor 470mdaily in hopes that slightly more efficacious statin will bring LDL to goal < 70 due to extensive CAD history. Unfortunately healthy [facility] in 3 months.  2. Hypertension - BP elevated at 180/82 today in clinic. [facility]. Unfortunately, minimal options for healthier food selections. Will increase spironolactone to 5037maily and continue metoprolol tartrate 95m8mD and amlodipine 10mg23mly. F/u in HTN clinic in 2 weeks for BP check and BMET. Can further titrate spironolactone or switch metoprolol to carvedilol at that time if additional BP lowering is needed.   Megan E. Supple, PharmD, CPP, BCACPManassas [id number] Brook [id number]: [phone]: [phone] 12:19 PM

## 2017-05-27 NOTE — Patient Instructions (Addendum)
Increase your spironolactone to 50mg  once daily  Stop taking your atorvastatin (Lipitor) 80mg  daily  Start taking rosuvastatin (Crestor) 40mg  daily  Recheck blood pressure and lab work in 2 weeks. You do not need to fast for this lab work  Limit sodium to less than 2 grams daily  Limit intake of saturated fat  Recheck cholesterol in 3 months on Monday August 5th. Come in any time after 7:30am for fasting labs.

## 2017-05-30 ENCOUNTER — Encounter (HOSPITAL_COMMUNITY): Payer: Self-pay | Admitting: *Deleted

## 2017-05-30 ENCOUNTER — Emergency Department (HOSPITAL_COMMUNITY): Payer: Medicare Other

## 2017-05-30 ENCOUNTER — Emergency Department (HOSPITAL_COMMUNITY)
Admission: EM | Admit: 2017-05-30 | Discharge: 2017-05-31 | Disposition: A | Discharge status: Home / Self Care | Payer: Medicare Other | Attending: Emergency Medicine | Admitting: Emergency Medicine

## 2017-05-30 DIAGNOSIS — Z7902 Long term (current) use of antithrombotics/antiplatelets: Secondary | ICD-10-CM | POA: Diagnosis not present

## 2017-05-30 DIAGNOSIS — E119 Type 2 diabetes mellitus without complications: Secondary | ICD-10-CM | POA: Insufficient documentation

## 2017-05-30 DIAGNOSIS — Y929 Unspecified place or not applicable: Secondary | ICD-10-CM | POA: Insufficient documentation

## 2017-05-30 DIAGNOSIS — J449 Chronic obstructive pulmonary disease, unspecified: Secondary | ICD-10-CM | POA: Diagnosis not present

## 2017-05-30 DIAGNOSIS — Z23 Encounter for immunization: Secondary | ICD-10-CM | POA: Diagnosis not present

## 2017-05-30 DIAGNOSIS — Y999 Unspecified external cause status: Secondary | ICD-10-CM | POA: Insufficient documentation

## 2017-05-30 DIAGNOSIS — Z87891 Personal history of nicotine dependence: Secondary | ICD-10-CM | POA: Diagnosis not present

## 2017-05-30 DIAGNOSIS — W01198A Fall on same level from slipping, tripping and stumbling with subsequent striking against other object, initial encounter: Secondary | ICD-10-CM | POA: Insufficient documentation

## 2017-05-30 DIAGNOSIS — Z7982 Long term (current) use of aspirin: Secondary | ICD-10-CM | POA: Insufficient documentation

## 2017-05-30 DIAGNOSIS — I10 Essential (primary) hypertension: Secondary | ICD-10-CM | POA: Diagnosis not present

## 2017-05-30 DIAGNOSIS — W19XXXA Unspecified fall, initial encounter: Secondary | ICD-10-CM

## 2017-05-30 DIAGNOSIS — Z8673 Personal history of transient ischemic attack (TIA), and cerebral infarction without residual deficits: Secondary | ICD-10-CM | POA: Diagnosis not present

## 2017-05-30 DIAGNOSIS — S0191XA Laceration without foreign body of unspecified part of head, initial encounter: Secondary | ICD-10-CM | POA: Diagnosis not present

## 2017-05-30 DIAGNOSIS — S0990XA Unspecified injury of head, initial encounter: Secondary | ICD-10-CM | POA: Insufficient documentation

## 2017-05-30 DIAGNOSIS — Z7984 Long term (current) use of oral hypoglycemic drugs: Secondary | ICD-10-CM | POA: Insufficient documentation

## 2017-05-30 DIAGNOSIS — S199XXA Unspecified injury of neck, initial encounter: Secondary | ICD-10-CM | POA: Diagnosis not present

## 2017-05-30 DIAGNOSIS — Y939 Activity, unspecified: Secondary | ICD-10-CM | POA: Insufficient documentation

## 2017-05-30 DIAGNOSIS — I252 Old myocardial infarction: Secondary | ICD-10-CM | POA: Insufficient documentation

## 2017-05-30 DIAGNOSIS — S0093XA Contusion of unspecified part of head, initial encounter: Secondary | ICD-10-CM | POA: Diagnosis not present

## 2017-05-30 DIAGNOSIS — I2510 Atherosclerotic heart disease of native coronary artery without angina pectoris: Secondary | ICD-10-CM | POA: Insufficient documentation

## 2017-05-30 NOTE — ED Provider Notes (Signed)
Patient placed in Quick Look pathway, seen and evaluated   Chief Complaint: fall  HPI:   Pt tripped on a blanket and fell at nursing home facility. Hit head on a wheel chair. No LOC but got dazed and dizzy. Pt reports minimal headache. Denies dizziness. Denies vomiting. Pt is on plavix.   ROS: Positive for headache and skin contusion.  Negative for dizziness, lightheadedness, nausea, vomiting, changes in vision  Physical Exam:   Gen: No distress  Neuro: Awake and Alert  Skin: Warm    Focused Exam: Large hematoma to posterior scalp. TTP. PERRLA. Moving all extremities. No cervical spine tenderness. Full rom of all joints.    Initiation of care has begun. The patient has been counseled on the process, plan, and necessity for staying for the completion/evaluation, and the remainder of the medical screening examination   PT with mechanical fall today. Large hematoma to back of the head, on plavix.  Will get CT head and cervical spine for further evaluation.  Blood pressure found to be markedly elevated at 224/96.  Patient states that she is compliant with her medications and has taken them today.  We will recheck.  Offered pain medication, patient refused.  Vitals:   05/30/17 1807  BP: (!) 224/96  Pulse: 76  Resp: 16  Temp: 98.5 F (36.9 C)  TempSrc: Oral  SpO2: 100%      Kathryn Senior, PA-C 05/30/17 Kathryn Lewis    Kathryn Chapel, MD 06/01/17 [phone]

## 2017-05-30 NOTE — ED Triage Notes (Signed)
To ED for eval after falling and hitting head. Pt states her feet got caught in a blanket. With hematoma to back of her head. Denies further injury. Moves all extremities without difficulty. No numbness or tingling. Injury happened around 2pm. No nausea or vomiting. Pt is acting her normal per family. Pt lives at [facility] facility. Brought to ED via POV by family.

## 2017-05-31 ENCOUNTER — Encounter (HOSPITAL_COMMUNITY): Payer: Self-pay | Admitting: Emergency Medicine

## 2017-05-31 DIAGNOSIS — S0990XA Unspecified injury of head, initial encounter: Secondary | ICD-10-CM | POA: Diagnosis not present

## 2017-05-31 MED ORDER — TETANUS-DIPHTH-ACELL PERTUSSIS 5-2.5-18.5 LF-MCG/0.5 IM SUSP
0.5000 mL | Freq: Once | INTRAMUSCULAR | Status: AC
  Administered 2017-05-31: 0.5 mL via INTRAMUSCULAR
  Filled 2017-05-31: qty 0.5

## 2017-05-31 MED ORDER — BACITRACIN ZINC 500 UNIT/GM EX OINT
TOPICAL_OINTMENT | Freq: Two times a day (BID) | CUTANEOUS | Status: DC
  Filled 2017-05-31: qty 0.9

## 2017-05-31 MED ORDER — IBUPROFEN 800 MG PO TABS
800.0000 mg | ORAL_TABLET | Freq: Once | ORAL | Status: AC
  Administered 2017-05-31: 800 mg via ORAL
  Filled 2017-05-31: qty 1

## 2017-05-31 MED ORDER — ACETAMINOPHEN 500 MG PO TABS
1000.0000 mg | ORAL_TABLET | Freq: Once | ORAL | Status: AC
  Administered 2017-05-31: 1000 mg via ORAL
  Filled 2017-05-31: qty 2

## 2017-05-31 NOTE — ED Notes (Signed)
PT states understanding of care given, follow up care, and medication prescribed. PT ambulated from ED to car with a steady gait. 

## 2017-05-31 NOTE — ED Provider Notes (Signed)
[facility] EMERGENCY DEPARTMENT Provider Note   CSN: [id number] Arrival date & time: 05/30/17  1707     History   Chief Complaint Chief Complaint  Patient presents with  . Fall  . Head Injury    HPI Kathryn Lewis is a 72 y.o. female.  The history is provided by the patient.  Fall  This is a new problem. The current episode started 12 to 24 hours ago. The problem occurs constantly. The problem has not changed since onset.Pertinent negatives include no chest pain, no abdominal pain, no headaches and no shortness of breath. Nothing aggravates the symptoms. Nothing relieves the symptoms. She has tried nothing for the symptoms. The treatment provided no relief.  Head Injury      Past Medical History:  Diagnosis Date  . Allergy   . Arthritis   . Bipolar 1 disorder ([place])   . Blood transfusion without reported diagnosis   . Carotid artery stenosis    50-69% bilateral ICA stenoses by velocity criteria but no visible plaque and ICA/CCA ratio 1.72 on right and 1.38 on left  . Coronary artery disease   . Depression   . Diabetes mellitus without complication ([place])   . Hypertension   . Left adrenal mass (H[facility])    2.5 cm L adrenal mass on CT per note of Dr. Seleta Rhymes 05/20/2014  . MI, old   . PAD (peripheral artery disease) ([place])    03/2014 - CT aortogram with lower extremity runoff (mild to moderate disease in common iliac arteries, complete occulusion of her bilateral external iliac arteries with heavily disease common femoral arteries. She also has disease to her SFAs bilaterally. Popliteal arteries and tibial vessel runoff appears to be adequate)  . Seizures ([place])   . Stroke [facility])     Patient Active Problem List   Diagnosis Date Noted  . Mixed hyperlipidemia 05/27/2017  . Unstable gait 01/10/2017  . Hyperlipidemia associated with type 2 diabetes mellitus ([place]) 01/10/2017  . OSA on CPAP 12/24/2016  . COPD (chronic obstructive pulmonary disease) ([place]) 12/24/2016   . Pulmonary nodule 12/24/2016  . Generalized weakness 12/12/2015  . Diabetes mellitus type 2 with neurological manifestations ([place]) 12/12/2015  . Essential hypertension 12/12/2015  . Hypertensive urgency 04/17/2015  . Seizure disorder as sequela of cerebrovascular accident ([place]) 04/17/2015  . CAD (coronary artery disease) 04/17/2015    Past Surgical History:  Procedure Laterality Date  . BREAST SURGERY  2012   small lump nonmilignant  . NECK SURGERY       OB History   None      Home Medications    Prior to Admission medications   Medication Sig Start Date End Date Taking? Authorizing Provider  albuterol (PROVENTIL HFA;VENTOLIN HFA) 108 (90 Base) MCG/ACT inhaler Inhale 2 puffs into the lungs every 6 (six) hours as needed for wheezing or shortness of breath. Patient taking differently: Inhale 2 puffs into the lungs 4 (four) times daily as needed for wheezing or shortness of breath.  12/24/16  Yes Rigoberto Noel, MD  amLODipine (NORVASC) 10 MG tablet Take 1 tablet (10 mg total) by mouth daily. 01/10/17  Yes Martinique, Betty G, MD  aspirin EC 81 MG tablet Take 81 mg by mouth daily.   Yes [provider]  Cholecalciferol (VITAMIN D) 2000 units CAPS Take 2,000 capsules by mouth daily.    Yes [provider]  clopidogrel (PLAVIX) 75 MG tablet Take 1 tablet (75 mg total) by mouth daily. 01/10/17  Yes Martinique, Betty G, MD  cyclobenzaprine (FLEXERIL) 10 MG tablet Take 10 mg by mouth 3 (three) times daily as needed (lower back pain).   Yes [provider]  ezetimibe (ZETIA) 10 MG tablet Take 10 mg by mouth daily.   Yes [provider]  ferrous sulfate 325 (65 FE) MG tablet Take 325 mg by mouth 2 (two) times daily.    Yes [provider]  fluticasone (FLONASE) 50 MCG/ACT nasal spray Place 2 sprays into both nostrils daily. 04/22/17  Yes Nafziger, Tommi Rumps, NP  furosemide (LASIX) 40 MG tablet Take 40 mg by mouth daily as needed (water retention).   Yes  [provider]  isosorbide mononitrate (IMDUR) 30 MG 24 hr tablet Take 30 mg by mouth daily.    Yes [provider]  lamoTRIgine (LAMICTAL) 200 MG tablet Take 300 mg by mouth See admin instructions. Take 1 1/2 tablets (300 mg) by mouth twice daily - 9am and 2pm   Yes [provider]  metFORMIN (GLUCOPHAGE) 500 MG tablet Take 1 tablet (500 mg total) by mouth 2 (two) times daily with a meal. 01/10/17  Yes Martinique, Betty G, MD  metoprolol tartrate (LOPRESSOR) 25 MG tablet Take 1 tablet (25 mg total) by mouth 2 (two) times daily. Patient taking differently: Take 25 mg by mouth 2 (two) times daily with a meal.  01/10/17  Yes Martinique, Betty G, MD  naproxen (NAPROSYN) 500 MG tablet Take 1 tablet (500 mg total) by mouth 2 (two) times daily with a meal. 01/07/17  Yes Young, Vanessa [place], PA-C  Omega-3 Fatty Acids (FISH OIL) 1000 MG CAPS Take 2,000 mg by mouth daily.   Yes [provider]  phenytoin (DILANTIN) 100 MG ER capsule Take 3 capsules (300 mg total) by mouth at bedtime. 03/25/17  Yes Martinique, Betty G, MD  phenytoin (DILANTIN) 30 MG ER capsule Take 1 capsule (30 mg total) by mouth at bedtime. 03/25/17  Yes Martinique, Betty G, MD  potassium chloride (K-DUR,KLOR-CON) 10 MEQ tablet Take 10 mEq by mouth daily as needed (with each dose of Lasix/furosemide).   Yes [provider]  PRESCRIPTION MEDICATION Inhale into the lungs at bedtime. CPAP/BIPAP   Yes [provider]  rosuvastatin (CRESTOR) 40 MG tablet Take 1 tablet (40 mg total) by mouth daily. 05/27/17 08/25/17 Yes Fay Records, MD  spironolactone (ALDACTONE) 50 MG tablet Take 1 tablet (50 mg total) by mouth daily. 05/27/17  Yes Fay Records, MD  Spacer/Aero Chamber Mouthpiece MISC 1 Device by Does not apply route daily as needed. 12/24/16   Rigoberto Noel, MD    Family History Family History  Problem Relation Age of Onset  . Hypertension Mother   . Arthritis Mother   . Diabetes Mother   . Early death  Mother   . Hyperlipidemia Mother   . Stroke Mother   . Hypertension Father   . Alcohol abuse Father   . Early death Father   . Hyperlipidemia Father   . COPD Father   . Heart attack Father   . Hypertension Sister   . Birth defects Brother   . Depression Brother   . Hyperlipidemia Brother   . Hypertension Brother   . Arthritis Daughter   . Asthma Daughter   . COPD Daughter   . Arthritis Son   . COPD Son   . Hypertension Son   . Hyperlipidemia Son   . Heart attack Son   . Arthritis Maternal Grandmother   .  Cancer Maternal Grandmother     Social History Social History   Tobacco Use  . Smoking status: Former Smoker    Packs/day: 1.00    Types: Cigarettes    Last attempt to quit: 2015    Years since quitting: 4.3  . Smokeless tobacco: Never Used  Substance Use Topics  . Alcohol use: No  . Drug use: No     Allergies   Amoxicillin; Augmentin [amoxicillin-pot clavulanate]; Cephalexin; Erythromycin; Niacin and related; Cefadroxil; and Nitrofurantoin macrocrystal   Review of Systems Review of Systems  Respiratory: Negative for shortness of breath.   Cardiovascular: Negative for chest pain.  Gastrointestinal: Negative for abdominal pain.  Neurological: Negative for headaches.  All other systems reviewed and are negative.    Physical Exam Updated Vital Signs BP (!) 166/113   Pulse 81   Temp 97.8 F (36.6 C) (Oral)   Resp 16   LMP 03/17/2015   SpO2 98%   Physical Exam  Constitutional: She appears well-developed and well-nourished. No distress.  HENT:  Head: Normocephalic. Head is without raccoon's eyes and without Battle's sign.    Nose: Nose normal.  Mouth/Throat: Oropharynx is clear and moist. No oropharyngeal exudate.  Eyes: Pupils are equal, round, and reactive to light. Conjunctivae are normal.  Neck: Normal range of motion. Neck supple.  Cardiovascular: Normal rate, regular rhythm, normal heart sounds and intact distal pulses.  Pulmonary/Chest:  Effort normal and breath sounds normal. No stridor. She has no wheezes. She has no rales.  Abdominal: Soft. Bowel sounds are normal. She exhibits no mass. There is no tenderness. There is no rebound and no guarding.  Musculoskeletal: Normal range of motion. She exhibits no deformity.  Neurological: She is alert. She displays normal reflexes.  Skin: Skin is warm and dry. Capillary refill takes less than 2 seconds.  Psychiatric: She has a normal mood and affect.     ED Treatments / Results  Labs (all labs ordered are listed, but only abnormal results are displayed) Labs Reviewed - No data to display  EKG None  Radiology Ct Head Wo Contrast  Result Date: 05/30/2017 CLINICAL DATA:  [place]. Head laceration. Patient is anticoagulated on Plavix. EXAM: CT HEAD WITHOUT CONTRAST CT CERVICAL SPINE WITHOUT CONTRAST TECHNIQUE: Multidetector CT imaging of the head and cervical spine was performed following the standard protocol without intravenous contrast. Multiplanar CT image reconstructions of the cervical spine were also generated. COMPARISON:  06/16/2016. FINDINGS: CT HEAD FINDINGS Brain: No evidence for acute infarction, hemorrhage, mass lesion, hydrocephalus, or extra-axial fluid. Generalized atrophy. Chronic microvascular ischemic change. Old RIGHT hemisphere infarct. Vascular: Calcification of the cavernous internal carotid arteries consistent with cerebrovascular atherosclerotic disease. No signs of intracranial large vessel occlusion. Skull: Calvarium intact. Chronic RIGHT parietal hemangioma. Large RIGHT parietal scalp hematoma. Sinuses/Orbits: No layering sinus fluid.  Negative orbits. Other: No temporal bone fluid or inflammatory process. Compared with 2018, similar appearance to the brain. CT CERVICAL SPINE FINDINGS Alignment: Straightening of the normal cervical lordosis. No traumatic subluxation. Skull base and vertebrae: No fracture.  Solid C5-6 ACDF. Soft tissues and spinal canal: No  prevertebral fluid or swelling. No visible canal hematoma. Disc levels:  Multilevel facet arthropathy. Upper chest: Unremarkable. Other: None. IMPRESSION: Atrophy and small vessel disease. Old RIGHT hemisphere infarct. Cerebrovascular atherosclerotic disease. No skull fracture or intracranial hemorrhage. Large RIGHT parietal scalp hematoma. No cervical spine fracture or traumatic subluxation. Multilevel facet arthropathy. Old C5-6 fusion appears uncomplicated. Electronically Signed   By: Roderic Ovens.D.  On: 05/30/2017 21:32   Ct Cervical Spine Wo Contrast  Result Date: 05/30/2017 CLINICAL DATA:  [place]. Head laceration. Patient is anticoagulated on Plavix. EXAM: CT HEAD WITHOUT CONTRAST CT CERVICAL SPINE WITHOUT CONTRAST TECHNIQUE: Multidetector CT imaging of the head and cervical spine was performed following the standard protocol without intravenous contrast. Multiplanar CT image reconstructions of the cervical spine were also generated. COMPARISON:  06/16/2016. FINDINGS: CT HEAD FINDINGS Brain: No evidence for acute infarction, hemorrhage, mass lesion, hydrocephalus, or extra-axial fluid. Generalized atrophy. Chronic microvascular ischemic change. Old RIGHT hemisphere infarct. Vascular: Calcification of the cavernous internal carotid arteries consistent with cerebrovascular atherosclerotic disease. No signs of intracranial large vessel occlusion. Skull: Calvarium intact. Chronic RIGHT parietal hemangioma. Large RIGHT parietal scalp hematoma. Sinuses/Orbits: No layering sinus fluid.  Negative orbits. Other: No temporal bone fluid or inflammatory process. Compared with 2018, similar appearance to the brain. CT CERVICAL SPINE FINDINGS Alignment: Straightening of the normal cervical lordosis. No traumatic subluxation. Skull base and vertebrae: No fracture.  Solid C5-6 ACDF. Soft tissues and spinal canal: No prevertebral fluid or swelling. No visible canal hematoma. Disc levels:  Multilevel facet arthropathy.  Upper chest: Unremarkable. Other: None. IMPRESSION: Atrophy and small vessel disease. Old RIGHT hemisphere infarct. Cerebrovascular atherosclerotic disease. No skull fracture or intracranial hemorrhage. Large RIGHT parietal scalp hematoma. No cervical spine fracture or traumatic subluxation. Multilevel facet arthropathy. Old C5-6 fusion appears uncomplicated. Electronically Signed   By: Staci Righter M.D.   On: 05/30/2017 21:32    Procedures Procedures (including critical care time)  Medications Ordered in ED Medications  bacitracin ointment (has no administration in time range)  Tdap (BOOSTRIX) injection 0.5 mL (0.5 mLs Intramuscular Given 05/31/17 0527)  acetaminophen (TYLENOL) tablet 1,000 mg (1,000 mg Oral Given 05/31/17 0527)  ibuprofen (ADVIL,MOTRIN) tablet 800 mg (800 mg Oral Given 05/31/17 0527)     Wound care   Final Clinical Impressions(s) / ED Diagnoses   Final diagnoses:  Fall, initial encounter   Return for weakness, numbness, changes in vision or speech, fevers >100.4 unrelieved by medication, shortness of breath, intractable vomiting, or diarrhea, abdominal pain, Inability to tolerate liquids or food, cough, altered mental status or any concerns. No signs of systemic illness or infection. The patient is nontoxic-appearing on exam and vital signs are within normal limits.   I have reviewed the triage vital signs and the nursing notes. Pertinent labs &imaging results that were available during my care of the patient were reviewed by me and considered in my medical decision making (see chart for details).  After history, exam, and medical workup I feel the patient has been appropriately medically screened and is safe for discharge home. Pertinent diagnoses were discussed with the patient. Patient was given return precautions.    Gretel Cantu, MD 05/31/17 0600

## 2017-06-01 ENCOUNTER — Telehealth: Payer: Self-pay | Admitting: Family Medicine

## 2017-06-01 NOTE — Telephone Encounter (Signed)
Message sent to Dr. Jordan for review and approval. 

## 2017-06-01 NOTE — Telephone Encounter (Signed)
Copied from [place] [id number]. Topic: Quick Communication - See Telephone Encounter >> Jun 01, 2017  3:18 PM Margot Ables wrote: CRM for notification. See Telephone encounter for: 06/01/17.  Pts cpap is broken and pts daughter is trying to see if they can get a new one. Pt is not using and has it up in the closet. Since it is not being used they need orders to d/c the cpap. Fax d/c order to [phone].

## 2017-06-03 NOTE — Telephone Encounter (Signed)
Spoke with daughter and informed her that she needed to contact the pulmonary office for d/c orders and she verbalized understanding.

## 2017-06-03 NOTE — Telephone Encounter (Signed)
She needs to request CPAP supplies from her pulmonologist's office. Last visit with pulmonologist in 12/2016.  Thanks

## 2017-06-07 ENCOUNTER — Telehealth: Payer: Self-pay | Admitting: Family Medicine

## 2017-06-07 NOTE — Telephone Encounter (Signed)
Copied from [place] [id number]. Topic: Quick Communication - See Telephone Encounter >> Jun 07, 2017  2:13 PM Vernona Rieger wrote: CRM for notification. See Telephone encounter for: 06/07/17.  Alisha from [facility] called in regards to her being on a healthy heart diet or a 2 gram sodium diet. It has to be one or the other, it can not be both.They faxed this over on May 3rd and have not heard back. Please fax back @ [phone]. Call back if needed [phone]

## 2017-06-07 NOTE — Telephone Encounter (Signed)
Spoke with Delos Haring and she stated that we needed to fax over the diet on a Physician order sheet.

## 2017-06-07 NOTE — Telephone Encounter (Signed)
Because medical Hx she needs to be on a low sodium healthy heart diet.  Thanks, BJ

## 2017-06-07 NOTE — Telephone Encounter (Signed)
Message sent to Dr. Jordan for review. 

## 2017-06-08 NOTE — Telephone Encounter (Signed)
Diet order form faxed back to [facility] on 06/08/17.

## 2017-06-09 ENCOUNTER — Encounter (HOSPITAL_COMMUNITY): Payer: Self-pay | Admitting: Emergency Medicine

## 2017-06-09 ENCOUNTER — Other Ambulatory Visit: Payer: Self-pay

## 2017-06-09 ENCOUNTER — Emergency Department (HOSPITAL_COMMUNITY)
Admission: EM | Admit: 2017-06-09 | Discharge: 2017-06-09 | Disposition: A | Discharge status: Home / Self Care | Payer: Medicare Other | Attending: Emergency Medicine | Admitting: Emergency Medicine

## 2017-06-09 DIAGNOSIS — I69320 Aphasia following cerebral infarction: Secondary | ICD-10-CM | POA: Insufficient documentation

## 2017-06-09 DIAGNOSIS — Z5321 Procedure and treatment not carried out due to patient leaving prior to being seen by health care provider: Secondary | ICD-10-CM | POA: Insufficient documentation

## 2017-06-09 DIAGNOSIS — M25511 Pain in right shoulder: Secondary | ICD-10-CM | POA: Insufficient documentation

## 2017-06-09 NOTE — ED Notes (Signed)
Patient decided to leave after triage

## 2017-06-09 NOTE — ED Triage Notes (Signed)
Pt brought in by her daughter because of bruising to R deltoid after tetanus shot 1 week ago. Pt has hx of stroke with expressive aphasia. History given by daughter at bedside.

## 2017-06-13 ENCOUNTER — Other Ambulatory Visit: Payer: Self-pay | Admitting: Pharmacist

## 2017-06-13 ENCOUNTER — Other Ambulatory Visit: Payer: Medicare Other | Admitting: *Deleted

## 2017-06-13 DIAGNOSIS — I10 Essential (primary) hypertension: Secondary | ICD-10-CM

## 2017-06-13 LAB — BASIC METABOLIC PANEL
BUN / CREAT RATIO: 19 (ref 12–28)
BUN: 15 mg/dL (ref 8–27)
CHLORIDE: 101 mmol/L (ref 96–106)
CO2: 25 mmol/L (ref 20–29)
Calcium: 10.7 mg/dL — ABNORMAL HIGH (ref 8.7–10.3)
Creatinine, Ser: 0.79 mg/dL (ref 0.57–1.00)
GFR calc Af Amer: 87 mL/min/{1.73_m2} (ref >59)
GFR calc non Af Amer: 76 mL/min/{1.73_m2} (ref >59)
GLUCOSE: 105 mg/dL — AB (ref 65–99)
Potassium: 4.5 mmol/L (ref 3.5–5.2)
SODIUM: 141 mmol/L (ref 134–144)

## 2017-06-14 ENCOUNTER — Ambulatory Visit: Payer: Medicare Other

## 2017-06-21 ENCOUNTER — Other Ambulatory Visit: Payer: Self-pay | Admitting: *Deleted

## 2017-06-21 NOTE — Addendum Note (Signed)
Addended by: Zacarias Pontes on: 06/21/2017 03:51 PM   Modules accepted: Orders

## 2017-06-22 ENCOUNTER — Other Ambulatory Visit: Payer: Self-pay | Admitting: *Deleted

## 2017-06-22 MED ORDER — EZETIMIBE 10 MG PO TABS: 10.0000 mg | ORAL_TABLET | Freq: Every day | ORAL | 3 refills | Status: DC

## 2017-06-24 ENCOUNTER — Telehealth: Payer: Self-pay | Admitting: Family Medicine

## 2017-06-24 DIAGNOSIS — F3130 Bipolar disorder, current episode depressed, mild or moderate severity, unspecified: Secondary | ICD-10-CM | POA: Diagnosis not present

## 2017-06-24 DIAGNOSIS — E1151 Type 2 diabetes mellitus with diabetic peripheral angiopathy without gangrene: Secondary | ICD-10-CM | POA: Diagnosis not present

## 2017-06-24 DIAGNOSIS — M1991 Primary osteoarthritis, unspecified site: Secondary | ICD-10-CM | POA: Diagnosis not present

## 2017-06-24 DIAGNOSIS — I10 Essential (primary) hypertension: Secondary | ICD-10-CM | POA: Diagnosis not present

## 2017-06-24 DIAGNOSIS — I2510 Atherosclerotic heart disease of native coronary artery without angina pectoris: Secondary | ICD-10-CM | POA: Diagnosis not present

## 2017-06-24 DIAGNOSIS — I252 Old myocardial infarction: Secondary | ICD-10-CM | POA: Diagnosis not present

## 2017-06-24 NOTE — Telephone Encounter (Signed)
Spoke with Orland Mustard, gave verbal orders per Dr. Martinique.

## 2017-06-24 NOTE — Telephone Encounter (Signed)
Copied from [place] [id number]. Topic: Quick Communication - See Telephone Encounter >> Jun 24, 2017  2:21 PM Keene Breath wrote: CRM for notification. See Telephone encounter for: 06/24/17.  Flor, PT, from [facility] would like verbal orders to see the patient for physical therapy, 2 x wk for 3 wks. She stated she took her BP while she was there this morning, it was 150/90 w/ no symptoms.   Call back:  [phone].

## 2017-06-24 NOTE — Telephone Encounter (Signed)
Message sent to Dr. Jordan for review and approval. 

## 2017-06-24 NOTE — Telephone Encounter (Signed)
It is okay to give verbal order for PT as requested.  Thanks, BJ

## 2017-06-28 ENCOUNTER — Telehealth: Payer: Self-pay | Admitting: *Deleted

## 2017-06-28 DIAGNOSIS — E1151 Type 2 diabetes mellitus with diabetic peripheral angiopathy without gangrene: Secondary | ICD-10-CM | POA: Diagnosis not present

## 2017-06-28 DIAGNOSIS — M1991 Primary osteoarthritis, unspecified site: Secondary | ICD-10-CM | POA: Diagnosis not present

## 2017-06-28 DIAGNOSIS — I252 Old myocardial infarction: Secondary | ICD-10-CM | POA: Diagnosis not present

## 2017-06-28 DIAGNOSIS — F3130 Bipolar disorder, current episode depressed, mild or moderate severity, unspecified: Secondary | ICD-10-CM | POA: Diagnosis not present

## 2017-06-28 DIAGNOSIS — I2510 Atherosclerotic heart disease of native coronary artery without angina pectoris: Secondary | ICD-10-CM | POA: Diagnosis not present

## 2017-06-28 DIAGNOSIS — I10 Essential (primary) hypertension: Secondary | ICD-10-CM | POA: Diagnosis not present

## 2017-06-28 NOTE — Telephone Encounter (Signed)
Refill request for Dilatin 30 mg capsule

## 2017-06-30 ENCOUNTER — Telehealth: Payer: Self-pay | Admitting: Family Medicine

## 2017-06-30 DIAGNOSIS — M1991 Primary osteoarthritis, unspecified site: Secondary | ICD-10-CM | POA: Diagnosis not present

## 2017-06-30 DIAGNOSIS — I2510 Atherosclerotic heart disease of native coronary artery without angina pectoris: Secondary | ICD-10-CM | POA: Diagnosis not present

## 2017-06-30 DIAGNOSIS — F3130 Bipolar disorder, current episode depressed, mild or moderate severity, unspecified: Secondary | ICD-10-CM | POA: Diagnosis not present

## 2017-06-30 DIAGNOSIS — E1151 Type 2 diabetes mellitus with diabetic peripheral angiopathy without gangrene: Secondary | ICD-10-CM | POA: Diagnosis not present

## 2017-06-30 DIAGNOSIS — I10 Essential (primary) hypertension: Secondary | ICD-10-CM | POA: Diagnosis not present

## 2017-06-30 DIAGNOSIS — I252 Old myocardial infarction: Secondary | ICD-10-CM | POA: Diagnosis not present

## 2017-06-30 NOTE — Telephone Encounter (Signed)
Copied from [place] [id number]. Topic: General - Other >> Jun 30, 2017 12:30 PM Carolyn Stare wrote:  Kathryn Lewis with [facility] would like a call back. She said she has question about what may be causing her weakness since she has a history of strokes.    [phone]

## 2017-07-01 NOTE — Telephone Encounter (Signed)
Kathryn Lewis was last seen in 12/2016. She has history of abnormal gait. If acute new weakness presents and there is a suspicion for stroke, she needs to be taken to the ER immediately. In general ideally BP to be less than 140/90. She needs to arrange follow-up appointment.  Thanks, BJ

## 2017-07-01 NOTE — Telephone Encounter (Signed)
Spoke with Kathryn Lewis, PT from [facility] - needing to confirm for insurance if current condition is related to stroke history. Pt having weakness and abnormal gait, is this from her Stroke?  Pt also having high BPs readings - are there any parameters for BP readings that need to be placed? How high or low can it go without change to treatment?   Please advise Dr Martinique. Thanks.

## 2017-07-01 NOTE — Telephone Encounter (Signed)
Flor notified of instructions and verbalized understanding. States patient is not having acute symptoms, they just need to confirm she has a history of stroke for insurance purposes. Patient resides in ALF and Orland Mustard will let ALF staff know that patient needs a follow-up appt.

## 2017-07-01 NOTE — Telephone Encounter (Signed)
LM for [place] [employer] with [facility]

## 2017-07-04 ENCOUNTER — Other Ambulatory Visit: Payer: Self-pay | Admitting: Family Medicine

## 2017-07-04 MED ORDER — PHENYTOIN SODIUM EXTENDED 30 MG PO CAPS: 30.0000 mg | ORAL_CAPSULE | Freq: Every day | ORAL | 0 refills | Status: DC

## 2017-07-05 DIAGNOSIS — M1991 Primary osteoarthritis, unspecified site: Secondary | ICD-10-CM | POA: Diagnosis not present

## 2017-07-05 DIAGNOSIS — I2510 Atherosclerotic heart disease of native coronary artery without angina pectoris: Secondary | ICD-10-CM | POA: Diagnosis not present

## 2017-07-05 DIAGNOSIS — I10 Essential (primary) hypertension: Secondary | ICD-10-CM | POA: Diagnosis not present

## 2017-07-05 DIAGNOSIS — F3130 Bipolar disorder, current episode depressed, mild or moderate severity, unspecified: Secondary | ICD-10-CM | POA: Diagnosis not present

## 2017-07-05 DIAGNOSIS — I252 Old myocardial infarction: Secondary | ICD-10-CM | POA: Diagnosis not present

## 2017-07-05 DIAGNOSIS — E1151 Type 2 diabetes mellitus with diabetic peripheral angiopathy without gangrene: Secondary | ICD-10-CM | POA: Diagnosis not present

## 2017-07-07 DIAGNOSIS — I252 Old myocardial infarction: Secondary | ICD-10-CM | POA: Diagnosis not present

## 2017-07-07 DIAGNOSIS — I2510 Atherosclerotic heart disease of native coronary artery without angina pectoris: Secondary | ICD-10-CM | POA: Diagnosis not present

## 2017-07-07 DIAGNOSIS — M1991 Primary osteoarthritis, unspecified site: Secondary | ICD-10-CM | POA: Diagnosis not present

## 2017-07-07 DIAGNOSIS — E1151 Type 2 diabetes mellitus with diabetic peripheral angiopathy without gangrene: Secondary | ICD-10-CM | POA: Diagnosis not present

## 2017-07-07 DIAGNOSIS — I10 Essential (primary) hypertension: Secondary | ICD-10-CM | POA: Diagnosis not present

## 2017-07-07 DIAGNOSIS — F3130 Bipolar disorder, current episode depressed, mild or moderate severity, unspecified: Secondary | ICD-10-CM | POA: Diagnosis not present

## 2017-07-08 ENCOUNTER — Encounter: Payer: Self-pay | Admitting: Family Medicine

## 2017-07-08 ENCOUNTER — Ambulatory Visit (INDEPENDENT_AMBULATORY_CARE_PROVIDER_SITE_OTHER): Payer: Medicare Other | Admitting: Family Medicine

## 2017-07-08 VITALS — BP 140/84 | HR 75 | Temp 98.5°F | Resp 16 | Ht 60.0 in | Wt 129.5 lb

## 2017-07-08 DIAGNOSIS — E1149 Type 2 diabetes mellitus with other diabetic neurological complication: Secondary | ICD-10-CM

## 2017-07-08 DIAGNOSIS — G40909 Epilepsy, unspecified, not intractable, without status epilepticus: Secondary | ICD-10-CM | POA: Diagnosis not present

## 2017-07-08 DIAGNOSIS — G8929 Other chronic pain: Secondary | ICD-10-CM | POA: Diagnosis not present

## 2017-07-08 DIAGNOSIS — M545 Low back pain: Secondary | ICD-10-CM | POA: Diagnosis not present

## 2017-07-08 DIAGNOSIS — M549 Dorsalgia, unspecified: Secondary | ICD-10-CM | POA: Insufficient documentation

## 2017-07-08 DIAGNOSIS — R2681 Unsteadiness on feet: Secondary | ICD-10-CM

## 2017-07-08 DIAGNOSIS — R296 Repeated falls: Secondary | ICD-10-CM | POA: Insufficient documentation

## 2017-07-08 DIAGNOSIS — I2510 Atherosclerotic heart disease of native coronary artery without angina pectoris: Secondary | ICD-10-CM | POA: Diagnosis not present

## 2017-07-08 DIAGNOSIS — Z780 Asymptomatic menopausal state: Secondary | ICD-10-CM

## 2017-07-08 DIAGNOSIS — Z0000 Encounter for general adult medical examination without abnormal findings: Secondary | ICD-10-CM | POA: Diagnosis not present

## 2017-07-08 DIAGNOSIS — I69398 Other sequelae of cerebral infarction: Secondary | ICD-10-CM

## 2017-07-08 DIAGNOSIS — I10 Essential (primary) hypertension: Secondary | ICD-10-CM | POA: Diagnosis not present

## 2017-07-08 LAB — POCT GLYCOSYLATED HEMOGLOBIN (HGB A1C): Hemoglobin A1C: 5.5 % (ref 4.0–5.6)

## 2017-07-08 MED ORDER — TIZANIDINE HCL 4 MG PO TABS: 4.0000 mg | ORAL_TABLET | Freq: Three times a day (TID) | ORAL | 0 refills | Status: DC | PRN

## 2017-07-08 NOTE — Patient Instructions (Signed)
A few things to remember from today's visit:   Diabetes mellitus type 2 with neurological manifestations ([place]) - Plan: Microalbumin / creatinine urine ratio, POCT glycosylated hemoglobin (Hb A1C)  Unstable gait - Plan: Ambulatory referral to [facility]  Frequent falls - Plan: Ambulatory referral to [facility]  Asymptomatic postmenopausal estrogen deficiency - Plan: DG Bone Density  Hypertensive urgency  Seizure disorder as sequela of cerebrovascular accident ([place]) - Plan: Phenytoin Level, Total   Please be sure medication list is accurate. If a new problem present, please set up appointment sooner than planned today.

## 2017-07-08 NOTE — Assessment & Plan Note (Signed)
She needs more supervision, she is not following fall precautions, forgets to do so. HH referral placed. She will continue PT. Marland Kitchen

## 2017-07-08 NOTE — Assessment & Plan Note (Signed)
HgA1C at goal, 5.5. I recommend stopping Metformin and continue nonpharmacologic treatment. Healthy diet with avoidance of added sugar food intake is an important part of treatment and recommended. Annual eye exam and foot care recommended. F/U in 5-6 months

## 2017-07-08 NOTE — Assessment & Plan Note (Signed)
Several of her medications and some of her chronic medical problems increased risk of falls. Medications and side effects reviewed.

## 2017-07-08 NOTE — Assessment & Plan Note (Signed)
She has not had a seizure since 2016. No changes in Phenytoin ER 130 mg daily. She has not had phenytoin level since 05/2016, so ordered today. Recommend continuing following with Dr. Delice Lesch.

## 2017-07-08 NOTE — Progress Notes (Signed)
ACUTE VISIT   HPI:  Chief Complaint  Patient presents with  . Follow-up    [name] is a 72 y.o. female, who is here today with her daughter complaining of frequent falls, requesting PT orders as well as a home aide. Her daughter provides most of history.  I saw her in 12/2016, when she establish care.   She lives in a assisted living facility, [facility].  According to the daughter, she forgets she needs to use the walker and moving slowly because Hx of unstable gait, she gets up and walks fast, causing the fall. She is already receiving PT but according to the daughter, she needs a face to face visit. Last fall in 05/30/17, had head trauma,no LOC,     She is also requesting "home aid" to help her with baths. She states that assisted living she is now living in does not provide an aid or supervision.   Diabetes Mellitus II:   Since 2008. Currently on Metformin 500 mg twice daily. Daughter is upset because food she is eating. She has pictures: Pasta with  Checking BS's : She is not checking BS.  She is tolerating medications well. Negative for abdominal pain, nausea, vomiting, polydipsia, polyuria, or polyphagia.  Peripheral neuropathy, severe LE pain. According to her daughter, her cardiologist recommended stopping Naproxen.  She has been on Flexeril before, she has not taken any long time for lower back.  Her daughter is requesting a refill.   HTN: + Hx of CVA in 2015. + PAD.  According to the daughter some of her medications were changed by cardiologist, she is not sure which one. Currently she is on spironolactone 50 mg daily, Imdur 30 mg daily, amlodipine 10 mg daily, and metoprolol titrate 25 mg twice   daily. Lab Results  Component Value Date   CREATININE 0.79 06/13/2017   BUN 15 06/13/2017   NA 141 06/13/2017   K 4.5 06/13/2017   CL 101 06/13/2017   CO2 25 06/13/2017    Recently I was asked to refill her Phenytoin, which she  has been taking for years. She follows with Dr. Delice Lesch. According to the daughter, she was told to request refills from her PCP. She has not had a seizure in years. She had a stroke in 2015 associated with seizure.She has residual hemianopsia and memory issues. Last seizure in 2016.    Review of Systems  Constitutional: Positive for fatigue. Negative for activity change, appetite change and fever.  HENT: Negative for mouth sores and nosebleeds.   Eyes: Positive for visual disturbance. Negative for redness.  Respiratory: Negative for cough, shortness of breath and wheezing.   Cardiovascular: Negative for chest pain, palpitations and leg swelling.  Gastrointestinal: Negative for abdominal pain, nausea and vomiting.       Negative for changes in bowel habits.  Endocrine: Negative for polydipsia, polyphagia and polyuria.  Genitourinary: Negative for decreased urine volume, dysuria and hematuria.  Musculoskeletal: Positive for arthralgias, gait problem and myalgias.  Skin: Negative for rash and wound.  Neurological: Positive for dizziness. Negative for seizures, syncope, weakness and headaches.  Psychiatric/Behavioral: Positive for confusion. The patient is nervous/anxious.       Current Outpatient Medications on File Prior to Visit  Medication Sig Dispense Refill  . albuterol (PROVENTIL HFA;VENTOLIN HFA) 108 (90 Base) MCG/ACT inhaler Inhale 2 puffs into the lungs every 6 (six) hours as needed for wheezing or shortness of breath. (Patient taking differently: Inhale 2 puffs  into the lungs 4 (four) times daily as needed for wheezing or shortness of breath. ) 1 Inhaler 6  . amLODipine (NORVASC) 10 MG tablet Take 1 tablet (10 mg total) by mouth daily. 90 tablet 2  . aspirin EC 81 MG tablet Take 81 mg by mouth daily.    . Cholecalciferol (VITAMIN D) 2000 units CAPS Take 2,000 capsules by mouth daily.     . clopidogrel (PLAVIX) 75 MG tablet Take 1 tablet (75 mg total) by mouth daily. 90 tablet  2  . ezetimibe (ZETIA) 10 MG tablet Take 1 tablet (10 mg total) by mouth daily. 90 tablet 3  . ferrous sulfate 325 (65 FE) MG tablet Take 325 mg by mouth 2 (two) times daily.     . fluticasone (FLONASE) 50 MCG/ACT nasal spray Place 2 sprays into both nostrils daily. 16 g 6  . isosorbide mononitrate (IMDUR) 30 MG 24 hr tablet Take 30 mg by mouth daily.     Marland Kitchen lamoTRIgine (LAMICTAL) 200 MG tablet Take 300 mg by mouth See admin instructions. Take 1 1/2 tablets (300 mg) by mouth twice daily - 9am and 2pm    . metoprolol tartrate (LOPRESSOR) 25 MG tablet Take 1 tablet (25 mg total) by mouth 2 (two) times daily. (Patient taking differently: Take 25 mg by mouth 2 (two) times daily with a meal. ) 180 tablet 2  . naproxen (NAPROSYN) 500 MG tablet Take 1 tablet (500 mg total) by mouth 2 (two) times daily with a meal. 30 tablet 2  . Omega-3 Fatty Acids (FISH OIL) 1000 MG CAPS Take 2,000 mg by mouth daily.    . phenytoin (DILANTIN) 100 MG ER capsule Take 3 capsules (300 mg total) by mouth at bedtime. 270 capsule 0  . phenytoin (DILANTIN) 30 MG ER capsule Take 1 capsule (30 mg total) by mouth at bedtime. 90 capsule 0  . PRESCRIPTION MEDICATION Inhale into the lungs at bedtime. CPAP/BIPAP    . rosuvastatin (CRESTOR) 40 MG tablet Take 1 tablet (40 mg total) by mouth daily. 90 tablet 3  . Spacer/Aero Chamber Mouthpiece MISC 1 Device by Does not apply route daily as needed. 1 each 0  . spironolactone (ALDACTONE) 50 MG tablet Take 1 tablet (50 mg total) by mouth daily. 30 tablet 11  . furosemide (LASIX) 40 MG tablet Take 40 mg by mouth daily as needed (water retention).    . potassium chloride (K-DUR,KLOR-CON) 10 MEQ tablet Take 10 mEq by mouth daily as needed (with each dose of Lasix/furosemide).     No current facility-administered medications on file prior to visit.      Past Medical History:  Diagnosis Date  . Allergy   . Arthritis   . Bipolar 1 disorder ([place])   . Blood transfusion without reported  diagnosis   . Carotid artery stenosis    50-69% bilateral ICA stenoses by velocity criteria but no visible plaque and ICA/CCA ratio 1.72 on right and 1.38 on left  . Coronary artery disease   . Depression   . Diabetes mellitus without complication ([place])   . Hypertension   . Left adrenal mass ([facility])    2.5 cm L adrenal mass on CT per note of Dr. Seleta Rhymes 05/20/2014  . MI, old   . PAD (peripheral artery disease) ([place])    03/2014 - CT aortogram with lower extremity runoff (mild to moderate disease in common iliac arteries, complete occulusion of her bilateral external iliac arteries with heavily disease common femoral arteries. She also  has disease to her SFAs bilaterally. Popliteal arteries and tibial vessel runoff appears to be adequate)  . Seizures ([place])   . Stroke [facility])    Allergies  Allergen Reactions  . Amoxicillin Other (See Comments)    colitis  . Augmentin [Amoxicillin-Pot Clavulanate] Other (See Comments)    colitis  . Cephalexin Other (See Comments)    colitis  . Erythromycin Other (See Comments)    colitis  . Niacin And Related Other (See Comments)    colitis  . Cefadroxil Other (See Comments)    Reported by [facility] 2013 - unknown reaction  . Nitrofurantoin Macrocrystal Other (See Comments)    Reported by [facility] 2013 - unknown reaction    Social History   Socioeconomic History  . Marital status: Single    Spouse name: Not on file  . Number of children: Not on file  . Years of education: Not on file  . Highest education level: Not on file  Occupational History  . Not on file  Social Needs  . Financial resource strain: Not on file  . Food insecurity:    Worry: Not on file    Inability: Not on file  . Transportation needs:    Medical: Not on file    Non-medical: Not on file  Tobacco Use  . Smoking status: Former Smoker    Packs/day: 1.00    Types: Cigarettes    Last attempt to quit: 2015    Years since quitting: 4.4  . Smokeless tobacco: Never  Used  Substance and Sexual Activity  . Alcohol use: No  . Drug use: No  . Sexual activity: Not on file  Lifestyle  . Physical activity:    Days per week: Not on file    Minutes per session: Not on file  . Stress: Not on file  Relationships  . Social connections:    Talks on phone: Not on file    Gets together: Not on file    Attends religious service: Not on file    Active member of club or organization: Not on file    Attends meetings of clubs or organizations: Not on file    Relationship status: Not on file  Other Topics Concern  . Not on file  Social History Narrative   Pt lives in assisted living facility   Has 2 adult children   College   Worked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    Vitals:   07/08/17 1516  BP: 140/84  Pulse: 75  Resp: 16  Temp: 98.5 F (36.9 C)  SpO2: 98%   Body mass index is 25.29 kg/m.   Physical Exam  Nursing note and vitals reviewed. Constitutional: She appears well-developed and well-nourished. No distress.  HENT:  Head: Normocephalic and atraumatic.  Mouth/Throat: Oropharynx is clear and moist and mucous membranes are normal.  Eyes: Pupils are equal, round, and reactive to light. Conjunctivae are normal.  Cardiovascular: Normal rate and regular rhythm.  No murmur heard. DP pulses present,bilateral.  Respiratory: Effort normal and breath sounds normal. No respiratory distress.  GI: Soft. She exhibits no mass. There is no tenderness.  Musculoskeletal: She exhibits no edema.  Lymphadenopathy:    She has no cervical adenopathy.  Neurological: She is alert. She has normal strength. Gait abnormal. Coordination normal.  Oriented in person and place. She is in a wheelchair.  Skin: Skin is warm. No rash noted. No erythema.  Psychiatric: Her mood appears anxious.  Well groomed, good eye contact.  ASSESSMENT AND PLAN:  Ms. Angelli was seen today for follow-up.  Orders Placed This Encounter  Procedures  . DG Bone Density  . Phenytoin  Level, Total  . Microalbumin / creatinine urine ratio  . Ambulatory referral to Home Health  . POCT glycosylated hemoglobin (Hb A1C)   Lab Results  Component Value Date   HGBA1C 5.5 07/08/2017     Diabetes mellitus type 2 with neurological manifestations ([place]) HgA1C at goal, 5.5. I recommend stopping Metformin and continue nonpharmacologic treatment. Healthy diet with avoidance of added sugar food intake is an important part of treatment and recommended. Annual eye exam and foot care recommended. F/U in 5-6 months   Essential hypertension Otherwise BP adequately controlled. Recommend checking BP daily. No changes in current management. Follow-up in 5 to 6 months.  Seizure disorder as sequela of cerebrovascular accident [facility]) She has not had a seizure since 2016. No changes in Phenytoin ER 130 mg daily. She has not had phenytoin level since 05/2016, so ordered today. Recommend continuing following with Dr. Delice Lesch.  Unstable gait She needs more supervision, she is not following fall precautions, forgets to do so. HH referral placed. She will continue PT. .   Frequent falls Several of her medications and some of her chronic medical problems increased risk of falls. Medications and side effects reviewed.  Back pain Flexeril not to resume. Instead she will try Zabaflex, ideally at just at bedtime. Side effects discussed.   Healthcare maintenance  She and her daughter are not interested in mammogram. DEXA will be arranged. Vaccination: Reporting allergic reaction to pneumonia vaccine. She is due for eye exam. Colonoscopy per daughter report in 2013 in [place].   3:38 pm-4:21 pm  > 50% was dedicated to discussion of Dx, prognosis, treatment options, and some side effects of medications.      Return in about 6 months (around 01/07/2018) for 30 min.       Betty G. Martinique, MD  [facility]. [place] office.

## 2017-07-08 NOTE — Assessment & Plan Note (Signed)
Otherwise BP adequately controlled. Recommend checking BP daily. No changes in current management. Follow-up in 5 to 6 months.

## 2017-07-08 NOTE — Assessment & Plan Note (Signed)
Flexeril not to resume. Instead she will try Zabaflex, ideally at just at bedtime. Side effects discussed.

## 2017-07-09 LAB — MICROALBUMIN / CREATININE URINE RATIO
CREATININE, URINE: 145 mg/dL (ref 20–275)
Microalb Creat Ratio: 274 mcg/mg creat — ABNORMAL HIGH (ref <30)
Microalb, Ur: 39.7 mg/dL

## 2017-07-09 LAB — PHENYTOIN LEVEL, TOTAL: PHENYTOIN, TOTAL: 7.7 mg/L — AB (ref 10.0–20.0)

## 2017-07-11 DIAGNOSIS — I2510 Atherosclerotic heart disease of native coronary artery without angina pectoris: Secondary | ICD-10-CM | POA: Diagnosis not present

## 2017-07-11 DIAGNOSIS — F3130 Bipolar disorder, current episode depressed, mild or moderate severity, unspecified: Secondary | ICD-10-CM | POA: Diagnosis not present

## 2017-07-11 DIAGNOSIS — I252 Old myocardial infarction: Secondary | ICD-10-CM | POA: Diagnosis not present

## 2017-07-11 DIAGNOSIS — E1151 Type 2 diabetes mellitus with diabetic peripheral angiopathy without gangrene: Secondary | ICD-10-CM | POA: Diagnosis not present

## 2017-07-11 DIAGNOSIS — I10 Essential (primary) hypertension: Secondary | ICD-10-CM | POA: Diagnosis not present

## 2017-07-11 DIAGNOSIS — M1991 Primary osteoarthritis, unspecified site: Secondary | ICD-10-CM | POA: Diagnosis not present

## 2017-07-12 ENCOUNTER — Telehealth: Payer: Self-pay

## 2017-07-12 NOTE — Telephone Encounter (Signed)
Copied from [place] [phone]. Topic: Quick Communication - See Telephone Encounter >> Jul 12, 2017  2:12 PM Antonieta Iba C wrote: CRM for notification. See Telephone encounter for: 07/12/17.  Abigail Butts PT w/ [facility], they are suppose to have start of care with pt today, they are unable to reach pt.

## 2017-07-12 NOTE — Telephone Encounter (Signed)
Message sent to Dr. Jordan for review. 

## 2017-07-14 DIAGNOSIS — I2510 Atherosclerotic heart disease of native coronary artery without angina pectoris: Secondary | ICD-10-CM | POA: Diagnosis not present

## 2017-07-14 DIAGNOSIS — M1991 Primary osteoarthritis, unspecified site: Secondary | ICD-10-CM | POA: Diagnosis not present

## 2017-07-14 DIAGNOSIS — I10 Essential (primary) hypertension: Secondary | ICD-10-CM | POA: Diagnosis not present

## 2017-07-14 DIAGNOSIS — F3130 Bipolar disorder, current episode depressed, mild or moderate severity, unspecified: Secondary | ICD-10-CM | POA: Diagnosis not present

## 2017-07-14 DIAGNOSIS — E1151 Type 2 diabetes mellitus with diabetic peripheral angiopathy without gangrene: Secondary | ICD-10-CM | POA: Diagnosis not present

## 2017-07-14 DIAGNOSIS — I252 Old myocardial infarction: Secondary | ICD-10-CM | POA: Diagnosis not present

## 2017-07-15 ENCOUNTER — Telehealth: Payer: Self-pay | Admitting: Family Medicine

## 2017-07-15 NOTE — Telephone Encounter (Signed)
Copied from [place] [id number]. Topic: General - Other >> Jul 15, 2017 10:35 AM Yvette Rack wrote: Reason for CRM: Flor with [facility] requests an extension of verbal orders for Physical Therapy for 2 times a week for 3 weeks. Cb# [phone]

## 2017-07-15 NOTE — Telephone Encounter (Signed)
Message sent to Dr. Jordan for review and approval. 

## 2017-07-18 NOTE — Telephone Encounter (Signed)
Verbal authorization for PT can be given as requested. Thanks, BJ

## 2017-07-19 ENCOUNTER — Telehealth: Payer: Self-pay | Admitting: Family Medicine

## 2017-07-19 DIAGNOSIS — I2510 Atherosclerotic heart disease of native coronary artery without angina pectoris: Secondary | ICD-10-CM | POA: Diagnosis not present

## 2017-07-19 DIAGNOSIS — M1991 Primary osteoarthritis, unspecified site: Secondary | ICD-10-CM | POA: Diagnosis not present

## 2017-07-19 DIAGNOSIS — E1151 Type 2 diabetes mellitus with diabetic peripheral angiopathy without gangrene: Secondary | ICD-10-CM | POA: Diagnosis not present

## 2017-07-19 DIAGNOSIS — I10 Essential (primary) hypertension: Secondary | ICD-10-CM | POA: Diagnosis not present

## 2017-07-19 DIAGNOSIS — F3130 Bipolar disorder, current episode depressed, mild or moderate severity, unspecified: Secondary | ICD-10-CM | POA: Diagnosis not present

## 2017-07-19 DIAGNOSIS — I252 Old myocardial infarction: Secondary | ICD-10-CM | POA: Diagnosis not present

## 2017-07-19 NOTE — Telephone Encounter (Signed)
Spoke to [place] and advised to proceed with orders for PT.  No further action required.  Will close note.

## 2017-07-19 NOTE — Telephone Encounter (Signed)
Copied from [place] #[id number]. >> Jul 19, 2017 12:56 PM Cecelia Byars, NT wrote: Reason for CRM: Darlina Guys from [facility] would like the last office notes faxed to [phone] or call her at [phone], she will need this done by Friday or the patient will have to be dc from their service

## 2017-07-19 NOTE — Telephone Encounter (Signed)
We will need a signed release from patient and [facility] in order to fax records.  Will send to [facility] to keep an eye out for records to be faxed over.  Left detailed message on voicemail requesting signed release asap in order to fax records per [facility]

## 2017-07-19 NOTE — Telephone Encounter (Signed)
Copied from [place] [phone]. Topic: General - Other >> Jul 19, 2017 12:56 PM Cecelia Byars, NT wrote: Reason for CRM: Darlina Guys from [facility] would like the last office notes faxed to [phone] or call her at [phone], she will need this done by Friday or the patient will have to be dc from their service

## 2017-07-21 DIAGNOSIS — E1151 Type 2 diabetes mellitus with diabetic peripheral angiopathy without gangrene: Secondary | ICD-10-CM | POA: Diagnosis not present

## 2017-07-21 DIAGNOSIS — I2510 Atherosclerotic heart disease of native coronary artery without angina pectoris: Secondary | ICD-10-CM | POA: Diagnosis not present

## 2017-07-21 DIAGNOSIS — F3130 Bipolar disorder, current episode depressed, mild or moderate severity, unspecified: Secondary | ICD-10-CM | POA: Diagnosis not present

## 2017-07-21 DIAGNOSIS — M1991 Primary osteoarthritis, unspecified site: Secondary | ICD-10-CM | POA: Diagnosis not present

## 2017-07-21 DIAGNOSIS — I10 Essential (primary) hypertension: Secondary | ICD-10-CM | POA: Diagnosis not present

## 2017-07-21 DIAGNOSIS — I252 Old myocardial infarction: Secondary | ICD-10-CM | POA: Diagnosis not present

## 2017-07-26 DIAGNOSIS — F3130 Bipolar disorder, current episode depressed, mild or moderate severity, unspecified: Secondary | ICD-10-CM | POA: Diagnosis not present

## 2017-07-26 DIAGNOSIS — M1991 Primary osteoarthritis, unspecified site: Secondary | ICD-10-CM | POA: Diagnosis not present

## 2017-07-26 DIAGNOSIS — I2510 Atherosclerotic heart disease of native coronary artery without angina pectoris: Secondary | ICD-10-CM | POA: Diagnosis not present

## 2017-07-26 DIAGNOSIS — I252 Old myocardial infarction: Secondary | ICD-10-CM | POA: Diagnosis not present

## 2017-07-26 DIAGNOSIS — E1151 Type 2 diabetes mellitus with diabetic peripheral angiopathy without gangrene: Secondary | ICD-10-CM | POA: Diagnosis not present

## 2017-07-26 DIAGNOSIS — I10 Essential (primary) hypertension: Secondary | ICD-10-CM | POA: Diagnosis not present

## 2017-07-27 DIAGNOSIS — I10 Essential (primary) hypertension: Secondary | ICD-10-CM | POA: Diagnosis not present

## 2017-07-27 DIAGNOSIS — I252 Old myocardial infarction: Secondary | ICD-10-CM | POA: Diagnosis not present

## 2017-07-27 DIAGNOSIS — F3130 Bipolar disorder, current episode depressed, mild or moderate severity, unspecified: Secondary | ICD-10-CM | POA: Diagnosis not present

## 2017-07-27 DIAGNOSIS — I2510 Atherosclerotic heart disease of native coronary artery without angina pectoris: Secondary | ICD-10-CM | POA: Diagnosis not present

## 2017-07-27 DIAGNOSIS — E1151 Type 2 diabetes mellitus with diabetic peripheral angiopathy without gangrene: Secondary | ICD-10-CM | POA: Diagnosis not present

## 2017-07-27 DIAGNOSIS — M1991 Primary osteoarthritis, unspecified site: Secondary | ICD-10-CM | POA: Diagnosis not present

## 2017-07-29 ENCOUNTER — Telehealth: Payer: Self-pay | Admitting: *Deleted

## 2017-07-29 NOTE — Telephone Encounter (Signed)
Refill request for Dilantin 100 mg take 3 cap by mouth at bedtime, #90, 5 Last refilled: 07/03/17

## 2017-08-01 ENCOUNTER — Other Ambulatory Visit: Payer: Self-pay | Admitting: Family Medicine

## 2017-08-02 DIAGNOSIS — E1151 Type 2 diabetes mellitus with diabetic peripheral angiopathy without gangrene: Secondary | ICD-10-CM | POA: Diagnosis not present

## 2017-08-02 DIAGNOSIS — I10 Essential (primary) hypertension: Secondary | ICD-10-CM | POA: Diagnosis not present

## 2017-08-02 DIAGNOSIS — M1991 Primary osteoarthritis, unspecified site: Secondary | ICD-10-CM | POA: Diagnosis not present

## 2017-08-02 DIAGNOSIS — I252 Old myocardial infarction: Secondary | ICD-10-CM | POA: Diagnosis not present

## 2017-08-02 DIAGNOSIS — I2510 Atherosclerotic heart disease of native coronary artery without angina pectoris: Secondary | ICD-10-CM | POA: Diagnosis not present

## 2017-08-02 DIAGNOSIS — F3130 Bipolar disorder, current episode depressed, mild or moderate severity, unspecified: Secondary | ICD-10-CM | POA: Diagnosis not present

## 2017-08-02 NOTE — Telephone Encounter (Signed)
Rx for Dilantin was sent. Thanks, BJ

## 2017-08-05 DIAGNOSIS — I252 Old myocardial infarction: Secondary | ICD-10-CM | POA: Diagnosis not present

## 2017-08-05 DIAGNOSIS — E1151 Type 2 diabetes mellitus with diabetic peripheral angiopathy without gangrene: Secondary | ICD-10-CM | POA: Diagnosis not present

## 2017-08-05 DIAGNOSIS — F3130 Bipolar disorder, current episode depressed, mild or moderate severity, unspecified: Secondary | ICD-10-CM | POA: Diagnosis not present

## 2017-08-05 DIAGNOSIS — M1991 Primary osteoarthritis, unspecified site: Secondary | ICD-10-CM | POA: Diagnosis not present

## 2017-08-05 DIAGNOSIS — I10 Essential (primary) hypertension: Secondary | ICD-10-CM | POA: Diagnosis not present

## 2017-08-05 DIAGNOSIS — I2510 Atherosclerotic heart disease of native coronary artery without angina pectoris: Secondary | ICD-10-CM | POA: Diagnosis not present

## 2017-08-09 ENCOUNTER — Telehealth: Payer: Self-pay | Admitting: *Deleted

## 2017-08-09 NOTE — Telephone Encounter (Signed)
Message sent to Dr. Jordan for review and approval. 

## 2017-08-09 NOTE — Telephone Encounter (Signed)
Copied from [place] [phone]. Topic: Inquiry >> Aug 08, 2017  4:29 PM Oliver Pila B wrote: Reason for CRM: [facility] called to extend PT services b/c the pt had another fall w/ no injuries and a referral to OT contact [facility] to give verbals; [phone]

## 2017-08-10 DIAGNOSIS — F3130 Bipolar disorder, current episode depressed, mild or moderate severity, unspecified: Secondary | ICD-10-CM | POA: Diagnosis not present

## 2017-08-10 DIAGNOSIS — E1151 Type 2 diabetes mellitus with diabetic peripheral angiopathy without gangrene: Secondary | ICD-10-CM | POA: Diagnosis not present

## 2017-08-10 DIAGNOSIS — I2510 Atherosclerotic heart disease of native coronary artery without angina pectoris: Secondary | ICD-10-CM | POA: Diagnosis not present

## 2017-08-10 DIAGNOSIS — I10 Essential (primary) hypertension: Secondary | ICD-10-CM | POA: Diagnosis not present

## 2017-08-10 DIAGNOSIS — M1991 Primary osteoarthritis, unspecified site: Secondary | ICD-10-CM | POA: Diagnosis not present

## 2017-08-10 DIAGNOSIS — I252 Old myocardial infarction: Secondary | ICD-10-CM | POA: Diagnosis not present

## 2017-08-10 NOTE — Telephone Encounter (Signed)
He is okay to extend PT services and proceed with OT evaluation, verbal authorization can be given. Thanks, BJ

## 2017-08-10 NOTE — Telephone Encounter (Signed)
Spoke with Kathryn Lewis from [facility], gave verbal orders for PT and OT evaluation as requested per Dr. Martinique. Flor asked that written orders be faxed to [facility] where patient resides, informed her that I would get PCP to write orders and fax by the end of the day.

## 2017-08-12 NOTE — Telephone Encounter (Signed)
It is okay to fax written orders. Thanks, BJ

## 2017-08-12 NOTE — Telephone Encounter (Signed)
Written orders faxed to [facility] with confirmation.

## 2017-08-15 DIAGNOSIS — E1151 Type 2 diabetes mellitus with diabetic peripheral angiopathy without gangrene: Secondary | ICD-10-CM | POA: Diagnosis not present

## 2017-08-15 DIAGNOSIS — I252 Old myocardial infarction: Secondary | ICD-10-CM | POA: Diagnosis not present

## 2017-08-15 DIAGNOSIS — F3130 Bipolar disorder, current episode depressed, mild or moderate severity, unspecified: Secondary | ICD-10-CM | POA: Diagnosis not present

## 2017-08-15 DIAGNOSIS — I2510 Atherosclerotic heart disease of native coronary artery without angina pectoris: Secondary | ICD-10-CM | POA: Diagnosis not present

## 2017-08-15 DIAGNOSIS — I10 Essential (primary) hypertension: Secondary | ICD-10-CM | POA: Diagnosis not present

## 2017-08-15 DIAGNOSIS — M1991 Primary osteoarthritis, unspecified site: Secondary | ICD-10-CM | POA: Diagnosis not present

## 2017-08-16 DIAGNOSIS — I10 Essential (primary) hypertension: Secondary | ICD-10-CM | POA: Diagnosis not present

## 2017-08-16 DIAGNOSIS — M1991 Primary osteoarthritis, unspecified site: Secondary | ICD-10-CM | POA: Diagnosis not present

## 2017-08-16 DIAGNOSIS — H1131 Conjunctival hemorrhage, right eye: Secondary | ICD-10-CM | POA: Diagnosis not present

## 2017-08-16 DIAGNOSIS — E1151 Type 2 diabetes mellitus with diabetic peripheral angiopathy without gangrene: Secondary | ICD-10-CM | POA: Diagnosis not present

## 2017-08-16 DIAGNOSIS — I252 Old myocardial infarction: Secondary | ICD-10-CM | POA: Diagnosis not present

## 2017-08-16 DIAGNOSIS — I2510 Atherosclerotic heart disease of native coronary artery without angina pectoris: Secondary | ICD-10-CM | POA: Diagnosis not present

## 2017-08-16 DIAGNOSIS — F3130 Bipolar disorder, current episode depressed, mild or moderate severity, unspecified: Secondary | ICD-10-CM | POA: Diagnosis not present

## 2017-08-17 ENCOUNTER — Telehealth: Payer: Self-pay | Admitting: Family Medicine

## 2017-08-17 NOTE — Telephone Encounter (Signed)
Copied from [place] [id number]. Topic: Quick Communication - See Telephone Encounter >> Aug 17, 2017  8:36 AM Bea Graff, NT wrote: CRM for notification. See Telephone encounter for: 08/17/17. Mallory with [facility] needing verbal orders for OT for twice a week for 1 week. CB#: [phone]

## 2017-08-17 NOTE — Telephone Encounter (Signed)
Called Kathryn Lewis to give the OK for verbal orders per PCP left VM with pt's name and DOB left my name and call back number if needed.

## 2017-08-18 DIAGNOSIS — M1991 Primary osteoarthritis, unspecified site: Secondary | ICD-10-CM | POA: Diagnosis not present

## 2017-08-18 DIAGNOSIS — I252 Old myocardial infarction: Secondary | ICD-10-CM | POA: Diagnosis not present

## 2017-08-18 DIAGNOSIS — F3130 Bipolar disorder, current episode depressed, mild or moderate severity, unspecified: Secondary | ICD-10-CM | POA: Diagnosis not present

## 2017-08-18 DIAGNOSIS — I10 Essential (primary) hypertension: Secondary | ICD-10-CM | POA: Diagnosis not present

## 2017-08-18 DIAGNOSIS — I2510 Atherosclerotic heart disease of native coronary artery without angina pectoris: Secondary | ICD-10-CM | POA: Diagnosis not present

## 2017-08-18 DIAGNOSIS — E1151 Type 2 diabetes mellitus with diabetic peripheral angiopathy without gangrene: Secondary | ICD-10-CM | POA: Diagnosis not present

## 2017-08-19 DIAGNOSIS — I252 Old myocardial infarction: Secondary | ICD-10-CM | POA: Diagnosis not present

## 2017-08-19 DIAGNOSIS — I10 Essential (primary) hypertension: Secondary | ICD-10-CM | POA: Diagnosis not present

## 2017-08-19 DIAGNOSIS — I2510 Atherosclerotic heart disease of native coronary artery without angina pectoris: Secondary | ICD-10-CM | POA: Diagnosis not present

## 2017-08-19 DIAGNOSIS — F3130 Bipolar disorder, current episode depressed, mild or moderate severity, unspecified: Secondary | ICD-10-CM | POA: Diagnosis not present

## 2017-08-19 DIAGNOSIS — E1151 Type 2 diabetes mellitus with diabetic peripheral angiopathy without gangrene: Secondary | ICD-10-CM | POA: Diagnosis not present

## 2017-08-19 DIAGNOSIS — M1991 Primary osteoarthritis, unspecified site: Secondary | ICD-10-CM | POA: Diagnosis not present

## 2017-08-22 ENCOUNTER — Telehealth: Payer: Self-pay | Admitting: Family Medicine

## 2017-08-22 NOTE — Telephone Encounter (Signed)
Left message for Kathryn Lewis to give me a call back for verbal orders.

## 2017-08-22 NOTE — Telephone Encounter (Signed)
Copied from [place] [phone]. Topic: Quick Communication - See Telephone Encounter >> Aug 22, 2017  9:00 AM Synthia Innocent wrote: CRM for notification. See Telephone encounter for: 08/22/17. [facility] calling requesting verbal orders, OT 1x a week for 1 week, 2x a week for 5 weeks effective 08/19/17

## 2017-08-23 DIAGNOSIS — F3130 Bipolar disorder, current episode depressed, mild or moderate severity, unspecified: Secondary | ICD-10-CM | POA: Diagnosis not present

## 2017-08-23 DIAGNOSIS — I252 Old myocardial infarction: Secondary | ICD-10-CM | POA: Diagnosis not present

## 2017-08-23 DIAGNOSIS — I10 Essential (primary) hypertension: Secondary | ICD-10-CM | POA: Diagnosis not present

## 2017-08-23 DIAGNOSIS — M1991 Primary osteoarthritis, unspecified site: Secondary | ICD-10-CM | POA: Diagnosis not present

## 2017-08-23 DIAGNOSIS — I2510 Atherosclerotic heart disease of native coronary artery without angina pectoris: Secondary | ICD-10-CM | POA: Diagnosis not present

## 2017-08-23 DIAGNOSIS — E1151 Type 2 diabetes mellitus with diabetic peripheral angiopathy without gangrene: Secondary | ICD-10-CM | POA: Diagnosis not present

## 2017-08-23 NOTE — Telephone Encounter (Signed)
Spoke with Kathryn Lewis, gave verbal orders for OT as requested. Kathryn Lewis stated that she maybe calling back tomorrow to get orders for medical equipment once she see patient.

## 2017-08-25 DIAGNOSIS — E1151 Type 2 diabetes mellitus with diabetic peripheral angiopathy without gangrene: Secondary | ICD-10-CM | POA: Diagnosis not present

## 2017-08-25 DIAGNOSIS — I2510 Atherosclerotic heart disease of native coronary artery without angina pectoris: Secondary | ICD-10-CM | POA: Diagnosis not present

## 2017-08-25 DIAGNOSIS — I252 Old myocardial infarction: Secondary | ICD-10-CM | POA: Diagnosis not present

## 2017-08-25 DIAGNOSIS — M1991 Primary osteoarthritis, unspecified site: Secondary | ICD-10-CM | POA: Diagnosis not present

## 2017-08-25 DIAGNOSIS — I10 Essential (primary) hypertension: Secondary | ICD-10-CM | POA: Diagnosis not present

## 2017-08-25 DIAGNOSIS — F3130 Bipolar disorder, current episode depressed, mild or moderate severity, unspecified: Secondary | ICD-10-CM | POA: Diagnosis not present

## 2017-08-29 ENCOUNTER — Other Ambulatory Visit: Payer: Medicare Other

## 2017-08-29 DIAGNOSIS — I252 Old myocardial infarction: Secondary | ICD-10-CM | POA: Diagnosis not present

## 2017-08-29 DIAGNOSIS — M1991 Primary osteoarthritis, unspecified site: Secondary | ICD-10-CM | POA: Diagnosis not present

## 2017-08-29 DIAGNOSIS — I10 Essential (primary) hypertension: Secondary | ICD-10-CM | POA: Diagnosis not present

## 2017-08-29 DIAGNOSIS — I2510 Atherosclerotic heart disease of native coronary artery without angina pectoris: Secondary | ICD-10-CM | POA: Diagnosis not present

## 2017-08-29 DIAGNOSIS — F3130 Bipolar disorder, current episode depressed, mild or moderate severity, unspecified: Secondary | ICD-10-CM | POA: Diagnosis not present

## 2017-08-29 DIAGNOSIS — E1151 Type 2 diabetes mellitus with diabetic peripheral angiopathy without gangrene: Secondary | ICD-10-CM | POA: Diagnosis not present

## 2017-09-01 DIAGNOSIS — M1991 Primary osteoarthritis, unspecified site: Secondary | ICD-10-CM | POA: Diagnosis not present

## 2017-09-01 DIAGNOSIS — E1151 Type 2 diabetes mellitus with diabetic peripheral angiopathy without gangrene: Secondary | ICD-10-CM | POA: Diagnosis not present

## 2017-09-01 DIAGNOSIS — I252 Old myocardial infarction: Secondary | ICD-10-CM | POA: Diagnosis not present

## 2017-09-01 DIAGNOSIS — I10 Essential (primary) hypertension: Secondary | ICD-10-CM | POA: Diagnosis not present

## 2017-09-01 DIAGNOSIS — F3130 Bipolar disorder, current episode depressed, mild or moderate severity, unspecified: Secondary | ICD-10-CM | POA: Diagnosis not present

## 2017-09-01 DIAGNOSIS — I2510 Atherosclerotic heart disease of native coronary artery without angina pectoris: Secondary | ICD-10-CM | POA: Diagnosis not present

## 2017-09-06 DIAGNOSIS — I252 Old myocardial infarction: Secondary | ICD-10-CM | POA: Diagnosis not present

## 2017-09-06 DIAGNOSIS — I2510 Atherosclerotic heart disease of native coronary artery without angina pectoris: Secondary | ICD-10-CM | POA: Diagnosis not present

## 2017-09-06 DIAGNOSIS — I10 Essential (primary) hypertension: Secondary | ICD-10-CM | POA: Diagnosis not present

## 2017-09-06 DIAGNOSIS — F3130 Bipolar disorder, current episode depressed, mild or moderate severity, unspecified: Secondary | ICD-10-CM | POA: Diagnosis not present

## 2017-09-06 DIAGNOSIS — M1991 Primary osteoarthritis, unspecified site: Secondary | ICD-10-CM | POA: Diagnosis not present

## 2017-09-06 DIAGNOSIS — E1151 Type 2 diabetes mellitus with diabetic peripheral angiopathy without gangrene: Secondary | ICD-10-CM | POA: Diagnosis not present

## 2017-09-07 ENCOUNTER — Other Ambulatory Visit: Payer: Self-pay | Admitting: Family Medicine

## 2017-09-09 ENCOUNTER — Other Ambulatory Visit: Payer: Medicare Other | Admitting: *Deleted

## 2017-09-09 ENCOUNTER — Telehealth: Payer: Self-pay | Admitting: Internal Medicine

## 2017-09-09 DIAGNOSIS — E782 Mixed hyperlipidemia: Secondary | ICD-10-CM

## 2017-09-09 LAB — LIPID PANEL
CHOLESTEROL TOTAL: 183 mg/dL (ref 100–199)
Chol/HDL Ratio: 2.8 ratio (ref 0.0–4.4)
HDL: 66 mg/dL (ref >39)
LDL Calculated: 85 mg/dL (ref 0–99)
TRIGLYCERIDES: 160 mg/dL — AB (ref 0–149)
VLDL CHOLESTEROL CAL: 32 mg/dL (ref 5–40)

## 2017-09-09 LAB — HEPATIC FUNCTION PANEL
ALT: 33 IU/L — AB (ref 0–32)
AST: 24 IU/L (ref 0–40)
Albumin: 4.5 g/dL (ref 3.5–4.8)
Alkaline Phosphatase: 134 IU/L — ABNORMAL HIGH (ref 39–117)
Bilirubin, Direct: 0.08 mg/dL (ref 0.00–0.40)
Total Protein: 6.6 g/dL (ref 6.0–8.5)

## 2017-09-09 NOTE — Telephone Encounter (Signed)
Walk In pt Form-Med List/BP readings dropped off. Placed in [place] doc box.

## 2017-09-12 DIAGNOSIS — I2510 Atherosclerotic heart disease of native coronary artery without angina pectoris: Secondary | ICD-10-CM | POA: Diagnosis not present

## 2017-09-12 DIAGNOSIS — I10 Essential (primary) hypertension: Secondary | ICD-10-CM | POA: Diagnosis not present

## 2017-09-12 DIAGNOSIS — M1991 Primary osteoarthritis, unspecified site: Secondary | ICD-10-CM | POA: Diagnosis not present

## 2017-09-12 DIAGNOSIS — F3130 Bipolar disorder, current episode depressed, mild or moderate severity, unspecified: Secondary | ICD-10-CM | POA: Diagnosis not present

## 2017-09-12 DIAGNOSIS — I252 Old myocardial infarction: Secondary | ICD-10-CM | POA: Diagnosis not present

## 2017-09-12 DIAGNOSIS — E1151 Type 2 diabetes mellitus with diabetic peripheral angiopathy without gangrene: Secondary | ICD-10-CM | POA: Diagnosis not present

## 2017-09-13 ENCOUNTER — Telehealth: Payer: Self-pay | Admitting: Pharmacist

## 2017-09-13 NOTE — Telephone Encounter (Signed)
Spoke with pt to discuss cholesterol results - LDL slightly increased since changing from atorvastatin to rosuvastatin. Pt reports adherence to rosuvastatin 40mg  daily and ezetimibe 10mg  daily. She reports food choices are all unhealthy at [facility] where she lives and that she is unable to make healthier food choices.  Discussed scheduling f/u pharmacy visit since pt was supposed to follow up in HTN clinic earlier this year but did not show, as well as to discuss PCSK9i therapy. She states she would need to check her schedule and will call us back to schedule this appt.

## 2017-09-21 ENCOUNTER — Other Ambulatory Visit: Payer: Self-pay | Admitting: *Deleted

## 2017-09-21 ENCOUNTER — Telehealth: Payer: Self-pay | Admitting: *Deleted

## 2017-09-21 MED ORDER — PHENYTOIN SODIUM EXTENDED 30 MG PO CAPS: 30.0000 mg | ORAL_CAPSULE | Freq: Every day | ORAL | 0 refills | Status: DC

## 2017-09-22 NOTE — Telephone Encounter (Signed)
Copied from [place] [id number]. Topic: Quick Communication - See Telephone Encounter >> Sep 22, 2017  2:09 PM Hewitt Shorts wrote: Levada Dy from [facility] is calling to request that medication clarification sheet be faxed back as soon as possible   Best number [phone]

## 2017-09-23 NOTE — Telephone Encounter (Signed)
Form placed on doctors desk for completion and signature. PCP out of office until Tuesday, 09/27/17.

## 2017-09-28 NOTE — Telephone Encounter (Signed)
Form faxed to [facility] with confirmation.

## 2017-09-29 ENCOUNTER — Ambulatory Visit
Admission: RE | Admit: 2017-09-29 | Discharge: 2017-09-29 | Disposition: A | Discharge status: Home / Self Care | Payer: Medicare Other | Source: Ambulatory Visit | Attending: Family Medicine | Admitting: Family Medicine

## 2017-09-29 DIAGNOSIS — Z780 Asymptomatic menopausal state: Secondary | ICD-10-CM | POA: Diagnosis not present

## 2017-09-29 DIAGNOSIS — M85851 Other specified disorders of bone density and structure, right thigh: Secondary | ICD-10-CM | POA: Diagnosis not present

## 2017-10-28 ENCOUNTER — Telehealth: Payer: Self-pay | Admitting: Family Medicine

## 2017-10-28 NOTE — Telephone Encounter (Signed)
Message sent to Dr. Jordan for review and approval. 

## 2017-10-28 NOTE — Telephone Encounter (Signed)
Copied from [place] [phone]. Topic: Quick Communication - See Telephone Encounter >> Oct 28, 2017  2:04 PM Conception Chancy, NT wrote: CRM for notification. See Telephone encounter for: 10/28/17.  Patient is calling and requesting a doctor order for a [place] Safety Ring. She states they do not allow bed rails in the assisted living but the Ring will help her get into her wheel chair.

## 2017-10-31 NOTE — Telephone Encounter (Signed)
It is okay to give verbal authorization for a hello safety ring as requested. Hopefully thiscan help to decrease risk for falls. Thanks, BJ

## 2017-10-31 NOTE — Telephone Encounter (Signed)
Written order for Hello Safety Ring faxed to [facility] with confirmation.

## 2017-11-16 ENCOUNTER — Telehealth: Payer: Self-pay | Admitting: Family Medicine

## 2017-11-16 NOTE — Telephone Encounter (Signed)
Message sent to Dr. Jordan for review and approval. 

## 2017-11-16 NOTE — Telephone Encounter (Signed)
Copied from [place] [id number]. Topic: Quick Communication - See Telephone Encounter >> Nov 16, 2017 10:50 AM Ahmed Prima L wrote: CRM for notification. See Telephone encounter for: 11/16/17.  [name], daughter is calling to see if she can get an order for palliative care, the reason being is that she has declined in health. She has had several falls, extreme leg pain.  Call back @ [phone]. Her cardiologist is Dr Harrington Challenger.

## 2017-11-17 NOTE — Telephone Encounter (Signed)
Pt daughter, Kathryn Lewis, calling back to check status of order. She wanted to leave fax number for both hospice and [facility].    Hospice fax: [phone]  [facility]: [phone]

## 2017-11-18 ENCOUNTER — Other Ambulatory Visit: Payer: Self-pay | Admitting: Family Medicine

## 2017-11-18 DIAGNOSIS — I69398 Other sequelae of cerebral infarction: Principal | ICD-10-CM

## 2017-11-18 DIAGNOSIS — R2681 Unsteadiness on feet: Secondary | ICD-10-CM

## 2017-11-18 DIAGNOSIS — J449 Chronic obstructive pulmonary disease, unspecified: Secondary | ICD-10-CM

## 2017-11-18 DIAGNOSIS — G40909 Epilepsy, unspecified, not intractable, without status epilepticus: Secondary | ICD-10-CM

## 2017-11-18 NOTE — Telephone Encounter (Signed)
Referral placed. Palliative evaluation could be provided by same agency that is provided home health services. Thanks, BJ

## 2017-11-18 NOTE — Telephone Encounter (Signed)
Spoke with daughter [name] and informed her that the referral was placed as requested.

## 2017-12-18 ENCOUNTER — Emergency Department (HOSPITAL_COMMUNITY): Payer: Medicare Other

## 2017-12-18 ENCOUNTER — Emergency Department (HOSPITAL_COMMUNITY)
Admission: EM | Admit: 2017-12-18 | Discharge: 2017-12-18 | Disposition: A | Discharge status: Home / Self Care | Payer: Medicare Other | Attending: Emergency Medicine | Admitting: Emergency Medicine

## 2017-12-18 ENCOUNTER — Other Ambulatory Visit: Payer: Self-pay

## 2017-12-18 ENCOUNTER — Encounter (HOSPITAL_COMMUNITY): Payer: Self-pay

## 2017-12-18 DIAGNOSIS — Y999 Unspecified external cause status: Secondary | ICD-10-CM | POA: Diagnosis not present

## 2017-12-18 DIAGNOSIS — Z87891 Personal history of nicotine dependence: Secondary | ICD-10-CM | POA: Insufficient documentation

## 2017-12-18 DIAGNOSIS — Z79899 Other long term (current) drug therapy: Secondary | ICD-10-CM | POA: Insufficient documentation

## 2017-12-18 DIAGNOSIS — S199XXA Unspecified injury of neck, initial encounter: Secondary | ICD-10-CM | POA: Diagnosis not present

## 2017-12-18 DIAGNOSIS — Z7982 Long term (current) use of aspirin: Secondary | ICD-10-CM | POA: Insufficient documentation

## 2017-12-18 DIAGNOSIS — J449 Chronic obstructive pulmonary disease, unspecified: Secondary | ICD-10-CM | POA: Insufficient documentation

## 2017-12-18 DIAGNOSIS — R4182 Altered mental status, unspecified: Secondary | ICD-10-CM | POA: Diagnosis not present

## 2017-12-18 DIAGNOSIS — E119 Type 2 diabetes mellitus without complications: Secondary | ICD-10-CM | POA: Diagnosis not present

## 2017-12-18 DIAGNOSIS — R350 Frequency of micturition: Secondary | ICD-10-CM | POA: Diagnosis not present

## 2017-12-18 DIAGNOSIS — I10 Essential (primary) hypertension: Secondary | ICD-10-CM | POA: Diagnosis not present

## 2017-12-18 DIAGNOSIS — S0990XA Unspecified injury of head, initial encounter: Secondary | ICD-10-CM | POA: Diagnosis not present

## 2017-12-18 DIAGNOSIS — Y92129 Unspecified place in nursing home as the place of occurrence of the external cause: Secondary | ICD-10-CM | POA: Diagnosis not present

## 2017-12-18 DIAGNOSIS — Y939 Activity, unspecified: Secondary | ICD-10-CM | POA: Diagnosis not present

## 2017-12-18 DIAGNOSIS — W19XXXA Unspecified fall, initial encounter: Secondary | ICD-10-CM | POA: Insufficient documentation

## 2017-12-18 LAB — URINALYSIS, ROUTINE W REFLEX MICROSCOPIC
Bilirubin Urine: NEGATIVE
Glucose, UA: NEGATIVE mg/dL
Hgb urine dipstick: NEGATIVE
Ketones, ur: NEGATIVE mg/dL
Nitrite: NEGATIVE
Protein, ur: 30 mg/dL — AB
Specific Gravity, Urine: 1.011 (ref 1.005–1.030)
pH: 6 (ref 5.0–8.0)

## 2017-12-18 LAB — BASIC METABOLIC PANEL WITH GFR
Anion gap: 10 (ref 5–15)
BUN: 14 mg/dL (ref 8–23)
CO2: 25 mmol/L (ref 22–32)
Calcium: 10.1 mg/dL (ref 8.9–10.3)
Chloride: 103 mmol/L (ref 98–111)
Creatinine, Ser: 0.86 mg/dL (ref 0.44–1.00)
GFR calc Af Amer: >60 mL/min
GFR calc non Af Amer: >60 mL/min
Glucose, Bld: 156 mg/dL — ABNORMAL HIGH (ref 70–99)
Potassium: 4.3 mmol/L (ref 3.5–5.1)
Sodium: 138 mmol/L (ref 135–145)

## 2017-12-18 LAB — CBC WITH DIFFERENTIAL/PLATELET
Abs Immature Granulocytes: 0.15 10*3/uL — ABNORMAL HIGH (ref 0.00–0.07)
BASOS ABS: 0 10*3/uL (ref 0.0–0.1)
BASOS PCT: 1 %
EOS ABS: 0.2 10*3/uL (ref 0.0–0.5)
EOS PCT: 2 %
HCT: 41.7 % (ref 36.0–46.0)
Hemoglobin: 13.1 g/dL (ref 12.0–15.0)
Immature Granulocytes: 2 %
Lymphocytes Relative: 18 %
Lymphs Abs: 1.6 10*3/uL (ref 0.7–4.0)
MCH: 27.9 pg (ref 26.0–34.0)
MCHC: 31.4 g/dL (ref 30.0–36.0)
MCV: 88.9 fL (ref 80.0–100.0)
MONO ABS: 0.6 10*3/uL (ref 0.1–1.0)
Monocytes Relative: 7 %
NRBC: 0 % (ref 0.0–0.2)
Neutro Abs: 6.2 10*3/uL (ref 1.7–7.7)
Neutrophils Relative %: 70 %
PLATELETS: 281 10*3/uL (ref 150–400)
RBC: 4.69 MIL/uL (ref 3.87–5.11)
RDW: 13.3 % (ref 11.5–15.5)
WBC: 8.8 10*3/uL (ref 4.0–10.5)

## 2017-12-18 MED ORDER — CIPROFLOXACIN HCL 250 MG PO TABS: 250.0000 mg | ORAL_TABLET | Freq: Two times a day (BID) | ORAL | 0 refills | Status: DC

## 2017-12-18 NOTE — ED Notes (Signed)
E-signature not available, pt verbalized understanding of DC instructions and prescriptions 

## 2017-12-18 NOTE — Discharge Instructions (Addendum)
There were no serious problems seen on the testing today.  There is arthritis in the cervical spine and old strokes seen on the head imaging.  She may have some respiratory abnormalities of a chronic nature that can be followed up as an outpatient if needed.  Since she has urinary frequency, we are prescribing an antibiotic to treat for possible urinary tract infection.  Urine culture was sent.  See your doctor next week for a checkup.

## 2017-12-18 NOTE — ED Provider Notes (Signed)
[facility] [place] EMERGENCY DEPARTMENT Provider Note   CSN: [id number] Arrival date & time: 12/18/17  1520     History   Chief Complaint Chief Complaint  Patient presents with  . Fall    HPI Kathryn Lewis is a 72 y.o. female.  HPI   She presents for evaluation of injury from fall.  Patient typically forgets that she is not supposed to walk, gets out of her wheelchair and falls.  Apparently this is what happened today at her assisted living facility.  She complains only of pain in the back of her head.  Here with a family member who helps to give history and I talked to another family member on the telephone.  The patient has not had any recent illnesses.  There are no other known modifying factors.  Level 5 caveat-altered mental status  Past Medical History:  Diagnosis Date  . Allergy   . Arthritis   . Bipolar 1 disorder ([place])   . Blood transfusion without reported diagnosis   . Carotid artery stenosis    50-69% bilateral ICA stenoses by velocity criteria but no visible plaque and ICA/CCA ratio 1.72 on right and 1.38 on left  . Coronary artery disease   . Depression   . Diabetes mellitus without complication ([place])   . Hypertension   . Left adrenal mass ([facility])    2.5 cm L adrenal mass on CT per note of Dr. Seleta Rhymes 05/20/2014  . MI, old   . PAD (peripheral artery disease) ([place])    03/2014 - CT aortogram with lower extremity runoff (mild to moderate disease in common iliac arteries, complete occulusion of her bilateral external iliac arteries with heavily disease common femoral arteries. She also has disease to her SFAs bilaterally. Popliteal arteries and tibial vessel runoff appears to be adequate)  . Seizures ([place])   . Stroke [facility])     Patient Active Problem List   Diagnosis Date Noted  . Frequent falls 07/08/2017  . Back pain 07/08/2017  . Mixed hyperlipidemia 05/27/2017  . Unstable gait 01/10/2017  . Hyperlipidemia associated with type 2 diabetes  mellitus ([place]) 01/10/2017  . OSA on CPAP 12/24/2016  . COPD (chronic obstructive pulmonary disease) ([place]) 12/24/2016  . Pulmonary nodule 12/24/2016  . Generalized weakness 12/12/2015  . Diabetes mellitus type 2 with neurological manifestations ([place]) 12/12/2015  . Essential hypertension 12/12/2015  . Hypertensive urgency 04/17/2015  . Seizure disorder as sequela of cerebrovascular accident ([place]) 04/17/2015  . CAD (coronary artery disease) 04/17/2015    Past Surgical History:  Procedure Laterality Date  . BREAST SURGERY  2012   small lump nonmilignant  . NECK SURGERY       OB History   None      Home Medications    Prior to Admission medications   Medication Sig Start Date End Date Taking? Authorizing Provider  albuterol (PROVENTIL HFA;VENTOLIN HFA) 108 (90 Base) MCG/ACT inhaler Inhale 2 puffs into the lungs every 6 (six) hours as needed for wheezing or shortness of breath. Patient taking differently: Inhale 2 puffs into the lungs 4 (four) times daily as needed for wheezing or shortness of breath.  12/24/16   Rigoberto Noel, MD  amLODipine (NORVASC) 10 MG tablet Take 1 tablet (10 mg total) by mouth daily. 01/10/17   Martinique, Betty G, MD  aspirin EC 81 MG tablet Take 81 mg by mouth daily.    [provider]  Cholecalciferol (VITAMIN D) 2000 units CAPS Take 2,000 capsules by  mouth daily.     [provider]  ciprofloxacin (CIPRO) 250 MG tablet Take 1 tablet (250 mg total) by mouth every 12 (twelve) hours. 12/18/17   Daleen Bo, MD  clopidogrel (PLAVIX) 75 MG tablet Take 1 tablet (75 mg total) by mouth daily. 01/10/17   [place], Betty G, MD  ezetimibe (ZETIA) 10 MG tablet Take 1 tablet (10 mg total) by mouth daily. 06/22/17   Martin[place], Betty G, MD  ferrous sulfate 325 (65 FE) MG tablet Take 325 mg by mouth 2 (two) times daily.     [provider]  fluticasone (FLONASE) 50 MCG/ACT nasal spray Place 2 sprays into both nostrils daily. 04/22/17   Nafziger, Tommi Rumps,  NP  furosemide (LASIX) 40 MG tablet Take 40 mg by mouth daily as needed (water retention).    [provider]  isosorbide mononitrate (IMDUR) 30 MG 24 hr tablet Take 30 mg by mouth daily.     [provider]  lamoTRIgine (LAMICTAL) 200 MG tablet Take 300 mg by mouth See admin instructions. Take 1 1/2 tablets (300 mg) by mouth twice daily - 9am and 2pm    [provider]  metoprolol tartrate (LOPRESSOR) 25 MG tablet Take 1 tablet (25 mg total) by mouth 2 (two) times daily. Patient taking differently: Take 25 mg by mouth 2 (two) times daily with a meal.  01/10/17   [place], Betty G, MD  naproxen (NAPROSYN) 500 MG tablet Take 1 tablet (500 mg total) by mouth 2 (two) times daily with a meal. 01/07/17   Young, Vanessa [place], PA-C  Omega-3 Fatty Acids (FISH OIL) 1000 MG CAPS Take 2,000 mg by mouth daily.    [provider]  phenytoin (DILANTIN) 100 MG ER capsule T 3 CAPS (300MG ) PO HS ND 09/07/17   Martinique, Betty G, MD  phenytoin (DILANTIN) 30 MG ER capsule Take 1 capsule (30 mg total) by mouth at bedtime. 09/21/17   [place], Betty G, MD  potassium chloride (K-DUR,KLOR-CON) 10 MEQ tablet Take 10 mEq by mouth daily as needed (with each dose of Lasix/furosemide).    [provider]  PRESCRIPTION MEDICATION Inhale into the lungs at bedtime. CPAP/BIPAP    [provider]  rosuvastatin (CRESTOR) 40 MG tablet Take 1 tablet (40 mg total) by mouth daily. 05/27/17 08/25/17  Fay Records, MD  Spacer/Aero Chamber Mouthpiece MISC 1 Device by Does not apply route daily as needed. 12/24/16   Rigoberto Noel, MD  spironolactone (ALDACTONE) 50 MG tablet Take 1 tablet (50 mg total) by mouth daily. 05/27/17   Fay Records, MD  tiZANidine (ZANAFLEX) 4 MG tablet Take 1 tablet (4 mg total) by mouth every 8 (eight) hours as needed for muscle spasms. 07/08/17   Martinique, Betty G, MD    Family History Family History  Problem Relation Age of Onset  . Hypertension Mother   . Arthritis  Mother   . Diabetes Mother   . Early death Mother   . Hyperlipidemia Mother   . Stroke Mother   . Hypertension Father   . Alcohol abuse Father   . Early death Father   . Hyperlipidemia Father   . COPD Father   . Heart attack Father   . Hypertension Sister   . Birth defects Brother   . Depression Brother   . Hyperlipidemia Brother   . Hypertension Brother   . Arthritis Daughter   . Asthma Daughter   . COPD Daughter   . Arthritis Son   .  COPD Son   . Hypertension Son   . Hyperlipidemia Son   . Heart attack Son   . Arthritis Maternal Grandmother   . Cancer Maternal Grandmother     Social History Social History   Tobacco Use  . Smoking status: Former Smoker    Packs/day: 1.00    Types: Cigarettes    Last attempt to quit: 2015    Years since quitting: 4.9  . Smokeless tobacco: Never Used  Substance Use Topics  . Alcohol use: No  . Drug use: No     Allergies   Amoxicillin; Augmentin [amoxicillin-pot clavulanate]; Cephalexin; Erythromycin; Niacin and related; Cefadroxil; and Nitrofurantoin macrocrystal   Review of Systems Review of Systems  Unable to perform ROS: Mental status change     Physical Exam Updated Vital Signs BP (!) 187/81   Pulse 79   Temp 98.6 F (37 C) (Oral)   Resp 17   Ht 5\' 1"  (1.549 m)   Wt 58.1 kg   LMP 03/17/2015   SpO2 97%   BMI 24.19 kg/m   Physical Exam  Constitutional: She is oriented to person, place, and time. She appears well-developed. No distress.  Elderly, frail  HENT:  Head: Normocephalic and atraumatic.  Right Ear: External ear normal.  Left Ear: External ear normal.  Scalp without crepitation, deformity or areas of focal tenderness.  Eyes: Pupils are equal, round, and reactive to light. Conjunctivae and EOM are normal.  Neck: Normal range of motion and phonation normal. Neck supple.  Cardiovascular: Normal rate, regular rhythm and normal heart sounds.  Pulmonary/Chest: Effort normal and breath sounds normal. She  exhibits no bony tenderness.  Abdominal: Soft. There is no tenderness.  Musculoskeletal: Normal range of motion. She exhibits no edema, tenderness or deformity.  Normal range of motion neck and upper back.  Negative straight leg raising bilaterally.  No deformity of the large joints of the arms or legs bilaterally.  Neurological: She is alert and oriented to person, place, and time. No cranial nerve deficit or sensory deficit. She exhibits normal muscle tone. Coordination normal.  Skin: Skin is warm, dry and intact.  Psychiatric: She has a normal mood and affect. Her behavior is normal. Judgment and thought content normal.  Nursing note and vitals reviewed.    ED Treatments / Results  Labs (all labs ordered are listed, but only abnormal results are displayed) Labs Reviewed  BASIC METABOLIC PANEL - Abnormal; Notable for the following components:      Result Value   Glucose, Bld 156 (*)    All other components within normal limits  CBC WITH DIFFERENTIAL/PLATELET - Abnormal; Notable for the following components:   Abs Immature Granulocytes 0.15 (*)    All other components within normal limits  URINALYSIS, ROUTINE W REFLEX MICROSCOPIC - Abnormal; Notable for the following components:   Protein, ur 30 (*)    Leukocytes, UA LARGE (*)    Bacteria, UA RARE (*)    All other components within normal limits  URINE CULTURE    EKG EKG Interpretation  Date/Time:  Sunday December 18 2017 15:52:27 EST Ventricular Rate:  74 PR Interval:    QRS Duration: 97 QT Interval:  434 QTC Calculation: 482 R Axis:   79 Text Interpretation:  Sinus rhythm Consider left atrial enlargement Nonspecific repol abnormality, diffuse leads since last tracing no significant change Confirmed by Daleen Bo [phone]) on 12/18/2017 4:04:20 PM   Radiology Ct Head Wo Contrast  Result Date: 12/18/2017 CLINICAL DATA:  3 falls  this week, most recently striking the back of the head. EXAM: CT HEAD WITHOUT CONTRAST CT  CERVICAL SPINE WITHOUT CONTRAST TECHNIQUE: Multidetector CT imaging of the head and cervical spine was performed following the standard protocol without intravenous contrast. Multiplanar CT image reconstructions of the cervical spine were also generated. COMPARISON:  05/30/2017 FINDINGS: CT HEAD FINDINGS Brain: Remote right PCA distribution stroke potentially with some right temporal lobe extension, and ipsilateral dilatation of the occipital horn of the right lateral ventricle not changed from prior. Stable small left anterior thalamic lacunar infarct. Periventricular white matter and corona radiata hypodensities favor chronic ischemic microvascular white matter disease. No intracranial hemorrhage, mass lesion, or acute CVA. Vascular: There is atherosclerotic calcification of the cavernous carotid arteries bilaterally. Skull: Unremarkable Sinuses/Orbits: Unremarkable Other: No supplemental non-categorized findings. CT CERVICAL SPINE FINDINGS Alignment: No vertebral subluxation is observed. Skull base and vertebrae: Spurring and loss of articular space anteriorly at the C1-2 articulation. Intraspinous and interfacet fusion on the left at C2-3. Solid interbody fusion at C5-6 with ACDF. No appreciable cervical spine fracture. Soft tissues and spinal canal: Unremarkable Disc levels: Uncinate and facet spurring cause bilateral foraminal impingement at C3-4, C4-5, and C5-6, more prominent on the right than the left. Upper chest: Branch vessel atherosclerotic vascular calcification. Mosaic attenuation in the lung apices. Other: No supplemental non-categorized findings. IMPRESSION: 1. No acute intracranial findings or acute cervical spine findings. 2. Remote right PCA distribution stroke with some right temporal lobe involvement, no change from prior. 3. Periventricular white matter and corona radiata hypodensities favor chronic ischemic microvascular white matter disease. 4. Spurring causes impingement at C3-4, C4-5, and  C5-6. 5. Solid interbody fusion at C5-6 with ACDF. Spontaneous interspinous and left inter facet fusion at C2-3. 6. Atherosclerosis. 7. Mosaic attenuation in the lung apices. Only a small part of this is visualized. Potential causes might include air trapping, edema, or extrinsic allergic alveolitis. Consider dedicated chest radiography. Electronically Signed   By: Van Clines M.D.   On: 12/18/2017 17:31   Ct Cervical Spine Wo Contrast  Result Date: 12/18/2017 CLINICAL DATA:  3 falls this week, most recently striking the back of the head. EXAM: CT HEAD WITHOUT CONTRAST CT CERVICAL SPINE WITHOUT CONTRAST TECHNIQUE: Multidetector CT imaging of the head and cervical spine was performed following the standard protocol without intravenous contrast. Multiplanar CT image reconstructions of the cervical spine were also generated. COMPARISON:  05/30/2017 FINDINGS: CT HEAD FINDINGS Brain: Remote right PCA distribution stroke potentially with some right temporal lobe extension, and ipsilateral dilatation of the occipital horn of the right lateral ventricle not changed from prior. Stable small left anterior thalamic lacunar infarct. Periventricular white matter and corona radiata hypodensities favor chronic ischemic microvascular white matter disease. No intracranial hemorrhage, mass lesion, or acute CVA. Vascular: There is atherosclerotic calcification of the cavernous carotid arteries bilaterally. Skull: Unremarkable Sinuses/Orbits: Unremarkable Other: No supplemental non-categorized findings. CT CERVICAL SPINE FINDINGS Alignment: No vertebral subluxation is observed. Skull base and vertebrae: Spurring and loss of articular space anteriorly at the C1-2 articulation. Intraspinous and interfacet fusion on the left at C2-3. Solid interbody fusion at C5-6 with ACDF. No appreciable cervical spine fracture. Soft tissues and spinal canal: Unremarkable Disc levels: Uncinate and facet spurring cause bilateral foraminal  impingement at C3-4, C4-5, and C5-6, more prominent on the right than the left. Upper chest: Branch vessel atherosclerotic vascular calcification. Mosaic attenuation in the lung apices. Other: No supplemental non-categorized findings. IMPRESSION: 1. No acute intracranial findings or acute cervical spine findings.  2. Remote right PCA distribution stroke with some right temporal lobe involvement, no change from prior. 3. Periventricular white matter and corona radiata hypodensities favor chronic ischemic microvascular white matter disease. 4. Spurring causes impingement at C3-4, C4-5, and C5-6. 5. Solid interbody fusion at C5-6 with ACDF. Spontaneous interspinous and left inter facet fusion at C2-3. 6. Atherosclerosis. 7. Mosaic attenuation in the lung apices. Only a small part of this is visualized. Potential causes might include air trapping, edema, or extrinsic allergic alveolitis. Consider dedicated chest radiography. Electronically Signed   By: Van Clines M.D.   On: 12/18/2017 17:31    Procedures Procedures (including critical care time)  Medications Ordered in ED Medications - No data to display   Initial Impression / Assessment and Plan / ED Course  I have reviewed the triage vital signs and the nursing notes.  Pertinent labs & imaging results that were available during my care of the patient were reviewed by me and considered in my medical decision making (see chart for details).  Clinical Course as of Dec 20 1198  Sun Dec 18, 2017  1928 Normal  CBC with Differential(!) [EW]  1929 Normal except glucose high  Basic metabolic panel(!) [EW]  8756 Normal except elevated protein, and leukocytes, white cells and bacteria.  Culture sent.  Urinalysis, Routine w reflex microscopic(!) [EW]  1929 No acute abnormalities.  Images reviewed by me.  CT Head Wo Contrast [EW]  1929 Degenerative changes without fracture or dislocation.  She is reviewed by me  CT Cervical Spine Wo Contrast [EW]      Clinical Course User Index [EW] Daleen Bo, MD     Patient Vitals for the past 24 hrs:  BP Temp Temp src Pulse Resp SpO2 Height Weight  12/18/17 1930 (!) 187/81 - - 79 17 97 % - -  12/18/17 1900 (!) 186/174 - - 74 17 97 % - -  12/18/17 1845 (!) 170/133 - - 82 12 98 % - -  12/18/17 1830 (!) 145/113 - - 74 13 97 % - -  12/18/17 1815 (!) 182/69 - - 78 14 98 % - -  12/18/17 1809 (!) 196/76 - - 72 16 98 % - -  12/18/17 1717 (!) 191/81 - - 74 20 98 % - -  12/18/17 1555 - - - - - - 5\' 1"  (1.549 m) 58.1 kg  12/18/17 1552 (!) 172/74 98.6 F (37 C) Oral 71 17 98 % - -    7:28 PM Reevaluation with update and discussion. After initial assessment and treatment, an updated evaluation reveals patient remains comfortable.  Family members now states the patient has been having urinary frequency.  We discussed the somewhat abnormal urinalysis and they would like her treated for UTI.  We discussed the abnormality of chest x-ray imaging, seen on CT cervical spine, that can be followed up expectantly as needed with respiratory symptoms.  All questions were answered. Daleen Bo   Medical Decision Making: Fall, likely mechanical, and recurrent.  Chronically ill patient with multiple baseline medical abnormalities.  Doubt intracranial injury, cervical spine fracture or myelopathy.  Incidental nonspecific pulmonary finding and CT cervical spine imaging possibly to be worked up in the future.  No acute respiratory illnesses at this time.  Patient, family members, are aware of findings.  CRITICAL CARE-no Performed by: Daleen Bo  Nursing Notes Reviewed/ Care Coordinated Applicable Imaging Reviewed Interpretation of Laboratory Data incorporated into ED treatment  The patient appears reasonably screened and/or stabilized for discharge  and I doubt any other medical condition or other [facility] requiring further screening, evaluation, or treatment in the ED at this time prior to discharge.  Plan: Home  Medications-routine medications; Home Treatments-symptomatic treatment, avoid standing; return here if the recommended treatment, does not improve the symptoms; Recommended follow up-PCP as needed.  Final Clinical Impressions(s) / ED Diagnoses   Final diagnoses:  Fall, initial encounter  Injury of head, initial encounter  Urinary frequency    ED Discharge Orders         Ordered    ciprofloxacin (CIPRO) 250 MG tablet  Every 12 hours     12/18/17 1934           Daleen Bo, MD 12/19/17 1202

## 2017-12-18 NOTE — ED Triage Notes (Signed)
Pt from [facility] via [facility] after 3rd fall this week and hit back of her head per pt report (unwitnessed fall). Pt states this was her third fall and [facility] who are currently out of town wanted her evaluated in ER. No obvious deformity noted to back of head, no anticoags on board. Pt is A/O to self and events, but not time, which is baseline for her due to Hx dementia. Pt is DNR and has yellow form with her. Daughter in law at bedside, daughter who is POA currently in ER as well after a fall.  [facility] vitals... Cg 205 164/86 80hr 97ra  Pt has no complaints at this time.

## 2017-12-20 LAB — URINE CULTURE: Culture: NO GROWTH

## 2017-12-21 DIAGNOSIS — M6281 Muscle weakness (generalized): Secondary | ICD-10-CM | POA: Diagnosis not present

## 2017-12-21 DIAGNOSIS — Z9181 History of falling: Secondary | ICD-10-CM | POA: Diagnosis not present

## 2017-12-21 DIAGNOSIS — E119 Type 2 diabetes mellitus without complications: Secondary | ICD-10-CM | POA: Diagnosis not present

## 2017-12-21 DIAGNOSIS — R296 Repeated falls: Secondary | ICD-10-CM | POA: Diagnosis not present

## 2017-12-21 DIAGNOSIS — R41841 Cognitive communication deficit: Secondary | ICD-10-CM | POA: Diagnosis not present

## 2017-12-21 DIAGNOSIS — R2681 Unsteadiness on feet: Secondary | ICD-10-CM | POA: Diagnosis not present

## 2017-12-21 DIAGNOSIS — N390 Urinary tract infection, site not specified: Secondary | ICD-10-CM | POA: Diagnosis not present

## 2017-12-21 DIAGNOSIS — I10 Essential (primary) hypertension: Secondary | ICD-10-CM | POA: Diagnosis not present

## 2017-12-23 DIAGNOSIS — I10 Essential (primary) hypertension: Secondary | ICD-10-CM | POA: Diagnosis not present

## 2017-12-23 DIAGNOSIS — R2681 Unsteadiness on feet: Secondary | ICD-10-CM | POA: Diagnosis not present

## 2017-12-23 DIAGNOSIS — R296 Repeated falls: Secondary | ICD-10-CM | POA: Diagnosis not present

## 2017-12-23 DIAGNOSIS — R41841 Cognitive communication deficit: Secondary | ICD-10-CM | POA: Diagnosis not present

## 2017-12-23 DIAGNOSIS — M6281 Muscle weakness (generalized): Secondary | ICD-10-CM | POA: Diagnosis not present

## 2017-12-23 DIAGNOSIS — E119 Type 2 diabetes mellitus without complications: Secondary | ICD-10-CM | POA: Diagnosis not present

## 2017-12-27 DIAGNOSIS — N390 Urinary tract infection, site not specified: Secondary | ICD-10-CM | POA: Diagnosis not present

## 2017-12-27 DIAGNOSIS — M6281 Muscle weakness (generalized): Secondary | ICD-10-CM | POA: Diagnosis not present

## 2017-12-27 DIAGNOSIS — R41841 Cognitive communication deficit: Secondary | ICD-10-CM | POA: Diagnosis not present

## 2017-12-27 DIAGNOSIS — R2681 Unsteadiness on feet: Secondary | ICD-10-CM | POA: Diagnosis not present

## 2017-12-27 DIAGNOSIS — E119 Type 2 diabetes mellitus without complications: Secondary | ICD-10-CM | POA: Diagnosis not present

## 2017-12-27 DIAGNOSIS — I10 Essential (primary) hypertension: Secondary | ICD-10-CM | POA: Diagnosis not present

## 2017-12-27 DIAGNOSIS — Z9181 History of falling: Secondary | ICD-10-CM | POA: Diagnosis not present

## 2017-12-27 DIAGNOSIS — R296 Repeated falls: Secondary | ICD-10-CM | POA: Diagnosis not present

## 2017-12-27 DIAGNOSIS — R278 Other lack of coordination: Secondary | ICD-10-CM | POA: Diagnosis not present

## 2017-12-28 DIAGNOSIS — R2681 Unsteadiness on feet: Secondary | ICD-10-CM | POA: Diagnosis not present

## 2017-12-28 DIAGNOSIS — R278 Other lack of coordination: Secondary | ICD-10-CM | POA: Diagnosis not present

## 2017-12-28 DIAGNOSIS — I10 Essential (primary) hypertension: Secondary | ICD-10-CM | POA: Diagnosis not present

## 2017-12-28 DIAGNOSIS — M6281 Muscle weakness (generalized): Secondary | ICD-10-CM | POA: Diagnosis not present

## 2017-12-28 DIAGNOSIS — R41841 Cognitive communication deficit: Secondary | ICD-10-CM | POA: Diagnosis not present

## 2017-12-28 DIAGNOSIS — R296 Repeated falls: Secondary | ICD-10-CM | POA: Diagnosis not present

## 2017-12-29 DIAGNOSIS — R278 Other lack of coordination: Secondary | ICD-10-CM | POA: Diagnosis not present

## 2017-12-29 DIAGNOSIS — M6281 Muscle weakness (generalized): Secondary | ICD-10-CM | POA: Diagnosis not present

## 2017-12-29 DIAGNOSIS — R296 Repeated falls: Secondary | ICD-10-CM | POA: Diagnosis not present

## 2017-12-29 DIAGNOSIS — R41841 Cognitive communication deficit: Secondary | ICD-10-CM | POA: Diagnosis not present

## 2017-12-29 DIAGNOSIS — I10 Essential (primary) hypertension: Secondary | ICD-10-CM | POA: Diagnosis not present

## 2017-12-29 DIAGNOSIS — R2681 Unsteadiness on feet: Secondary | ICD-10-CM | POA: Diagnosis not present

## 2017-12-30 ENCOUNTER — Telehealth: Payer: Self-pay | Admitting: Internal Medicine

## 2017-12-30 DIAGNOSIS — R278 Other lack of coordination: Secondary | ICD-10-CM | POA: Diagnosis not present

## 2017-12-30 DIAGNOSIS — R296 Repeated falls: Secondary | ICD-10-CM | POA: Diagnosis not present

## 2017-12-30 DIAGNOSIS — I10 Essential (primary) hypertension: Secondary | ICD-10-CM | POA: Diagnosis not present

## 2017-12-30 DIAGNOSIS — R41841 Cognitive communication deficit: Secondary | ICD-10-CM | POA: Diagnosis not present

## 2017-12-30 DIAGNOSIS — M6281 Muscle weakness (generalized): Secondary | ICD-10-CM | POA: Diagnosis not present

## 2017-12-30 DIAGNOSIS — R2681 Unsteadiness on feet: Secondary | ICD-10-CM | POA: Diagnosis not present

## 2017-12-30 NOTE — Telephone Encounter (Signed)
Lm to call back ./cy 

## 2017-12-30 NOTE — Telephone Encounter (Signed)
Message has been addressed per [facility]

## 2017-12-30 NOTE — Telephone Encounter (Signed)
New Message   Pt c/o medication issue:  1. Name of Medication: tiZANidine (ZANAFLEX) 4 MG tablet  2. How are you currently taking this medication (dosage and times per day)? Take 1 tablet (4 mg total) by mouth every 8 (eight) hours as needed for muscle spasms.  3. Are you having a reaction (difficulty breathing--STAT)? no  4. What is your medication issue? Pt states that she has to start ciprofloxacin (CIPRO) 250 MG tablet an antibiotic but the facility will not give it to her unless [facility] send over a hold order for the zanaflex due to it cant be taken at the same time. Please fax to: [phone] [facility]

## 2018-01-02 ENCOUNTER — Emergency Department (HOSPITAL_COMMUNITY): Payer: Medicare Other

## 2018-01-02 ENCOUNTER — Emergency Department (HOSPITAL_COMMUNITY)
Admission: EM | Admit: 2018-01-02 | Discharge: 2018-01-02 | Disposition: A | Discharge status: Home / Self Care | Payer: Medicare Other | Attending: Emergency Medicine | Admitting: Emergency Medicine

## 2018-01-02 ENCOUNTER — Encounter (HOSPITAL_COMMUNITY): Payer: Self-pay | Admitting: *Deleted

## 2018-01-02 DIAGNOSIS — M6281 Muscle weakness (generalized): Secondary | ICD-10-CM | POA: Diagnosis not present

## 2018-01-02 DIAGNOSIS — I10 Essential (primary) hypertension: Secondary | ICD-10-CM | POA: Diagnosis not present

## 2018-01-02 DIAGNOSIS — R2681 Unsteadiness on feet: Secondary | ICD-10-CM | POA: Diagnosis not present

## 2018-01-02 DIAGNOSIS — F319 Bipolar disorder, unspecified: Secondary | ICD-10-CM | POA: Diagnosis not present

## 2018-01-02 DIAGNOSIS — E119 Type 2 diabetes mellitus without complications: Secondary | ICD-10-CM | POA: Insufficient documentation

## 2018-01-02 DIAGNOSIS — R41841 Cognitive communication deficit: Secondary | ICD-10-CM | POA: Diagnosis not present

## 2018-01-02 DIAGNOSIS — I2510 Atherosclerotic heart disease of native coronary artery without angina pectoris: Secondary | ICD-10-CM | POA: Diagnosis not present

## 2018-01-02 DIAGNOSIS — Z79899 Other long term (current) drug therapy: Secondary | ICD-10-CM | POA: Diagnosis not present

## 2018-01-02 DIAGNOSIS — J449 Chronic obstructive pulmonary disease, unspecified: Secondary | ICD-10-CM | POA: Diagnosis not present

## 2018-01-02 DIAGNOSIS — R278 Other lack of coordination: Secondary | ICD-10-CM | POA: Diagnosis not present

## 2018-01-02 DIAGNOSIS — Z87891 Personal history of nicotine dependence: Secondary | ICD-10-CM | POA: Diagnosis not present

## 2018-01-02 DIAGNOSIS — R296 Repeated falls: Secondary | ICD-10-CM | POA: Diagnosis not present

## 2018-01-02 MED ORDER — METOPROLOL TARTRATE 25 MG PO TABS
25.0000 mg | ORAL_TABLET | Freq: Once | ORAL | Status: AC
  Administered 2018-01-02: 25 mg via ORAL
  Filled 2018-01-02: qty 1

## 2018-01-02 MED ORDER — PHENYTOIN SODIUM EXTENDED 30 MG PO CAPS
30.0000 mg | ORAL_CAPSULE | Freq: Once | ORAL | Status: AC
  Administered 2018-01-02: 30 mg via ORAL
  Filled 2018-01-02: qty 1

## 2018-01-02 NOTE — ED Provider Notes (Signed)
I saw and evaluated the patient, reviewed the resident's note and I agree with the findings and plan with the following exceptions.   This is a 72 yo F who presents with asymptomatic hypertension. They have had blood pressures as high as 825 systolic. Not had any headache, chest pain, shortness of breath, abdominal pain, back pain lower extremity edema or changes in urinary frequency beyond normal. Has  been taking blood pressure medications as directed by the primary doctor. Exam does not show any evidence of neurologic changes, fluid overload, vision changes or other acute issues secondary to hypertension. As ACEP guidelines recommend not acutely lowering BP and having close PCP follow-up for asymptomatic hypertension I suggested that they keep a log of their blood pressures for the next 5-7 days and follow-up with her primary doctor for any medication changes. They know to return to the emergency department for any onset of the symptoms as listed above.      Merrily Pew, MD 01/02/18 2224

## 2018-01-02 NOTE — ED Notes (Signed)
ED Provider at bedside. 

## 2018-01-02 NOTE — Discharge Instructions (Addendum)
You have a high blood pressure reading today.  You do not have any symptoms of end organ damage such as stroke, heart attack, pulmonary edema or other side effects from this.  You have elected to not get any labs drawn and I think this is reasonable as I do not think they would change any of our management.  I do however think you need to check your blood pressures 2 or 3 times a day for the next 7 days and follow-up with your primary doctor to see if any adjustments to your medications need to be made.  If you develop any symptoms such as severe chest pain, shortness of breath, numbness, weakness, tingling, blurry vision, severe headache or swelling in your extremities with this high blood pressure the need to return to the emergency department immediately.

## 2018-01-02 NOTE — ED Notes (Signed)
Spoke w/ daughter and pt, relating to discharge, daughter will be taking pt back to [facility].  RN called and spoke to Simla and informed her that night time medications have been give.  Also documented on discharge instructions.

## 2018-01-02 NOTE — ED Triage Notes (Signed)
Pt in for evaluation of hypertension, pt had elevated BP at dentist office, denies pain or complaints

## 2018-01-02 NOTE — ED Provider Notes (Signed)
[facility] EMERGENCY DEPARTMENT Provider Note   CSN: [id number] Arrival date & time: 01/02/18  1717     History   Chief Complaint Chief Complaint  Patient presents with  . Hypertension    HPI Kathryn Lewis is a 72 y.o. female family states that patient usually takes her blood pressure medications at 5 PM at the facility however.  Patient is a 72 year old female with past medical history significant for below presenting for elevated blood pressure at the dentist today.  Patient usually takes her blood pressure medication at 5 PM at the facility however she was at the dentist today.  Systolic blood pressure was in the 180s to 190s so therefore the dentist wanted her to come to be evaluated at the emergency department.  Patient is asymptomatic at this time, adamantly denied any chest pain, shortness of breath, nausea, vomiting, blurry vision that is different from previous or headache.  The history is provided by the patient and a relative. No language interpreter was used.  Hypertension     Past Medical History:  Diagnosis Date  . Allergy   . Arthritis   . Bipolar 1 disorder ([place])   . Blood transfusion without reported diagnosis   . Carotid artery stenosis    50-69% bilateral ICA stenoses by velocity criteria but no visible plaque and ICA/CCA ratio 1.72 on right and 1.38 on left  . Coronary artery disease   . Depression   . Diabetes mellitus without complication ([place])   . Hypertension   . Left adrenal mass (HCC)    2.5 cm L adrenal mass on CT per note of Dr. Seleta Rhymes 05/20/2014  . MI, old   . PAD (peripheral artery disease) ([place])    03/2014 - CT aortogram with lower extremity runoff (mild to moderate disease in common iliac arteries, complete occulusion of her bilateral external iliac arteries with heavily disease common femoral arteries. She also has disease to her SFAs bilaterally. Popliteal arteries and tibial vessel runoff appears to be adequate)  .  Seizures ([place])   . Stroke [facility])     Patient Active Problem List   Diagnosis Date Noted  . Frequent falls 07/08/2017  . Back pain 07/08/2017  . Mixed hyperlipidemia 05/27/2017  . Unstable gait 01/10/2017  . Hyperlipidemia associated with type 2 diabetes mellitus ([place]) 01/10/2017  . OSA on CPAP 12/24/2016  . COPD (chronic obstructive pulmonary disease) ([place]) 12/24/2016  . Pulmonary nodule 12/24/2016  . Generalized weakness 12/12/2015  . Diabetes mellitus type 2 with neurological manifestations ([place]) 12/12/2015  . Essential hypertension 12/12/2015  . Hypertensive urgency 04/17/2015  . Seizure disorder as sequela of cerebrovascular accident ([place]) 04/17/2015  . CAD (coronary artery disease) 04/17/2015    Past Surgical History:  Procedure Laterality Date  . BREAST SURGERY  2012   small lump nonmilignant  . NECK SURGERY       OB History   None      Home Medications    Prior to Admission medications   Medication Sig Start Date End Date Taking? Authorizing Provider  albuterol (PROVENTIL HFA;VENTOLIN HFA) 108 (90 Base) MCG/ACT inhaler Inhale 2 puffs into the lungs every 6 (six) hours as needed for wheezing or shortness of breath. Patient taking differently: Inhale 2 puffs into the lungs 4 (four) times daily as needed for wheezing or shortness of breath.  12/24/16  Yes Rigoberto Noel, MD  amLODipine (NORVASC) 10 MG tablet Take 1 tablet (10 mg total) by mouth daily. 01/10/17  Yes Martinique, Betty G, MD  aspirin EC 81 MG tablet Take 81 mg by mouth daily.   Yes [provider]  Cholecalciferol (VITAMIN D) 2000 units CAPS Take 2,000 capsules by mouth daily.    Yes [provider]  ciprofloxacin (CIPRO) 250 MG tablet Take 250 mg by mouth 2 (two) times daily.   Yes [provider]  clopidogrel (PLAVIX) 75 MG tablet Take 1 tablet (75 mg total) by mouth daily. 01/10/17  Yes Martinique, Betty G, MD  ezetimibe (ZETIA) 10 MG tablet Take 1 tablet (10 mg total) by mouth  daily. 06/22/17  Yes Martinique, Betty G, MD  ferrous sulfate 325 (65 FE) MG tablet Take 650 mg by mouth daily.    Yes [provider]  fluticasone (FLONASE) 50 MCG/ACT nasal spray Place 2 sprays into both nostrils daily. 04/22/17  Yes Nafziger, Tommi Rumps, NP  furosemide (LASIX) 40 MG tablet Take 40 mg by mouth daily as needed (water retention).   Yes [provider]  isosorbide mononitrate (IMDUR) 30 MG 24 hr tablet Take 30 mg by mouth daily.    Yes [provider]  lamoTRIgine (LAMICTAL) 200 MG tablet Take 300 mg by mouth See admin instructions. Take 1 1/2 tablets (300 mg) by mouth twice daily - 8am and 2pm   Yes [provider]  metoprolol tartrate (LOPRESSOR) 25 MG tablet Take 1 tablet (25 mg total) by mouth 2 (two) times daily. 01/10/17  Yes Martinique, Betty G, MD  Omega-3 Fatty Acids (FISH OIL) 1000 MG CAPS Take 2,000 mg by mouth daily.   Yes [provider]  phenytoin (DILANTIN) 100 MG ER capsule T 3 CAPS (300MG ) PO HS ND Patient taking differently: Take 300 mg by mouth at bedtime.  09/07/17  Yes Martinique, Betty G, MD  phenytoin (DILANTIN) 30 MG ER capsule Take 1 capsule (30 mg total) by mouth at bedtime. 09/21/17  Yes Martinique, Betty G, MD  potassium chloride (K-DUR,KLOR-CON) 10 MEQ tablet Take 10 mEq by mouth daily as needed (with each dose of Lasix/furosemide).   Yes [provider]  rosuvastatin (CRESTOR) 40 MG tablet Take 1 tablet (40 mg total) by mouth daily. Patient taking differently: Take 40 mg by mouth every evening.  05/27/17 03/05/18 Yes Fay Records, MD  spironolactone (ALDACTONE) 50 MG tablet Take 1 tablet (50 mg total) by mouth daily. 05/27/17  Yes Fay Records, MD  tiZANidine (ZANAFLEX) 4 MG tablet Take 1 tablet (4 mg total) by mouth every 8 (eight) hours as needed for muscle spasms. 07/08/17  Yes Martinique, Betty G, MD  Spacer/Aero Chamber Mouthpiece MISC 1 Device by Does not apply route daily as needed. 12/24/16   Rigoberto Noel, MD    Family  History Family History  Problem Relation Age of Onset  . Hypertension Mother   . Arthritis Mother   . Diabetes Mother   . Early death Mother   . Hyperlipidemia Mother   . Stroke Mother   . Hypertension Father   . Alcohol abuse Father   . Early death Father   . Hyperlipidemia Father   . COPD Father   . Heart attack Father   . Hypertension Sister   . Birth defects Brother   . Depression Brother   . Hyperlipidemia Brother   . Hypertension Brother   . Arthritis Daughter   . Asthma Daughter   . COPD Daughter   . Arthritis Son   . COPD Son   . Hypertension Son   .  Hyperlipidemia Son   . Heart attack Son   . Arthritis Maternal Grandmother   . Cancer Maternal Grandmother     Social History Social History   Tobacco Use  . Smoking status: Former Smoker    Packs/day: 1.00    Types: Cigarettes    Last attempt to quit: 2015    Years since quitting: 4.9  . Smokeless tobacco: Never Used  Substance Use Topics  . Alcohol use: No  . Drug use: No     Allergies   Amoxicillin; Augmentin [amoxicillin-pot clavulanate]; Cephalexin; Erythromycin; Niacin and related; Cefadroxil; and Nitrofurantoin macrocrystal   Review of Systems Review of Systems  Unable to perform ROS: Dementia     Physical Exam Updated Vital Signs BP (!) 186/73   Pulse 66   Temp 97.8 F (36.6 C) (Oral)   Resp 17   LMP 03/17/2015   SpO2 96%   Physical Exam  Constitutional: She is oriented to person, place, and time. She appears well-developed and well-nourished. No distress.  HENT:  Head: Normocephalic and atraumatic.  Eyes: Conjunctivae are normal.  Neck: Neck supple.  Cardiovascular: Normal rate and regular rhythm.  No murmur heard. Pulmonary/Chest: Effort normal and breath sounds normal. No respiratory distress.  Abdominal: Soft. There is no tenderness.  Musculoskeletal: She exhibits no edema.  Neurological: She is alert and oriented to person, place, and time. She has normal strength. No  cranial nerve deficit or sensory deficit.  Skin: Skin is warm and dry.  Psychiatric: She has a normal mood and affect.  Nursing note and vitals reviewed.    ED Treatments / Results  Labs (all labs ordered are listed, but only abnormal results are displayed) Labs Reviewed - No data to display  EKG None  Radiology Dg Chest Portable 1 View  Result Date: 01/02/2018 CLINICAL DATA:  Hypertension. EXAM: PORTABLE CHEST 1 VIEW COMPARISON:  12/11/2015. FINDINGS: Stable borderline enlarged cardiac silhouette. Interval mild elevation of the left hemidiaphragm and minimal left basilar patchy density. Stable mild prominence of the interstitial markings with normal vascularity. No pleural fluid. Cervical spine fixation hardware. IMPRESSION: 1. Interval mild elevation of the left hemidiaphragm with minimal left basilar atelectasis or scarring. 2. Stable borderline cardiomegaly and mild chronic interstitial lung disease. Electronically Signed   By: Claudie Revering M.D.   On: 01/02/2018 19:24    Procedures Procedures (including critical care time)  Medications Ordered in ED Medications  metoprolol tartrate (LOPRESSOR) tablet 25 mg (25 mg Oral Given 01/02/18 1944)  phenytoin (DILANTIN) ER capsule 30 mg (30 mg Oral Given 01/02/18 1945)     Initial Impression / Assessment and Plan / ED Course  I have reviewed the triage vital signs and the nursing notes.  Pertinent labs & imaging results that were available during my care of the patient were reviewed by me and considered in my medical decision making (see chart for details).     Patient is a 72 year old female with past medical history significant for hypertension presenting for elevated blood pressure at the dentist today.  Patient is asymptomatic, did not take her medications as prescribed which usually takes at the facility of 5 PM because she was at the dentist. Patient will be given home medication of Lopressor 25mg  and Dilantin 30mg . No indication  at this time for lab work as patient does not have any symptoms. Patient and family will be counseled on taking BP medications as prescribed and follow up with PCP if patient continues to have elevated BP on recheck.  All questions answered, patient safe for discharge.   Final Clinical Impressions(s) / ED Diagnoses   Final diagnoses:  Hypertension, unspecified type    ED Discharge Orders    None       Erskine Squibb, MD 01/02/18 2149    Merrily Pew, MD 01/02/18 2224

## 2018-01-05 DIAGNOSIS — R2681 Unsteadiness on feet: Secondary | ICD-10-CM | POA: Diagnosis not present

## 2018-01-05 DIAGNOSIS — I10 Essential (primary) hypertension: Secondary | ICD-10-CM | POA: Diagnosis not present

## 2018-01-05 DIAGNOSIS — R41841 Cognitive communication deficit: Secondary | ICD-10-CM | POA: Diagnosis not present

## 2018-01-05 DIAGNOSIS — M6281 Muscle weakness (generalized): Secondary | ICD-10-CM | POA: Diagnosis not present

## 2018-01-05 DIAGNOSIS — R296 Repeated falls: Secondary | ICD-10-CM | POA: Diagnosis not present

## 2018-01-05 DIAGNOSIS — R278 Other lack of coordination: Secondary | ICD-10-CM | POA: Diagnosis not present

## 2018-01-06 DIAGNOSIS — R278 Other lack of coordination: Secondary | ICD-10-CM | POA: Diagnosis not present

## 2018-01-06 DIAGNOSIS — R41841 Cognitive communication deficit: Secondary | ICD-10-CM | POA: Diagnosis not present

## 2018-01-06 DIAGNOSIS — R2681 Unsteadiness on feet: Secondary | ICD-10-CM | POA: Diagnosis not present

## 2018-01-06 DIAGNOSIS — I10 Essential (primary) hypertension: Secondary | ICD-10-CM | POA: Diagnosis not present

## 2018-01-06 DIAGNOSIS — M6281 Muscle weakness (generalized): Secondary | ICD-10-CM | POA: Diagnosis not present

## 2018-01-06 DIAGNOSIS — R296 Repeated falls: Secondary | ICD-10-CM | POA: Diagnosis not present

## 2018-01-07 DIAGNOSIS — R278 Other lack of coordination: Secondary | ICD-10-CM | POA: Diagnosis not present

## 2018-01-07 DIAGNOSIS — R296 Repeated falls: Secondary | ICD-10-CM | POA: Diagnosis not present

## 2018-01-07 DIAGNOSIS — I10 Essential (primary) hypertension: Secondary | ICD-10-CM | POA: Diagnosis not present

## 2018-01-07 DIAGNOSIS — R2681 Unsteadiness on feet: Secondary | ICD-10-CM | POA: Diagnosis not present

## 2018-01-07 DIAGNOSIS — R41841 Cognitive communication deficit: Secondary | ICD-10-CM | POA: Diagnosis not present

## 2018-01-07 DIAGNOSIS — M6281 Muscle weakness (generalized): Secondary | ICD-10-CM | POA: Diagnosis not present

## 2018-01-09 ENCOUNTER — Ambulatory Visit (INDEPENDENT_AMBULATORY_CARE_PROVIDER_SITE_OTHER): Payer: Medicare Other | Admitting: Family Medicine

## 2018-01-09 ENCOUNTER — Encounter: Payer: Self-pay | Admitting: Family Medicine

## 2018-01-09 VITALS — BP 140/90 | HR 76 | Temp 98.8°F | Resp 12 | Ht 61.0 in | Wt 134.0 lb

## 2018-01-09 DIAGNOSIS — R41841 Cognitive communication deficit: Secondary | ICD-10-CM | POA: Diagnosis not present

## 2018-01-09 DIAGNOSIS — E1169 Type 2 diabetes mellitus with other specified complication: Secondary | ICD-10-CM | POA: Diagnosis not present

## 2018-01-09 DIAGNOSIS — R2681 Unsteadiness on feet: Secondary | ICD-10-CM

## 2018-01-09 DIAGNOSIS — G40909 Epilepsy, unspecified, not intractable, without status epilepticus: Secondary | ICD-10-CM | POA: Diagnosis not present

## 2018-01-09 DIAGNOSIS — R296 Repeated falls: Secondary | ICD-10-CM | POA: Diagnosis not present

## 2018-01-09 DIAGNOSIS — I10 Essential (primary) hypertension: Secondary | ICD-10-CM | POA: Diagnosis not present

## 2018-01-09 DIAGNOSIS — I69398 Other sequelae of cerebral infarction: Secondary | ICD-10-CM | POA: Diagnosis not present

## 2018-01-09 DIAGNOSIS — M6281 Muscle weakness (generalized): Secondary | ICD-10-CM | POA: Diagnosis not present

## 2018-01-09 DIAGNOSIS — R278 Other lack of coordination: Secondary | ICD-10-CM | POA: Diagnosis not present

## 2018-01-09 DIAGNOSIS — E1149 Type 2 diabetes mellitus with other diabetic neurological complication: Secondary | ICD-10-CM | POA: Diagnosis not present

## 2018-01-09 DIAGNOSIS — E785 Hyperlipidemia, unspecified: Secondary | ICD-10-CM | POA: Diagnosis not present

## 2018-01-09 DIAGNOSIS — I2510 Atherosclerotic heart disease of native coronary artery without angina pectoris: Secondary | ICD-10-CM | POA: Diagnosis not present

## 2018-01-09 NOTE — Progress Notes (Signed)
HPI:   Kathryn Lewis is a 72 y.o. female, who is here today with her daughter for 6 months follow up.   She was last seen on 07/08/17.  Since her last OV she has moved to assisted living, [facility].. She needs a Rx for palliative hospice care to be provided at assisted living facility. A FL2 form needs to be completed.  Unstable gait, has had frequent falls. Last time she fell was a few weeks ago. Due to dementia she does not remember to get up from wheel chair, which she does frequently.She was evaluated in the ER on 12/18/17.   Her daughter has already hired 2 people to stay with her during the day, so she will be under supervision most of the day.  Epilepsia,she has followed with Dr Delice Lesch. She is on Phenytoin 100 mg ER, takes 300 mg at night, and Lamictal 100 mg 1.5 tab bid. She has not had a seizure in long time.   HTN: BP's "pretty good." She was in the ER on 01/02/18 because elevated BP at her dentist's office.  BP 139/112 x 1.At this time she was having dental pain,dental abscess. She is currently on abx.  She is currently on Spironolactone 50 mg daily,Metoprolol tartrate 25 mg bid,Amlodipine 10 mg daily, and Imdur 30 mg daily.  Lab Results  Component Value Date   CREATININE 0.86 12/18/2017   BUN 14 12/18/2017   NA 138 12/18/2017   K 4.3 12/18/2017   CL 103 12/18/2017   CO2 25 12/18/2017   She denies visual changes, unusual headache,chest pain,dyspnea,gross hematuria,or LE edema.  DM II: On non pharmacologic treatment. BS's low 100's.  Lab Results  Component Value Date   HGBA1C 5.5 07/08/2017   Denies abdominal pain, nausea,vomiting, polydipsia,polyuria, or polyphagia.  HLD: She is on Zetia 10 mg daily and Crestor 40 mg. Tolerating medication well. No side effects reported.  Lab Results  Component Value Date   CHOL 183 09/09/2017   HDL 66 09/09/2017   LDLCALC 85 09/09/2017   TRIG 160 (H) 09/09/2017   CHOLHDL 2.8 09/09/2017        Review of Systems  Constitutional: Positive for fatigue. Negative for activity change, appetite change and fever.  HENT: Negative for mouth sores, nosebleeds and trouble swallowing.   Eyes: Negative for redness and visual disturbance.  Respiratory: Negative for cough, shortness of breath and wheezing.   Cardiovascular: Negative for chest pain, palpitations and leg swelling.  Gastrointestinal: Negative for abdominal pain, nausea and vomiting.       Negative for changes in bowel habits.  Genitourinary: Negative for decreased urine volume, dysuria and hematuria.  Musculoskeletal: Positive for arthralgias and gait problem.  Neurological: Negative for seizures, syncope and headaches.  Psychiatric/Behavioral: The patient is nervous/anxious.      Current Outpatient Medications on File Prior to Visit  Medication Sig Dispense Refill  . albuterol (PROVENTIL HFA;VENTOLIN HFA) 108 (90 Base) MCG/ACT inhaler Inhale 2 puffs into the lungs every 6 (six) hours as needed for wheezing or shortness of breath. (Patient taking differently: Inhale 2 puffs into the lungs 4 (four) times daily as needed for wheezing or shortness of breath. ) 1 Inhaler 6  . amLODipine (NORVASC) 10 MG tablet Take 1 tablet (10 mg total) by mouth daily. 90 tablet 2  . aspirin EC 81 MG tablet Take 81 mg by mouth daily.    . Cholecalciferol (VITAMIN D) 2000 units CAPS Take 2,000 capsules by mouth daily.     Marland Kitchen  ciprofloxacin (CIPRO) 250 MG tablet Take 250 mg by mouth 2 (two) times daily.    . clopidogrel (PLAVIX) 75 MG tablet Take 1 tablet (75 mg total) by mouth daily. 90 tablet 2  . ezetimibe (ZETIA) 10 MG tablet Take 1 tablet (10 mg total) by mouth daily. 90 tablet 3  . ferrous sulfate 325 (65 FE) MG tablet Take 650 mg by mouth daily.     . fluticasone (FLONASE) 50 MCG/ACT nasal spray Place 2 sprays into both nostrils daily. 16 g 6  . furosemide (LASIX) 40 MG tablet Take 40 mg by mouth daily as needed (water retention).    .  isosorbide mononitrate (IMDUR) 30 MG 24 hr tablet Take 30 mg by mouth daily.     Marland Kitchen lamoTRIgine (LAMICTAL) 200 MG tablet Take 300 mg by mouth See admin instructions. Take 1 1/2 tablets (300 mg) by mouth twice daily - 8am and 2pm    . metoprolol tartrate (LOPRESSOR) 25 MG tablet Take 1 tablet (25 mg total) by mouth 2 (two) times daily. 180 tablet 2  . Omega-3 Fatty Acids (FISH OIL) 1000 MG CAPS Take 2,000 mg by mouth daily.    . phenytoin (DILANTIN) 100 MG ER capsule T 3 CAPS (300MG ) PO HS ND (Patient taking differently: Take 300 mg by mouth at bedtime. ) 90 capsule 10  . phenytoin (DILANTIN) 30 MG ER capsule Take 1 capsule (30 mg total) by mouth at bedtime. 30 capsule 0  . potassium chloride (K-DUR,KLOR-CON) 10 MEQ tablet Take 10 mEq by mouth daily as needed (with each dose of Lasix/furosemide).    . rosuvastatin (CRESTOR) 40 MG tablet Take 1 tablet (40 mg total) by mouth daily. (Patient taking differently: Take 40 mg by mouth every evening. ) 90 tablet 3  . Spacer/Aero Chamber Mouthpiece MISC 1 Device by Does not apply route daily as needed. 1 each 0  . spironolactone (ALDACTONE) 50 MG tablet Take 1 tablet (50 mg total) by mouth daily. 30 tablet 11  . tiZANidine (ZANAFLEX) 4 MG tablet Take 1 tablet (4 mg total) by mouth every 8 (eight) hours as needed for muscle spasms. 60 tablet 0   No current facility-administered medications on file prior to visit.      Past Medical History:  Diagnosis Date  . Allergy   . Arthritis   . Bipolar 1 disorder ([place])   . Blood transfusion without reported diagnosis   . Carotid artery stenosis    50-69% bilateral ICA stenoses by velocity criteria but no visible plaque and ICA/CCA ratio 1.72 on right and 1.38 on left  . Coronary artery disease   . Depression   . Diabetes mellitus without complication ([place])   . Hypertension   . Left adrenal mass (HCC)    2.5 cm L adrenal mass on CT per note of Dr. Seleta Rhymes 05/20/2014  . MI, old   . PAD (peripheral artery disease)  ([place])    03/2014 - CT aortogram with lower extremity runoff (mild to moderate disease in common iliac arteries, complete occulusion of her bilateral external iliac arteries with heavily disease common femoral arteries. She also has disease to her SFAs bilaterally. Popliteal arteries and tibial vessel runoff appears to be adequate)  . Seizures ([place])   . Stroke [facility])    Allergies  Allergen Reactions  . Amoxicillin Other (See Comments)    colitis  . Augmentin [Amoxicillin-Pot Clavulanate] Other (See Comments)    colitis  . Cephalexin Other (See Comments)    colitis  .  Erythromycin Other (See Comments)    colitis  . Niacin And Related Other (See Comments)    colitis  . Cefadroxil Other (See Comments)    Reported by [facility] 2013 - unknown reaction  . Nitrofurantoin Macrocrystal Other (See Comments)    Reported by [facility] 2013 - unknown reaction    Social History   Socioeconomic History  . Marital status: Single    Spouse name: Not on file  . Number of children: Not on file  . Years of education: Not on file  . Highest education level: Not on file  Occupational History  . Not on file  Social Needs  . Financial resource strain: Not on file  . Food insecurity:    Worry: Not on file    Inability: Not on file  . Transportation needs:    Medical: Not on file    Non-medical: Not on file  Tobacco Use  . Smoking status: Former Smoker    Packs/day: 1.00    Types: Cigarettes    Last attempt to quit: 2015    Years since quitting: 4.9  . Smokeless tobacco: Never Used  Substance and Sexual Activity  . Alcohol use: No  . Drug use: No  . Sexual activity: Not on file  Lifestyle  . Physical activity:    Days per week: Not on file    Minutes per session: Not on file  . Stress: Not on file  Relationships  . Social connections:    Talks on phone: Not on file    Gets together: Not on file    Attends religious service: Not on file    Active member of club or organization:  Not on file    Attends meetings of clubs or organizations: Not on file    Relationship status: Not on file  Other Topics Concern  . Not on file  Social History Narrative   Pt lives in assisted living facility   Has 2 adult children   College   Worked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    Vitals:   01/09/18 1620  BP: 140/90  Pulse: 76  Resp: 12  Temp: 98.8 F (37.1 C)  SpO2: 97%   Body mass index is 25.32 kg/m.   Physical Exam  Nursing note and vitals reviewed. Constitutional: She appears well-developed. No distress.  HENT:  Head: Normocephalic and atraumatic.  Mouth/Throat: Oropharynx is clear and moist and mucous membranes are normal.  Eyes: Pupils are equal, round, and reactive to light. Conjunctivae are normal.  Cardiovascular: Normal rate and regular rhythm.  No murmur heard. Respiratory: Effort normal and breath sounds normal. No respiratory distress.  GI: Soft. She exhibits no mass. There is no abdominal tenderness.  Musculoskeletal:        General: No edema.  Lymphadenopathy:    She has no cervical adenopathy.  Neurological: She is alert. She has normal strength. No cranial nerve deficit.  Oriented x 2,person and place.  In a wheel chair.  Skin: Skin is warm. No rash noted. No erythema.  Psychiatric: She has a normal mood and affect.  Well groomed, good eye contact.    ASSESSMENT AND PLAN:   Ms. Kathryn Lewis was seen today for 6 months follow-up.   Diabetes mellitus type 2 with neurological manifestations ([place]) HgA1C has been on goal. She does not want A1C today. BS to continue been monitored. Regular exercise and healthy diet with avoidance of added sugar food intake is an important part of treatment  and recommended. Annual eye exam, periodic dental and foot care recommended.   Essential hypertension Otherwise adequate controlled here today. BP to be monitor periodically. Continue low salt diet. No changes in current management.  Unstable gait Fall  prevention  Discussed. She is under frequent supervision to prevent her from getting up from wheel chair and therefore prevent falls. Some of her meds could aggravate problem.  Seizure disorder as sequela of cerebrovascular accident ([place]) No seizure since 2016. Her daughter does not think she needs to continue following with neuro. She is asking me to continue refilling medication (which I am already doing due to running out before neuro appt)  As far as problem is stable I can continue writing  her Rx. Instructed about warning signs.  Hyperlipidemia associated with type 2 diabetes mellitus ([place]) No changes in current treatment. Low fat diet also recommended.        G. Martinique, MD  [facility]. [place] office.

## 2018-01-09 NOTE — Patient Instructions (Addendum)
A few things to remember from today's visit:   Diabetes mellitus type 2 with neurological manifestations [facility])  Essential hypertension  Unstable gait  Seizure disorder as sequela of cerebrovascular accident ([place])  No changes today. I will continue seeing you annually,before if needed.  Fall prevention.  Please be sure medication list is accurate. If a new problem present, please set up appointment sooner than planned today.

## 2018-01-10 DIAGNOSIS — I10 Essential (primary) hypertension: Secondary | ICD-10-CM | POA: Diagnosis not present

## 2018-01-10 DIAGNOSIS — R278 Other lack of coordination: Secondary | ICD-10-CM | POA: Diagnosis not present

## 2018-01-10 DIAGNOSIS — R41841 Cognitive communication deficit: Secondary | ICD-10-CM | POA: Diagnosis not present

## 2018-01-10 DIAGNOSIS — R296 Repeated falls: Secondary | ICD-10-CM | POA: Diagnosis not present

## 2018-01-10 DIAGNOSIS — M6281 Muscle weakness (generalized): Secondary | ICD-10-CM | POA: Diagnosis not present

## 2018-01-10 DIAGNOSIS — R2681 Unsteadiness on feet: Secondary | ICD-10-CM | POA: Diagnosis not present

## 2018-01-11 DIAGNOSIS — I10 Essential (primary) hypertension: Secondary | ICD-10-CM | POA: Diagnosis not present

## 2018-01-11 DIAGNOSIS — R41841 Cognitive communication deficit: Secondary | ICD-10-CM | POA: Diagnosis not present

## 2018-01-11 DIAGNOSIS — R2681 Unsteadiness on feet: Secondary | ICD-10-CM | POA: Diagnosis not present

## 2018-01-11 DIAGNOSIS — R278 Other lack of coordination: Secondary | ICD-10-CM | POA: Diagnosis not present

## 2018-01-11 DIAGNOSIS — R296 Repeated falls: Secondary | ICD-10-CM | POA: Diagnosis not present

## 2018-01-11 DIAGNOSIS — M6281 Muscle weakness (generalized): Secondary | ICD-10-CM | POA: Diagnosis not present

## 2018-01-12 ENCOUNTER — Telehealth: Payer: Self-pay | Admitting: Family Medicine

## 2018-01-12 ENCOUNTER — Non-Acute Institutional Stay: Payer: Medicare Other | Admitting: Nurse Practitioner

## 2018-01-12 DIAGNOSIS — R269 Unspecified abnormalities of gait and mobility: Secondary | ICD-10-CM | POA: Diagnosis not present

## 2018-01-12 DIAGNOSIS — F0390 Unspecified dementia without behavioral disturbance: Secondary | ICD-10-CM | POA: Diagnosis not present

## 2018-01-12 DIAGNOSIS — Z515 Encounter for palliative care: Secondary | ICD-10-CM | POA: Diagnosis not present

## 2018-01-12 NOTE — Progress Notes (Signed)
[facility] Telephone: [phone] Fax: [phone]  PATIENT NAME: Kathryn Lewis DOB: 12/24/1945 MRN: [id number]  PRIMARY CARE PROVIDER:   Martinique, Betty G, MD  REFERRING PROVIDER:  Martinique, Betty G, MD [address] [place], [place] [address]  RESPONSIBLE PARTY:   Reyana Leisey (daughter) [phone]   HISTORY OF PRESENT ILLNESS:  Kathryn Lewis is a 72 y.o. year old female with multiple medical problems including dementia, STML, s/p CVA, gait disorder, frequent falls,CAD,. Palliative Care was asked to help with symptom management and to  address goals of care.    Additional information obtained from patient: patient with unsteady gait but can bear weight; can transfer from bed to wheelchair and to shower; at times will ambulate without assistance and has had several falls. Minimum assistance with dressing; feeds herself says her appetite is good and she sleeps well. Patient admits to chronic mild depression has h/o bipolar disorder with several psych hospitalizations in remote past; that does not interfere with her daily functional status; has OSA but does not wear CPAP because "it doesn't work"; reports h/o several strokes and heart attacks; denies chest pain or shortness of breath. Is continent of bladder and bowel   ASSESSMENT/ PLAN/RECOMMENDATIONS  -STM s/p several CVA's Seizure d/o Bipolar I d/o -has multpile  Notes/reminders in room -family to hire care assistant -no s/s of acute psychosis or mania -continue dilantin and lamictal -continue supportive care  COPD H/O tobacco abuse 2ppd -continue current regimen  CAD Carotid artery stenosis PAD HTN HLD T2DM -continue current regimen ordered by PCP and Cardiology -patient with no signs of acute cardiac ischemia  ACP -DNR   I spent 45 minutes providing this consultation,  from 12:30 to 1:15. More than 50% of the time in this consultation was spent coordinating communication.     CODE STATUS:  DNR  PPS: 40% HOSPICE ELIGIBILITY/DIAGNOSIS: TBD  PAST MEDICAL HISTORY:  Past Medical History:  Diagnosis Date  . Allergy   . Arthritis   . Bipolar 1 disorder ([place])   . Blood transfusion without reported diagnosis   . Carotid artery stenosis    50-69% bilateral ICA stenoses by velocity criteria but no visible plaque and ICA/CCA ratio 1.72 on right and 1.38 on left  . Coronary artery disease   . Depression   . Diabetes mellitus without complication ([place])   . Hypertension   . Left adrenal mass ([facility])    2.5 cm L adrenal mass on CT per note of Dr. Seleta Rhymes 05/20/2014  . MI, old   . PAD (peripheral artery disease) ([place])    03/2014 - CT aortogram with lower extremity runoff (mild to moderate disease in common iliac arteries, complete occulusion of her bilateral external iliac arteries with heavily disease common femoral arteries. She also has disease to her SFAs bilaterally. Popliteal arteries and tibial vessel runoff appears to be adequate)  . Seizures ([place])   . Stroke [facility])     SOCIAL HX:  Social History   Tobacco Use  . Smoking status: Former Smoker    Packs/day: 1.00    Types: Cigarettes    Last attempt to quit: 2015    Years since quitting: 4.9  . Smokeless tobacco: Never Used  Substance Use Topics  . Alcohol use: No    ALLERGIES:  Allergies  Allergen Reactions  . Amoxicillin Other (See Comments)    colitis  . Augmentin [Amoxicillin-Pot Clavulanate] Other (See Comments)    colitis  . Cephalexin Other (See Comments)  colitis  . Erythromycin Other (See Comments)    colitis  . Niacin And Related Other (See Comments)    colitis  . Cefadroxil Other (See Comments)    Reported by [facility] 2013 - unknown reaction  . Nitrofurantoin Macrocrystal Other (See Comments)    Reported by [facility] 2013 - unknown reaction     PERTINENT MEDICATIONS:  Outpatient Encounter Medications as of 01/12/2018  Medication Sig  . albuterol (PROVENTIL HFA;VENTOLIN HFA) 108 (90 Base)  MCG/ACT inhaler Inhale 2 puffs into the lungs every 6 (six) hours as needed for wheezing or shortness of breath. (Patient taking differently: Inhale 2 puffs into the lungs 4 (four) times daily as needed for wheezing or shortness of breath. )  . amLODipine (NORVASC) 10 MG tablet Take 1 tablet (10 mg total) by mouth daily.  Marland Kitchen aspirin EC 81 MG tablet Take 81 mg by mouth daily.  . Cholecalciferol (VITAMIN D) 2000 units CAPS Take 2,000 capsules by mouth daily.   . ciprofloxacin (CIPRO) 250 MG tablet Take 250 mg by mouth 2 (two) times daily.  . clopidogrel (PLAVIX) 75 MG tablet Take 1 tablet (75 mg total) by mouth daily.  Marland Kitchen ezetimibe (ZETIA) 10 MG tablet Take 1 tablet (10 mg total) by mouth daily.  . ferrous sulfate 325 (65 FE) MG tablet Take 650 mg by mouth daily.   . fluticasone (FLONASE) 50 MCG/ACT nasal spray Place 2 sprays into both nostrils daily.  . furosemide (LASIX) 40 MG tablet Take 40 mg by mouth daily as needed (water retention).  . isosorbide mononitrate (IMDUR) 30 MG 24 hr tablet Take 30 mg by mouth daily.   Marland Kitchen lamoTRIgine (LAMICTAL) 200 MG tablet Take 300 mg by mouth See admin instructions. Take 1 1/2 tablets (300 mg) by mouth twice daily - 8am and 2pm  . metoprolol tartrate (LOPRESSOR) 25 MG tablet Take 1 tablet (25 mg total) by mouth 2 (two) times daily.  . Omega-3 Fatty Acids (FISH OIL) 1000 MG CAPS Take 2,000 mg by mouth daily.  . phenytoin (DILANTIN) 100 MG ER capsule T 3 CAPS (300MG ) PO HS ND (Patient taking differently: Take 300 mg by mouth at bedtime. )  . phenytoin (DILANTIN) 30 MG ER capsule Take 1 capsule (30 mg total) by mouth at bedtime.  . potassium chloride (K-DUR,KLOR-CON) 10 MEQ tablet Take 10 mEq by mouth daily as needed (with each dose of Lasix/furosemide).  . rosuvastatin (CRESTOR) 40 MG tablet Take 1 tablet (40 mg total) by mouth daily. (Patient taking differently: Take 40 mg by mouth every evening. )  . Spacer/Aero Chamber Mouthpiece MISC 1 Device by Does not apply  route daily as needed.  Marland Kitchen spironolactone (ALDACTONE) 50 MG tablet Take 1 tablet (50 mg total) by mouth daily.  Marland Kitchen tiZANidine (ZANAFLEX) 4 MG tablet Take 1 tablet (4 mg total) by mouth every 8 (eight) hours as needed for muscle spasms.   No facility-administered encounter medications on file as of 01/12/2018.     PHYSICAL EXAM:   General: NAD,  Cardiovascular: regular rate and rhythm Pulmonary: clear ant fields Abdomen: soft, nontender, + bowel sounds GU: no suprapubic tenderness Extremities: no edema, no joint deformities Skin: no rashes Neurological: Weakness but otherwise nonfocal   G Martinique, NP

## 2018-01-12 NOTE — Telephone Encounter (Unsigned)
Copied from [place] [id number]. Topic: Quick Communication - Home Health Verbal Orders >> Jan 12, 2018  4:10 PM Carolyn Stare wrote: Inge Rise with  [facility] wanted to let know Dr Martinique will be going out to see the pt on 01/14/18 to get her started with services   Callback Number    [phone] spoke with   [place]

## 2018-01-13 DIAGNOSIS — R296 Repeated falls: Secondary | ICD-10-CM | POA: Diagnosis not present

## 2018-01-13 DIAGNOSIS — M6281 Muscle weakness (generalized): Secondary | ICD-10-CM | POA: Diagnosis not present

## 2018-01-13 DIAGNOSIS — I10 Essential (primary) hypertension: Secondary | ICD-10-CM | POA: Diagnosis not present

## 2018-01-13 DIAGNOSIS — R2681 Unsteadiness on feet: Secondary | ICD-10-CM | POA: Diagnosis not present

## 2018-01-13 DIAGNOSIS — R41841 Cognitive communication deficit: Secondary | ICD-10-CM | POA: Diagnosis not present

## 2018-01-13 DIAGNOSIS — R278 Other lack of coordination: Secondary | ICD-10-CM | POA: Diagnosis not present

## 2018-01-13 NOTE — Telephone Encounter (Signed)
Noted. FYI sent to Dr. Jordan. 

## 2018-01-14 DIAGNOSIS — R296 Repeated falls: Secondary | ICD-10-CM | POA: Diagnosis not present

## 2018-01-14 DIAGNOSIS — I10 Essential (primary) hypertension: Secondary | ICD-10-CM | POA: Diagnosis not present

## 2018-01-14 DIAGNOSIS — M6281 Muscle weakness (generalized): Secondary | ICD-10-CM | POA: Diagnosis not present

## 2018-01-14 DIAGNOSIS — R41841 Cognitive communication deficit: Secondary | ICD-10-CM | POA: Diagnosis not present

## 2018-01-14 DIAGNOSIS — R2681 Unsteadiness on feet: Secondary | ICD-10-CM | POA: Diagnosis not present

## 2018-01-14 DIAGNOSIS — R278 Other lack of coordination: Secondary | ICD-10-CM | POA: Diagnosis not present

## 2018-01-16 ENCOUNTER — Non-Acute Institutional Stay: Payer: Medicare Other | Admitting: Nurse Practitioner

## 2018-01-16 DIAGNOSIS — M6281 Muscle weakness (generalized): Secondary | ICD-10-CM | POA: Diagnosis not present

## 2018-01-16 DIAGNOSIS — R269 Unspecified abnormalities of gait and mobility: Secondary | ICD-10-CM | POA: Diagnosis not present

## 2018-01-16 DIAGNOSIS — Z7189 Other specified counseling: Secondary | ICD-10-CM | POA: Diagnosis not present

## 2018-01-16 DIAGNOSIS — Z515 Encounter for palliative care: Secondary | ICD-10-CM | POA: Diagnosis not present

## 2018-01-16 DIAGNOSIS — I10 Essential (primary) hypertension: Secondary | ICD-10-CM | POA: Diagnosis not present

## 2018-01-16 DIAGNOSIS — R278 Other lack of coordination: Secondary | ICD-10-CM | POA: Diagnosis not present

## 2018-01-16 DIAGNOSIS — R296 Repeated falls: Secondary | ICD-10-CM | POA: Diagnosis not present

## 2018-01-16 DIAGNOSIS — F0390 Unspecified dementia without behavioral disturbance: Secondary | ICD-10-CM | POA: Diagnosis not present

## 2018-01-16 DIAGNOSIS — R2681 Unsteadiness on feet: Secondary | ICD-10-CM | POA: Diagnosis not present

## 2018-01-16 DIAGNOSIS — R41841 Cognitive communication deficit: Secondary | ICD-10-CM | POA: Diagnosis not present

## 2018-01-17 DIAGNOSIS — R296 Repeated falls: Secondary | ICD-10-CM | POA: Diagnosis not present

## 2018-01-17 DIAGNOSIS — I10 Essential (primary) hypertension: Secondary | ICD-10-CM | POA: Diagnosis not present

## 2018-01-17 DIAGNOSIS — R41841 Cognitive communication deficit: Secondary | ICD-10-CM | POA: Diagnosis not present

## 2018-01-17 DIAGNOSIS — M6281 Muscle weakness (generalized): Secondary | ICD-10-CM | POA: Diagnosis not present

## 2018-01-17 DIAGNOSIS — R2681 Unsteadiness on feet: Secondary | ICD-10-CM | POA: Diagnosis not present

## 2018-01-17 DIAGNOSIS — R278 Other lack of coordination: Secondary | ICD-10-CM | POA: Diagnosis not present

## 2018-01-19 ENCOUNTER — Telehealth: Payer: Self-pay | Admitting: Family Medicine

## 2018-01-19 ENCOUNTER — Encounter: Payer: Self-pay | Admitting: Nurse Practitioner

## 2018-01-19 DIAGNOSIS — R41841 Cognitive communication deficit: Secondary | ICD-10-CM | POA: Diagnosis not present

## 2018-01-19 DIAGNOSIS — R2681 Unsteadiness on feet: Secondary | ICD-10-CM | POA: Diagnosis not present

## 2018-01-19 DIAGNOSIS — R278 Other lack of coordination: Secondary | ICD-10-CM | POA: Diagnosis not present

## 2018-01-19 DIAGNOSIS — R296 Repeated falls: Secondary | ICD-10-CM | POA: Diagnosis not present

## 2018-01-19 DIAGNOSIS — I10 Essential (primary) hypertension: Secondary | ICD-10-CM | POA: Diagnosis not present

## 2018-01-19 DIAGNOSIS — M6281 Muscle weakness (generalized): Secondary | ICD-10-CM | POA: Diagnosis not present

## 2018-01-19 NOTE — Telephone Encounter (Signed)
Copied from [place] [id number]. Topic: Quick Communication - See Telephone Encounter >> Jan 19, 2018  4:30 PM Blase Mess A wrote: CRM for notification. See Telephone encounter for: 01/19/18.  Kecia from [facility] received the orders. Calling to notify that there is a delay in the start.  Will be able to go out on 01/22/18. [phone]

## 2018-01-19 NOTE — Progress Notes (Signed)
[facility] Telephone: [phone] Fax: [phone]  PATIENT NAME: Kathryn Lewis DOB: [birth year] MRN: [id number]  PRIMARY CARE PROVIDER:   Martinique, Betty G, MD  REFERRING PROVIDER:  Martinique, Betty G, MD [address] [place], [place] [address]  RESPONSIBLE PARTY:   Siriah Treat (daughter) [phone]   HISTORY OF PRESENT ILLNESS:  Kathryn Lewis is a 72 y.o. year old female with multiple medical problems including HTN, HLD, CAD, s/pCVA, gait disorder, STML, COPD, OSA,. Palliative Care was asked to help with sym[ptom management and to address goals of care.   Meeting requested by daughter to discuss patient's current status, plan of care and to discuss goals of care.   ASSESSMENT/ PLAN/RECOMMENDATIONS  -STM s/p several CVA's Seizure d/o Bipolar I d/o -Family has put reminders around patient's room -family to hire PCA to help patient -continue dilantin and lamictal  COPD H/O tobacco abuse 2ppd -PRN albuterol INH -flonase  CAD Carotid artery stenosis PAD HTN HLD T2DM -continue current regimen -no s/s of acute cardiac ishemia  ACP -DNR -briefly discussed MOST; to finalize in near future   I spent 45 minutes providing this consultation,  from 13:00 to 13:45. More than 50% of the time in this consultation was spent coordinating communication.    CODE STATUS: DNR  PPS: 40% HOSPICE ELIGIBILITY/DIAGNOSIS: TBD  PAST MEDICAL HISTORY:  Past Medical History:  Diagnosis Date  . Allergy   . Arthritis   . Bipolar 1 disorder ([place])   . Blood transfusion without reported diagnosis   . Carotid artery stenosis    50-69% bilateral ICA stenoses by velocity criteria but no visible plaque and ICA/CCA ratio 1.72 on right and 1.38 on left  . Coronary artery disease   . Depression   . Diabetes mellitus without complication ([place])   . Hypertension   . Left adrenal mass ([facility])    2.5 cm L adrenal mass on CT per note of Dr. Seleta Rhymes 05/20/2014  . MI, old   . PAD  (peripheral artery disease) ([place])    03/2014 - CT aortogram with lower extremity runoff (mild to moderate disease in common iliac arteries, complete occulusion of her bilateral external iliac arteries with heavily disease common femoral arteries. She also has disease to her SFAs bilaterally. Popliteal arteries and tibial vessel runoff appears to be adequate)  . Seizures ([place])   . Stroke [facility])     SOCIAL HX:  Social History   Tobacco Use  . Smoking status: Former Smoker    Packs/day: 1.00    Types: Cigarettes    Last attempt to quit: 2015    Years since quitting: 4.9  . Smokeless tobacco: Never Used  Substance Use Topics  . Alcohol use: No    ALLERGIES:  Allergies  Allergen Reactions  . Amoxicillin Other (See Comments)    colitis  . Augmentin [Amoxicillin-Pot Clavulanate] Other (See Comments)    colitis  . Cephalexin Other (See Comments)    colitis  . Erythromycin Other (See Comments)    colitis  . Niacin And Related Other (See Comments)    colitis  . Cefadroxil Other (See Comments)    Reported by [facility] 2013 - unknown reaction  . Nitrofurantoin Macrocrystal Other (See Comments)    Reported by [facility] 2013 - unknown reaction     PERTINENT MEDICATIONS:  Outpatient Encounter Medications as of 01/16/2018  Medication Sig  . albuterol (PROVENTIL HFA;VENTOLIN HFA) 108 (90 Base) MCG/ACT inhaler Inhale 2 puffs into the lungs every  6 (six) hours as needed for wheezing or shortness of breath. (Patient taking differently: Inhale 2 puffs into the lungs 4 (four) times daily as needed for wheezing or shortness of breath. )  . amLODipine (NORVASC) 10 MG tablet Take 1 tablet (10 mg total) by mouth daily.  Marland Kitchen aspirin EC 81 MG tablet Take 81 mg by mouth daily.  . Cholecalciferol (VITAMIN D) 2000 units CAPS Take 2,000 capsules by mouth daily.   . ciprofloxacin (CIPRO) 250 MG tablet Take 250 mg by mouth 2 (two) times daily.  . clopidogrel (PLAVIX) 75 MG tablet Take 1 tablet (75 mg  total) by mouth daily.  Marland Kitchen ezetimibe (ZETIA) 10 MG tablet Take 1 tablet (10 mg total) by mouth daily.  . ferrous sulfate 325 (65 FE) MG tablet Take 650 mg by mouth daily.   . fluticasone (FLONASE) 50 MCG/ACT nasal spray Place 2 sprays into both nostrils daily.  . furosemide (LASIX) 40 MG tablet Take 40 mg by mouth daily as needed (water retention).  . isosorbide mononitrate (IMDUR) 30 MG 24 hr tablet Take 30 mg by mouth daily.   Marland Kitchen lamoTRIgine (LAMICTAL) 200 MG tablet Take 300 mg by mouth See admin instructions. Take 1 1/2 tablets (300 mg) by mouth twice daily - 8am and 2pm  . metoprolol tartrate (LOPRESSOR) 25 MG tablet Take 1 tablet (25 mg total) by mouth 2 (two) times daily.  . Omega-3 Fatty Acids (FISH OIL) 1000 MG CAPS Take 2,000 mg by mouth daily.  . phenytoin (DILANTIN) 100 MG ER capsule T 3 CAPS (300MG ) PO HS ND (Patient taking differently: Take 300 mg by mouth at bedtime. )  . phenytoin (DILANTIN) 30 MG ER capsule Take 1 capsule (30 mg total) by mouth at bedtime.  . potassium chloride (K-DUR,KLOR-CON) 10 MEQ tablet Take 10 mEq by mouth daily as needed (with each dose of Lasix/furosemide).  . rosuvastatin (CRESTOR) 40 MG tablet Take 1 tablet (40 mg total) by mouth daily. (Patient taking differently: Take 40 mg by mouth every evening. )  . Spacer/Aero Chamber Mouthpiece MISC 1 Device by Does not apply route daily as needed.  Marland Kitchen spironolactone (ALDACTONE) 50 MG tablet Take 1 tablet (50 mg total) by mouth daily.  Marland Kitchen tiZANidine (ZANAFLEX) 4 MG tablet Take 1 tablet (4 mg total) by mouth every 8 (eight) hours as needed for muscle spasms.   No facility-administered encounter medications on file as of 01/16/2018.     PHYSICAL EXAM:   General: NAD,  Cardiovascular: regular rate and rhythm Pulmonary: clear ant fields Abdomen: soft, nontender, + bowel sounds GU: no suprapubic tenderness Extremities: no edema, no joint deformities Skin: no rashes Neurological: Weakness but otherwise  nonfocal  Stephanie Lewis Martinique, NP

## 2018-01-20 ENCOUNTER — Ambulatory Visit: Payer: Self-pay

## 2018-01-20 DIAGNOSIS — R41841 Cognitive communication deficit: Secondary | ICD-10-CM | POA: Diagnosis not present

## 2018-01-20 DIAGNOSIS — I10 Essential (primary) hypertension: Secondary | ICD-10-CM | POA: Diagnosis not present

## 2018-01-20 DIAGNOSIS — R278 Other lack of coordination: Secondary | ICD-10-CM | POA: Diagnosis not present

## 2018-01-20 DIAGNOSIS — R296 Repeated falls: Secondary | ICD-10-CM | POA: Diagnosis not present

## 2018-01-20 DIAGNOSIS — M6281 Muscle weakness (generalized): Secondary | ICD-10-CM | POA: Diagnosis not present

## 2018-01-20 DIAGNOSIS — R2681 Unsteadiness on feet: Secondary | ICD-10-CM | POA: Diagnosis not present

## 2018-01-20 NOTE — Telephone Encounter (Signed)
Out going call  To Kathryn Lewis who is the Patient daughter.  States her Mother blood pressure is to high for Her Mother to have dental procedure by Dr.  Lewanda Rife.  Family is requesting suggestions of Dr.  Betty Martinique.  Wondering if  Patient should be admitted in hospital for procedure.   Daughter is awaiting to hear Dr.  Inez Catalina Jordan's recommendation.

## 2018-01-22 DIAGNOSIS — E1169 Type 2 diabetes mellitus with other specified complication: Secondary | ICD-10-CM | POA: Diagnosis not present

## 2018-01-22 DIAGNOSIS — F0390 Unspecified dementia without behavioral disturbance: Secondary | ICD-10-CM | POA: Diagnosis not present

## 2018-01-22 DIAGNOSIS — E1149 Type 2 diabetes mellitus with other diabetic neurological complication: Secondary | ICD-10-CM | POA: Diagnosis not present

## 2018-01-22 DIAGNOSIS — E1151 Type 2 diabetes mellitus with diabetic peripheral angiopathy without gangrene: Secondary | ICD-10-CM | POA: Diagnosis not present

## 2018-01-22 DIAGNOSIS — F319 Bipolar disorder, unspecified: Secondary | ICD-10-CM | POA: Diagnosis not present

## 2018-01-22 DIAGNOSIS — G40909 Epilepsy, unspecified, not intractable, without status epilepticus: Secondary | ICD-10-CM | POA: Diagnosis not present

## 2018-01-22 DIAGNOSIS — Z87891 Personal history of nicotine dependence: Secondary | ICD-10-CM | POA: Diagnosis not present

## 2018-01-22 DIAGNOSIS — E785 Hyperlipidemia, unspecified: Secondary | ICD-10-CM | POA: Diagnosis not present

## 2018-01-22 DIAGNOSIS — I69398 Other sequelae of cerebral infarction: Secondary | ICD-10-CM | POA: Diagnosis not present

## 2018-01-22 DIAGNOSIS — I252 Old myocardial infarction: Secondary | ICD-10-CM | POA: Diagnosis not present

## 2018-01-22 DIAGNOSIS — I2510 Atherosclerotic heart disease of native coronary artery without angina pectoris: Secondary | ICD-10-CM | POA: Diagnosis not present

## 2018-01-22 DIAGNOSIS — I10 Essential (primary) hypertension: Secondary | ICD-10-CM | POA: Diagnosis not present

## 2018-01-23 NOTE — Telephone Encounter (Signed)
Message sent to Dr.Jordan. 

## 2018-01-23 NOTE — Telephone Encounter (Signed)
Spoke with patients daughter and gave instructions per Dr. Martinique and she verbalized understanding. Patient's daughter stated that a prescription needed to be sent to [facility] and Dr. Caesar Bookman office with directions.

## 2018-01-23 NOTE — Telephone Encounter (Signed)
Rx's faxed as requested.

## 2018-01-23 NOTE — Telephone Encounter (Signed)
Noted. FYI sent to Dr. Jordan. 

## 2018-01-23 NOTE — Telephone Encounter (Signed)
It is up to Dr. Lewanda Rife if he consider the need for hospital admission in order to have procedure done. Metoprolol succinate can be increased from 25 mg to 50 mg daily. No changes in rest of medications. Continue monitoring BP and call in 7 to 10 days with BP readings.  Thanks, BJ

## 2018-01-24 ENCOUNTER — Other Ambulatory Visit: Payer: Self-pay | Admitting: *Deleted

## 2018-01-24 ENCOUNTER — Telehealth: Payer: Self-pay | Admitting: Family Medicine

## 2018-01-24 DIAGNOSIS — I10 Essential (primary) hypertension: Secondary | ICD-10-CM

## 2018-01-24 NOTE — Telephone Encounter (Signed)
Copied from [place] [id number]. Topic: Quick Communication - Home Health Verbal Orders >> Jan 24, 2018  3:29 PM Valla Leaver wrote: Caller/Agency: [place] [facility] Number: [phone] Requesting OT/PT/Skilled Nursing/Social Work: Skilled Nursing Frequency: 2x a wk for  3 wks, 1x a wk for 2 wks, 2 prn  Also needs medical notes from 01/09/2018.

## 2018-01-26 NOTE — Telephone Encounter (Signed)
Spoke with Melissa at [place] at [facility] and gave verbal orders as requested. Office note from 01/09/18 faxed to [facility] as requested.

## 2018-01-27 ENCOUNTER — Telehealth: Payer: Self-pay | Admitting: *Deleted

## 2018-01-27 DIAGNOSIS — F319 Bipolar disorder, unspecified: Secondary | ICD-10-CM | POA: Diagnosis not present

## 2018-01-27 DIAGNOSIS — F0390 Unspecified dementia without behavioral disturbance: Secondary | ICD-10-CM | POA: Diagnosis not present

## 2018-01-27 DIAGNOSIS — I69398 Other sequelae of cerebral infarction: Secondary | ICD-10-CM | POA: Diagnosis not present

## 2018-01-27 DIAGNOSIS — E1151 Type 2 diabetes mellitus with diabetic peripheral angiopathy without gangrene: Secondary | ICD-10-CM | POA: Diagnosis not present

## 2018-01-27 DIAGNOSIS — I10 Essential (primary) hypertension: Secondary | ICD-10-CM | POA: Diagnosis not present

## 2018-01-27 DIAGNOSIS — I2510 Atherosclerotic heart disease of native coronary artery without angina pectoris: Secondary | ICD-10-CM | POA: Diagnosis not present

## 2018-01-27 NOTE — Telephone Encounter (Signed)
Kathryn Lewis- calling to report elevated BP- 150/84. (180/100- earlier)   They are calling for parameters for therapy due to high BP readings. Patient is not having symptoms. Please call with orders for week 1-4. [phone]

## 2018-01-31 DIAGNOSIS — F0390 Unspecified dementia without behavioral disturbance: Secondary | ICD-10-CM | POA: Diagnosis not present

## 2018-01-31 DIAGNOSIS — I2510 Atherosclerotic heart disease of native coronary artery without angina pectoris: Secondary | ICD-10-CM | POA: Diagnosis not present

## 2018-01-31 DIAGNOSIS — E1151 Type 2 diabetes mellitus with diabetic peripheral angiopathy without gangrene: Secondary | ICD-10-CM | POA: Diagnosis not present

## 2018-01-31 DIAGNOSIS — I10 Essential (primary) hypertension: Secondary | ICD-10-CM | POA: Diagnosis not present

## 2018-01-31 DIAGNOSIS — F319 Bipolar disorder, unspecified: Secondary | ICD-10-CM | POA: Diagnosis not present

## 2018-01-31 DIAGNOSIS — I69398 Other sequelae of cerebral infarction: Secondary | ICD-10-CM | POA: Diagnosis not present

## 2018-02-02 DIAGNOSIS — I10 Essential (primary) hypertension: Secondary | ICD-10-CM | POA: Diagnosis not present

## 2018-02-02 DIAGNOSIS — I2510 Atherosclerotic heart disease of native coronary artery without angina pectoris: Secondary | ICD-10-CM | POA: Diagnosis not present

## 2018-02-02 DIAGNOSIS — I69398 Other sequelae of cerebral infarction: Secondary | ICD-10-CM | POA: Diagnosis not present

## 2018-02-02 DIAGNOSIS — F0390 Unspecified dementia without behavioral disturbance: Secondary | ICD-10-CM | POA: Diagnosis not present

## 2018-02-02 DIAGNOSIS — F319 Bipolar disorder, unspecified: Secondary | ICD-10-CM | POA: Diagnosis not present

## 2018-02-02 DIAGNOSIS — E1151 Type 2 diabetes mellitus with diabetic peripheral angiopathy without gangrene: Secondary | ICD-10-CM | POA: Diagnosis not present

## 2018-02-03 DIAGNOSIS — E1151 Type 2 diabetes mellitus with diabetic peripheral angiopathy without gangrene: Secondary | ICD-10-CM | POA: Diagnosis not present

## 2018-02-03 DIAGNOSIS — F0390 Unspecified dementia without behavioral disturbance: Secondary | ICD-10-CM | POA: Diagnosis not present

## 2018-02-03 DIAGNOSIS — I69398 Other sequelae of cerebral infarction: Secondary | ICD-10-CM | POA: Diagnosis not present

## 2018-02-03 DIAGNOSIS — F319 Bipolar disorder, unspecified: Secondary | ICD-10-CM | POA: Diagnosis not present

## 2018-02-03 DIAGNOSIS — I10 Essential (primary) hypertension: Secondary | ICD-10-CM | POA: Diagnosis not present

## 2018-02-03 DIAGNOSIS — I2510 Atherosclerotic heart disease of native coronary artery without angina pectoris: Secondary | ICD-10-CM | POA: Diagnosis not present

## 2018-02-06 ENCOUNTER — Telehealth: Payer: Self-pay | Admitting: Family Medicine

## 2018-02-06 DIAGNOSIS — E1151 Type 2 diabetes mellitus with diabetic peripheral angiopathy without gangrene: Secondary | ICD-10-CM | POA: Diagnosis not present

## 2018-02-06 DIAGNOSIS — F319 Bipolar disorder, unspecified: Secondary | ICD-10-CM | POA: Diagnosis not present

## 2018-02-06 DIAGNOSIS — I69398 Other sequelae of cerebral infarction: Secondary | ICD-10-CM | POA: Diagnosis not present

## 2018-02-06 DIAGNOSIS — I2510 Atherosclerotic heart disease of native coronary artery without angina pectoris: Secondary | ICD-10-CM | POA: Diagnosis not present

## 2018-02-06 DIAGNOSIS — F0390 Unspecified dementia without behavioral disturbance: Secondary | ICD-10-CM | POA: Diagnosis not present

## 2018-02-06 DIAGNOSIS — I10 Essential (primary) hypertension: Secondary | ICD-10-CM | POA: Diagnosis not present

## 2018-02-06 NOTE — Telephone Encounter (Signed)
Copied from [place] [id number]. Topic: Quick Communication - See Telephone Encounter >> Feb 06, 2018 12:03 PM Antonieta Iba C wrote: CRM for notification. See Telephone encounter for: 02/06/18.  Anna Genre - speech therapy with [facility] is calling in to request vo for services for pt.  Frequency: 1 week 1 and 2 week 3 to address cognition.   CB: [phone]

## 2018-02-06 NOTE — Telephone Encounter (Signed)
Kathryn Lewis with physical therapy with [facility] states she never received a call regarding orders. She would like to see her for  " 2 week 4 ", please advise.   [phone]

## 2018-02-07 NOTE — Telephone Encounter (Signed)
It is okay to give verbal orders for PT as requested.  If BP is persistently above 150/90 metoprolol can be increased from 25 mg twice daily to 50 mg twice daily. No changes in rest of her medications. Please arrange 6 to 8 weeks follow-up appointment.  Thanks, BJ

## 2018-02-07 NOTE — Telephone Encounter (Signed)
Verbal orders given to [facility] as requested for patient.

## 2018-02-08 DIAGNOSIS — E1151 Type 2 diabetes mellitus with diabetic peripheral angiopathy without gangrene: Secondary | ICD-10-CM | POA: Diagnosis not present

## 2018-02-08 DIAGNOSIS — F319 Bipolar disorder, unspecified: Secondary | ICD-10-CM | POA: Diagnosis not present

## 2018-02-08 DIAGNOSIS — F0390 Unspecified dementia without behavioral disturbance: Secondary | ICD-10-CM | POA: Diagnosis not present

## 2018-02-08 DIAGNOSIS — I10 Essential (primary) hypertension: Secondary | ICD-10-CM | POA: Diagnosis not present

## 2018-02-08 DIAGNOSIS — I2510 Atherosclerotic heart disease of native coronary artery without angina pectoris: Secondary | ICD-10-CM | POA: Diagnosis not present

## 2018-02-08 DIAGNOSIS — I69398 Other sequelae of cerebral infarction: Secondary | ICD-10-CM | POA: Diagnosis not present

## 2018-02-08 NOTE — Telephone Encounter (Signed)
Spoke with Adriane and gave verbal orders for PT as requested. Also gave instructions per Dr. Martinique concerning blood pressures. Adriane verbalized understanding.

## 2018-02-09 DIAGNOSIS — I69398 Other sequelae of cerebral infarction: Secondary | ICD-10-CM | POA: Diagnosis not present

## 2018-02-09 DIAGNOSIS — I2510 Atherosclerotic heart disease of native coronary artery without angina pectoris: Secondary | ICD-10-CM | POA: Diagnosis not present

## 2018-02-09 DIAGNOSIS — E1151 Type 2 diabetes mellitus with diabetic peripheral angiopathy without gangrene: Secondary | ICD-10-CM | POA: Diagnosis not present

## 2018-02-09 DIAGNOSIS — I10 Essential (primary) hypertension: Secondary | ICD-10-CM | POA: Diagnosis not present

## 2018-02-09 DIAGNOSIS — F0390 Unspecified dementia without behavioral disturbance: Secondary | ICD-10-CM | POA: Diagnosis not present

## 2018-02-09 DIAGNOSIS — F319 Bipolar disorder, unspecified: Secondary | ICD-10-CM | POA: Diagnosis not present

## 2018-02-11 DIAGNOSIS — I2510 Atherosclerotic heart disease of native coronary artery without angina pectoris: Secondary | ICD-10-CM | POA: Diagnosis not present

## 2018-02-11 DIAGNOSIS — I10 Essential (primary) hypertension: Secondary | ICD-10-CM | POA: Diagnosis not present

## 2018-02-11 DIAGNOSIS — F319 Bipolar disorder, unspecified: Secondary | ICD-10-CM | POA: Diagnosis not present

## 2018-02-11 DIAGNOSIS — F0390 Unspecified dementia without behavioral disturbance: Secondary | ICD-10-CM | POA: Diagnosis not present

## 2018-02-11 DIAGNOSIS — E1151 Type 2 diabetes mellitus with diabetic peripheral angiopathy without gangrene: Secondary | ICD-10-CM | POA: Diagnosis not present

## 2018-02-11 DIAGNOSIS — I69398 Other sequelae of cerebral infarction: Secondary | ICD-10-CM | POA: Diagnosis not present

## 2018-02-13 DIAGNOSIS — E1151 Type 2 diabetes mellitus with diabetic peripheral angiopathy without gangrene: Secondary | ICD-10-CM | POA: Diagnosis not present

## 2018-02-13 DIAGNOSIS — I10 Essential (primary) hypertension: Secondary | ICD-10-CM | POA: Diagnosis not present

## 2018-02-13 DIAGNOSIS — F0390 Unspecified dementia without behavioral disturbance: Secondary | ICD-10-CM | POA: Diagnosis not present

## 2018-02-13 DIAGNOSIS — F319 Bipolar disorder, unspecified: Secondary | ICD-10-CM | POA: Diagnosis not present

## 2018-02-13 DIAGNOSIS — I2510 Atherosclerotic heart disease of native coronary artery without angina pectoris: Secondary | ICD-10-CM | POA: Diagnosis not present

## 2018-02-13 DIAGNOSIS — I69398 Other sequelae of cerebral infarction: Secondary | ICD-10-CM | POA: Diagnosis not present

## 2018-02-14 DIAGNOSIS — E1151 Type 2 diabetes mellitus with diabetic peripheral angiopathy without gangrene: Secondary | ICD-10-CM | POA: Diagnosis not present

## 2018-02-14 DIAGNOSIS — I69398 Other sequelae of cerebral infarction: Secondary | ICD-10-CM | POA: Diagnosis not present

## 2018-02-14 DIAGNOSIS — F319 Bipolar disorder, unspecified: Secondary | ICD-10-CM | POA: Diagnosis not present

## 2018-02-14 DIAGNOSIS — I2510 Atherosclerotic heart disease of native coronary artery without angina pectoris: Secondary | ICD-10-CM | POA: Diagnosis not present

## 2018-02-14 DIAGNOSIS — F0390 Unspecified dementia without behavioral disturbance: Secondary | ICD-10-CM | POA: Diagnosis not present

## 2018-02-14 DIAGNOSIS — I10 Essential (primary) hypertension: Secondary | ICD-10-CM | POA: Diagnosis not present

## 2018-02-15 DIAGNOSIS — I10 Essential (primary) hypertension: Secondary | ICD-10-CM | POA: Diagnosis not present

## 2018-02-15 DIAGNOSIS — F319 Bipolar disorder, unspecified: Secondary | ICD-10-CM | POA: Diagnosis not present

## 2018-02-15 DIAGNOSIS — E1151 Type 2 diabetes mellitus with diabetic peripheral angiopathy without gangrene: Secondary | ICD-10-CM | POA: Diagnosis not present

## 2018-02-15 DIAGNOSIS — I2510 Atherosclerotic heart disease of native coronary artery without angina pectoris: Secondary | ICD-10-CM | POA: Diagnosis not present

## 2018-02-15 DIAGNOSIS — I69398 Other sequelae of cerebral infarction: Secondary | ICD-10-CM | POA: Diagnosis not present

## 2018-02-15 DIAGNOSIS — F0390 Unspecified dementia without behavioral disturbance: Secondary | ICD-10-CM | POA: Diagnosis not present

## 2018-02-16 DIAGNOSIS — I69398 Other sequelae of cerebral infarction: Secondary | ICD-10-CM | POA: Diagnosis not present

## 2018-02-16 DIAGNOSIS — F319 Bipolar disorder, unspecified: Secondary | ICD-10-CM | POA: Diagnosis not present

## 2018-02-16 DIAGNOSIS — E1151 Type 2 diabetes mellitus with diabetic peripheral angiopathy without gangrene: Secondary | ICD-10-CM | POA: Diagnosis not present

## 2018-02-16 DIAGNOSIS — F0390 Unspecified dementia without behavioral disturbance: Secondary | ICD-10-CM | POA: Diagnosis not present

## 2018-02-16 DIAGNOSIS — I2510 Atherosclerotic heart disease of native coronary artery without angina pectoris: Secondary | ICD-10-CM | POA: Diagnosis not present

## 2018-02-16 DIAGNOSIS — I10 Essential (primary) hypertension: Secondary | ICD-10-CM | POA: Diagnosis not present

## 2018-02-20 DIAGNOSIS — I10 Essential (primary) hypertension: Secondary | ICD-10-CM | POA: Diagnosis not present

## 2018-02-20 DIAGNOSIS — F0390 Unspecified dementia without behavioral disturbance: Secondary | ICD-10-CM | POA: Diagnosis not present

## 2018-02-20 DIAGNOSIS — I2510 Atherosclerotic heart disease of native coronary artery without angina pectoris: Secondary | ICD-10-CM | POA: Diagnosis not present

## 2018-02-20 DIAGNOSIS — I69398 Other sequelae of cerebral infarction: Secondary | ICD-10-CM | POA: Diagnosis not present

## 2018-02-20 DIAGNOSIS — F319 Bipolar disorder, unspecified: Secondary | ICD-10-CM | POA: Diagnosis not present

## 2018-02-20 DIAGNOSIS — E1151 Type 2 diabetes mellitus with diabetic peripheral angiopathy without gangrene: Secondary | ICD-10-CM | POA: Diagnosis not present

## 2018-02-21 DIAGNOSIS — I69398 Other sequelae of cerebral infarction: Secondary | ICD-10-CM | POA: Diagnosis not present

## 2018-02-21 DIAGNOSIS — F0390 Unspecified dementia without behavioral disturbance: Secondary | ICD-10-CM | POA: Diagnosis not present

## 2018-02-21 DIAGNOSIS — I10 Essential (primary) hypertension: Secondary | ICD-10-CM | POA: Diagnosis not present

## 2018-02-21 DIAGNOSIS — F319 Bipolar disorder, unspecified: Secondary | ICD-10-CM | POA: Diagnosis not present

## 2018-02-21 DIAGNOSIS — E1151 Type 2 diabetes mellitus with diabetic peripheral angiopathy without gangrene: Secondary | ICD-10-CM | POA: Diagnosis not present

## 2018-02-21 DIAGNOSIS — I2510 Atherosclerotic heart disease of native coronary artery without angina pectoris: Secondary | ICD-10-CM | POA: Diagnosis not present

## 2018-02-22 DIAGNOSIS — I10 Essential (primary) hypertension: Secondary | ICD-10-CM | POA: Diagnosis not present

## 2018-02-22 DIAGNOSIS — I69398 Other sequelae of cerebral infarction: Secondary | ICD-10-CM | POA: Diagnosis not present

## 2018-02-22 DIAGNOSIS — E1151 Type 2 diabetes mellitus with diabetic peripheral angiopathy without gangrene: Secondary | ICD-10-CM | POA: Diagnosis not present

## 2018-02-22 DIAGNOSIS — F319 Bipolar disorder, unspecified: Secondary | ICD-10-CM | POA: Diagnosis not present

## 2018-02-22 DIAGNOSIS — I2510 Atherosclerotic heart disease of native coronary artery without angina pectoris: Secondary | ICD-10-CM | POA: Diagnosis not present

## 2018-02-22 DIAGNOSIS — F0390 Unspecified dementia without behavioral disturbance: Secondary | ICD-10-CM | POA: Diagnosis not present

## 2018-02-23 ENCOUNTER — Telehealth: Payer: Self-pay | Admitting: Family Medicine

## 2018-02-23 DIAGNOSIS — I2510 Atherosclerotic heart disease of native coronary artery without angina pectoris: Secondary | ICD-10-CM | POA: Diagnosis not present

## 2018-02-23 DIAGNOSIS — F319 Bipolar disorder, unspecified: Secondary | ICD-10-CM | POA: Diagnosis not present

## 2018-02-23 DIAGNOSIS — F0390 Unspecified dementia without behavioral disturbance: Secondary | ICD-10-CM | POA: Diagnosis not present

## 2018-02-23 DIAGNOSIS — I69398 Other sequelae of cerebral infarction: Secondary | ICD-10-CM | POA: Diagnosis not present

## 2018-02-23 DIAGNOSIS — I10 Essential (primary) hypertension: Secondary | ICD-10-CM | POA: Diagnosis not present

## 2018-02-23 DIAGNOSIS — E1151 Type 2 diabetes mellitus with diabetic peripheral angiopathy without gangrene: Secondary | ICD-10-CM | POA: Diagnosis not present

## 2018-02-23 NOTE — Telephone Encounter (Signed)
Patient has appointment scheduled for 02/27/2018 at 10:30 am.

## 2018-02-23 NOTE — Telephone Encounter (Signed)
Left message to return call to office to schedule ov for high blood pressure.

## 2018-02-23 NOTE — Telephone Encounter (Signed)
Copied from [place] [id number]. Topic: Quick Communication - Home Health Verbal Orders >> Feb 23, 2018 11:09 AM Rayann Heman wrote: Adriane calling from [facility] stated that she is discharging the pt from all services. Adriane also stated that pt blood pressure has been running high. 1/21: 188/100 1/22:152/91 1/27:155/87 1/28:183/74 1/29:158/82.

## 2018-02-27 ENCOUNTER — Encounter: Payer: Self-pay | Admitting: Family Medicine

## 2018-02-27 ENCOUNTER — Other Ambulatory Visit: Payer: Self-pay

## 2018-02-27 ENCOUNTER — Ambulatory Visit (INDEPENDENT_AMBULATORY_CARE_PROVIDER_SITE_OTHER): Payer: Medicare Other | Admitting: Family Medicine

## 2018-02-27 VITALS — BP 160/90 | HR 75 | Temp 98.3°F | Resp 16 | Wt 136.2 lb

## 2018-02-27 DIAGNOSIS — R2681 Unsteadiness on feet: Secondary | ICD-10-CM | POA: Diagnosis not present

## 2018-02-27 DIAGNOSIS — R531 Weakness: Secondary | ICD-10-CM

## 2018-02-27 DIAGNOSIS — E876 Hypokalemia: Secondary | ICD-10-CM

## 2018-02-27 DIAGNOSIS — I10 Essential (primary) hypertension: Secondary | ICD-10-CM | POA: Diagnosis not present

## 2018-02-27 DIAGNOSIS — S91209A Unspecified open wound of unspecified toe(s) with damage to nail, initial encounter: Secondary | ICD-10-CM

## 2018-02-27 MED ORDER — LOSARTAN POTASSIUM 25 MG PO TABS: 25.0000 mg | ORAL_TABLET | Freq: Every day | ORAL | 1 refills | Status: DC

## 2018-02-27 NOTE — Progress Notes (Signed)
ACUTE VISIT   HPI:  Chief Complaint  Patient presents with  . Hypertension  . Nail Problem     Right Great toe nail problem     Kathryn Lewis is a 73 y.o. female, who is here today with her daughter complaining of elevated BPs. Resident of [facility] at home, there has been a concern about elevated BPs. 150s-160/90s-low 100s. History of CAD, she denies chest pain, dyspnea, palpitations, or diaphoresis. She is currently on metoprolol succinate, recently increased from 25 mg to 50 mg. She is also on amlodipine 10 mg daily, Imdur 30 mg daily, and spironolactone 25 mg daily.  Lab Results  Component Value Date   CREATININE 0.86 12/18/2017   BUN 14 12/18/2017   NA 138 12/18/2017   K 4.3 12/18/2017   CL 103 12/18/2017   CO2 25 12/18/2017    Hyperkalemia, currently she is on K-Lor 10 mEq daily.  Right toenail traumatic avulsion, she thinks she hit something last night. She is not reporting pain. Her daughter wonders if she needs to arrange appointment with podiatrist.   She is also complaining about persistent fatigue, sleepy all the time. According to the daughter, this is not new problem. She has history of OSA, anxiety, and unstable gait, seizure disorder,CVA, and bipolar disorder. Frequent falls due to unstable gait. She is undergoing PT. She also has an aide during the day.   Review of Systems  Constitutional: Positive for fatigue. Negative for appetite change and fever.  Eyes: Negative for redness and visual disturbance.  Respiratory: Negative for cough, shortness of breath and wheezing.   Cardiovascular: Negative for chest pain, palpitations and leg swelling.  Gastrointestinal: Negative for abdominal pain, nausea and vomiting.       Negative for changes in bowel habits.  Genitourinary: Negative for decreased urine volume, dysuria and hematuria.  Musculoskeletal: Positive for gait problem. Negative for joint swelling.  Neurological: Negative for  syncope, weakness and headaches.  Psychiatric/Behavioral: Negative for hallucinations. The patient is nervous/anxious.      Current Outpatient Medications on File Prior to Visit  Medication Sig Dispense Refill  . albuterol (PROVENTIL HFA;VENTOLIN HFA) 108 (90 Base) MCG/ACT inhaler Inhale 2 puffs into the lungs every 6 (six) hours as needed for wheezing or shortness of breath. (Patient taking differently: Inhale 2 puffs into the lungs 4 (four) times daily as needed for wheezing or shortness of breath. ) 1 Inhaler 6  . amLODipine (NORVASC) 10 MG tablet Take 1 tablet (10 mg total) by mouth daily. 90 tablet 2  . aspirin EC 81 MG tablet Take 81 mg by mouth daily.    . Cholecalciferol (VITAMIN D) 2000 units CAPS Take 2,000 capsules by mouth daily.     . ciprofloxacin (CIPRO) 250 MG tablet Take 250 mg by mouth 2 (two) times daily.    . clopidogrel (PLAVIX) 75 MG tablet Take 1 tablet (75 mg total) by mouth daily. 90 tablet 2  . ezetimibe (ZETIA) 10 MG tablet Take 1 tablet (10 mg total) by mouth daily. 90 tablet 3  . ferrous sulfate 325 (65 FE) MG tablet Take 650 mg by mouth daily.     . fluticasone (FLONASE) 50 MCG/ACT nasal spray Place 2 sprays into both nostrils daily. 16 g 6  . furosemide (LASIX) 40 MG tablet Take 40 mg by mouth daily as needed (water retention).    . isosorbide mononitrate (IMDUR) 30 MG 24 hr tablet Take 30 mg by mouth daily.     Marland Kitchen  lamoTRIgine (LAMICTAL) 200 MG tablet Take 300 mg by mouth See admin instructions. Take 1 1/2 tablets (300 mg) by mouth twice daily - 8am and 2pm    . metoprolol tartrate (LOPRESSOR) 25 MG tablet Take 1 tablet (25 mg total) by mouth 2 (two) times daily. 180 tablet 2  . Omega-3 Fatty Acids (FISH OIL) 1000 MG CAPS Take 2,000 mg by mouth daily.    . phenytoin (DILANTIN) 100 MG ER capsule T 3 CAPS (300MG ) PO HS ND (Patient taking differently: Take 300 mg by mouth at bedtime. ) 90 capsule 10  . phenytoin (DILANTIN) 30 MG ER capsule Take 1 capsule (30 mg total)  by mouth at bedtime. 30 capsule 0  . rosuvastatin (CRESTOR) 40 MG tablet Take 1 tablet (40 mg total) by mouth daily. (Patient taking differently: Take 40 mg by mouth every evening. ) 90 tablet 3  . Spacer/Aero Chamber Mouthpiece MISC 1 Device by Does not apply route daily as needed. 1 each 0  . spironolactone (ALDACTONE) 50 MG tablet Take 1 tablet (50 mg total) by mouth daily. 30 tablet 11  . tiZANidine (ZANAFLEX) 4 MG tablet Take 1 tablet (4 mg total) by mouth every 8 (eight) hours as needed for muscle spasms. 60 tablet 0   No current facility-administered medications on file prior to visit.      Past Medical History:  Diagnosis Date  . Allergy   . Arthritis   . Bipolar 1 disorder (Kathryn Lewis)   . Blood transfusion without reported diagnosis   . Carotid artery stenosis    50-69% bilateral ICA stenoses by velocity criteria but no visible plaque and ICA/CCA ratio 1.72 on right and 1.38 on left  . Coronary artery disease   . Depression   . Diabetes mellitus without complication ([place])   . Hypertension   . Left adrenal mass ([facility])    2.5 cm L adrenal mass on CT per note of Dr. Seleta Rhymes 05/20/2014  . MI, old   . PAD (peripheral artery disease) ([place])    03/2014 - CT aortogram with lower extremity runoff (mild to moderate disease in common iliac arteries, complete occulusion of her bilateral external iliac arteries with heavily disease common femoral arteries. She also has disease to her SFAs bilaterally. Popliteal arteries and tibial vessel runoff appears to be adequate)  . Seizures ([place])   . Stroke [facility])    Allergies  Allergen Reactions  . Amoxicillin Other (See Comments)    colitis  . Augmentin [Amoxicillin-Pot Clavulanate] Other (See Comments)    colitis  . Cephalexin Other (See Comments)    colitis  . Erythromycin Other (See Comments)    colitis  . Niacin And Related Other (See Comments)    colitis  . Cefadroxil Other (See Comments)    Reported by [facility] 2013 - unknown reaction  .  Nitrofurantoin Macrocrystal Other (See Comments)    Reported by [facility] 2013 - unknown reaction    Social History   Socioeconomic History  . Marital status: Single    Spouse name: Not on file  . Number of children: Not on file  . Years of education: Not on file  . Highest education level: Not on file  Occupational History  . Not on file  Social Needs  . Financial resource strain: Not on file  . Food insecurity:    Worry: Not on file    Inability: Not on file  . Transportation needs:    Medical: Not on file  Non-medical: Not on file  Tobacco Use  . Smoking status: Former Smoker    Packs/day: 1.00    Types: Cigarettes    Last attempt to quit: 2015    Years since quitting: 5.0  . Smokeless tobacco: Never Used  Substance and Sexual Activity  . Alcohol use: No  . Drug use: No  . Sexual activity: Not on file  Lifestyle  . Physical activity:    Days per week: Not on file    Minutes per session: Not on file  . Stress: Not on file  Relationships  . Social connections:    Talks on phone: Not on file    Gets together: Not on file    Attends religious service: Not on file    Active member of club or organization: Not on file    Attends meetings of clubs or organizations: Not on file    Relationship status: Not on file  Other Topics Concern  . Not on file  Social History Narrative   Pt lives in assisted living facility   Has 2 adult children   College   Worked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    Vitals:   02/27/18 1028  BP: (!) 160/90  Pulse: 75  Resp: 16  Temp: 98.3 F (36.8 C)  SpO2: 96%   Body mass index is 25.73 kg/m.   Physical Exam  Nursing note and vitals reviewed. Constitutional: She appears well-developed and well-nourished. No distress.  HENT:  Head: Normocephalic and atraumatic.  Mouth/Throat: Oropharynx is clear and moist and mucous membranes are normal.  Eyes: Pupils are equal, round, and reactive to light. Conjunctivae are normal.    Cardiovascular: Normal rate and regular rhythm.  No murmur heard. DP and PT pulses present, bilateral.  Respiratory: Effort normal and breath sounds normal. No respiratory distress.  GI: Soft. She exhibits no mass. There is no abdominal tenderness.  Musculoskeletal:        General: No edema.  Lymphadenopathy:    She has no cervical adenopathy.  Neurological: She is alert. She has normal strength. No cranial nerve deficit. Gait abnormal.  Disoriented in time.  Oriented in place and person. She is in a wheelchair.  Skin: Skin is warm. No rash noted. No erythema.  Right great toe toenail with partial avulsion, no bleeding and no tenderness. Debris in between toenail and bed nail. No tenderness upon palpation.  Psychiatric: She has a normal mood and affect.  Well groomed, good eye contact.      ASSESSMENT AND PLAN:   Ms. Amandalee was seen today for hypertension and nail problem.  Orders Placed This Encounter  Procedures  . Basic metabolic panel    Generalized weakness This is a chronic complaint. We discussed possible etiologies, including some of her chronic medical problems as well as medications. Fall prevention discussed.  Essential hypertension BP seems to be poorly controlled. We discussed options, she agrees with trying losartan 25 mg.  We discussed some side effects. No changes in amlodipine, spironolactone, metoprolol, or Imdur. Continue monitoring BP, goal <150/90. Continue low-salt diet. We will check BMP in 7 days. Follow-up in 2 to 3 months, before if needed.  Hypokalemia Recommend discontinuing K-Lor 10 mEq. Caution advised against potassium rich food intake. BMP in 7 days, further recommendation will be given accordingly.  Unstable gait Fall precautions discussed. We discussed some of her medications side effects.  Continue PT.  Traumatic avulsion of nail plate of toe, initial encounter I do not think podiatrist evaluation  is needed at this  time. Her daughter could trim toenail down periodically, eventually toenail will fall. Follow-up as needed.    Return in about 3 months (around 05/28/2018) for HTN.      G. Martinique, MD  [facility] [place] office.

## 2018-02-27 NOTE — Patient Instructions (Signed)
A few things to remember from today's visit:   Essential hypertension - Plan: losartan (COZAAR) 25 MG tablet  Stop potassium and start Losartan.  Please be sure medication list is accurate. If a new problem present, please set up appointment sooner than planned today.

## 2018-02-27 NOTE — Assessment & Plan Note (Signed)
Recommend discontinuing K-Lor 10 mEq. Caution advised against potassium rich food intake. BMP in 7 days, further recommendation will be given accordingly.

## 2018-02-27 NOTE — Assessment & Plan Note (Signed)
This is a chronic complaint. We discussed possible etiologies, including some of her chronic medical problems as well as medications. Fall prevention discussed.

## 2018-02-27 NOTE — Assessment & Plan Note (Signed)
BP seems to be poorly controlled. We discussed options, she agrees with trying losartan 25 mg.  We discussed some side effects. No changes in amlodipine, spironolactone, metoprolol, or Imdur. Continue monitoring BP, goal <150/90. Continue low-salt diet. We will check BMP in 7 days. Follow-up in 2 to 3 months, before if needed.

## 2018-02-27 NOTE — Assessment & Plan Note (Signed)
Fall precautions discussed. We discussed some of her medications side effects.  Continue PT.

## 2018-03-08 ENCOUNTER — Telehealth: Payer: Self-pay | Admitting: *Deleted

## 2018-03-08 NOTE — Telephone Encounter (Signed)
Returned call to patient, spoke with Kathryn Lewis and she stated that she doesn't know who called or what they called for. Didn't not see any note about anyone calling them on yesterday. Kathryn Lewis stated that she would listen to message again and call office back. Copied from [place]. Topic: General - Call Back - No Documentation >> Mar 08, 2018  9:12 AM [name] wrote: Reason for CRM:   Pt calling - states she missed a call from the office yesterday that simply said to call back.  Pt can be reached at [phone]

## 2018-03-13 ENCOUNTER — Other Ambulatory Visit: Payer: Self-pay

## 2018-03-13 ENCOUNTER — Encounter: Payer: Self-pay | Admitting: Neurology

## 2018-03-13 ENCOUNTER — Ambulatory Visit (INDEPENDENT_AMBULATORY_CARE_PROVIDER_SITE_OTHER): Payer: Medicare Other | Admitting: Neurology

## 2018-03-13 VITALS — BP 160/82 | HR 75 | Ht 61.0 in | Wt 136.0 lb

## 2018-03-13 DIAGNOSIS — G629 Polyneuropathy, unspecified: Secondary | ICD-10-CM

## 2018-03-13 DIAGNOSIS — G40209 Localization-related (focal) (partial) symptomatic epilepsy and epileptic syndromes with complex partial seizures, not intractable, without status epilepticus: Secondary | ICD-10-CM

## 2018-03-13 MED ORDER — LAMOTRIGINE 200 MG PO TABS: 300.0000 mg | ORAL_TABLET | ORAL | 3 refills | Status: DC

## 2018-03-13 MED ORDER — GABAPENTIN 300 MG PO CAPS: ORAL_CAPSULE | ORAL | 3 refills | Status: DC

## 2018-03-13 MED ORDER — PHENYTOIN SODIUM EXTENDED 100 MG PO CAPS: ORAL_CAPSULE | ORAL | 3 refills | Status: DC

## 2018-03-13 MED ORDER — PHENYTOIN SODIUM EXTENDED 30 MG PO CAPS: 30.0000 mg | ORAL_CAPSULE | Freq: Every day | ORAL | 0 refills | Status: DC

## 2018-03-13 NOTE — Patient Instructions (Addendum)
1. Start Gabapentin 300mg : Take 1 capsule every night  2. Continue Lamotrigine 200mg : Take 1.5 tablets twice a day and Phenytoin 330mg  daily  3. Follow-up in 1 year, call for any changes  Seizure Precautions: 1. If medication has been prescribed for you to prevent seizures, take it exactly as directed.  Do not stop taking the medicine without talking to your doctor first, even if you have not had a seizure in a long time.   2. Avoid activities in which a seizure would cause danger to yourself or to others.  Don't operate dangerous machinery, swim alone, or climb in high or dangerous places, such as on ladders, roofs, or girders.  Do not drive unless your doctor says you may.  3. If you have any warning that you may have a seizure, lay down in a safe place where you can't hurt yourself.    4.  No driving for 6 months from last seizure, as per [facility].   Please refer to the following link on the [place] website for more information: http://www.epilepsyfoundation.org/answerplace/Social/driving/drivingu.cfm   5.  Maintain good sleep hygiene. Avoid alcohol.  6.  Contact your doctor if you have any problems that may be related to the medicine you are taking.  7.  Call 911 and bring the patient back to the ED if:        A.  The seizure lasts longer than 5 minutes.       B.  The patient doesn't awaken shortly after the seizure  C.  The patient has new problems such as difficulty seeing, speaking or moving  D.  The patient was injured during the seizure  E.  The patient has a temperature over 102 F (39C)  F.  The patient vomited and now is having trouble breathing

## 2018-03-13 NOTE — Progress Notes (Signed)
NEUROLOGY FOLLOW UP OFFICE NOTE  Kathryn Lewis [id number] 1945/09/26  HISTORY OF PRESENT ILLNESS: I had the pleasure of seeing Kathryn Lewis in follow-up in the neurology clinic on 03/13/2018. The patient was last seen a year ago for seizures. She is again accompanied by her daughter who helps supplement the history today. She and her daughter continue to deny any seizures since 2016. She is on Lamotrigine 200mg  1.5 tabs BID and Phenytoin 330mg  daily without side effects. Her daughter feels the Lamotrigine helps with mood stabilization as well. She is a poor historian and mostly answers "I don't know" to questions, her daughter provides majority of answers. She has had several more falls since her last visit, she has a Actuary who watches her. She is again noted to have mouth and head movements seen with tardive dyskinesia, she is not on any antipsychotic medication but her daughter does note she was on unrecalled antipsychotics in the past. She is having more issues with pain in her legs (chronic), and is mostly wheelchair-bound.   History on Initial Assessment 03/18/2017: This is a pleasant 73 year old ambidextrous right-hand dominant woman with a history of hypertension, diabetes, peripheral vascular disease, COPD, sleep apnea on CPAP, CAD, bipolar disorder, stroke in 2015 with subsequent seizures, presenting to establish care. She had previously been seeing neurologist Dr. Chipper Herb in [place], [place], records unavailable for review. She and her daughter report that she had a stroke in 2015 which mostly affected her vision and memory. She cannot read. She can write her name and sometimes a word, but something would write a different word. The first seizure occurred in 2016, she recalls that everything got loud and mixed up, she tried calling her son but per daughter was "hollering out loud" then became unconscious. They report possibly only 2 seizures at that time, no seizures since 2016. She is taking  Lamotrigine 200mg  1.5 tabs BID and Dilantin 330mg  qhs with no side effects. She had previously been living with her daughter, but due to her daughter medical issues, the patient opted to move to [facility] last year. She has had memory issues since the stroke, her daughter manages bills. Medications are administered at [facility]. She does not drive. She reports bilateral leg pain and deep pain radiating to her lower back. She also has a diagnosis of sciatica, "I think the left one." She has peripheral artery disease and sees Vascular.   She denies any olfactory/gustatory hallucinations, deja vu, rising epigastric sensation, focal numbness/tingling/weakness, myoclonic jerks. She denies any headaches, dizziness, diplopia, neck/back pain, bowel/bladder dysfunction.   Epilepsy Risk Factors:  History of large right posterior temporal and medial occipital infarct with encephalomalacia and gliosis. Otherwise she had a normal birth and early development.  There is no history of febrile convulsions, CNS infections such as meningitis/encephalitis, significant traumatic brain injury, neurosurgical procedures, or family history of seizures.   PAST MEDICAL HISTORY: Past Medical History:  Diagnosis Date  . Allergy   . Arthritis   . Bipolar 1 disorder ([place])   . Blood transfusion without reported diagnosis   . Carotid artery stenosis    50-69% bilateral ICA stenoses by velocity criteria but no visible plaque and ICA/CCA ratio 1.72 on right and 1.38 on left  . Coronary artery disease   . Depression   . Diabetes mellitus without complication ([place])   . Hypertension   . Left adrenal mass ([facility])    2.5 cm L adrenal mass on CT per note  of Dr. Seleta Rhymes 05/20/2014  . MI, old   . PAD (peripheral artery disease) ([place])    03/2014 - CT aortogram with lower extremity runoff (mild to moderate disease in common iliac arteries, complete occulusion of her bilateral external iliac arteries with heavily disease common  femoral arteries. She also has disease to her SFAs bilaterally. Popliteal arteries and tibial vessel runoff appears to be adequate)  . Seizures ([place])   . Stroke [facility])     MEDICATIONS: Current Outpatient Medications on File Prior to Visit  Medication Sig Dispense Refill  . albuterol (PROVENTIL HFA;VENTOLIN HFA) 108 (90 Base) MCG/ACT inhaler Inhale 2 puffs into the lungs every 6 (six) hours as needed for wheezing or shortness of breath. (Patient taking differently: Inhale 2 puffs into the lungs 4 (four) times daily as needed for wheezing or shortness of breath. ) 1 Inhaler 6  . amLODipine (NORVASC) 10 MG tablet Take 1 tablet (10 mg total) by mouth daily. 90 tablet 2  . aspirin EC 81 MG tablet Take 81 mg by mouth daily.    . Cholecalciferol (VITAMIN D) 2000 units CAPS Take 2,000 capsules by mouth daily.     . ciprofloxacin (CIPRO) 250 MG tablet Take 250 mg by mouth 2 (two) times daily.    . clopidogrel (PLAVIX) 75 MG tablet Take 1 tablet (75 mg total) by mouth daily. 90 tablet 2  . ezetimibe (ZETIA) 10 MG tablet Take 1 tablet (10 mg total) by mouth daily. 90 tablet 3  . ferrous sulfate 325 (65 FE) MG tablet Take 650 mg by mouth daily.     . fluticasone (FLONASE) 50 MCG/ACT nasal spray Place 2 sprays into both nostrils daily. 16 g 6  . furosemide (LASIX) 40 MG tablet Take 40 mg by mouth daily as needed (water retention).    . isosorbide mononitrate (IMDUR) 30 MG 24 hr tablet Take 30 mg by mouth daily.     Marland Kitchen lamoTRIgine (LAMICTAL) 200 MG tablet Take 300 mg by mouth See admin instructions. Take 1 1/2 tablets (300 mg) by mouth twice daily - 8am and 2pm    . losartan (COZAAR) 25 MG tablet Take 1 tablet (25 mg total) by mouth daily. 90 tablet 1  . metoprolol tartrate (LOPRESSOR) 25 MG tablet Take 1 tablet (25 mg total) by mouth 2 (two) times daily. 180 tablet 2  . Omega-3 Fatty Acids (FISH OIL) 1000 MG CAPS Take 2,000 mg by mouth daily.    . phenytoin (DILANTIN) 100 MG ER capsule T 3 CAPS (300MG ) PO HS  ND (Patient taking differently: Take 300 mg by mouth at bedtime. ) 90 capsule 10  . phenytoin (DILANTIN) 30 MG ER capsule Take 1 capsule (30 mg total) by mouth at bedtime. 30 capsule 0  . rosuvastatin (CRESTOR) 40 MG tablet Take 1 tablet (40 mg total) by mouth daily. (Patient taking differently: Take 40 mg by mouth every evening. ) 90 tablet 3  . Spacer/Aero Chamber Mouthpiece MISC 1 Device by Does not apply route daily as needed. 1 each 0  . spironolactone (ALDACTONE) 50 MG tablet Take 1 tablet (50 mg total) by mouth daily. 30 tablet 11  . tiZANidine (ZANAFLEX) 4 MG tablet Take 1 tablet (4 mg total) by mouth every 8 (eight) hours as needed for muscle spasms. 60 tablet 0   No current facility-administered medications on file prior to visit.     ALLERGIES: Allergies  Allergen Reactions  . Amoxicillin Other (See Comments)    colitis  . Augmentin [  Amoxicillin-Pot Clavulanate] Other (See Comments)    colitis  . Cephalexin Other (See Comments)    colitis  . Erythromycin Other (See Comments)    colitis  . Niacin And Related Other (See Comments)    colitis  . Cefadroxil Other (See Comments)    Reported by [facility] 2013 - unknown reaction  . Nitrofurantoin Macrocrystal Other (See Comments)    Reported by [facility] 2013 - unknown reaction    FAMILY HISTORY: Family History  Problem Relation Age of Onset  . Hypertension Mother   . Arthritis Mother   . Diabetes Mother   . Early death Mother   . Hyperlipidemia Mother   . Stroke Mother   . Hypertension Father   . Alcohol abuse Father   . Early death Father   . Hyperlipidemia Father   . COPD Father   . Heart attack Father   . Hypertension Sister   . Birth defects Brother   . Depression Brother   . Hyperlipidemia Brother   . Hypertension Brother   . Arthritis Daughter   . Asthma Daughter   . COPD Daughter   . Arthritis Son   . COPD Son   . Hypertension Son   . Hyperlipidemia Son   . Heart attack Son   . Arthritis  Maternal Grandmother   . Cancer Maternal Grandmother     SOCIAL HISTORY: Social History   Socioeconomic History  . Marital status: Single    Spouse name: Not on file  . Number of children: Not on file  . Years of education: Not on file  . Highest education level: Not on file  Occupational History  . Not on file  Social Needs  . Financial resource strain: Not on file  . Food insecurity:    Worry: Not on file    Inability: Not on file  . Transportation needs:    Medical: Not on file    Non-medical: Not on file  Tobacco Use  . Smoking status: Former Smoker    Packs/day: 1.00    Types: Cigarettes    Last attempt to quit: 2015    Years since quitting: 5.1  . Smokeless tobacco: Never Used  Substance and Sexual Activity  . Alcohol use: No  . Drug use: No  . Sexual activity: Not on file  Lifestyle  . Physical activity:    Days per week: Not on file    Minutes per session: Not on file  . Stress: Not on file  Relationships  . Social connections:    Talks on phone: Not on file    Gets together: Not on file    Attends religious service: Not on file    Active member of club or organization: Not on file    Attends meetings of clubs or organizations: Not on file    Relationship status: Not on file  . Intimate partner violence:    Fear of current or ex partner: Not on file    Emotionally abused: Not on file    Physically abused: Not on file    Forced sexual activity: Not on file  Other Topics Concern  . Not on file  Social History Narrative   Pt lives in assisted living facility   Has 2 adult children   College   Worked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    REVIEW OF SYSTEMS: Constitutional: No fevers, chills, or sweats, no generalized fatigue, change in appetite Eyes: No visual changes, double vision, eye pain Ear, nose  and throat: No hearing loss, ear pain, nasal congestion, sore throat Cardiovascular: No chest pain, palpitations Respiratory:  No shortness of breath at rest or  with exertion, wheezes GastrointestinaI: No nausea, vomiting, diarrhea, abdominal pain, fecal incontinence Genitourinary:  No dysuria, urinary retention or frequency Musculoskeletal:  No neck pain, back pain Integumentary: No rash, pruritus, skin lesions Neurological: as above Psychiatric: No depression, insomnia, anxiety Endocrine: No palpitations, fatigue, diaphoresis, mood swings, change in appetite, change in weight, increased thirst Hematologic/Lymphatic:  No anemia, purpura, petechiae. Allergic/Immunologic: no itchy/runny eyes, nasal congestion, recent allergic reactions, rashes  PHYSICAL EXAM: Vitals:   03/13/18 1501  BP: (!) 160/82  Pulse: 75  SpO2: 98%   General: No acute distress, sitting on wheelchair. She is again noted to have mouth and head movements suggestive of tardive dyskinesias Head:  Normocephalic/atraumatic Neck: supple, no paraspinal tenderness, full range of motion Heart:  Regular rate and rhythm Lungs:  Clear to auscultation bilaterally Back: No paraspinal tenderness Skin/Extremities: No rash, no edema Neurological Exam: alert and oriented to person, place. States year is 33. No aphasia or dysarthria. Fund of knowledge is reduced.  Recent and remote memory are impaired.  Attention and concentration are reduced. Difficulty naming button, able to name pen. Cranial nerves: Pupils equal, round, reactive to light. Extraocular movements intact with no nystagmus. Left homonymous hemianopia (unchanged). Facial sensation intact. No facial asymmetry. Tongue, uvula, palate midline.  Motor: Bulk and tone normal, muscle strength 5/5 throughout with no pronator drift.  Sensation to light touch intact.  No extinction to double simultaneous stimulation.  Deep tendon reflexes 2+ throughout, toes downgoing. Finger to nose testing intact.  Gait significantly impaired, able to stand with 1-person assist and could only take a few steps.   IMPRESSION: This is a pleasant 73 yo RH woman  with a history of hypertension, diabetes, peripheral vascular disease, COPD, sleep apnea on CPAP, CAD, bipolar disorder, stroke in 2015 with subsequent seizures, who continues to remain seizure-free since 2016 on Lamictal 200mg  1.5 tabs BID and Dilantin 330mg  qhs without side effects. Continue current medications, Lamictal also helping with mood. She is reporting more leg pain and agrees to start low dose gabapentin 300mg  qhs, side effects discussed. She has significant gait difficulty needing assistance, also noted to have ?tardive dyskinesia. She is not on any antipsychotic medication currently. Continue supportive care. She will follow-up in 1 year, they know to call for any changes.   Thank you for allowing me to participate in his care.  Please do not hesitate to call for any questions or concerns.  The duration of this appointment visit was 30 minutes of face-to-face time with the patient.  Greater than 50% of this time was spent in counseling, explanation of diagnosis, planning of further management, and coordination of care.   Ellouise Newer, M.D.   CC: Dr. Martinique

## 2018-03-17 ENCOUNTER — Non-Acute Institutional Stay: Payer: Medicare Other | Admitting: Adult Health Nurse Practitioner

## 2018-03-17 DIAGNOSIS — Z515 Encounter for palliative care: Secondary | ICD-10-CM | POA: Diagnosis not present

## 2018-03-17 NOTE — Progress Notes (Signed)
[facility] Consult Note Telephone: [phone]  Fax: [phone] 03/17/2018  PATIENT NAME: Kathryn Lewis DOB: 24-May-1945 MRN: [id number]  PRIMARY CARE PROVIDER:   Martinique, Betty G, MD  REFERRING PROVIDER:  Martinique, Betty G, MD [place], [place] [address]  RESPONSIBLE PARTY:   Emalyn Schou (daughter) [phone]       RECOMMENDATIONS and PLAN:  1.  Recurrent falls. Continued staff assistance with transfers and ADLs . Though patient does state that she does not like asking for help.  Patient recently discharged from PT services as reaching her max potential.  Patient does admit to multiple falls.  States that she does not have pain unless she stands or walks.  The pain is in her right hip. States that she has had this for many years.  2.  Cough and Congestion.  Patient had gone to urgent care on 03/15/2018 due to cough and SOB.  Patient diagnosed with bronchitis and conservative supportive care recommended.  Patient does appear to be feeling better today as she states "I am back to my meanness."  Though she does not remember going to the urgent care  3.  Advanced Care Planning. DNR in place.  No MOST form on the chart.  Continued discussions with family needed  I spent 30 minutes providing this consultation,  from 1:15 to 1:45. More than 50% of the time in this consultation was spent coordinating communication.   HISTORY OF PRESENT ILLNESS: Kathryn Lewis is a 73 y.o. year old female with multiple medical problems including HTN, HLD, CAD, s/pCVA, gait disorder, STML, COPD, OSA,. Palliative Care was asked to help with symptom management and to address goals of care.   PHYSICAL EXAM:   General: patient sitting up in wheelchair in NAD Cardiovascular: regular rate and rhythm Pulmonary: clear ant fields Abdomen: soft, nontender, + bowel sounds GU: no suprapubic tenderness Extremities: no edema, no joint  deformities Neurological: Weakness but otherwise nonfocal  CODE STATUS: DNR  PPS: 40% HOSPICE ELIGIBILITY/DIAGNOSIS: TBD  PAST MEDICAL HISTORY:  Past Medical History:  Diagnosis Date  . Allergy   . Arthritis   . Bipolar 1 disorder ([place])   . Blood transfusion without reported diagnosis   . Carotid artery stenosis    50-69% bilateral ICA stenoses by velocity criteria but no visible plaque and ICA/CCA ratio 1.72 on right and 1.38 on left  . Coronary artery disease   . Depression   . Diabetes mellitus without complication ([place])   . Hypertension   . Left adrenal mass ([facility])    2.5 cm L adrenal mass on CT per note of Dr. Seleta Rhymes 05/20/2014  . MI, old   . PAD (peripheral artery disease) ([place])    03/2014 - CT aortogram with lower extremity runoff (mild to moderate disease in common iliac arteries, complete occulusion of her bilateral external iliac arteries with heavily disease common femoral arteries. She also has disease to her SFAs bilaterally. Popliteal arteries and tibial vessel runoff appears to be adequate)  . Seizures ([place])   . Stroke [facility])     SOCIAL HX:  Social History   Tobacco Use  . Smoking status: Former Smoker    Packs/day: 1.00    Types: Cigarettes    Last attempt to quit: 2015    Years since quitting: 5.1  . Smokeless tobacco: Never Used  Substance Use Topics  . Alcohol use: No    ALLERGIES:  Allergies  Allergen Reactions  .  Amoxicillin Other (See Comments)    colitis  . Augmentin [Amoxicillin-Pot Clavulanate] Other (See Comments)    colitis  . Cephalexin Other (See Comments)    colitis  . Erythromycin Other (See Comments)    colitis  . Niacin And Related Other (See Comments)    colitis  . Cefadroxil Other (See Comments)    Reported by [facility] 2013 - unknown reaction  . Nitrofurantoin Macrocrystal Other (See Comments)    Reported by [facility] 2013 - unknown reaction     PERTINENT MEDICATIONS:  Outpatient Encounter Medications as of 03/17/2018   Medication Sig  . albuterol (PROVENTIL HFA;VENTOLIN HFA) 108 (90 Base) MCG/ACT inhaler Inhale 2 puffs into the lungs every 6 (six) hours as needed for wheezing or shortness of breath. (Patient taking differently: Inhale 2 puffs into the lungs 4 (four) times daily as needed for wheezing or shortness of breath. )  . amLODipine (NORVASC) 10 MG tablet Take 1 tablet (10 mg total) by mouth daily.  Marland Kitchen aspirin EC 81 MG tablet Take 81 mg by mouth daily.  . Cholecalciferol (VITAMIN D) 2000 units CAPS Take 2,000 capsules by mouth daily.   . ciprofloxacin (CIPRO) 250 MG tablet Take 250 mg by mouth 2 (two) times daily.  . clopidogrel (PLAVIX) 75 MG tablet Take 1 tablet (75 mg total) by mouth daily.  Marland Kitchen ezetimibe (ZETIA) 10 MG tablet Take 1 tablet (10 mg total) by mouth daily.  . ferrous sulfate 325 (65 FE) MG tablet Take 650 mg by mouth daily.   . fluticasone (FLONASE) 50 MCG/ACT nasal spray Place 2 sprays into both nostrils daily.  . furosemide (LASIX) 40 MG tablet Take 40 mg by mouth daily as needed (water retention).  . gabapentin (NEURONTIN) 300 MG capsule Take 1 capsule every night  . isosorbide mononitrate (IMDUR) 30 MG 24 hr tablet Take 30 mg by mouth daily.   Marland Kitchen lamoTRIgine (LAMICTAL) 200 MG tablet Take 1.5 tablets (300 mg total) by mouth See admin instructions. Take 1 1/2 tablets (300 mg) by mouth twice daily - 8am and 2pm  . losartan (COZAAR) 25 MG tablet Take 1 tablet (25 mg total) by mouth daily.  . metoprolol tartrate (LOPRESSOR) 25 MG tablet Take 1 tablet (25 mg total) by mouth 2 (two) times daily.  . Omega-3 Fatty Acids (FISH OIL) 1000 MG CAPS Take 2,000 mg by mouth daily.  . phenytoin (DILANTIN) 100 MG ER capsule T 3 CAPS (300MG ) PO HS ND  . phenytoin (DILANTIN) 30 MG ER capsule Take 1 capsule (30 mg total) by mouth at bedtime.  Marland Kitchen Spacer/Aero Chamber Mouthpiece MISC 1 Device by Does not apply route daily as needed. (Patient not taking: Reported on 03/13/2018)  . spironolactone (ALDACTONE) 50 MG  tablet Take 1 tablet (50 mg total) by mouth daily. (Patient not taking: Reported on 03/13/2018)  . tiZANidine (ZANAFLEX) 4 MG tablet Take 1 tablet (4 mg total) by mouth every 8 (eight) hours as needed for muscle spasms.   No facility-administered encounter medications on file as of 03/17/2018.      Keola Heninger Jenetta Downer, NP

## 2018-03-20 ENCOUNTER — Ambulatory Visit: Payer: Self-pay | Admitting: *Deleted

## 2018-03-20 NOTE — Telephone Encounter (Signed)
  Reason for Disposition . Caller has NON-URGENT medication question about med that PCP prescribed and triager unable to answer question  Answer Assessment - Initial Assessment Questions 1. SYMPTOMS: "Do you have any symptoms?"     yes 2. SEVERITY: If symptoms are present, ask "Are they mild, moderate or severe?"     moderate  Protocols used: MEDICATION QUESTION CALL-A-AH

## 2018-03-20 NOTE — Telephone Encounter (Signed)
Message from Bea Graff, NT sent at 03/20/2018 3:54 PM EST   Summary: stop medication    Pt states that Dr. Martinique stated that if the gabapentin (NEURONTIN) 300 MG capsule made her feel "off" that she will send a stop order to [facility]. Pt states it has been making her fall around since taking it. Fax#: [phone]         Daughter called stating the patient is not doing well on the gabapentin. She has fallen since starting this medication. Daughter states she as bruises on her shoulders and hip, softball size from the falls. Wants to know if the medication can be stopped.   She also is stating the that she thinks her mom has bronchitis. She took her to the [facility] last Thursday or Friday.  They put her on a Z pack. She has a congested cough and the daughter is using a warm air humidifier. She is afraid to give her anything for her cough because of her heart condition. She is is requesting an appointment with her pcp. Appointment scheduled. Routing to flow at [facility] at [place].

## 2018-03-21 ENCOUNTER — Ambulatory Visit (INDEPENDENT_AMBULATORY_CARE_PROVIDER_SITE_OTHER): Payer: Medicare Other

## 2018-03-21 ENCOUNTER — Encounter: Payer: Self-pay | Admitting: Family Medicine

## 2018-03-21 ENCOUNTER — Ambulatory Visit (INDEPENDENT_AMBULATORY_CARE_PROVIDER_SITE_OTHER): Payer: Medicare Other | Admitting: Family Medicine

## 2018-03-21 VITALS — BP 140/80 | HR 83 | Temp 97.9°F | Resp 16 | Wt 136.1 lb

## 2018-03-21 DIAGNOSIS — R296 Repeated falls: Secondary | ICD-10-CM

## 2018-03-21 DIAGNOSIS — R0600 Dyspnea, unspecified: Secondary | ICD-10-CM | POA: Diagnosis not present

## 2018-03-21 DIAGNOSIS — I10 Essential (primary) hypertension: Secondary | ICD-10-CM | POA: Diagnosis not present

## 2018-03-21 DIAGNOSIS — J441 Chronic obstructive pulmonary disease with (acute) exacerbation: Secondary | ICD-10-CM | POA: Diagnosis not present

## 2018-03-21 DIAGNOSIS — R05 Cough: Secondary | ICD-10-CM | POA: Diagnosis not present

## 2018-03-21 MED ORDER — METOPROLOL TARTRATE 50 MG PO TABS: 50.0000 mg | ORAL_TABLET | Freq: Two times a day (BID) | ORAL | 2 refills | Status: DC

## 2018-03-21 MED ORDER — ALBUTEROL SULFATE HFA 108 (90 BASE) MCG/ACT IN AERS: 2.0000 | INHALATION_SPRAY | Freq: Four times a day (QID) | RESPIRATORY_TRACT | 6 refills | Status: DC | PRN

## 2018-03-21 MED ORDER — FUROSEMIDE 20 MG PO TABS: 20.0000 mg | ORAL_TABLET | Freq: Every day | ORAL | 3 refills | Status: DC | PRN

## 2018-03-21 MED ORDER — PREDNISONE 20 MG PO TABS: 40.0000 mg | ORAL_TABLET | Freq: Every day | ORAL | 0 refills | Status: AC

## 2018-03-21 NOTE — Progress Notes (Signed)
ACUTE VISIT  HPI:  Chief Complaint  Patient presents with  . Cough  . Wheezing  . Fall    has had a lot of falls within last few weeks    Kathryn Lewis is a 73 y.o.female here today with caregiver complaining of respiratory symptoms. She is not sure about duration of symptoms nor caregiver.  She was evaluated on 01/13/2019 at [facility]. She was diagnosed with acute bronchitis and treated with antibiotic, Ceftin 250 mg bid x 10 days.  She is here today because still having symptoms. C/O  Productive cough,wheezing,and SOB. Hx of COPD. She has had Albuterol inh ,she does not have medication.  She denies associated fever,chills,sore throat,or unusual body aches.  Cough  This is a new problem. The current episode started 1 to 4 weeks ago. The problem has been unchanged. The cough is productive of sputum. Associated symptoms include nasal congestion, postnasal drip, rhinorrhea, shortness of breath and wheezing. Pertinent negatives include no chest pain, chills, ear congestion, ear pain, eye redness, fever, headaches, heartburn, hemoptysis, myalgias, rash or sore throat. The symptoms are aggravated by lying down. Her past medical history is significant for bronchitis, COPD and environmental allergies. There is no history of asthma.   No Hx of recent travel. No sick contact. No known insect bite.   OTC medications for this problem: None  Hypertension: Last visit antihypertensive medication were adjusted. It seems that she is still having elevated BPs. 160's/90's. Currently she is on Metoprolol Tartrate 25 mg bid,Spironolactone 50 mg daily,Cozaar 100 mg daily,and Imdur 30 mg daily  Negative for headache,chest pain,or edema.  Lab Results  Component Value Date   CREATININE 0.86 12/18/2017   BUN 14 12/18/2017   NA 138 12/18/2017   K 4.3 12/18/2017   CL 103 12/18/2017   CO2 25 12/18/2017    Since her last visit she has had several falls. History of  unstable gait, OA, and peripheral neuropathy. Despite of education she is still getting up from wheel chair. She states that she does not wait for help because she feels the urge to urinate,usually falls while trying to go to the bathroom. No serious injuries or head trauma.  She is on Furosemide 40 mg daily for edema.Denies orthopnea or PND.   Review of Systems  Constitutional: Positive for appetite change and fatigue. Negative for activity change, chills and fever.  HENT: Positive for congestion, postnasal drip and rhinorrhea. Negative for ear pain, mouth sores, sinus pressure and sore throat.   Eyes: Negative for discharge and redness.  Respiratory: Positive for cough, shortness of breath and wheezing. Negative for hemoptysis.   Cardiovascular: Negative for chest pain, palpitations and leg swelling.  Gastrointestinal: Negative for abdominal pain, diarrhea, heartburn, nausea and vomiting.  Genitourinary: Negative for decreased urine volume and hematuria.  Musculoskeletal: Positive for arthralgias and gait problem. Negative for myalgias.  Skin: Negative for rash.  Allergic/Immunologic: Positive for environmental allergies.  Neurological: Negative for weakness and headaches.    Current Outpatient Medications on File Prior to Visit  Medication Sig Dispense Refill  . amLODipine (NORVASC) 10 MG tablet Take 1 tablet (10 mg total) by mouth daily. 90 tablet 2  . aspirin EC 81 MG tablet Take 81 mg by mouth daily.    . cefUROXime (CEFTIN) 250 MG tablet Take by mouth.    . Cholecalciferol (VITAMIN D3) 50 MCG (2000 UT) capsule Take by mouth.    . clopidogrel (PLAVIX) 75 MG tablet Take 1  tablet (75 mg total) by mouth daily. 90 tablet 2  . ezetimibe (ZETIA) 10 MG tablet Take 1 tablet (10 mg total) by mouth daily. 90 tablet 3  . ferrous sulfate 325 (65 FE) MG tablet Take 650 mg by mouth daily.     . fluticasone (FLONASE) 50 MCG/ACT nasal spray Place 2 sprays into both nostrils daily. 16 g 6  .  isosorbide mononitrate (IMDUR) 30 MG 24 hr tablet Take 30 mg by mouth daily.     Marland Kitchen lamoTRIgine (LAMICTAL) 200 MG tablet Take 1.5 tablets (300 mg total) by mouth See admin instructions. Take 1 1/2 tablets (300 mg) by mouth twice daily - 8am and 2pm 270 tablet 3  . losartan (COZAAR) 25 MG tablet Take 1 tablet (25 mg total) by mouth daily. 90 tablet 1  . Omega-3 Fatty Acids (FISH OIL) 1000 MG CAPS Take 2,000 mg by mouth daily.    . phenytoin (DILANTIN) 100 MG ER capsule T 3 CAPS (300MG ) PO HS ND 270 capsule 3  . phenytoin (DILANTIN) 30 MG ER capsule Take 1 capsule (30 mg total) by mouth at bedtime. 30 capsule 0  . Spacer/Aero Chamber Mouthpiece MISC 1 Device by Does not apply route daily as needed. 1 each 0  . spironolactone (ALDACTONE) 50 MG tablet Take 1 tablet (50 mg total) by mouth daily. 30 tablet 11  . tiZANidine (ZANAFLEX) 4 MG tablet Take 1 tablet (4 mg total) by mouth every 8 (eight) hours as needed for muscle spasms. 60 tablet 0   No current facility-administered medications on file prior to visit.      Past Medical History:  Diagnosis Date  . Allergy   . Arthritis   . Bipolar 1 disorder ([place])   . Blood transfusion without reported diagnosis   . Carotid artery stenosis    50-69% bilateral ICA stenoses by velocity criteria but no visible plaque and ICA/CCA ratio 1.72 on right and 1.38 on left  . Coronary artery disease   . Depression   . Diabetes mellitus without complication ([place])   . Hypertension   . Left adrenal mass (HCC)    2.5 cm L adrenal mass on CT per note of Dr. Seleta Rhymes 05/20/2014  . MI, old   . PAD (peripheral artery disease) ([place])    03/2014 - CT aortogram with lower extremity runoff (mild to moderate disease in common iliac arteries, complete occulusion of her bilateral external iliac arteries with heavily disease common femoral arteries. She also has disease to her SFAs bilaterally. Popliteal arteries and tibial vessel runoff appears to be adequate)  . Seizures ([place])     . Stroke [facility])    Allergies  Allergen Reactions  . Influenza Vaccines Anaphylaxis  . Amoxicillin Other (See Comments)    colitis  . Augmentin [Amoxicillin-Pot Clavulanate] Other (See Comments)    colitis  . Cephalexin Other (See Comments)    colitis  . Erythromycin Other (See Comments)    colitis  . Niacin And Related Other (See Comments)    colitis  . Cefadroxil Other (See Comments)    Reported by [facility] 2013 - unknown reaction  . Nitrofurantoin Macrocrystal Other (See Comments)    Reported by [facility] 2013 - unknown reaction    Social History   Socioeconomic History  . Marital status: Single    Spouse name: Not on file  . Number of children: Not on file  . Years of education: Not on file  . Highest education level: Not on file  Occupational History  . Not on file  Social Needs  . Financial resource strain: Not on file  . Food insecurity:    Worry: Not on file    Inability: Not on file  . Transportation needs:    Medical: Not on file    Non-medical: Not on file  Tobacco Use  . Smoking status: Former Smoker    Packs/day: 1.00    Types: Cigarettes    Last attempt to quit: 2015    Years since quitting: 5.1  . Smokeless tobacco: Never Used  Substance and Sexual Activity  . Alcohol use: No  . Drug use: No  . Sexual activity: Not on file  Lifestyle  . Physical activity:    Days per week: Not on file    Minutes per session: Not on file  . Stress: Not on file  Relationships  . Social connections:    Talks on phone: Not on file    Gets together: Not on file    Attends religious service: Not on file    Active member of club or organization: Not on file    Attends meetings of clubs or organizations: Not on file    Relationship status: Not on file  Other Topics Concern  . Not on file  Social History Narrative   Pt lives in assisted living facility   Has 2 adult children   College   Worked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    Vitals:   03/21/18 1057   BP: 140/80  Pulse: 83  Resp: 16  Temp: 97.9 F (36.6 C)  SpO2: 95%   Body mass index is 25.72 kg/m.   Physical Exam  Nursing note and vitals reviewed. Constitutional: She appears well-developed. She does not appear ill. No distress.  HENT:  Head: Normocephalic and atraumatic.  Right Ear: Tympanic membrane, external ear and ear canal normal.  Left Ear: Tympanic membrane, external ear and ear canal normal.  Nose: Rhinorrhea present. Right sinus exhibits no maxillary sinus tenderness and no frontal sinus tenderness. Left sinus exhibits no maxillary sinus tenderness and no frontal sinus tenderness.  Mouth/Throat: Oropharynx is clear and moist and mucous membranes are normal.  Eyes: Conjunctivae are normal.  Cardiovascular: Normal rate and regular rhythm.  No murmur heard. Respiratory: Effort normal. No respiratory distress. She has wheezes. She has rhonchi. She has no rales.  Lymphadenopathy:    She has no cervical adenopathy.  Neurological: She is alert. She has normal strength.  Oriented in place and person. In a wheel chair.  Skin: Skin is warm. No rash noted. No erythema.  Psychiatric: Her mood appears anxious.  Well groomed, good eye contact.    ASSESSMENT AND PLAN:  Kathryn Lewis was seen today for cough, wheezing and fall.  Diagnoses and all orders for this visit:  Chronic obstructive pulmonary disease with acute exacerbation ([place]) After discussion of some side effects she agrees with Prednisone x 5 DAYS. Albuterol inh 2 puff every 6 hours for a week then as needed for wheezing or shortness of breath.  Already taking abx,recommend completing treatment.  -     predniSONE (DELTASONE) 20 MG tablet; Take 2 tablets (40 mg total) by mouth daily with breakfast for 5 days. -     albuterol (PROVENTIL HFA;VENTOLIN HFA) 108 (90 Base) MCG/ACT inhaler; Inhale 2 puffs into the lungs every 6 (six) hours as needed for wheezing or shortness of breath.  Essential hypertension BP  re-checked: 1665/80. Metoprolol Tartrate increased from 25 mg bid to  50 mg bid. Rest of meds unchanged. Continue monitoring BP. Low salt diet. F/U in 4 weeks.  -     metoprolol tartrate (LOPRESSOR) 50 MG tablet; Take 1 tablet (50 mg total) by mouth 2 (two) times daily.  Frequent falls Several contributing factors, chronic health problems (OA and neuropathy among some)and medications. Ongoing PT. Again educated about fall precautions. Furosemide decreased from 40 mg to 20 mg daily prn. Hopefully this will help with urinary urgency and therefore number of falls.  Dyspnea, unspecified type Possible etiologies discussed. . Today she is not in respiratory distress Hx of CAD, SOB not associated chest pain,palpitations,or diaphoresis. Further recommendations according to CXR results. Clearly instructed about warning signs.  -     DG Chest 2 View; Future    Return in about 4 weeks (around 04/18/2018) for HTN,falls,COPD.      Betty G. Martinique, MD  [facility]. [place] office.

## 2018-03-21 NOTE — Telephone Encounter (Signed)
Ms Fellman was seen today. Autrey Human Martinique, MD

## 2018-03-21 NOTE — Patient Instructions (Addendum)
A few things to remember from today's visit:   Essential hypertension  Frequent falls  Unstable gait  Dyspnea, unspecified type - Plan: DG Chest 2 View  Chronic obstructive pulmonary disease with acute exacerbation ([place]) - Plan: predniSONE (DELTASONE) 20 MG tablet, albuterol (PROVENTIL HFA;VENTOLIN HFA) 108 (90 Base) MCG/ACT inhaler Furosemide decreased from 40 mg daily to 20 mg as needed. Gabapentin discontinue. Metoprolol increased from 25 mg twice daily to 40 mg twice daily. Albuterol inh 2 puff every 6 hours for a week then as needed for wheezing or shortness of breath.    Please be sure medication list is accurate. If a new problem present, please set up appointment sooner than planned today.

## 2018-03-21 NOTE — Telephone Encounter (Signed)
Message sent to Dr. Jordan for review. 

## 2018-03-22 ENCOUNTER — Other Ambulatory Visit: Payer: Self-pay | Admitting: Family Medicine

## 2018-03-22 ENCOUNTER — Telehealth: Payer: Self-pay | Admitting: *Deleted

## 2018-03-22 DIAGNOSIS — J441 Chronic obstructive pulmonary disease with (acute) exacerbation: Secondary | ICD-10-CM

## 2018-03-22 MED ORDER — BUDESONIDE-FORMOTEROL FUMARATE 160-4.5 MCG/ACT IN AERO: 2.0000 | INHALATION_SPRAY | Freq: Two times a day (BID) | RESPIRATORY_TRACT | 0 refills | Status: DC

## 2018-03-22 NOTE — Telephone Encounter (Signed)
Message sent to Dr. Jordan for review and approval. 

## 2018-03-22 NOTE — Telephone Encounter (Signed)
Because she is having side effects to oral prednisone, recommend discontinue it. Continue albuterol 2 puffs every 6 hours. Symbicort 160-4.5 mcg 2 puff every 12 hours recommended.  Toya Smothers, BJ

## 2018-03-22 NOTE — Telephone Encounter (Signed)
Spoke with [name], gave results and recommendations per Dr. Martinique. Orders faxed to [facility] as requested.

## 2018-03-22 NOTE — Telephone Encounter (Signed)
Copied from [place] [id number]. Topic: General - Other >> Mar 22, 2018  2:27 PM Oneta Rack wrote: Relation to pt: self  Call back number: [phone] (M) Pharmacy:  Reason for call:  Daughter states patient was prescribed predniSONE (DELTASONE) 20 MG tablet and patient is extremely aggravated,irritable and anger and daughter would like PCP to prescribe an alternate. Daughter inquiring about imaging results, please advise

## 2018-03-25 ENCOUNTER — Encounter: Payer: Self-pay | Admitting: Family Medicine

## 2018-03-27 ENCOUNTER — Ambulatory Visit: Payer: Medicare Other | Admitting: Family Medicine

## 2018-04-05 ENCOUNTER — Encounter: Payer: Self-pay | Admitting: Physician Assistant

## 2018-04-05 ENCOUNTER — Ambulatory Visit (INDEPENDENT_AMBULATORY_CARE_PROVIDER_SITE_OTHER): Payer: Medicare Other | Admitting: Physician Assistant

## 2018-04-05 ENCOUNTER — Other Ambulatory Visit: Payer: Self-pay

## 2018-04-05 VITALS — BP 130/78 | HR 77 | Ht 61.0 in | Wt 137.4 lb

## 2018-04-05 DIAGNOSIS — R5383 Other fatigue: Secondary | ICD-10-CM | POA: Diagnosis not present

## 2018-04-05 DIAGNOSIS — R0602 Shortness of breath: Secondary | ICD-10-CM | POA: Diagnosis not present

## 2018-04-05 DIAGNOSIS — E1169 Type 2 diabetes mellitus with other specified complication: Secondary | ICD-10-CM | POA: Diagnosis not present

## 2018-04-05 DIAGNOSIS — I739 Peripheral vascular disease, unspecified: Secondary | ICD-10-CM

## 2018-04-05 DIAGNOSIS — E785 Hyperlipidemia, unspecified: Secondary | ICD-10-CM | POA: Diagnosis not present

## 2018-04-05 DIAGNOSIS — I10 Essential (primary) hypertension: Secondary | ICD-10-CM

## 2018-04-05 DIAGNOSIS — I779 Disorder of arteries and arterioles, unspecified: Secondary | ICD-10-CM

## 2018-04-05 DIAGNOSIS — I2510 Atherosclerotic heart disease of native coronary artery without angina pectoris: Secondary | ICD-10-CM

## 2018-04-05 NOTE — Progress Notes (Signed)
Cardiology Office Note:    Date:  04/05/2018   ID:  Kathryn, Lewis [birth year], MRN [id number]  PCP:  Martinique, Betty G, MD  Cardiologist:  Dorris Carnes, MD   Electrophysiologist:  None   Referring MD: Martinique, Betty G, MD   Chief Complaint  Patient presents with  . Follow-up    CAD     History of Present Illness:    Kathryn Lewis is a 73 y.o. female with coronary artery disease, prior stroke, PAD, carotid artery disease, diabetes, hypertension, hyperlipidemia.   She has a reported hx of myocardial infarction.  She was previously followed by Cardiology in [place], [place].  She was initially seen by Dr. Harrington Challenger in the hospital in 2017 during an admission for left arm pain and hypertensive urgency.  Records from her prior cardiologist from [facility] Randol Kern, MD) were reviewed.  She had a prior CT aortogram that demonstrated mild to moderate disease in the common iliac arteries, complete occlusion of the bilateral external iliac arteries and bilateral SFA disease.  She was last seen by Dr. Harrington Challenger in April 2019.   Kathryn Lewis returns for follow-up.  She is here with her daughter.  She lives at [facility], assisted living facility.  She arrives in a wheelchair.  She has severe peripheral vascular disease and has chronic bilateral lower extremity pain.  She was apparently told at [facility] in [facility] in the past that she was not a candidate for any intervention.  She has not had any chest discomfort.  She has chronic shortness of breath.  She was recently diagnosed with bronchitis and was treated with antibiotics.  Her breathing did get worse with this but is slowly improving.  She sleeps on an incline chronically.  She denies PND or lower extremity swelling.  She denies syncope or bleeding.  She does note significant fatigue.    Prior CV studies:   The following studies were reviewed today:  Echocardiogram 04/18/2015 EF 55-60, normal  wall motion, grade 1 diastolic dysfunction, mild MR  Carotid US (date unknown) Bilateral ICA 50-69  Cardiac catheterization (date unknown) at [place], [place] LM normal LAD proximal 50, mid 80, distal 90; D1 proximal 60, distal 80 LCx mid 40; OM 2 mid 80 RCA proximal 20, mid 70, distal 20 EF 65  Echo 12/11 EF 60-65  Past Medical History:  Diagnosis Date  . Allergy   . Arthritis   . Bipolar 1 disorder ([place])   . Blood transfusion without reported diagnosis   . Carotid artery stenosis    50-69% bilateral ICA stenoses by velocity criteria but no visible plaque and ICA/CCA ratio 1.72 on right and 1.38 on left  . Coronary artery disease   . Depression   . Diabetes mellitus without complication ([place])   . Hypertension   . Left adrenal mass (HCC)    2.5 cm L adrenal mass on CT per note of Dr. Seleta Rhymes 05/20/2014  . MI, old   . PAD (peripheral artery disease) ([place])    03/2014 - CT aortogram with lower extremity runoff (mild to moderate disease in common iliac arteries, complete occulusion of her bilateral external iliac arteries with heavily disease common femoral arteries. She also has disease to her SFAs bilaterally. Popliteal arteries and tibial vessel runoff appears to be adequate)  . Seizures ([place])   . Stroke [facility])    Surgical Hx: The patient  has a past surgical history that includes Neck surgery  and Breast surgery (2012).   Current Medications: Current Meds  Medication Sig  . albuterol (PROVENTIL HFA;VENTOLIN HFA) 108 (90 Base) MCG/ACT inhaler Inhale 2 puffs into the lungs every 6 (six) hours as needed for wheezing or shortness of breath.  Marland Kitchen amLODipine (NORVASC) 10 MG tablet Take 1 tablet (10 mg total) by mouth daily.  Marland Kitchen aspirin EC 81 MG tablet Take 81 mg by mouth daily.  . budesonide-formoterol (SYMBICORT) 160-4.5 MCG/ACT inhaler Inhale 2 puffs into the lungs 2 (two) times daily.  . Cholecalciferol (VITAMIN D3) 50 MCG (2000 UT) capsule Take by mouth.  . clopidogrel (PLAVIX) 75 MG  tablet Take 1 tablet (75 mg total) by mouth daily.  Marland Kitchen ezetimibe (ZETIA) 10 MG tablet Take 1 tablet (10 mg total) by mouth daily.  . ferrous sulfate 325 (65 FE) MG tablet Take 650 mg by mouth daily.   . fluticasone (FLONASE) 50 MCG/ACT nasal spray Place 2 sprays into both nostrils daily.  . furosemide (LASIX) 20 MG tablet Take 1 tablet (20 mg total) by mouth daily as needed for edema.  . isosorbide mononitrate (IMDUR) 30 MG 24 hr tablet Take 30 mg by mouth daily.   Marland Kitchen lamoTRIgine (LAMICTAL) 200 MG tablet Take 1.5 tablets (300 mg total) by mouth See admin instructions. Take 1 1/2 tablets (300 mg) by mouth twice daily - 8am and 2pm  . losartan (COZAAR) 25 MG tablet Take 1 tablet (25 mg total) by mouth daily.  . metoprolol tartrate (LOPRESSOR) 50 MG tablet Take 1 tablet (50 mg total) by mouth 2 (two) times daily.  . Omega-3 Fatty Acids (FISH OIL) 1000 MG CAPS Take 2,000 mg by mouth daily.  . phenytoin (DILANTIN) 100 MG ER capsule T 3 CAPS (300MG ) PO HS ND  . phenytoin (DILANTIN) 30 MG ER capsule Take 1 capsule (30 mg total) by mouth at bedtime.  . rosuvastatin (CRESTOR) 40 MG tablet Take 40 mg by mouth daily.  Marland Kitchen Spacer/Aero Chamber Mouthpiece MISC 1 Device by Does not apply route daily as needed.  Marland Kitchen spironolactone (ALDACTONE) 50 MG tablet Take 1 tablet (50 mg total) by mouth daily.  Marland Kitchen tiZANidine (ZANAFLEX) 4 MG tablet Take 1 tablet (4 mg total) by mouth every 8 (eight) hours as needed for muscle spasms.     Allergies:   Influenza vaccines; Amoxicillin; Augmentin [amoxicillin-pot clavulanate]; Cephalexin; Erythromycin; Niacin and related; Cefadroxil; and Nitrofurantoin macrocrystal   Social History   Tobacco Use  . Smoking status: Former Smoker    Packs/day: 1.00    Types: Cigarettes    Last attempt to quit: 2015    Years since quitting: 5.1  . Smokeless tobacco: Never Used  Substance Use Topics  . Alcohol use: No  . Drug use: No     Family Hx: The patient's family history includes  Alcohol abuse in her father; Arthritis in her daughter, maternal grandmother, mother, and son; Asthma in her daughter; Birth defects in her brother; COPD in her daughter, father, and son; Cancer in her maternal grandmother; Depression in her brother; Diabetes in her mother; Early death in her father and mother; Heart attack in her father and son; Hyperlipidemia in her brother, father, mother, and son; Hypertension in her brother, father, mother, sister, and son; Stroke in her mother.  ROS:   Please see the history of present illness.    Review of Systems  Cardiovascular: Positive for dyspnea on exertion.  Respiratory: Positive for cough.    All other systems reviewed and are negative.  EKGs/Labs/Other Test Reviewed:    EKG:  EKG is  ordered today.  The ekg ordered today demonstrates normal sinus rhythm, heart rate 77, normal axis, nonspecific ST-T wave changes, QTC 423, similar to prior tracings  Recent Labs: 09/09/2017: ALT 33 12/18/2017: BUN 14; Creatinine, Ser 0.86; Hemoglobin 13.1; Platelets 281; Potassium 4.3; Sodium 138   Recent Lipid Panel Lab Results  Component Value Date/Time   CHOL 183 09/09/2017 08:40 AM   TRIG 160 (H) 09/09/2017 08:40 AM   HDL 66 09/09/2017 08:40 AM   CHOLHDL 2.8 09/09/2017 08:40 AM   LDLCALC 85 09/09/2017 08:40 AM    Physical Exam:    VS:  BP 130/78   Pulse 77   Ht 5\' 1"  (1.549 m)   Wt 137 lb 6.4 oz (62.3 kg)   LMP 03/17/2015   SpO2 97%   BMI 25.96 kg/m     Wt Readings from Last 3 Encounters:  04/05/18 137 lb 6.4 oz (62.3 kg)  03/21/18 136 lb 2 oz (61.7 kg)  03/13/18 136 lb (61.7 kg)     Physical Exam  Constitutional: She is oriented to person, place, and time. She appears well-developed and well-nourished. No distress.  HENT:  Head: Normocephalic and atraumatic.  Eyes: No scleral icterus.  Neck: No thyromegaly present.  Cardiovascular: Normal rate and regular rhythm.  No murmur heard. Pulmonary/Chest: Effort normal. She has no  wheezes. She has no rales.  Abdominal: Soft.  Musculoskeletal:        General: No edema.  Lymphadenopathy:    She has no cervical adenopathy.  Neurological: She is alert and oriented to person, place, and time.  Skin: Skin is warm and dry.  Psychiatric: She has a normal mood and affect.    ASSESSMENT & PLAN:    Coronary artery disease involving native coronary artery of native heart with angina pectoris She has a history of myocardial infarction in the past.  Her daughter showed me a picture she took of the cardiac catheterization note that was done at [facility].  She thinks it was done in 2015.  Details are listed above.  The patient has diffuse three-vessel CAD.  She was apparently told she was not a candidate for intervention or bypass.  She has been treated medically.  She does describe significant fatigue.  However, she is not having any anginal symptoms.  Her ECG does not show any significant changes.  She was questioning whether or not she could undergo some sort of test to rule out worsening blockages.  At this point, given her known anatomy, I suspect a stress test would be significantly abnormal.  She is not having significant symptoms to warrant cardiac catheterization.  I will obtain an echocardiogram to reassess LV function.  Continue aspirin, Plavix, rosuvastatin, ezetimibe, amlodipine, isosorbide, metoprolol tartrate.  I will try to obtain records from the [facility] in [place].   Essential hypertension The patient's blood pressure is controlled on her current regimen.  Continue current therapy.   Hyperlipidemia associated with type 2 diabetes mellitus (HCC) Continue high-dose statin and ezetimibe.  PAD (peripheral artery disease) ([place]) She has severe bilateral peripheral arterial disease.  She is not having any worsening symptoms.  She does not have any evidence of critical limb ischemia.  I am not sure that referring her to vascular surgery would be beneficial.   As noted I will try to obtain records.  Bilateral carotid artery disease, unspecified type ([place]) I am not sure when her last carotid ultrasound was  performed.  Arrange follow-up carotid ultrasound.  Shortness of breath She has chronic shortness of breath.  As noted, she also notes fatigue.  She does not appear to be volume overloaded on exam.  I will obtain an echocardiogram.  I will also obtain BMET, CBC and a BNP.  Other fatigue Etiology not entirely clear.  Obtain echocardiogram as noted.  I will also obtain BMET, CBC.  If her echocardiogram is normal and her labs are normal, she should follow-up with primary care for further evaluation.  Dispo:  Return in about 4 weeks (around 05/03/2018) for Follow up after testing, w/ Dr. Harrington Challenger, or Richardson Dopp, PA-C.   Medication Adjustments/Labs and Tests Ordered: Current medicines are reviewed at length with the patient today.  Concerns regarding medicines are outlined above.  Tests Ordered: No orders of the defined types were placed in this encounter.  Medication Changes: No orders of the defined types were placed in this encounter.   Signed, Richardson Dopp, PA-C  04/05/2018 3:37 PM    [facility] [place], [place], [place]  [address] Phone: [phone]; Fax: [phone]

## 2018-04-05 NOTE — Patient Instructions (Addendum)
Medication Instructions:   Your physician recommends that you continue on your current medications as directed. Please refer to the Current Medication list given to you today.   If you need a refill on your cardiac medications before your next appointment, please call your pharmacy.   Lab work:  BNP  BMET AND CBC TODAY   If you have labs (blood work) drawn today and your tests are completely normal, you will receive your results only by: Marland Kitchen MyChart Message (if you have MyChart) OR . A paper copy in the mail If you have any lab test that is abnormal or we need to change your treatment, we will call you to review the results.  Testing/Procedures: Your physician has requested that you have an echocardiogram. Echocardiography is a painless test that uses sound waves to create images of your heart. It provides your doctor with information about the size and shape of your heart and how well your heart's chambers and valves are working. This procedure takes approximately one hour. There are no restrictions for this procedure.  Your physician has requested that you have a carotid duplex. This test is an ultrasound of the carotid arteries in your neck. It looks at blood flow through these arteries that supply the brain with blood. Allow one hour for this exam. There are no restrictions or special instructions.   Follow-Up: At [facility], you and your health needs are our priority.  As part of our continuing mission to provide you with exceptional heart care, we have created designated Provider Care Teams.  These Care Teams include your primary Cardiologist (physician) and Advanced Practice Providers (APPs -  Physician Assistants and Nurse Practitioners) who all work together to provide you with the care you need, when you need it. You will need a follow up appointment in:  4.- 6 weeks   Please call our office 2 months in advance to schedule this appointment.  You may see Dorris Carnes, MD or one of the  following Advanced Practice Providers on your designated Care Team: Richardson Dopp, PA-C  Any Other Special Instructions Will Be Listed Below (If Applicable).

## 2018-04-06 LAB — BASIC METABOLIC PANEL
BUN / CREAT RATIO: 18 (ref 12–28)
BUN: 18 mg/dL (ref 8–27)
CO2: 21 mmol/L (ref 20–29)
CREATININE: 1.01 mg/dL — AB (ref 0.57–1.00)
Calcium: 9.8 mg/dL (ref 8.7–10.3)
Chloride: 96 mmol/L (ref 96–106)
GFR calc Af Amer: 64 mL/min/{1.73_m2} (ref >59)
GFR, EST NON AFRICAN AMERICAN: 56 mL/min/{1.73_m2} — AB (ref >59)
GLUCOSE: 198 mg/dL — AB (ref 65–99)
Potassium: 4.4 mmol/L (ref 3.5–5.2)
SODIUM: 134 mmol/L (ref 134–144)

## 2018-04-06 LAB — CBC
HEMATOCRIT: 36.2 % (ref 34.0–46.6)
Hemoglobin: 12.4 g/dL (ref 11.1–15.9)
MCH: 29 pg (ref 26.6–33.0)
MCHC: 34.3 g/dL (ref 31.5–35.7)
MCV: 85 fL (ref 79–97)
Platelets: 291 10*3/uL (ref 150–450)
RBC: 4.27 x10E6/uL (ref 3.77–5.28)
RDW: 12.7 % (ref 11.7–15.4)
WBC: 10.2 10*3/uL (ref 3.4–10.8)

## 2018-04-06 LAB — PRO B NATRIURETIC PEPTIDE: NT-Pro BNP: 75 pg/mL (ref 0–301)

## 2018-04-07 ENCOUNTER — Telehealth: Payer: Self-pay | Admitting: *Deleted

## 2018-04-07 DIAGNOSIS — R5383 Other fatigue: Secondary | ICD-10-CM

## 2018-04-07 NOTE — Telephone Encounter (Signed)
SPOKE WITH PT AND PT AWARE OF RESULTS AND VERBALIZED UNDERSTANDING  RETURN 4-13 FOR LAB WORK BEFORE 2:30P APPT WITH VIN

## 2018-04-10 ENCOUNTER — Other Ambulatory Visit (HOSPITAL_COMMUNITY): Payer: Medicare Other

## 2018-04-11 ENCOUNTER — Ambulatory Visit (INDEPENDENT_AMBULATORY_CARE_PROVIDER_SITE_OTHER): Payer: Medicare Other | Admitting: Family Medicine

## 2018-04-11 ENCOUNTER — Encounter: Payer: Self-pay | Admitting: Family Medicine

## 2018-04-11 ENCOUNTER — Other Ambulatory Visit: Payer: Self-pay

## 2018-04-11 VITALS — BP 130/76 | HR 72 | Temp 97.8°F | Resp 16 | Ht 61.0 in

## 2018-04-11 DIAGNOSIS — R059 Cough, unspecified: Secondary | ICD-10-CM

## 2018-04-11 DIAGNOSIS — I6523 Occlusion and stenosis of bilateral carotid arteries: Secondary | ICD-10-CM | POA: Diagnosis not present

## 2018-04-11 DIAGNOSIS — I10 Essential (primary) hypertension: Secondary | ICD-10-CM | POA: Diagnosis not present

## 2018-04-11 DIAGNOSIS — J441 Chronic obstructive pulmonary disease with (acute) exacerbation: Secondary | ICD-10-CM | POA: Diagnosis not present

## 2018-04-11 DIAGNOSIS — R05 Cough: Secondary | ICD-10-CM | POA: Diagnosis not present

## 2018-04-11 MED ORDER — TIOTROPIUM BROMIDE MONOHYDRATE 18 MCG IN CAPS: 18.0000 ug | ORAL_CAPSULE | Freq: Every day | RESPIRATORY_TRACT | 1 refills | Status: DC

## 2018-04-11 NOTE — Assessment & Plan Note (Signed)
BP better controlled. No changes in current management. Continue low-salt diet. Continue monitoring BP regularly.

## 2018-04-11 NOTE — Progress Notes (Signed)
ACUTE VISIT  HPI:  Chief Complaint  Patient presents with  . Cough    ongoing URI issues x 1 month, non-productive cough  . Fatigue    Kathryn Lewis is a 73 y.o.female here today complaining of a month of respiratory symptoms. Intermittent nonproductive cough and some wheezing (improved). She denies dyspnea. Negative for fever,chills,MS changes,or decreased appetite. + Fatigue, she has Hx of fatigue and fibromyalgia. She is not sure about aggravating or alleviating factors.   Cough  This is a recurrent problem. The current episode started 1 to 4 weeks ago. The problem has been waxing and waning. The cough is non-productive. Associated symptoms include nasal congestion, postnasal drip, rhinorrhea and wheezing. Pertinent negatives include no chest pain, chills, fever, headaches, heartburn, hemoptysis, myalgias, rash, sore throat or shortness of breath. The symptoms are aggravated by lying down. She has tried a beta-agonist inhaler for the symptoms. The treatment provided mild relief. Her past medical history is significant for bronchitis, COPD and environmental allergies.    On 03/21/2018 CXR show mild bronchitic changes.  No Hx of recent travel. No sick contact. No known insect bite.   OTC medications for this problem: None.   COPD, currently she is on Symbicort 160-4.5 mcg 2 puff twice daily and albuterol inhaler 2 puff every 4-6 hours as needed. Tolerating medications well.  HTN: She was last seen on 03/21/2018, when BP was not well controlled. Metoprolol tartrate was increased from 25 mg to 50 mg twice daily. According to the daughter, BP are running much better.  She is also on losartan 25 mg daily, spironolactone 50 mg daily, Imdur 30 mg daily, and amlodipine 10 mg daily. She is tolerating medication well, she is not reporting side effects. Negative for visual changes, headache, chest pain, decreased urine output, or edema.    Review of Systems   Constitutional: Positive for fatigue. Negative for appetite change, chills and fever.  HENT: Positive for congestion, postnasal drip and rhinorrhea. Negative for mouth sores, sinus pressure, sore throat, trouble swallowing and voice change.   Respiratory: Positive for cough and wheezing. Negative for hemoptysis and shortness of breath.   Cardiovascular: Negative for chest pain.  Gastrointestinal: Negative for abdominal pain, diarrhea, heartburn, nausea and vomiting.  Musculoskeletal: Positive for gait problem. Negative for myalgias.  Skin: Negative for rash.  Allergic/Immunologic: Positive for environmental allergies.  Neurological: Negative for weakness and headaches.  Psychiatric/Behavioral: Negative for confusion. The patient is nervous/anxious.     Current Outpatient Medications on File Prior to Visit  Medication Sig Dispense Refill  . albuterol (PROVENTIL HFA;VENTOLIN HFA) 108 (90 Base) MCG/ACT inhaler Inhale 2 puffs into the lungs every 6 (six) hours as needed for wheezing or shortness of breath. 1 Inhaler 6  . amLODipine (NORVASC) 10 MG tablet Take 1 tablet (10 mg total) by mouth daily. 90 tablet 2  . aspirin EC 81 MG tablet Take 81 mg by mouth daily.    . budesonide-formoterol (SYMBICORT) 160-4.5 MCG/ACT inhaler Inhale 2 puffs into the lungs 2 (two) times daily. 1 Inhaler 0  . Cholecalciferol (VITAMIN D3) 50 MCG (2000 UT) capsule Take by mouth.    . clopidogrel (PLAVIX) 75 MG tablet Take 1 tablet (75 mg total) by mouth daily. 90 tablet 2  . ezetimibe (ZETIA) 10 MG tablet Take 1 tablet (10 mg total) by mouth daily. 90 tablet 3  . ferrous sulfate 325 (65 FE) MG tablet Take 650 mg by mouth daily.     Marland Kitchen  fluticasone (FLONASE) 50 MCG/ACT nasal spray Place 2 sprays into both nostrils daily. 16 g 6  . furosemide (LASIX) 20 MG tablet Take 1 tablet (20 mg total) by mouth daily as needed for edema. 30 tablet 3  . isosorbide mononitrate (IMDUR) 30 MG 24 hr tablet Take 30 mg by mouth daily.      Marland Kitchen lamoTRIgine (LAMICTAL) 200 MG tablet Take 1.5 tablets (300 mg total) by mouth See admin instructions. Take 1 1/2 tablets (300 mg) by mouth twice daily - 8am and 2pm 270 tablet 3  . losartan (COZAAR) 25 MG tablet Take 1 tablet (25 mg total) by mouth daily. 90 tablet 1  . metoprolol tartrate (LOPRESSOR) 50 MG tablet Take 1 tablet (50 mg total) by mouth 2 (two) times daily. 60 tablet 2  . Omega-3 Fatty Acids (FISH OIL) 1000 MG CAPS Take 2,000 mg by mouth daily.    . phenytoin (DILANTIN) 100 MG ER capsule T 3 CAPS (300MG ) PO HS ND 270 capsule 3  . phenytoin (DILANTIN) 30 MG ER capsule Take 1 capsule (30 mg total) by mouth at bedtime. 30 capsule 0  . rosuvastatin (CRESTOR) 40 MG tablet Take 40 mg by mouth daily.    Marland Kitchen Spacer/Aero Chamber Mouthpiece MISC 1 Device by Does not apply route daily as needed. 1 each 0  . spironolactone (ALDACTONE) 50 MG tablet Take 1 tablet (50 mg total) by mouth daily. 30 tablet 11  . tiZANidine (ZANAFLEX) 4 MG tablet Take 1 tablet (4 mg total) by mouth every 8 (eight) hours as needed for muscle spasms. 60 tablet 0   No current facility-administered medications on file prior to visit.      Past Medical History:  Diagnosis Date  . Allergy   . Arthritis   . Bipolar 1 disorder ([place])   . Blood transfusion without reported diagnosis   . Carotid artery stenosis    50-69% bilateral ICA stenoses by velocity criteria but no visible plaque and ICA/CCA ratio 1.72 on right and 1.38 on left  . Coronary artery disease   . Depression   . Diabetes mellitus without complication ([place])   . Hypertension   . Left adrenal mass (HCC)    2.5 cm L adrenal mass on CT per note of Dr. Seleta Rhymes 05/20/2014  . MI, old   . PAD (peripheral artery disease) ([place])    03/2014 - CT aortogram with lower extremity runoff (mild to moderate disease in common iliac arteries, complete occulusion of her bilateral external iliac arteries with heavily disease common femoral arteries. She also has disease to her  SFAs bilaterally. Popliteal arteries and tibial vessel runoff appears to be adequate)  . Seizures ([place])   . Stroke [facility])    Allergies  Allergen Reactions  . Influenza Vaccines Anaphylaxis  . Amoxicillin Other (See Comments)    colitis  . Augmentin [Amoxicillin-Pot Clavulanate] Other (See Comments)    colitis  . Cephalexin Other (See Comments)    colitis  . Erythromycin Other (See Comments)    colitis  . Niacin And Related Other (See Comments)    colitis  . Cefadroxil Other (See Comments)    Reported by [facility] 2013 - unknown reaction  . Nitrofurantoin Macrocrystal Other (See Comments)    Reported by [facility] 2013 - unknown reaction    Social History   Socioeconomic History  . Marital status: Single    Spouse name: Not on file  . Number of children: Not on file  . Years of education:  Not on file  . Highest education level: Not on file  Occupational History  . Not on file  Social Needs  . Financial resource strain: Not on file  . Food insecurity:    Worry: Not on file    Inability: Not on file  . Transportation needs:    Medical: Not on file    Non-medical: Not on file  Tobacco Use  . Smoking status: Former Smoker    Packs/day: 1.00    Types: Cigarettes    Last attempt to quit: 2015    Years since quitting: 5.2  . Smokeless tobacco: Never Used  Substance and Sexual Activity  . Alcohol use: No  . Drug use: No  . Sexual activity: Not on file  Lifestyle  . Physical activity:    Days per week: Not on file    Minutes per session: Not on file  . Stress: Not on file  Relationships  . Social connections:    Talks on phone: Not on file    Gets together: Not on file    Attends religious service: Not on file    Active member of club or organization: Not on file    Attends meetings of clubs or organizations: Not on file    Relationship status: Not on file  Other Topics Concern  . Not on file  Social History Narrative   Pt lives in assisted living  facility   Has 2 adult children   College   Worked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    Vitals:   04/11/18 1401  BP: 130/76  Pulse: 72  Resp: 16  Temp: 97.8 F (36.6 C)  SpO2: 97%   Body mass index is 25.96 kg/m.  Physical Exam  Nursing note reviewed. Constitutional: She appears well-developed. She does not appear ill. No distress.  HENT:  Head: Normocephalic and atraumatic.  Right Ear: Tympanic membrane, external ear and ear canal normal.  Left Ear: Tympanic membrane, external ear and ear canal normal.  Nose: Rhinorrhea present. Right sinus exhibits no maxillary sinus tenderness and no frontal sinus tenderness. Left sinus exhibits no maxillary sinus tenderness and no frontal sinus tenderness.  Mouth/Throat: Oropharynx is clear and moist and mucous membranes are normal.  Postnasal drainage.   Eyes: Conjunctivae are normal.  Cardiovascular: Normal rate and regular rhythm.  No murmur heard. Respiratory: Effort normal and breath sounds normal. No respiratory distress.  Lymphadenopathy:    She has no cervical adenopathy.  Neurological: She is alert.  No focal deficit appreciated. She is in her wheelchair.  Skin: Skin is warm. No rash noted. No erythema.  Psychiatric: She has a normal mood and affect.  Well groomed, good eye contact.    ASSESSMENT AND PLAN:   Kathryn Lewis was seen today for cough and fatigue.  Diagnoses and all orders for this visit:  Cough Possible etiology discussed, including allergies, GERD, COPD. Lung auscultation normal, so I do not think lung imaging is needed at this time. Explained that cough can last a few more days and even weeks after acute URI,worse if also Hx of allergies or lung disease.  Essential hypertension BP better controlled. No changes in current management. Continue low-salt diet. Continue monitoring BP regularly.  COPD (chronic obstructive pulmonary disease) (HCC) Persistent cough could be related with poorly controlled COPD.  Spiriva 1 capsule daily added to her medications. No changes in Symbicort or albuterol. Follow-up in 4 to 6 weeks.  -     tiotropium (SPIRIVA HANDIHALER) 18 MCG  inhalation capsule; Place 1 capsule (18 mcg total) into inhaler and inhale daily.   Return in about 6 weeks (around 05/23/2018) for COPD.    Laresha Bacorn G. Martinique, MD  [facility]. [place] office.

## 2018-04-11 NOTE — Assessment & Plan Note (Signed)
Persistent cough could be related with poorly controlled COPD. Spiriva 1 capsule daily added to her medications. No changes in Symbicort or albuterol. Follow-up in 4 to 6 weeks.

## 2018-04-13 ENCOUNTER — Other Ambulatory Visit: Payer: Self-pay | Admitting: Physician Assistant

## 2018-04-13 DIAGNOSIS — I6523 Occlusion and stenosis of bilateral carotid arteries: Secondary | ICD-10-CM

## 2018-04-19 ENCOUNTER — Telehealth: Payer: Self-pay | Admitting: Family Medicine

## 2018-04-19 NOTE — Telephone Encounter (Signed)
Message sent to Dr. Jordan for review and approval. 

## 2018-04-19 NOTE — Telephone Encounter (Signed)
Copied from [place] [phone]. Topic: Quick Communication - Rx Refill/Question >> Apr 19, 2018  3:48 PM Gustavus Messing wrote: Medication: Patient's daughter stated that she is having Cabin Fever symptoms because she is locked into a facility. She stated that her mother is becoming more   angry and she would like to see about getting a medication to lightly sedate her     Agent: Please be advised that RX refills may take up to 3 business days. We ask that you follow-up with your pharmacy.

## 2018-04-24 ENCOUNTER — Other Ambulatory Visit: Payer: Self-pay | Admitting: Family Medicine

## 2018-04-24 ENCOUNTER — Encounter (HOSPITAL_COMMUNITY): Payer: Medicare Other

## 2018-04-24 MED ORDER — HALOPERIDOL 0.5 MG PO TABS: 0.5000 mg | ORAL_TABLET | Freq: Two times a day (BID) | ORAL | 0 refills | Status: DC | PRN

## 2018-04-24 NOTE — Telephone Encounter (Signed)
We can reevaluate if she started having symptoms again. Ativan or Klonopin can increase risk of confusion and MS changes. Thanks, BJ

## 2018-04-24 NOTE — Telephone Encounter (Signed)
Daughter informed and verbalized understanding

## 2018-04-24 NOTE — Telephone Encounter (Signed)
Haloperidol 0.5 mg bid prn to start. Med can increase risk for falls.  Web Ex follow up with daughter in 2-3 weeks.  Thanks, BJ

## 2018-04-24 NOTE — Telephone Encounter (Signed)
Spoke with patient's daughter and she stated that patient is doing much better since being able to see her from the window. Patient's daughter doesn't think that she need Haldol, may be too strong could she possibly get low dose Ativan or Klonopin as a prn instead for them to give her if she starts to act that way again.

## 2018-04-28 ENCOUNTER — Telehealth: Payer: Self-pay | Admitting: Physician Assistant

## 2018-04-28 NOTE — Telephone Encounter (Signed)
   TELEPHONE CALL NOTE  This patient has been deemed a candidate for follow-up tele-health visit to limit community exposure during the Covid-19 pandemic. I spoke with the patient via phone to discuss instructions. This has been outlined on the patient's AVS (dotphrase: [email or web address]). The patient was advised to review the section on consent for treatment as well. The patient will receive a phone call 2-3 days prior to their E-Visit at which time consent will be verbally confirmed. A Virtual Office Visit appointment type has been scheduled for follow up with Robbie Lis, with "VIDEO" or "TELEPHONE" in the appointment notes - patient prefers Video type.  I have either confirmed the patient is active in MyChart or offered to send sign-up link to phone/email via Mychart icon beside patient's photo.  [place], [place] 04/28/2018 12:04 PM

## 2018-05-02 ENCOUNTER — Telehealth: Payer: Self-pay | Admitting: Physician Assistant

## 2018-05-02 ENCOUNTER — Telehealth: Payer: Self-pay | Admitting: Family Medicine

## 2018-05-02 NOTE — Telephone Encounter (Signed)
Copied from [place] [phone]. Topic: General - Other >> May 02, 2018  9:52 AM Lennox Solders wrote: Reason for CRM: pt is calling to let dr Martinique know going forward all her medications needs to be generic and on tier 1 plan. Pt lives at [facility] and they refill her med thorough [facility]. Pt does not needs any medications now

## 2018-05-02 NOTE — Telephone Encounter (Signed)
Spoke with patient's daughter, [name].  She said that Ms. Gannett is in an assisted living facility which is quarantined.  Patient is also under Hospice Pallitive care per [facility].  Appointment was canceled, per Vin, while under Hospice care.

## 2018-05-02 NOTE — Telephone Encounter (Signed)
Message sent to Dr. Jordan for review. 

## 2018-05-08 ENCOUNTER — Other Ambulatory Visit: Payer: Medicare Other

## 2018-05-08 ENCOUNTER — Telehealth: Payer: Medicare Other | Admitting: Physician Assistant

## 2018-06-08 ENCOUNTER — Telehealth: Payer: Self-pay | Admitting: *Deleted

## 2018-06-08 NOTE — Telephone Encounter (Signed)
Copied from [place]. Topic: General - Inquiry >> Jun 08, 2018  9:25 AM Mathis Bud wrote: Reason for CRM: Patients daugher Luiz Iron) called regarding medication (budesonide-formoterol (SYMBICORT) 160-4.5) that PCP prescibed 2/26 stating that medication was too expensive and would like to discuss other options that would give her the same results.  Patients daughter stated her mother is in a assisted living home and the medication goes straight there and family will get stuck with bill.  Patient daughter call back [phone]

## 2018-06-08 NOTE — Telephone Encounter (Signed)
Message sent to Dr. Jordan for review and approval. 

## 2018-06-12 NOTE — Telephone Encounter (Signed)
Some options: Dulera,Advair,Breo. Please ask daughter to find out which one is covered. Thanks, BJ

## 2018-06-12 NOTE — Telephone Encounter (Signed)
Spoke with [name] and gave recommendations per Dr. Martinique. [name] verbalized understanding and stated that she would contact insurance agency.

## 2018-06-13 DIAGNOSIS — Z139 Encounter for screening, unspecified: Secondary | ICD-10-CM | POA: Diagnosis not present

## 2018-06-20 ENCOUNTER — Telehealth: Payer: Self-pay | Admitting: Family Medicine

## 2018-06-20 NOTE — Telephone Encounter (Signed)
Copied from [place] [phone]. Topic: Quick Communication - Rx Refill/Question >> Jun 20, 2018 11:41 AM Oneta Rack wrote: [facility]  [phone]   Medication: 90 day supply on both tiotropium (SPIRIVA HANDIHALER) 18 MCG inhalation capsule , metoprolol tartrate (LOPRESSOR) 50 MG tablet   Preferred Pharmacy (with phone number or street name): [facility] fax [phone]  Agent: Please be advised that RX refills may take up to 3 business days. We ask that you follow-up with your pharmacy.

## 2018-06-21 ENCOUNTER — Other Ambulatory Visit: Payer: Self-pay | Admitting: *Deleted

## 2018-06-21 DIAGNOSIS — J441 Chronic obstructive pulmonary disease with (acute) exacerbation: Secondary | ICD-10-CM

## 2018-06-21 DIAGNOSIS — I10 Essential (primary) hypertension: Secondary | ICD-10-CM

## 2018-06-21 MED ORDER — METOPROLOL TARTRATE 50 MG PO TABS: 50.0000 mg | ORAL_TABLET | Freq: Two times a day (BID) | ORAL | 2 refills | Status: DC

## 2018-06-21 MED ORDER — TIOTROPIUM BROMIDE MONOHYDRATE 18 MCG IN CAPS: 18.0000 ug | ORAL_CAPSULE | Freq: Every day | RESPIRATORY_TRACT | 2 refills | Status: DC

## 2018-06-21 NOTE — Telephone Encounter (Signed)
Rx's sent to the pharmacy. 

## 2018-06-28 ENCOUNTER — Telehealth (HOSPITAL_COMMUNITY): Payer: Self-pay | Admitting: *Deleted

## 2018-06-28 NOTE — Telephone Encounter (Signed)
Talked to daughter about Kathryn Lewis test to be done. Patient coming from assisted living . They were quarantine for safety of patients and not an outbreak.

## 2018-06-29 ENCOUNTER — Other Ambulatory Visit: Payer: Self-pay

## 2018-06-29 ENCOUNTER — Ambulatory Visit (HOSPITAL_COMMUNITY): Payer: Medicare Other | Attending: Cardiology

## 2018-06-29 DIAGNOSIS — R0602 Shortness of breath: Secondary | ICD-10-CM | POA: Diagnosis not present

## 2018-06-30 ENCOUNTER — Encounter: Payer: Self-pay | Admitting: Physician Assistant

## 2018-07-07 IMAGING — CT CT CERVICAL SPINE W/O CM
4 of 8 series · 12 of 33 positions shown, 13 images · non-contrast
Comparison: CT head 06/07/2014

CLINICAL DATA: Fell today striking back of head, history
hypertension, stroke, coronary artery disease post MI

EXAM:
CT HEAD WITHOUT CONTRAST
CT CERVICAL SPINE WITHOUT CONTRAST
TECHNIQUE: Multidetector CT imaging of the head and cervical spine was
performed following the standard protocol without intravenous
contrast. Multiplanar CT image reconstructions of the cervical spine
were also generated.

[Series 204: sagittal st, idose (1) · sagittal · 0.40mm/px · 5 of 68 slices shown]
[im 12/68  bone]
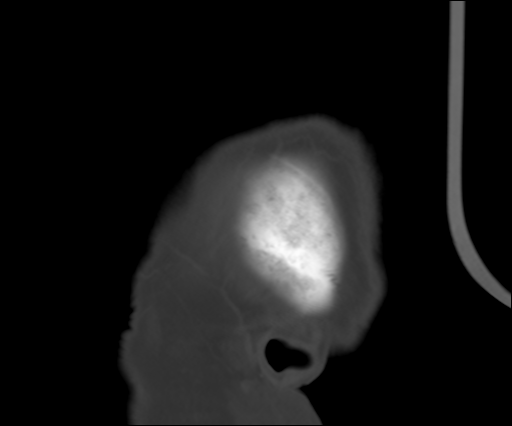
[im 23/68  bone]
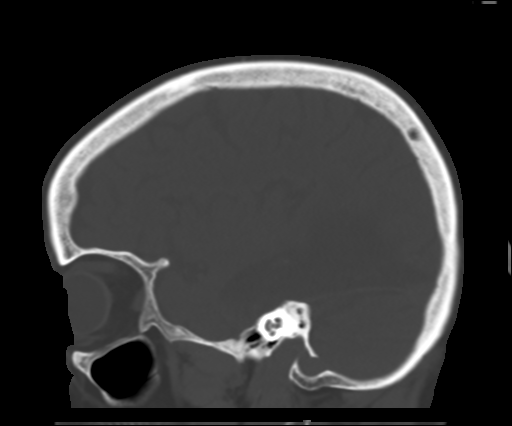
[im 34/68  bone]
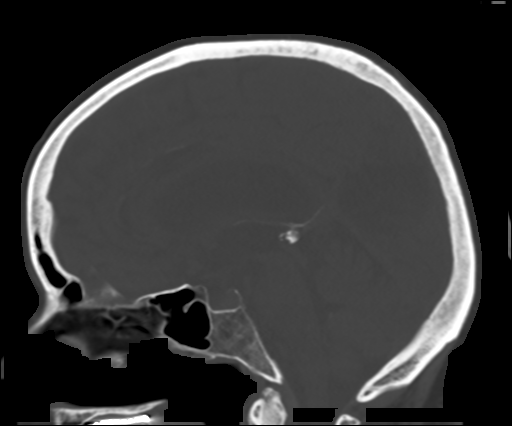
[im 45/68  bone]
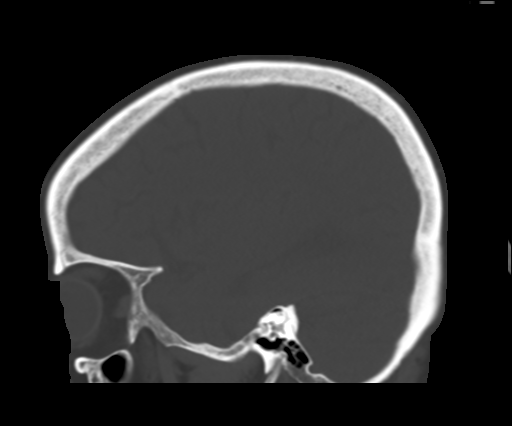
[im 56/68  bone]
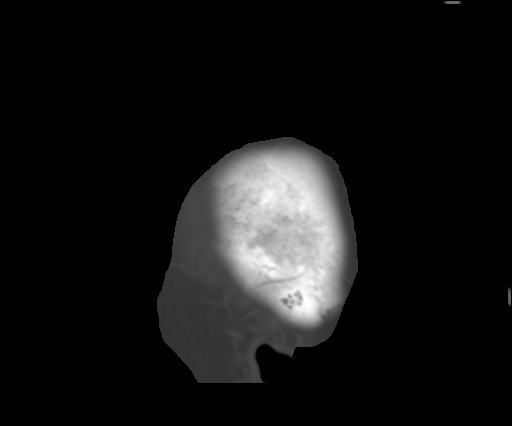

[Series 302: soft tissue, idose (2) · axial · 0.26mm/px · z∈[+928,+1024]mm · 3 of 97 slices shown]
[im 25/97  soft-tissue]
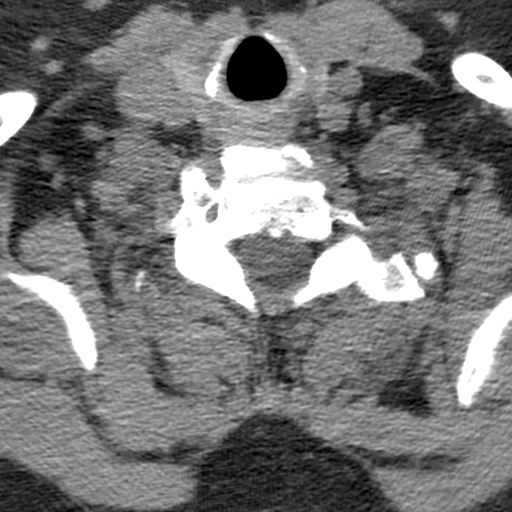
[im 49/97  soft-tissue]
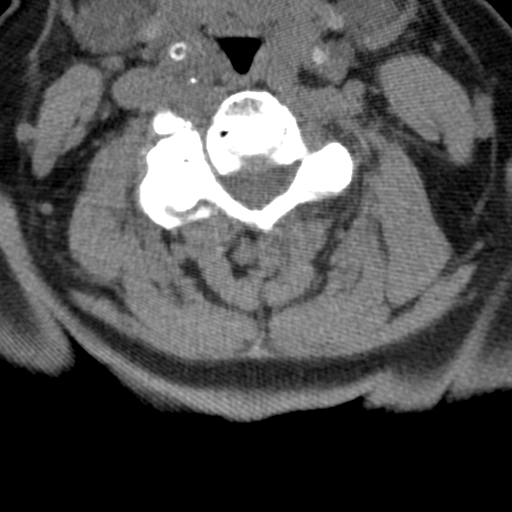
[im 73/97  soft-tissue]
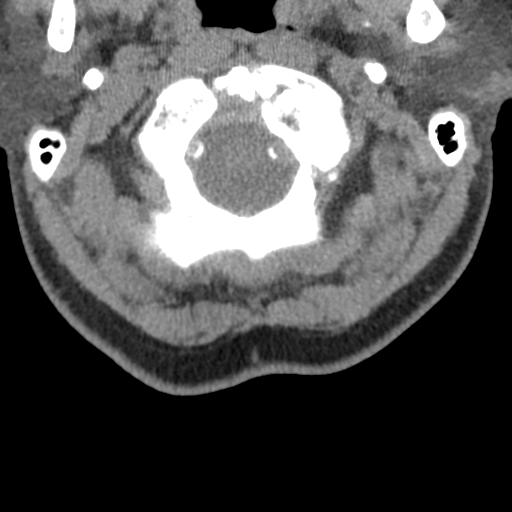

[Series 304: coronal, idose (2) · coronal · 0.34mm/px · 1 of 65 slices shown]
[im 33/65  bone]
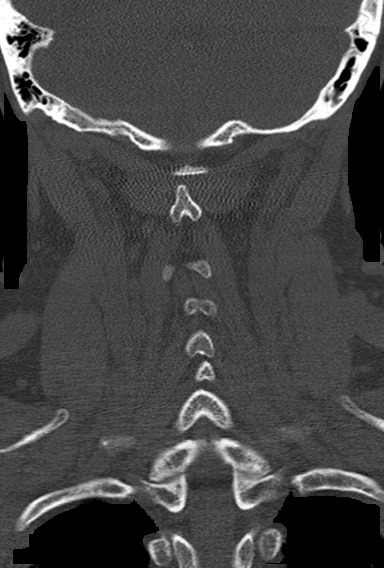

[Series 306: orthogonals, idose (2) · axial · 0.34mm/px · z∈[+914,+1007]mm · 3 of 97 slices shown, 4 images]
[im 25/97  soft-tissue]
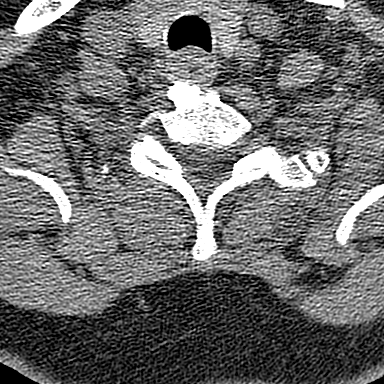
[im 25/97  bone]
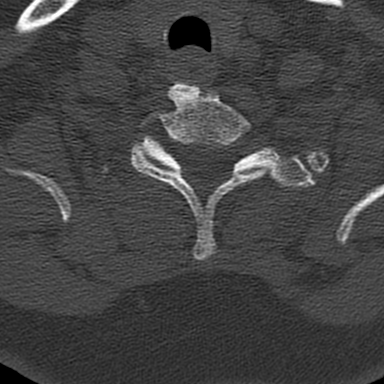
[im 49/97  bone]
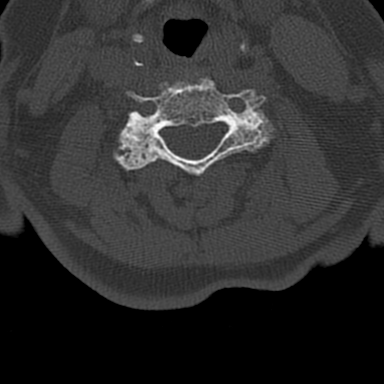
[im 73/97  bone]
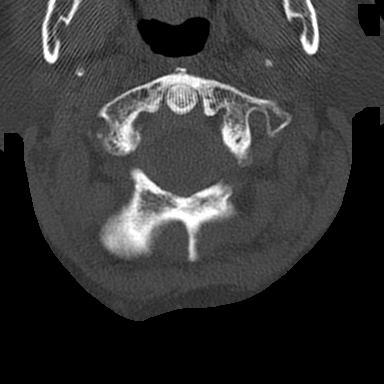

[12 of 33 positions shown; findings below may reference images not displayed]

FINDINGS: CT HEAD FINDINGS

Brain: Generalized atrophy. Stable ventricular morphology with ex
vacuo dilatation of the atrium and occipital horn of the RIGHT
lateral ventricle. Large old RIGHT occipital and smaller LEFT
temporal infarcts. Small vessel chronic ischemic changes of deep
cerebral white matter. No midline shift or mass effect. No definite
intracranial hemorrhage, mass lesion, or evidence acute infarction.
No extra-axial fluid collections.

Vascular: Atherosclerotic calcification of internal carotid and
vertebral arteries at skullbase

Skull: Lucent focus at the posterior RIGHT parietal bone unchanged
question related to arachnoid granulation.

Sinuses/Orbits: Intact

Other: N/A

CT CERVICAL SPINE FINDINGS

Alignment: Normal

Skull base and vertebrae: Visualized skullbase intact. Prior
anterior fusion C5-C6. Vertebral body heights maintained. Multilevel
facet degenerative changes. No fracture or bone destruction.
Significant multilevel neural foraminal narrowing bilaterally by
combinations of facet and uncovertebral hypertrophy, especially
RIGHT C3-C4, C4-C5 and C5-C6, LEFT C4-C5.

Soft tissues and spinal canal: Prevertebral soft tissues normal
thickness. Atherosclerotic calcifications at the carotid arteries,
which extend retropharyngeal bilaterally. Additional atherosclerotic
calcifications at the aortic arch and proximal great vessels.

Disc levels: Multilevel disc space narrowing and minimal endplate
spur formation.

Upper chest: Lung apices clear

Other: N/A
IMPRESSION: Generalized atrophy with small vessel chronic ischemic changes of
deep cerebral white matter.

Old RIGHT occipital and smaller RIGHT temporal lobe infarcts.

No acute intracranial abnormalities.

Multilevel degenerative disc and facet disease changes of the
cervical spine with multilevel neural foraminal stenoses as above.

Prior anterior fusion C5-C6.

No acute cervical spine abnormalities.

Extensive atherosclerotic calcifications in the neck, upper chest,
and at skullbase.

## 2018-08-16 ENCOUNTER — Other Ambulatory Visit: Payer: Self-pay

## 2018-08-16 ENCOUNTER — Non-Acute Institutional Stay: Payer: Medicare Other | Admitting: Adult Health Nurse Practitioner

## 2018-08-16 DIAGNOSIS — Z515 Encounter for palliative care: Secondary | ICD-10-CM

## 2018-08-16 NOTE — Progress Notes (Signed)
[facility] Consult Note Telephone: [phone]  Fax: [phone]  PATIENT NAME: Kathryn Lewis DOB: February 20, 1945 MRN: [id number]  PRIMARY CARE PROVIDER:   Martinique, Betty G, MD  REFERRING PROVIDER:  Martinique, Betty G, MD [address] [place],  [place] [address]  RESPONSIBLE PARTY:     Anette Barra (daughter) [phone]    RECOMMENDATIONS and PLAN:  1.  Recurrent falls. Patient denies falls but staff does state that she has had a couple of recent falls without injury. Patient often forgets to ask for assistance with transfers. Continued staff assistance with transfers and ADLs .   2.  Cough.  She was recently started on spiriva for chronic cough related to COPD.  Denies any cough today.  Continue current COPD meds.  3.  Goals of care. DNR in place.  No hospitalizations or infections since last visit.  No change in appetite or reported weight loss.  Continue supportive care.  I spent 20 minutes providing this consultation,  from 11:00 to 11;20. More than 50% of the time in this consultation was spent coordinating communication.   HISTORY OF PRESENT ILLNESS:  Kathryn Lewis is a 73 y.o. year old female with multiple medical problems including HTN, HLD, CAD, s/pCVA, gait disorder, STML, COPD, OSA. Palliative Care was asked to help address goals of care.   CODE STATUS: DNR  PPS: 40% HOSPICE ELIGIBILITY/DIAGNOSIS: TBD  PHYSICAL EXAM:   General: patient lying in bed in NAD Cardiovascular: regular rate and rhythm Pulmonary: lung sounds clear; normal respiratory effort Abdomen: soft, nontender, + bowel sounds GU: no suprapubic tenderness Extremities: no edema, no joint deformities Skin: no rashes Neurological: Weakness but otherwise nonfocal;  Patient is forgetful    PAST MEDICAL HISTORY:  Past Medical History:  Diagnosis Date  . Allergy   . Arthritis   . Bipolar 1 disorder ([place])   . Blood transfusion without reported  diagnosis   . Carotid artery stenosis    50-69% bilateral ICA stenoses by velocity criteria but no visible plaque and ICA/CCA ratio 1.72 on right and 1.38 on left  . Coronary artery disease   . Depression   . Diabetes mellitus without complication ([place])   . Echocardiogram    Echo 06/2018: Hyperdynamic systolic function, EF >64, mild concentric LVH, elevated LVEDP (E/E' suggests impaired relaxation), mild AI, lipomatous interatrial septum  . Hypertension   . Left adrenal mass (H[facility])    2.5 cm L adrenal mass on CT per note of Dr. Seleta Rhymes 05/20/2014  . MI, old   . PAD (peripheral artery disease) ([place])    03/2014 - CT aortogram with lower extremity runoff (mild to moderate disease in common iliac arteries, complete occulusion of her bilateral external iliac arteries with heavily disease common femoral arteries. She also has disease to her SFAs bilaterally. Popliteal arteries and tibial vessel runoff appears to be adequate)  . Seizures ([place])   . Stroke [facility])     SOCIAL HX:  Social History   Tobacco Use  . Smoking status: Former Smoker    Packs/day: 1.00    Types: Cigarettes    Quit date: 2015    Years since quitting: 5.5  . Smokeless tobacco: Never Used  Substance Use Topics  . Alcohol use: No    ALLERGIES:  Allergies  Allergen Reactions  . Influenza Vaccines Anaphylaxis  . Amoxicillin Other (See Comments)    colitis  . Augmentin [Amoxicillin-Pot Clavulanate] Other (See Comments)    colitis  .  Cephalexin Other (See Comments)    colitis  . Erythromycin Other (See Comments)    colitis  . Niacin And Related Other (See Comments)    colitis  . Cefadroxil Other (See Comments)    Reported by [facility] 2013 - unknown reaction  . Nitrofurantoin Macrocrystal Other (See Comments)    Reported by [facility] 2013 - unknown reaction     PERTINENT MEDICATIONS:  Outpatient Encounter Medications as of 08/16/2018  Medication Sig  . albuterol (PROVENTIL HFA;VENTOLIN HFA) 108 (90 Base)  MCG/ACT inhaler Inhale 2 puffs into the lungs every 6 (six) hours as needed for wheezing or shortness of breath.  Marland Kitchen amLODipine (NORVASC) 10 MG tablet Take 1 tablet (10 mg total) by mouth daily.  Marland Kitchen aspirin EC 81 MG tablet Take 81 mg by mouth daily.  . budesonide-formoterol (SYMBICORT) 160-4.5 MCG/ACT inhaler Inhale 2 puffs into the lungs 2 (two) times daily.  . Cholecalciferol (VITAMIN D3) 50 MCG (2000 UT) capsule Take by mouth.  . clopidogrel (PLAVIX) 75 MG tablet Take 1 tablet (75 mg total) by mouth daily.  Marland Kitchen ezetimibe (ZETIA) 10 MG tablet Take 1 tablet (10 mg total) by mouth daily.  . ferrous sulfate 325 (65 FE) MG tablet Take 650 mg by mouth daily.   . fluticasone (FLONASE) 50 MCG/ACT nasal spray Place 2 sprays into both nostrils daily.  . furosemide (LASIX) 20 MG tablet Take 1 tablet (20 mg total) by mouth daily as needed for edema.  . haloperidol (HALDOL) 0.5 MG tablet Take 1 tablet (0.5 mg total) by mouth 2 (two) times daily as needed for agitation.  . isosorbide mononitrate (IMDUR) 30 MG 24 hr tablet Take 30 mg by mouth daily.   Marland Kitchen lamoTRIgine (LAMICTAL) 200 MG tablet Take 1.5 tablets (300 mg total) by mouth See admin instructions. Take 1 1/2 tablets (300 mg) by mouth twice daily - 8am and 2pm  . losartan (COZAAR) 25 MG tablet Take 1 tablet (25 mg total) by mouth daily.  . metoprolol tartrate (LOPRESSOR) 50 MG tablet Take 1 tablet (50 mg total) by mouth 2 (two) times daily.  . Omega-3 Fatty Acids (FISH OIL) 1000 MG CAPS Take 2,000 mg by mouth daily.  . phenytoin (DILANTIN) 100 MG ER capsule T 3 CAPS (300MG ) PO HS ND  . phenytoin (DILANTIN) 30 MG ER capsule Take 1 capsule (30 mg total) by mouth at bedtime.  . rosuvastatin (CRESTOR) 40 MG tablet Take 40 mg by mouth daily.  Marland Kitchen Spacer/Aero Chamber Mouthpiece MISC 1 Device by Does not apply route daily as needed.  Marland Kitchen spironolactone (ALDACTONE) 50 MG tablet Take 1 tablet (50 mg total) by mouth daily.  Marland Kitchen tiotropium (SPIRIVA HANDIHALER) 18 MCG  inhalation capsule Place 1 capsule (18 mcg total) into inhaler and inhale daily.  Marland Kitchen tiZANidine (ZANAFLEX) 4 MG tablet Take 1 tablet (4 mg total) by mouth every 8 (eight) hours as needed for muscle spasms.   No facility-administered encounter medications on file as of 08/16/2018.     Amy Jenetta Downer, NP

## 2018-09-06 ENCOUNTER — Other Ambulatory Visit: Payer: Self-pay | Admitting: *Deleted

## 2018-09-06 DIAGNOSIS — I10 Essential (primary) hypertension: Secondary | ICD-10-CM

## 2018-09-06 MED ORDER — LOSARTAN POTASSIUM 25 MG PO TABS: 25.0000 mg | ORAL_TABLET | Freq: Every day | ORAL | 1 refills | Status: DC

## 2018-09-11 ENCOUNTER — Other Ambulatory Visit: Payer: Self-pay | Admitting: Family Medicine

## 2018-09-11 MED ORDER — LOSARTAN POTASSIUM 25 MG PO TABS: 25.0000 mg | ORAL_TABLET | Freq: Every day | ORAL | 1 refills | Status: DC

## 2018-09-11 MED ORDER — METOPROLOL TARTRATE 50 MG PO TABS: 50.0000 mg | ORAL_TABLET | Freq: Two times a day (BID) | ORAL | 2 refills | Status: DC

## 2018-09-11 NOTE — Telephone Encounter (Signed)
Please advise, attempted to send refill box popped up stated this pharmacy does not accept electronic refills

## 2018-09-11 NOTE — Addendum Note (Signed)
Addended by: Modena Morrow R on: 09/11/2018 11:20 AM   Modules accepted: Orders

## 2018-09-11 NOTE — Telephone Encounter (Signed)
Refill sent in

## 2018-09-11 NOTE — Telephone Encounter (Signed)
Medication Refill - Medication: metoprolol tartrate (LOPRESSOR) 50 MG tablet   Has the patient contacted their pharmacy? Yes.   Pts daughter states the pharmacy has tried to contact office. She states pt has 1 pill left. Please advise.  (Agent: If no, request that the patient contact the pharmacy for the refill.) (Agent: If yes, when and what did the pharmacy advise?)  Preferred Pharmacy (with phone number or street name):  [place], [place] - [address]  [address] [address]  Phone: [phone] Fax: [phone]  Not a 24 hour pharmacy; exact hours not known.     Agent: Please be advised that RX refills may take up to 3 business days. We ask that you follow-up with your pharmacy.

## 2018-09-11 NOTE — Telephone Encounter (Signed)
[facility] never received this Rx losartan (COZAAR) 25 MG tablet  Fax:  [phone]  Rx is to be faxed to them and they sent\d to [facility].  Pt is completely out and needs a 7 day Rx called into  [id number] - [place], [place] [address] AT [address] [phone] (Phone) [phone] (Fax)

## 2018-09-15 ENCOUNTER — Other Ambulatory Visit: Payer: Self-pay | Admitting: Family Medicine

## 2018-09-15 DIAGNOSIS — E1149 Type 2 diabetes mellitus with other diabetic neurological complication: Secondary | ICD-10-CM

## 2018-09-15 MED ORDER — BASAGLAR KWIKPEN 100 UNIT/ML ~~LOC~~ SOPN: 10.0000 [IU] | PEN_INJECTOR | Freq: Every day | SUBCUTANEOUS | 3 refills | Status: DC

## 2018-09-18 ENCOUNTER — Encounter (HOSPITAL_COMMUNITY): Payer: Self-pay | Admitting: Emergency Medicine

## 2018-09-18 ENCOUNTER — Emergency Department (HOSPITAL_COMMUNITY)
Admission: EM | Admit: 2018-09-18 | Discharge: 2018-09-19 | Disposition: A | Discharge status: Skilled Nursing Facility | Payer: Medicare Other | Attending: Emergency Medicine | Admitting: Emergency Medicine

## 2018-09-18 ENCOUNTER — Other Ambulatory Visit: Payer: Self-pay

## 2018-09-18 DIAGNOSIS — E782 Mixed hyperlipidemia: Secondary | ICD-10-CM | POA: Diagnosis not present

## 2018-09-18 DIAGNOSIS — R739 Hyperglycemia, unspecified: Secondary | ICD-10-CM | POA: Diagnosis not present

## 2018-09-18 DIAGNOSIS — Z79899 Other long term (current) drug therapy: Secondary | ICD-10-CM | POA: Insufficient documentation

## 2018-09-18 DIAGNOSIS — R404 Transient alteration of awareness: Secondary | ICD-10-CM | POA: Diagnosis not present

## 2018-09-18 DIAGNOSIS — I2510 Atherosclerotic heart disease of native coronary artery without angina pectoris: Secondary | ICD-10-CM | POA: Insufficient documentation

## 2018-09-18 DIAGNOSIS — Z87891 Personal history of nicotine dependence: Secondary | ICD-10-CM | POA: Diagnosis not present

## 2018-09-18 DIAGNOSIS — E162 Hypoglycemia, unspecified: Secondary | ICD-10-CM | POA: Diagnosis present

## 2018-09-18 DIAGNOSIS — Z7982 Long term (current) use of aspirin: Secondary | ICD-10-CM | POA: Diagnosis not present

## 2018-09-18 DIAGNOSIS — E11649 Type 2 diabetes mellitus with hypoglycemia without coma: Secondary | ICD-10-CM | POA: Insufficient documentation

## 2018-09-18 DIAGNOSIS — Z8673 Personal history of transient ischemic attack (TIA), and cerebral infarction without residual deficits: Secondary | ICD-10-CM | POA: Insufficient documentation

## 2018-09-18 DIAGNOSIS — E1165 Type 2 diabetes mellitus with hyperglycemia: Secondary | ICD-10-CM | POA: Diagnosis not present

## 2018-09-18 DIAGNOSIS — Z794 Long term (current) use of insulin: Secondary | ICD-10-CM | POA: Insufficient documentation

## 2018-09-18 DIAGNOSIS — F0390 Unspecified dementia without behavioral disturbance: Secondary | ICD-10-CM | POA: Diagnosis not present

## 2018-09-18 DIAGNOSIS — J449 Chronic obstructive pulmonary disease, unspecified: Secondary | ICD-10-CM | POA: Diagnosis not present

## 2018-09-18 DIAGNOSIS — I10 Essential (primary) hypertension: Secondary | ICD-10-CM | POA: Diagnosis not present

## 2018-09-18 DIAGNOSIS — I252 Old myocardial infarction: Secondary | ICD-10-CM | POA: Diagnosis not present

## 2018-09-18 DIAGNOSIS — R4020 Unspecified coma: Secondary | ICD-10-CM | POA: Diagnosis not present

## 2018-09-18 DIAGNOSIS — R4182 Altered mental status, unspecified: Secondary | ICD-10-CM | POA: Diagnosis not present

## 2018-09-18 LAB — CBG MONITORING, ED
Glucose-Capillary: >600 mg/dL (ref 70–99)
Glucose-Capillary: 491 mg/dL — ABNORMAL HIGH (ref 70–99)
Glucose-Capillary: 312 mg/dL — ABNORMAL HIGH (ref 70–99)

## 2018-09-18 LAB — BLOOD GAS, VENOUS
Acid-Base Excess: 1.6 mmol/L (ref 0.0–2.0)
Bicarbonate: 28.5 mmol/L — ABNORMAL HIGH (ref 20.0–28.0)
O2 Saturation: 31.9 %
Patient temperature: 98.6
pCO2, Ven: 56.6 mmHg (ref 44.0–60.0)
pH, Ven: 7.322 (ref 7.250–7.430)

## 2018-09-18 LAB — CBC WITH DIFFERENTIAL/PLATELET
Abs Immature Granulocytes: 0.04 10*3/uL (ref 0.00–0.07)
Basophils Absolute: 0 10*3/uL (ref 0.0–0.1)
Basophils Relative: 1 %
Eosinophils Absolute: 0.1 10*3/uL (ref 0.0–0.5)
Eosinophils Relative: 2 %
HCT: 39.5 % (ref 36.0–46.0)
Hemoglobin: 13.5 g/dL (ref 12.0–15.0)
Immature Granulocytes: 1 %
Lymphocytes Relative: 22 %
Lymphs Abs: 0.9 10*3/uL (ref 0.7–4.0)
MCH: 29.8 pg (ref 26.0–34.0)
MCHC: 34.2 g/dL (ref 30.0–36.0)
MCV: 87.2 fL (ref 80.0–100.0)
Monocytes Absolute: 0.4 10*3/uL (ref 0.1–1.0)
Monocytes Relative: 9 %
Neutro Abs: 2.9 10*3/uL (ref 1.7–7.7)
Neutrophils Relative %: 65 %
Platelets: 176 10*3/uL (ref 150–400)
RBC: 4.53 MIL/uL (ref 3.87–5.11)
RDW: 13.6 % (ref 11.5–15.5)
WBC: 4.4 10*3/uL (ref 4.0–10.5)
nRBC: 0 % (ref 0.0–0.2)

## 2018-09-18 LAB — URINALYSIS, ROUTINE W REFLEX MICROSCOPIC
Bacteria, UA: NONE SEEN
Bilirubin Urine: NEGATIVE
Glucose, UA: >=500 mg/dL — AB
Hgb urine dipstick: NEGATIVE
Ketones, ur: NEGATIVE mg/dL
Nitrite: NEGATIVE
Protein, ur: NEGATIVE mg/dL
Specific Gravity, Urine: 1.024 (ref 1.005–1.030)
pH: 7 (ref 5.0–8.0)

## 2018-09-18 LAB — COMPREHENSIVE METABOLIC PANEL
ALT: 28 U/L (ref 0–44)
AST: 34 U/L (ref 15–41)
Albumin: 4.1 g/dL (ref 3.5–5.0)
Alkaline Phosphatase: 131 U/L — ABNORMAL HIGH (ref 38–126)
Anion gap: 11 (ref 5–15)
BUN: 11 mg/dL (ref 8–23)
CO2: 25 mmol/L (ref 22–32)
Calcium: 9.8 mg/dL (ref 8.9–10.3)
Chloride: 96 mmol/L — ABNORMAL LOW (ref 98–111)
Creatinine, Ser: 0.96 mg/dL (ref 0.44–1.00)
GFR calc Af Amer: >60 mL/min (ref >60)
GFR calc non Af Amer: 59 mL/min — ABNORMAL LOW (ref >60)
Glucose, Bld: 612 mg/dL (ref 70–99)
Potassium: 4.8 mmol/L (ref 3.5–5.1)
Sodium: 132 mmol/L — ABNORMAL LOW (ref 135–145)
Total Bilirubin: 0.3 mg/dL (ref 0.3–1.2)
Total Protein: 7.5 g/dL (ref 6.5–8.1)

## 2018-09-18 LAB — ETHANOL: Alcohol, Ethyl (B): <10 mg/dL (ref <10)

## 2018-09-18 MED ORDER — SODIUM CHLORIDE 0.9 % IV BOLUS
1000.0000 mL | Freq: Once | INTRAVENOUS | Status: AC
  Administered 2018-09-18: 1000 mL via INTRAVENOUS

## 2018-09-18 MED ORDER — INSULIN ASPART 100 UNIT/ML ~~LOC~~ SOLN
5.0000 [IU] | Freq: Once | SUBCUTANEOUS | Status: AC
  Administered 2018-09-18: 5 [IU] via INTRAVENOUS
  Filled 2018-09-18: qty 0.05

## 2018-09-18 NOTE — ED Notes (Signed)
Date and time results received: 09/18/18 9:47 PM  (use smartphrase ".now" to insert current time)  Test: Glucose Critical Value: 612  Name of Provider Notified:Dr.Lockwood  Orders Received? Or Actions Taken?:Order for additional insulin to be given.

## 2018-09-18 NOTE — ED Triage Notes (Signed)
Pt arriving via [facility] from [facility] for hyperglycemia. Staff reports pts blood sugar was in the 600's. Pt at cognitive baseline per staff report. Has previous memory deficits from stroke.

## 2018-09-18 NOTE — Discharge Instructions (Addendum)
As discussed, your evaluation today has been largely reassuring.  But, it is important that you monitor your condition carefully, and do not hesitate to return to the ED if you develop new, or concerning changes in your condition.  Otherwise, please follow-up with your physician for appropriate ongoing care.  Please be sure to convey to the dietary staff at your facility the importance of you maintaining a carb healthy diet.

## 2018-09-18 NOTE — ED Provider Notes (Signed)
[facility] DEPT Provider Note   CSN: [id number] Arrival date & time: 09/18/18  2004     History   Chief Complaint Chief Complaint  Patient presents with  . Hyperglycemia    HPI Kathryn Lewis is a 73 y.o. female.     HPI Presents from nursing facility with staff concerns of hypoglycemia. The patient self denies focal complaints, but acknowledges a history of prior stroke, states that she has difficulty with memory.  On she denies focal weakness, denies pain, denies nausea. Per EMS, patient was found to be hyperglycemic on arrival, with an abundance of snack foods around her. Level 5 caveat secondary to cognitive condition. Past Medical History:  Diagnosis Date  . Allergy   . Arthritis   . Bipolar 1 disorder ([place])   . Blood transfusion without reported diagnosis   . Carotid artery stenosis    50-69% bilateral ICA stenoses by velocity criteria but no visible plaque and ICA/CCA ratio 1.72 on right and 1.38 on left  . Coronary artery disease   . Depression   . Diabetes mellitus without complication ([place])   . Echocardiogram    Echo 06/2018: Hyperdynamic systolic function, EF 99991111, mild concentric LVH, elevated LVEDP (E/E' suggests impaired relaxation), mild AI, lipomatous interatrial septum  . Hypertension   . Left adrenal mass (H[facility])    2.5 cm L adrenal mass on CT per note of Dr. Seleta Rhymes 05/20/2014  . MI, old   . PAD (peripheral artery disease) ([place])    03/2014 - CT aortogram with lower extremity runoff (mild to moderate disease in common iliac arteries, complete occulusion of her bilateral external iliac arteries with heavily disease common femoral arteries. She also has disease to her SFAs bilaterally. Popliteal arteries and tibial vessel runoff appears to be adequate)  . Seizures ([place])   . Stroke [facility])     Patient Active Problem List   Diagnosis Date Noted  . PAD (peripheral artery disease) ([place]) 04/05/2018  . Carotid artery disease ([place])  04/05/2018  . Hypokalemia 02/27/2018  . Frequent falls 07/08/2017  . Back pain 07/08/2017  . Mixed hyperlipidemia 05/27/2017  . Unstable gait 01/10/2017  . Hyperlipidemia associated with type 2 diabetes mellitus ([place]) 01/10/2017  . OSA on CPAP 12/24/2016  . COPD (chronic obstructive pulmonary disease) ([place]) 12/24/2016  . Pulmonary nodule 12/24/2016  . Generalized weakness 12/12/2015  . Diabetes mellitus type 2 with neurological manifestations ([place]) 12/12/2015  . Essential hypertension 12/12/2015  . Seizure disorder as sequela of cerebrovascular accident ([place]) 04/17/2015  . CAD (coronary artery disease) 04/17/2015    Past Surgical History:  Procedure Laterality Date  . BREAST SURGERY  2012   small lump nonmilignant  . NECK SURGERY       OB History   No obstetric history on file.      Home Medications    Prior to Admission medications   Medication Sig Start Date End Date Taking? Authorizing Provider  albuterol (PROVENTIL HFA;VENTOLIN HFA) 108 (90 Base) MCG/ACT inhaler Inhale 2 puffs into the lungs every 6 (six) hours as needed for wheezing or shortness of breath. 03/21/18   Martinique, Betty G, MD  amLODipine (NORVASC) 10 MG tablet Take 1 tablet (10 mg total) by mouth daily. 01/10/17   Martinique, Betty G, MD  aspirin EC 81 MG tablet Take 81 mg by mouth daily.    [provider]  budesonide-formoterol (SYMBICORT) 160-4.5 MCG/ACT inhaler Inhale 2 puffs into the lungs 2 (two) times daily. 03/22/18  Martinique, Betty G, MD  Cholecalciferol (VITAMIN D3) 50 MCG (2000 UT) capsule Take by mouth.    [provider]  clopidogrel (PLAVIX) 75 MG tablet Take 1 tablet (75 mg total) by mouth daily. 01/10/17   Martinique, Betty G, MD  ezetimibe (ZETIA) 10 MG tablet Take 1 tablet (10 mg total) by mouth daily. 06/22/17   Martinique, Betty G, MD  ferrous sulfate 325 (65 FE) MG tablet Take 650 mg by mouth daily.     [provider]  fluticasone (FLONASE) 50 MCG/ACT nasal spray Place 2  sprays into both nostrils daily. 04/22/17   Nafziger, Tommi Rumps, NP  furosemide (LASIX) 20 MG tablet Take 1 tablet (20 mg total) by mouth daily as needed for edema. 03/21/18   Martinique, Betty G, MD  haloperidol (HALDOL) 0.5 MG tablet Take 1 tablet (0.5 mg total) by mouth 2 (two) times daily as needed for agitation. 04/24/18   Martinique, Betty G, MD  Insulin Glargine (BASAGLAR KWIKPEN) 100 UNIT/ML SOPN Inject 0.1 mLs (10 Units total) into the skin at bedtime. 09/15/18   Martinique, Betty G, MD  isosorbide mononitrate (IMDUR) 30 MG 24 hr tablet Take 30 mg by mouth daily.     [provider]  lamoTRIgine (LAMICTAL) 200 MG tablet Take 1.5 tablets (300 mg total) by mouth See admin instructions. Take 1 1/2 tablets (300 mg) by mouth twice daily - 8am and 2pm 03/13/18   Cameron Sprang, MD  losartan (COZAAR) 25 MG tablet Take 1 tablet (25 mg total) by mouth daily. 09/11/18   Martinique, Betty G, MD  metoprolol tartrate (LOPRESSOR) 50 MG tablet Take 1 tablet (50 mg total) by mouth 2 (two) times daily. 09/11/18   Martinique, Betty G, MD  Omega-3 Fatty Acids (FISH OIL) 1000 MG CAPS Take 2,000 mg by mouth daily.    [provider]  phenytoin (DILANTIN) 100 MG ER capsule T 3 CAPS (300MG ) PO HS ND 03/13/18   Cameron Sprang, MD  phenytoin (DILANTIN) 30 MG ER capsule Take 1 capsule (30 mg total) by mouth at bedtime. 03/13/18   Cameron Sprang, MD  rosuvastatin (CRESTOR) 40 MG tablet Take 40 mg by mouth daily.    [provider]  Spacer/Aero Chamber Mouthpiece MISC 1 Device by Does not apply route daily as needed. 12/24/16   Rigoberto Noel, MD  spironolactone (ALDACTONE) 50 MG tablet Take 1 tablet (50 mg total) by mouth daily. 05/27/17   Fay Records, MD  tiotropium (SPIRIVA HANDIHALER) 18 MCG inhalation capsule Place 1 capsule (18 mcg total) into inhaler and inhale daily. 06/21/18 09/19/18  Martinique, Betty G, MD  tiZANidine (ZANAFLEX) 4 MG tablet Take 1 tablet (4 mg total) by mouth every 8 (eight) hours as needed for muscle  spasms. 07/08/17   Martinique, Betty G, MD    Family History Family History  Problem Relation Age of Onset  . Hypertension Mother   . Arthritis Mother   . Diabetes Mother   . Early death Mother   . Hyperlipidemia Mother   . Stroke Mother   . Hypertension Father   . Alcohol abuse Father   . Early death Father   . Hyperlipidemia Father   . COPD Father   . Heart attack Father   . Hypertension Sister   . Birth defects Brother   . Depression Brother   . Hyperlipidemia Brother   . Hypertension Brother   . Arthritis Daughter   . Asthma Daughter   . COPD Daughter   .  Arthritis Son   . COPD Son   . Hypertension Son   . Hyperlipidemia Son   . Heart attack Son   . Arthritis Maternal Grandmother   . Cancer Maternal Grandmother     Social History Social History   Tobacco Use  . Smoking status: Former Smoker    Packs/day: 1.00    Types: Cigarettes    Quit date: 2015    Years since quitting: 5.6  . Smokeless tobacco: Never Used  Substance Use Topics  . Alcohol use: No  . Drug use: No     Allergies   Influenza vaccines, Amoxicillin, Augmentin [amoxicillin-pot clavulanate], Cephalexin, Erythromycin, Niacin and related, Cefadroxil, and Nitrofurantoin macrocrystal   Review of Systems Review of Systems  Unable to perform ROS: Other  Prior stroke, cognitive dysfunction  Physical Exam Updated Vital Signs BP (!) 197/82 (BP Location: Right Arm)   Pulse 65   Temp 98.5 F (36.9 C) (Oral)   Resp 15   LMP 03/17/2015   SpO2 98%   Physical Exam Vitals signs and nursing note reviewed.  Constitutional:      General: She is not in acute distress.    Appearance: She is well-developed.  HENT:     Head: Normocephalic and atraumatic.  Eyes:     Conjunctiva/sclera: Conjunctivae normal.  Cardiovascular:     Rate and Rhythm: Normal rate and regular rhythm.  Pulmonary:     Effort: Pulmonary effort is normal. No respiratory distress.     Breath sounds: Normal breath sounds. No  stridor.  Abdominal:     General: There is no distension.  Skin:    General: Skin is warm and dry.  Neurological:     Mental Status: She is alert.     Cranial Nerves: No cranial nerve deficit.     Motor: Motor function is intact.     Coordination: Coordination is intact.  Psychiatric:        Behavior: Behavior is slowed and withdrawn.        Cognition and Memory: Cognition is impaired.      ED Treatments / Results  Labs (all labs ordered are listed, but only abnormal results are displayed) Labs Reviewed  COMPREHENSIVE METABOLIC PANEL - Abnormal; Notable for the following components:      Result Value   Sodium 132 (*)    Chloride 96 (*)    Glucose, Bld 612 (*)    Alkaline Phosphatase 131 (*)    GFR calc non Af Amer 59 (*)    All other components within normal limits  URINALYSIS, ROUTINE W REFLEX MICROSCOPIC - Abnormal; Notable for the following components:   Color, Urine STRAW (*)    Glucose, UA >=500 (*)    Leukocytes,Ua SMALL (*)    All other components within normal limits  BLOOD GAS, VENOUS - Abnormal; Notable for the following components:   Bicarbonate 28.5 (*)    All other components within normal limits  CBG MONITORING, ED - Abnormal; Notable for the following components:   Glucose-Capillary >600 (*)    All other components within normal limits  CBG MONITORING, ED - Abnormal; Notable for the following components:   Glucose-Capillary 491 (*)    All other components within normal limits  ETHANOL  CBC WITH DIFFERENTIAL/PLATELET  CBG MONITORING, ED    Procedures Procedures (including critical care time)  Medications Ordered in ED Medications  sodium chloride 0.9 % bolus 1,000 mL (1,000 mLs Intravenous New Bag/Given 09/18/18 2035)     Initial Impression /  Assessment and Plan / ED Course  I have reviewed the triage vital signs and the nursing notes.  Pertinent labs & imaging results that were available during my care of the patient were reviewed by me and  considered in my medical decision making (see chart for details).    Point-of-care glucose greater than 600.    On re-exam he is in no distress awake, alert, smiling.  She is now accompanied by her daughter. Together we had a lengthy conversation about the patient's living situation, difficulty with appropriate dietary intake acknowledging her diabetes.  Remaining labs notable for no anion gap, and glucose is reduced appropriately with fluids, insulin. This adult female with diabetes presents due to concern of hyperglycemia. Here she is awake, alert, no distress, vital signs unremarkable, no anion gap, no evidence for decompensated disease. Patient improved here was discharged in stable condition.  Final Clinical Impressions(s) / ED Diagnoses  Hyperglycemia   Carmin Muskrat, MD 09/18/18 2308

## 2018-09-19 ENCOUNTER — Telehealth: Payer: Self-pay | Admitting: Family Medicine

## 2018-09-19 ENCOUNTER — Emergency Department (HOSPITAL_COMMUNITY)
Admission: EM | Admit: 2018-09-19 | Discharge: 2018-09-20 | Disposition: A | Discharge status: Home / Self Care | Payer: Medicare Other | Source: Home / Self Care | Attending: Emergency Medicine | Admitting: Emergency Medicine

## 2018-09-19 ENCOUNTER — Other Ambulatory Visit: Payer: Self-pay | Admitting: Family Medicine

## 2018-09-19 ENCOUNTER — Other Ambulatory Visit: Payer: Self-pay

## 2018-09-19 ENCOUNTER — Ambulatory Visit: Payer: Self-pay

## 2018-09-19 ENCOUNTER — Encounter (HOSPITAL_COMMUNITY): Payer: Self-pay

## 2018-09-19 DIAGNOSIS — R739 Hyperglycemia, unspecified: Secondary | ICD-10-CM

## 2018-09-19 DIAGNOSIS — M2550 Pain in unspecified joint: Secondary | ICD-10-CM | POA: Diagnosis not present

## 2018-09-19 DIAGNOSIS — E11649 Type 2 diabetes mellitus with hypoglycemia without coma: Secondary | ICD-10-CM | POA: Diagnosis not present

## 2018-09-19 DIAGNOSIS — R5381 Other malaise: Secondary | ICD-10-CM | POA: Diagnosis not present

## 2018-09-19 DIAGNOSIS — E1165 Type 2 diabetes mellitus with hyperglycemia: Secondary | ICD-10-CM | POA: Diagnosis not present

## 2018-09-19 DIAGNOSIS — Z7401 Bed confinement status: Secondary | ICD-10-CM | POA: Diagnosis not present

## 2018-09-19 DIAGNOSIS — I10 Essential (primary) hypertension: Secondary | ICD-10-CM | POA: Diagnosis not present

## 2018-09-19 LAB — CBC
HCT: 37.9 % (ref 36.0–46.0)
Hemoglobin: 12.6 g/dL (ref 12.0–15.0)
MCH: 29.1 pg (ref 26.0–34.0)
MCHC: 33.2 g/dL (ref 30.0–36.0)
MCV: 87.5 fL (ref 80.0–100.0)
Platelets: 178 10*3/uL (ref 150–400)
RBC: 4.33 MIL/uL (ref 3.87–5.11)
RDW: 13.5 % (ref 11.5–15.5)
WBC: 3.7 10*3/uL — ABNORMAL LOW (ref 4.0–10.5)
nRBC: 0 % (ref 0.0–0.2)

## 2018-09-19 LAB — BLOOD GAS, VENOUS
Acid-base deficit: 1.3 mmol/L (ref 0.0–2.0)
Bicarbonate: 23.9 mmol/L (ref 20.0–28.0)
O2 Saturation: 58.6 %
Patient temperature: 98.6
pCO2, Ven: 44.4 mmHg (ref 44.0–60.0)
pH, Ven: 7.351 (ref 7.250–7.430)
pO2, Ven: 32.5 mmHg (ref 32.0–45.0)

## 2018-09-19 LAB — HEPATIC FUNCTION PANEL
ALT: 24 U/L (ref 0–44)
AST: 43 U/L — ABNORMAL HIGH (ref 15–41)
Albumin: 3.5 g/dL (ref 3.5–5.0)
Alkaline Phosphatase: 111 U/L (ref 38–126)
Bilirubin, Direct: 0.4 mg/dL — ABNORMAL HIGH (ref 0.0–0.2)
Indirect Bilirubin: 0.5 mg/dL (ref 0.3–0.9)
Total Bilirubin: 0.9 mg/dL (ref 0.3–1.2)
Total Protein: 6.5 g/dL (ref 6.5–8.1)

## 2018-09-19 LAB — BASIC METABOLIC PANEL
Anion gap: 8 (ref 5–15)
BUN: 10 mg/dL (ref 8–23)
CO2: 22 mmol/L (ref 22–32)
Calcium: 8.7 mg/dL — ABNORMAL LOW (ref 8.9–10.3)
Chloride: 102 mmol/L (ref 98–111)
Creatinine, Ser: 0.95 mg/dL (ref 0.44–1.00)
GFR calc Af Amer: >60 mL/min (ref >60)
GFR calc non Af Amer: 60 mL/min — ABNORMAL LOW (ref >60)
Glucose, Bld: 516 mg/dL (ref 70–99)
Potassium: 4.9 mmol/L (ref 3.5–5.1)
Sodium: 132 mmol/L — ABNORMAL LOW (ref 135–145)

## 2018-09-19 LAB — URINALYSIS, ROUTINE W REFLEX MICROSCOPIC
Bacteria, UA: NONE SEEN
Bilirubin Urine: NEGATIVE
Glucose, UA: >=500 mg/dL — AB
Hgb urine dipstick: NEGATIVE
Ketones, ur: NEGATIVE mg/dL
Leukocytes,Ua: NEGATIVE
Nitrite: NEGATIVE
Protein, ur: NEGATIVE mg/dL
Specific Gravity, Urine: 1.023 (ref 1.005–1.030)
pH: 6 (ref 5.0–8.0)

## 2018-09-19 LAB — CBG MONITORING, ED
Glucose-Capillary: 417 mg/dL — ABNORMAL HIGH (ref 70–99)
Glucose-Capillary: 514 mg/dL (ref 70–99)

## 2018-09-19 MED ORDER — LANTUS SOLOSTAR 100 UNIT/ML ~~LOC~~ SOPN: 10.0000 [IU] | PEN_INJECTOR | Freq: Every day | SUBCUTANEOUS | 1 refills | Status: DC

## 2018-09-19 MED ORDER — SODIUM CHLORIDE 0.9 % IV BOLUS
1000.0000 mL | Freq: Once | INTRAVENOUS | Status: AC
  Administered 2018-09-19: 1000 mL via INTRAVENOUS

## 2018-09-19 NOTE — ED Notes (Signed)
Spoke with Ovid Curd in lab to add on Hep func panel

## 2018-09-19 NOTE — Telephone Encounter (Signed)
Spoke with Lexine Baton from [facility] and gave recommendations per Dr. Martinique. Lexine Baton stated that they needed an order faxed to the facility with directions. Ordered faxed to [facility] as requested.

## 2018-09-19 NOTE — Telephone Encounter (Signed)
Incoming call fro [facility] Nurse . Nurse named Clarene Critchley Phone [phone] who states that Pt. Blood sugar is 541.  Last night it was 700. Went to hospital  Last night. Insurance wont cover previous Rx.  recommends Lantus.  Had am dose of  Zetia 10mg . Home Health nurse awaits return phone callfax, fax number. [phone].        Answer Assessment - Initial Assessment Questions 1. BLOOD GLUCOSE: "What is your blood glucose level?"    541 2. ONSET: "When did you check the blood glucose?"     2:00pm 3. USUAL RANGE: "What is your glucose level usually?" (e.g., usual fasting morning value, usual evening value)     4. KETONES: "Do you check for ketones (urine or blood test strips)?" If yes, ask: "What does the test show now?"      na 5. TYPE 1 or 2:  "Do you know what type of diabetes you have?"  (e.g., Type 1, Type 2, Gestational; doesn't know)      Type 2 6. INSULIN: "Do you take insulin?" "What type of insulin(s) do you use? What is the mode of delivery? (syringe, pen; injection or pump)?"      Have order for unsulin, [employer], latntus recommended. 7. DIABETES PILLS: "Do you take any pills for your diabetes?" If yes, ask: "Have you missed taking any pills recently?"     zetia  10mg  am  8. OTHER SYMPTOMS: "Do you have any symptoms?" (e.g., fever, frequent urination, difficulty breathing, dizziness, weakness, vomiting)    denies 9. PREGNANCY: "Is there any chance you are pregnant?" "When was your last menstrual period?"    na  Protocols used: DIABETES - HIGH BLOOD SUGAR-A-AH

## 2018-09-19 NOTE — Telephone Encounter (Signed)
Message sent to Dr. Jordan for review. 

## 2018-09-19 NOTE — ED Notes (Signed)
[facility] contacted for discharge transport 

## 2018-09-19 NOTE — ED Notes (Addendum)
Pt aware that urine sample is needed.  

## 2018-09-19 NOTE — Telephone Encounter (Signed)
Spoke with Lexine Baton from [facility] and gave recommendations per Dr. Martinique. Lexine Baton stated that they needed an order faxed to the facility with directions. Ordered faxed to [facility] as requested.

## 2018-09-19 NOTE — ED Triage Notes (Addendum)
Per ems: Pt coming from [place] c/o hyperglycemia. At 6:30, pt's cbg was 512 and received all medication. Staff rechecked cbg at 7:30 pm, cbg said "high" (> 600). EMS's cbg was  >600. No dizzness, no n/v/d  450 mL of NS   20 G R hand

## 2018-09-19 NOTE — Telephone Encounter (Signed)
See note

## 2018-09-19 NOTE — Telephone Encounter (Signed)
I sent Rx for Lantus to start 10 U daily at bedtime. Dose can be increase in 7 days to 15 U if still having BS's 200 or above. She needs a follow up visit. Thanks, BJ

## 2018-09-19 NOTE — ED Provider Notes (Signed)
[facility] DEPT Provider Note   CSN: [id number] Arrival date & time: 09/19/18  2019     History   Chief Complaint Chief Complaint  Patient presents with   Hyperglycemia    HPI Kathryn Lewis is a 73 y.o. female.     HPI  Pt is a 73 y/o female who presents to the ED today from [facility] for eval of hyperglycemia. She was seen in the ED yesterday for similar complaints and was discharged after reassuring w/u.  She denies chest pain, SOB, fever, cough, NVD, abd pain, dysuria. She reports urinary frequency. Reports increased thirst.   States she has a h/o memory issues 2/2 prior CVA and therefore hx is limited. She is unable to tell me why she is here.  Level 5 caveat secondary to this  9:02 PM Spoke with med tech, Kathryn Lewis, from [employer] who confirms that patient is confused at baseline.  She states that she recently had her insulin increased.  She is now taking Lantus 10 units at night.  She received her first dose tonight.  She also had her night blood pressure medicines just prior to arrival.  Past Medical History:  Diagnosis Date   Allergy    Arthritis    Bipolar 1 disorder ([place])    Blood transfusion without reported diagnosis    Carotid artery stenosis    50-69% bilateral ICA stenoses by velocity criteria but no visible plaque and ICA/CCA ratio 1.72 on right and 1.38 on left   Coronary artery disease    Depression    Diabetes mellitus without complication (HCC)    Echocardiogram    Echo 06/2018: Hyperdynamic systolic function, EF 99991111, mild concentric LVH, elevated LVEDP (E/E' suggests impaired relaxation), mild AI, lipomatous interatrial septum   Hypertension    Left adrenal mass (HCC)    2.5 cm L adrenal mass on CT per note of Dr. Seleta Rhymes 05/20/2014   MI, old    PAD (peripheral artery disease) ([place])    03/2014 - CT aortogram with lower extremity runoff (mild to moderate disease in common iliac arteries, complete  occulusion of her bilateral external iliac arteries with heavily disease common femoral arteries. She also has disease to her SFAs bilaterally. Popliteal arteries and tibial vessel runoff appears to be adequate)   Seizures ([place])    Stroke [facility])     Patient Active Problem List   Diagnosis Date Noted   PAD (peripheral artery disease) ([place]) 04/05/2018   Carotid artery disease ([place]) 04/05/2018   Hypokalemia 02/27/2018   Frequent falls 07/08/2017   Back pain 07/08/2017   Mixed hyperlipidemia 05/27/2017   Unstable gait 01/10/2017   Hyperlipidemia associated with type 2 diabetes mellitus ([place]) 01/10/2017   OSA on CPAP 12/24/2016   COPD (chronic obstructive pulmonary disease) ([place]) 12/24/2016   Pulmonary nodule 12/24/2016   Generalized weakness 12/12/2015   Diabetes mellitus type 2 with neurological manifestations ([place]) 12/12/2015   Essential hypertension 12/12/2015   Seizure disorder as sequela of cerebrovascular accident ([place]) 04/17/2015   CAD (coronary artery disease) 04/17/2015    Past Surgical History:  Procedure Laterality Date   BREAST SURGERY  2012   small lump nonmilignant   NECK SURGERY       OB History   No obstetric history on file.      Home Medications    Prior to Admission medications   Medication Sig Start Date End Date Taking? Authorizing Provider  acetaminophen (TYLENOL) 500 MG tablet Take 500 mg  by mouth 2 (two) times daily as needed for mild pain.   Yes [provider]  albuterol (PROVENTIL HFA;VENTOLIN HFA) 108 (90 Base) MCG/ACT inhaler Inhale 2 puffs into the lungs every 6 (six) hours as needed for wheezing or shortness of breath. 03/21/18  Yes Martinique, Betty G, MD  amLODipine (NORVASC) 10 MG tablet Take 1 tablet (10 mg total) by mouth daily. 01/10/17  Yes Martinique, Betty G, MD  aspirin EC 81 MG tablet Take 81 mg by mouth daily.   Yes [provider]  Cholecalciferol (VITAMIN D3) 50 MCG (2000 UT) capsule Take 2,000 Units by  mouth daily.    Yes [provider]  clopidogrel (PLAVIX) 75 MG tablet Take 1 tablet (75 mg total) by mouth daily. 01/10/17  Yes Martinique, Betty G, MD  ezetimibe (ZETIA) 10 MG tablet Take 1 tablet (10 mg total) by mouth daily. 06/22/17  Yes Martinique, Betty G, MD  ferrous sulfate 325 (65 FE) MG tablet Take 650 mg by mouth daily.    Yes [provider]  fluticasone (FLONASE) 50 MCG/ACT nasal spray Place 2 sprays into both nostrils daily. 04/22/17  Yes Nafziger, Tommi Rumps, NP  furosemide (LASIX) 20 MG tablet Take 1 tablet (20 mg total) by mouth daily as needed for edema. 03/21/18  Yes Martinique, Betty G, MD  guaiFENesin (MUCINEX) 600 MG 12 hr tablet Take 600 mg by mouth daily as needed for cough.   Yes [provider]  Insulin Glargine (LANTUS SOLOSTAR) 100 UNIT/ML Solostar Pen Inject 10 Units into the skin at bedtime. 09/19/18  Yes Martinique, Betty G, MD  isosorbide mononitrate (IMDUR) 30 MG 24 hr tablet Take 30 mg by mouth daily.    Yes [provider]  lamoTRIgine (LAMICTAL) 200 MG tablet Take 1.5 tablets (300 mg total) by mouth See admin instructions. Take 1 1/2 tablets (300 mg) by mouth twice daily - 8am and 2pm 03/13/18  Yes Cameron Sprang, MD  losartan (COZAAR) 25 MG tablet Take 1 tablet (25 mg total) by mouth daily. 09/11/18  Yes Martinique, Betty G, MD  metoprolol tartrate (LOPRESSOR) 50 MG tablet Take 1 tablet (50 mg total) by mouth 2 (two) times daily. 09/11/18  Yes Martinique, Betty G, MD  Omega-3 Fatty Acids (FISH OIL) 1000 MG CAPS Take 2,000 mg by mouth daily.   Yes [provider]  phenytoin (DILANTIN) 100 MG ER capsule T 3 CAPS (300MG ) PO HS ND 03/13/18  Yes Cameron Sprang, MD  phenytoin (DILANTIN) 30 MG ER capsule Take 1 capsule (30 mg total) by mouth at bedtime. 03/13/18  Yes Cameron Sprang, MD  rosuvastatin (CRESTOR) 40 MG tablet Take 40 mg by mouth daily.   Yes [provider]  spironolactone (ALDACTONE) 50 MG tablet Take 1 tablet (50 mg total) by mouth  daily. 05/27/17  Yes Fay Records, MD  tiotropium (SPIRIVA HANDIHALER) 18 MCG inhalation capsule Place 1 capsule (18 mcg total) into inhaler and inhale daily. 06/21/18 09/19/18 Yes Martinique, Betty G, MD  budesonide-formoterol [facility]) 160-4.5 MCG/ACT inhaler Inhale 2 puffs into the lungs 2 (two) times daily. Patient not taking: Reported on 09/19/2018 03/22/18   Martinique, Betty G, MD  haloperidol (HALDOL) 0.5 MG tablet Take 1 tablet (0.5 mg total) by mouth 2 (two) times daily as needed for agitation. Patient not taking: Reported on 09/19/2018 04/24/18   Martinique, Betty G, MD  Spacer/Aero Chamber Mouthpiece MISC 1 Device by Does not apply route daily as needed. 12/24/16   Rigoberto Noel,  MD  tiZANidine (ZANAFLEX) 4 MG tablet Take 1 tablet (4 mg total) by mouth every 8 (eight) hours as needed for muscle spasms. Patient not taking: Reported on 09/19/2018 07/08/17   Martinique, Betty G, MD    Family History Family History  Problem Relation Age of Onset   Hypertension Mother    Arthritis Mother    Diabetes Mother    Early death Mother    Hyperlipidemia Mother    Stroke Mother    Hypertension Father    Alcohol abuse Father    Early death Father    Hyperlipidemia Father    COPD Father    Heart attack Father    Hypertension Sister    Birth defects Brother    Depression Brother    Hyperlipidemia Brother    Hypertension Brother    Arthritis Daughter    Asthma Daughter    COPD Daughter    Arthritis Son    COPD Son    Hypertension Son    Hyperlipidemia Son    Heart attack Son    Arthritis Maternal Grandmother    Cancer Maternal Grandmother     Social History Social History   Tobacco Use   Smoking status: Former Smoker    Packs/day: 1.00    Types: Cigarettes    Quit date: 2015    Years since quitting: 5.6   Smokeless tobacco: Never Used  Substance Use Topics   Alcohol use: No   Drug use: No     Allergies   Influenza vaccines, Amoxicillin, Augmentin  [amoxicillin-pot clavulanate], Cephalexin, Erythromycin, Niacin and related, Cefadroxil, and Nitrofurantoin macrocrystal   Review of Systems Review of Systems  Unable to perform ROS: Dementia  Respiratory: Negative for shortness of breath.   Cardiovascular: Negative for chest pain.  Gastrointestinal: Negative for abdominal pain, constipation, diarrhea, nausea and vomiting.  Endocrine: Positive for polydipsia and polyuria.  Genitourinary: Positive for frequency.     Physical Exam Updated Vital Signs BP (!) 197/79    Pulse 71    Temp 98.8 F (37.1 C) (Oral)    Resp 17    Ht 5\' 1"  (1.549 m)    Wt 63.5 kg    LMP 03/17/2015    SpO2 98%    BMI 26.45 kg/m   Physical Exam Vitals signs and nursing note reviewed.  Constitutional:      General: She is not in acute distress.    Appearance: She is well-developed.  HENT:     Head: Normocephalic and atraumatic.     Mouth/Throat:     Mouth: Mucous membranes are dry.  Eyes:     Conjunctiva/sclera: Conjunctivae normal.  Neck:     Musculoskeletal: Neck supple.  Cardiovascular:     Rate and Rhythm: Normal rate and regular rhythm.     Heart sounds: Normal heart sounds. No murmur.  Pulmonary:     Effort: Pulmonary effort is normal. No respiratory distress.     Breath sounds: Normal breath sounds. No wheezing, rhonchi or rales.  Abdominal:     General: Bowel sounds are normal.     Palpations: Abdomen is soft.     Tenderness: There is no abdominal tenderness. There is no guarding or rebound.  Skin:    General: Skin is warm and dry.  Neurological:     Mental Status: She is alert.     Comments: Mental Status:  Alert, thought content appropriate, able to give a coherent history. Speech fluent without evidence of aphasia. Able to follow 2 step  commands without difficulty.  Cranial Nerves:  II:  pupils equal, round, reactive to light III,IV, VI: ptosis not present, extra-ocular motions intact bilaterally  V,VII: smile symmetric, facial light  touch sensation equal VIII: hearing grossly normal to voice  X: uvula elevates symmetrically  XI: bilateral shoulder shrug symmetric and strong XII: midline tongue extension without fassiculations Motor:  Normal tone. 5/5 strength of BUE and BLE major muscle groups including strong and equal grip strength and dorsiflexion/plantar flexion Sensory: light touch normal in all extremities.     ED Treatments / Results  Labs (all labs ordered are listed, but only abnormal results are displayed) Labs Reviewed  BASIC METABOLIC PANEL - Abnormal; Notable for the following components:      Result Value   Sodium 132 (*)    Glucose, Bld 516 (*)    Calcium 8.7 (*)    GFR calc non Af Amer 60 (*)    All other components within normal limits  CBC - Abnormal; Notable for the following components:   WBC 3.7 (*)    All other components within normal limits  URINALYSIS, ROUTINE W REFLEX MICROSCOPIC - Abnormal; Notable for the following components:   Color, Urine STRAW (*)    Glucose, UA >=500 (*)    All other components within normal limits  HEPATIC FUNCTION PANEL - Abnormal; Notable for the following components:   AST 43 (*)    Bilirubin, Direct 0.4 (*)    All other components within normal limits  CBG MONITORING, ED - Abnormal; Notable for the following components:   Glucose-Capillary 514 (*)    All other components within normal limits  CBG MONITORING, ED - Abnormal; Notable for the following components:   Glucose-Capillary 417 (*)    All other components within normal limits  BLOOD GAS, VENOUS    EKG None  Radiology No results found.  Procedures Procedures (including critical care time)  Medications Ordered in ED Medications  sodium chloride 0.9 % bolus 1,000 mL (0 mLs Intravenous Stopped 09/19/18 2249)  sodium chloride 0.9 % bolus 1,000 mL (1,000 mLs Intravenous New Bag/Given 09/20/18 0015)  insulin aspart (novoLOG) injection 5 Units (5 Units Subcutaneous Given 09/20/18 0015)      Initial Impression / Assessment and Plan / ED Course  I have reviewed the triage vital signs and the nursing notes.  Pertinent labs & imaging results that were available during my care of the patient were reviewed by me and considered in my medical decision making (see chart for details).     Final Clinical Impressions(s) / ED Diagnoses   Final diagnoses:  Hyperglycemia   73 year old female with history of memory issues presenting for evaluation of hyperglycemia.  On exam, patient well-appearing.  No acute distress.  Blood pressure is elevated initially, received BP meds just prior to arrival.  She denies headache chest pain or shortness of breath.  Remainder of her vital signs are within normal limits.  CBC with mild leukopenia BMP with hyponatremia, elevated blood glucose.  Normal bicarb.  No elevated anion gap. Liver enzymes reassuring. VBG without acidosis UA with glucosuria, new ketonuria or evidence of UTI.  Patient with hyperglycemia without evidence of DKA.  She is given a liter of fluids and blood sugars improved to 417.  Will give additional fluids and subcu insulin and reassess blood glucose.  If glucose is improving, suspect she is safe for discharge to her facility.  She will need to follow-up with PCP for further titration of her diabetes medications.  She will need to return if worse.  Had a long discussion with patient's daughter at bedside about the plan.  She voices understanding and is in agreement.  At shift change, care transitioned to Kathryn Carnes, PA-C with plan to follow-up on repeat CBG after fluids and insulin.  ED Discharge Orders    None       Bishop Dublin 09/20/18 0114    Varney Biles, MD 09/26/18 1326

## 2018-09-19 NOTE — Telephone Encounter (Signed)
I already completed a form to change for Lantus. Thanks, BJ

## 2018-09-19 NOTE — ED Notes (Signed)
[facility] here for transport back to facility.  

## 2018-09-19 NOTE — Telephone Encounter (Signed)
Pt's daughter stated insurance will not cover Insulin Glargine (BASAGLAR KWIKPEN) 100 UNIT/ML SOPN They told her to have Dr. Martinique try Lispro. Also stated pt went to the hospital last night due to her sugar being 700. Please advise.

## 2018-09-19 NOTE — ED Notes (Signed)
Critical result for glucose of 516. Primary RN made aware.

## 2018-09-20 ENCOUNTER — Emergency Department (HOSPITAL_COMMUNITY)
Admission: EM | Admit: 2018-09-20 | Discharge: 2018-09-21 | Disposition: A | Discharge status: Home / Self Care | Payer: Medicare Other | Attending: Emergency Medicine | Admitting: Emergency Medicine

## 2018-09-20 ENCOUNTER — Emergency Department (HOSPITAL_COMMUNITY): Payer: Medicare Other

## 2018-09-20 ENCOUNTER — Encounter (HOSPITAL_COMMUNITY): Payer: Self-pay

## 2018-09-20 ENCOUNTER — Other Ambulatory Visit: Payer: Self-pay | Admitting: Family Medicine

## 2018-09-20 ENCOUNTER — Other Ambulatory Visit: Payer: Self-pay

## 2018-09-20 ENCOUNTER — Ambulatory Visit: Payer: Self-pay | Admitting: *Deleted

## 2018-09-20 DIAGNOSIS — E1165 Type 2 diabetes mellitus with hyperglycemia: Secondary | ICD-10-CM | POA: Insufficient documentation

## 2018-09-20 DIAGNOSIS — I2510 Atherosclerotic heart disease of native coronary artery without angina pectoris: Secondary | ICD-10-CM | POA: Diagnosis not present

## 2018-09-20 DIAGNOSIS — R279 Unspecified lack of coordination: Secondary | ICD-10-CM | POA: Diagnosis not present

## 2018-09-20 DIAGNOSIS — Z880 Allergy status to penicillin: Secondary | ICD-10-CM | POA: Insufficient documentation

## 2018-09-20 DIAGNOSIS — R4020 Unspecified coma: Secondary | ICD-10-CM | POA: Diagnosis not present

## 2018-09-20 DIAGNOSIS — R4182 Altered mental status, unspecified: Secondary | ICD-10-CM | POA: Diagnosis not present

## 2018-09-20 DIAGNOSIS — Z87891 Personal history of nicotine dependence: Secondary | ICD-10-CM | POA: Insufficient documentation

## 2018-09-20 DIAGNOSIS — I252 Old myocardial infarction: Secondary | ICD-10-CM | POA: Diagnosis not present

## 2018-09-20 DIAGNOSIS — Z743 Need for continuous supervision: Secondary | ICD-10-CM | POA: Diagnosis not present

## 2018-09-20 DIAGNOSIS — Z79899 Other long term (current) drug therapy: Secondary | ICD-10-CM | POA: Insufficient documentation

## 2018-09-20 DIAGNOSIS — Z887 Allergy status to serum and vaccine status: Secondary | ICD-10-CM | POA: Insufficient documentation

## 2018-09-20 DIAGNOSIS — R739 Hyperglycemia, unspecified: Secondary | ICD-10-CM

## 2018-09-20 DIAGNOSIS — Z794 Long term (current) use of insulin: Secondary | ICD-10-CM | POA: Diagnosis not present

## 2018-09-20 DIAGNOSIS — E11649 Type 2 diabetes mellitus with hypoglycemia without coma: Secondary | ICD-10-CM | POA: Diagnosis not present

## 2018-09-20 DIAGNOSIS — E782 Mixed hyperlipidemia: Secondary | ICD-10-CM | POA: Insufficient documentation

## 2018-09-20 DIAGNOSIS — J449 Chronic obstructive pulmonary disease, unspecified: Secondary | ICD-10-CM | POA: Diagnosis not present

## 2018-09-20 DIAGNOSIS — I10 Essential (primary) hypertension: Secondary | ICD-10-CM | POA: Diagnosis not present

## 2018-09-20 DIAGNOSIS — Z7982 Long term (current) use of aspirin: Secondary | ICD-10-CM | POA: Diagnosis not present

## 2018-09-20 DIAGNOSIS — F0390 Unspecified dementia without behavioral disturbance: Secondary | ICD-10-CM | POA: Insufficient documentation

## 2018-09-20 DIAGNOSIS — Z881 Allergy status to other antibiotic agents status: Secondary | ICD-10-CM | POA: Diagnosis not present

## 2018-09-20 LAB — I-STAT CHEM 8, ED
BUN: 6 mg/dL — ABNORMAL LOW (ref 8–23)
Calcium, Ion: 1.29 mmol/L (ref 1.15–1.40)
Chloride: 101 mmol/L (ref 98–111)
Creatinine, Ser: 0.6 mg/dL (ref 0.44–1.00)
Glucose, Bld: 327 mg/dL — ABNORMAL HIGH (ref 70–99)
HCT: 35 % — ABNORMAL LOW (ref 36.0–46.0)
Hemoglobin: 11.9 g/dL — ABNORMAL LOW (ref 12.0–15.0)
Potassium: 4.1 mmol/L (ref 3.5–5.1)
Sodium: 136 mmol/L (ref 135–145)
TCO2: 25 mmol/L (ref 22–32)

## 2018-09-20 LAB — BLOOD GAS, VENOUS
Acid-Base Excess: 0.9 mmol/L (ref 0.0–2.0)
Bicarbonate: 27.1 mmol/L (ref 20.0–28.0)
O2 Saturation: 27.1 %
Patient temperature: 98.2
pCO2, Ven: 51.1 mmHg (ref 44.0–60.0)
pH, Ven: 7.343 (ref 7.250–7.430)

## 2018-09-20 LAB — CBC
HCT: 37.6 % (ref 36.0–46.0)
Hemoglobin: 12.3 g/dL (ref 12.0–15.0)
MCH: 28.7 pg (ref 26.0–34.0)
MCHC: 32.7 g/dL (ref 30.0–36.0)
MCV: 87.6 fL (ref 80.0–100.0)
Platelets: 181 10*3/uL (ref 150–400)
RBC: 4.29 MIL/uL (ref 3.87–5.11)
RDW: 13.7 % (ref 11.5–15.5)
WBC: 3.9 10*3/uL — ABNORMAL LOW (ref 4.0–10.5)
nRBC: 0 % (ref 0.0–0.2)

## 2018-09-20 LAB — BASIC METABOLIC PANEL
Anion gap: 9 (ref 5–15)
BUN: 7 mg/dL — ABNORMAL LOW (ref 8–23)
CO2: 24 mmol/L (ref 22–32)
Calcium: 9.5 mg/dL (ref 8.9–10.3)
Chloride: 103 mmol/L (ref 98–111)
Creatinine, Ser: 0.78 mg/dL (ref 0.44–1.00)
GFR calc Af Amer: >60 mL/min (ref >60)
GFR calc non Af Amer: >60 mL/min (ref >60)
Glucose, Bld: 346 mg/dL — ABNORMAL HIGH (ref 70–99)
Potassium: 4.4 mmol/L (ref 3.5–5.1)
Sodium: 136 mmol/L (ref 135–145)

## 2018-09-20 LAB — PHENYTOIN LEVEL, TOTAL: Phenytoin Lvl: 12.6 ug/mL (ref 10.0–20.0)

## 2018-09-20 LAB — CBG MONITORING, ED
Glucose-Capillary: 212 mg/dL — ABNORMAL HIGH (ref 70–99)
Glucose-Capillary: 256 mg/dL — ABNORMAL HIGH (ref 70–99)
Glucose-Capillary: 322 mg/dL — ABNORMAL HIGH (ref 70–99)

## 2018-09-20 MED ORDER — AMLODIPINE BESYLATE 5 MG PO TABS: 10.0000 mg | ORAL_TABLET | Freq: Once | ORAL | Status: DC

## 2018-09-20 MED ORDER — METOPROLOL TARTRATE 25 MG PO TABS
50.0000 mg | ORAL_TABLET | Freq: Once | ORAL | Status: AC
  Administered 2018-09-20: 02:00:00 50 mg via ORAL
  Filled 2018-09-20: qty 2

## 2018-09-20 MED ORDER — INSULIN ASPART 100 UNIT/ML ~~LOC~~ SOLN
5.0000 [IU] | Freq: Once | SUBCUTANEOUS | Status: AC
  Administered 2018-09-20: 5 [IU] via SUBCUTANEOUS
  Filled 2018-09-20: qty 0.05

## 2018-09-20 MED ORDER — AMLODIPINE BESYLATE 10 MG PO TABS: 10.0000 mg | ORAL_TABLET | Freq: Every day | ORAL | 0 refills | Status: DC

## 2018-09-20 MED ORDER — NOVOLOG FLEXPEN 100 UNIT/ML ~~LOC~~ SOPN: PEN_INJECTOR | SUBCUTANEOUS | 2 refills | Status: DC

## 2018-09-20 MED ORDER — ONDANSETRON HCL 4 MG/2ML IJ SOLN
4.0000 mg | Freq: Once | INTRAMUSCULAR | Status: AC
  Administered 2018-09-20: 4 mg via INTRAVENOUS
  Filled 2018-09-20: qty 2

## 2018-09-20 MED ORDER — HYDRALAZINE HCL 20 MG/ML IJ SOLN
10.0000 mg | Freq: Once | INTRAMUSCULAR | Status: AC
  Administered 2018-09-20: 10 mg via INTRAVENOUS
  Filled 2018-09-20: qty 1

## 2018-09-20 MED ORDER — SODIUM CHLORIDE 0.9 % IV BOLUS
1000.0000 mL | Freq: Once | INTRAVENOUS | Status: AC
  Administered 2018-09-20: 1000 mL via INTRAVENOUS

## 2018-09-20 MED ORDER — LANTUS SOLOSTAR 100 UNIT/ML ~~LOC~~ SOPN: 15.0000 [IU] | PEN_INJECTOR | Freq: Every day | SUBCUTANEOUS | 1 refills | Status: DC

## 2018-09-20 NOTE — ED Notes (Signed)
Pt and daughter verbalized discharge instructions and follow up care. No further questions. Alert and no acute distress. No iv. [facility] taking pt home

## 2018-09-20 NOTE — Discharge Instructions (Addendum)
Patient will need to adhere to a low carb diet.  Her blood sugars will need to be checked at least twice daily.  Please have her follow-up with her primary care doctor for further titration of her insulin.  Return to the emergency department for new or worsening symptoms.

## 2018-09-20 NOTE — Telephone Encounter (Signed)
Dr. Martinique - Please advise. Thank you!

## 2018-09-20 NOTE — ED Notes (Signed)
[facility] contacted and paperwork printed

## 2018-09-20 NOTE — ED Triage Notes (Signed)
Pt seen 2 days ago and yesterday for hyperglycemia. EMS says "shes taking her insulin but it is just not working". Pt cant see PCP until Friday. Patient has hx of dementia, and is currently at baseline. No complaints of pain, last CBG was "180 ish" last taken 1716.

## 2018-09-20 NOTE — ED Notes (Signed)
Took pt BP in right arm and it was 200/78. Retook BP in left arm and was 210/102.

## 2018-09-20 NOTE — ED Provider Notes (Signed)
Assumed care from [place] at shift change.  See prior notes for full H&P.  Briefly, 73 year old female sent to the ED from nursing facility for the second consecutive day due to hyperglycemia.  Glucose here was 516.  She has received IV fluids.  No evidence of DKA with normal bicarb, anion gap, and blood gas.    Plan:  She is receiving second liter of fluids and small bolus of insulin.  We will plan to recheck CBG and once within reasonable range can discharge back to facility.  2:15 AM IVF infusing.  Notified that patient's BP is 212/91.  Her pressures have been elevated here but not [id number] systolic.  Unsure if she received her night time dose of metoprolol from facility, no MAR was sent with patient.  Will give dose of that here.  3:56 AM CBG 212.  BP has decreased after metoprolol, now 195/91 which is about the same as when she arrived.  She is asymptomatic of her BP.  Will need this monitored with PCP.  Feel she is stable for discharge back to facility.  They will need to continue low carb diet and monitoring sugars closely.  Return here for any new/acute changes.   Larene Pickett, PA-C 09/20/18 0401    Merryl Hacker, MD 09/20/18 [phone]

## 2018-09-20 NOTE — ED Notes (Signed)
[facility] at bedside. All belongings taken with them.

## 2018-09-20 NOTE — ED Provider Notes (Signed)
[facility] DEPT Provider Note   CSN: [id number] Arrival date & time: 09/20/18  2027     History   Chief Complaint Chief Complaint  Patient presents with   Hyperglycemia    HPI Kathryn Lewis is a 73 y.o. female history of bipolar, diabetes, hypertension, dementia here presenting with hyperglycemia, uncontrolled blood pressure.  This is her third ED visit over the last 3 days.  Patient came here for hypertension hyperglycemia last several days.  Patient is DNR and is from a facility.  Patient's blood sugar remains elevated in the facility called her doctor and her insulin was increased to 15 units from 10 units.  There was no adjustment for her blood pressure medicines however.  She is currently on Cozaar and metoprolol and was given this morning.  Patient demented and unable to give me much history.     The history is provided by the patient.  Level V caveat- dementia   Past Medical History:  Diagnosis Date   Allergy    Arthritis    Bipolar 1 disorder ([place])    Blood transfusion without reported diagnosis    Carotid artery stenosis    50-69% bilateral ICA stenoses by velocity criteria but no visible plaque and ICA/CCA ratio 1.72 on right and 1.38 on left   Coronary artery disease    Depression    Diabetes mellitus without complication (HCC)    Echocardiogram    Echo 06/2018: Hyperdynamic systolic function, EF 99991111, mild concentric LVH, elevated LVEDP (E/E' suggests impaired relaxation), mild AI, lipomatous interatrial septum   Hypertension    Left adrenal mass (HCC)    2.5 cm L adrenal mass on CT per note of Dr. Seleta Rhymes 05/20/2014   MI, old    PAD (peripheral artery disease) ([place])    03/2014 - CT aortogram with lower extremity runoff (mild to moderate disease in common iliac arteries, complete occulusion of her bilateral external iliac arteries with heavily disease common femoral arteries. She also has disease to her SFAs bilaterally.  Popliteal arteries and tibial vessel runoff appears to be adequate)   Seizures ([place])    Stroke [facility])     Patient Active Problem List   Diagnosis Date Noted   PAD (peripheral artery disease) ([place]) 04/05/2018   Carotid artery disease ([place]) 04/05/2018   Hypokalemia 02/27/2018   Frequent falls 07/08/2017   Back pain 07/08/2017   Mixed hyperlipidemia 05/27/2017   Unstable gait 01/10/2017   Hyperlipidemia associated with type 2 diabetes mellitus ([place]) 01/10/2017   OSA on CPAP 12/24/2016   COPD (chronic obstructive pulmonary disease) ([place]) 12/24/2016   Pulmonary nodule 12/24/2016   Generalized weakness 12/12/2015   Diabetes mellitus type 2 with neurological manifestations ([place]) 12/12/2015   Essential hypertension 12/12/2015   Seizure disorder as sequela of cerebrovascular accident ([place]) 04/17/2015   CAD (coronary artery disease) 04/17/2015    Past Surgical History:  Procedure Laterality Date   BREAST SURGERY  2012   small lump nonmilignant   NECK SURGERY       OB History   No obstetric history on file.      Home Medications    Prior to Admission medications   Medication Sig Start Date End Date Taking? Authorizing Provider  amLODipine (NORVASC) 10 MG tablet Take 1 tablet (10 mg total) by mouth daily. 01/10/17  Yes Martinique, Betty G, MD  aspirin EC 81 MG tablet Take 81 mg by mouth daily.   Yes [provider]  Cholecalciferol (VITAMIN D3)  50 MCG (2000 UT) capsule Take 2,000 Units by mouth daily.    Yes [provider]  clopidogrel (PLAVIX) 75 MG tablet Take 1 tablet (75 mg total) by mouth daily. 01/10/17  Yes Martinique, Betty G, MD  ezetimibe (ZETIA) 10 MG tablet Take 1 tablet (10 mg total) by mouth daily. 06/22/17  Yes Martinique, Betty G, MD  ferrous sulfate 325 (65 FE) MG tablet Take 650 mg by mouth daily.    Yes [provider]  fluticasone (FLONASE) 50 MCG/ACT nasal spray Place 2 sprays into both nostrils daily. 04/22/17  Yes Nafziger,  Tommi Rumps, NP  Insulin Glargine (LANTUS SOLOSTAR) 100 UNIT/ML Solostar Pen Inject 15 Units into the skin at bedtime. Patient taking differently: Inject 10 Units into the skin at bedtime.  09/20/18  Yes Martinique, Betty G, MD  isosorbide mononitrate (IMDUR) 30 MG 24 hr tablet Take 30 mg by mouth daily.    Yes [provider]  lamoTRIgine (LAMICTAL) 200 MG tablet Take 1.5 tablets (300 mg total) by mouth See admin instructions. Take 1 1/2 tablets (300 mg) by mouth twice daily - 8am and 2pm 03/13/18  Yes Cameron Sprang, MD  losartan (COZAAR) 25 MG tablet Take 1 tablet (25 mg total) by mouth daily. 09/11/18  Yes Martinique, Betty G, MD  metoprolol tartrate (LOPRESSOR) 50 MG tablet Take 1 tablet (50 mg total) by mouth 2 (two) times daily. 09/11/18  Yes Martinique, Betty G, MD  Omega-3 Fatty Acids (FISH OIL) 1000 MG CAPS Take 2,000 mg by mouth daily.   Yes [provider]  phenytoin (DILANTIN) 100 MG ER capsule T 3 CAPS (300MG ) PO HS ND Patient taking differently: Take 300 mg by mouth at bedtime.  03/13/18  Yes Cameron Sprang, MD  phenytoin (DILANTIN) 30 MG ER capsule Take 1 capsule (30 mg total) by mouth at bedtime. 03/13/18  Yes Cameron Sprang, MD  rosuvastatin (CRESTOR) 40 MG tablet Take 40 mg by mouth daily.   Yes [provider]  spironolactone (ALDACTONE) 50 MG tablet Take 1 tablet (50 mg total) by mouth daily. 05/27/17  Yes Fay Records, MD  tiotropium (SPIRIVA HANDIHALER) 18 MCG inhalation capsule Place 1 capsule (18 mcg total) into inhaler and inhale daily. 06/21/18 09/20/18 Yes Martinique, Betty G, MD  albuterol (PROVENTIL HFA;VENTOLIN HFA) 108 (90 Base) MCG/ACT inhaler Inhale 2 puffs into the lungs every 6 (six) hours as needed for wheezing or shortness of breath. Patient not taking: Reported on 09/20/2018 03/21/18   Martinique, Betty G, MD  budesonide-formoterol [facility]) 160-4.5 MCG/ACT inhaler Inhale 2 puffs into the lungs 2 (two) times daily. Patient not taking: Reported on 09/19/2018 03/22/18    [place]ique, Betty G, MD  furosemide (LASIX) 20 MG tablet Take 1 tablet (20 mg total) by mouth daily as needed for edema. Patient not taking: Reported on 09/20/2018 03/21/18   Martinique, Betty G, MD  haloperidol (HALDOL) 0.5 MG tablet Take 1 tablet (0.5 mg total) by mouth 2 (two) times daily as needed for agitation. Patient not taking: Reported on 09/19/2018 04/24/18   [place], Betty G, MD  insulin aspart (NOVOLOG FLEXPEN) 100 UNIT/ML FlexPen Sliding scale: BS<150 no insulin, 151-180 give [id number] give 6U,221-260 give 8U,261-300 give 10U,301-340 give 12 U,and > 341 give 14 u. 09/20/18   Martinique, Betty G, MD  Spacer/Aero Chamber Mouthpiece MISC 1 Device by Does not apply route daily as needed. 12/24/16   Rigoberto Noel, MD  tiZANidine (ZANAFLEX) 4 MG tablet Take 1 tablet (4 mg total)  by mouth every 8 (eight) hours as needed for muscle spasms. Patient not taking: Reported on 09/19/2018 07/08/17   Martinique, Betty G, MD    Family History Family History  Problem Relation Age of Onset   Hypertension Mother    Arthritis Mother    Diabetes Mother    Early death Mother    Hyperlipidemia Mother    Stroke Mother    Hypertension Father    Alcohol abuse Father    Early death Father    Hyperlipidemia Father    COPD Father    Heart attack Father    Hypertension Sister    Birth defects Brother    Depression Brother    Hyperlipidemia Brother    Hypertension Brother    Arthritis Daughter    Asthma Daughter    COPD Daughter    Arthritis Son    COPD Son    Hypertension Son    Hyperlipidemia Son    Heart attack Son    Arthritis Maternal Grandmother    Cancer Maternal Grandmother     Social History Social History   Tobacco Use   Smoking status: Former Smoker    Packs/day: 1.00    Types: Cigarettes    Quit date: 2015    Years since quitting: 5.6   Smokeless tobacco: Never Used  Substance Use Topics   Alcohol use: No   Drug use: No     Allergies   Influenza vaccines,  Amoxicillin, Augmentin [amoxicillin-pot clavulanate], Cephalexin, Erythromycin, Niacin and related, Cefadroxil, and Nitrofurantoin macrocrystal   Review of Systems Review of Systems  Neurological: Positive for weakness.  All other systems reviewed and are negative.    Physical Exam Updated Vital Signs BP 132/80    Pulse 70    Temp 98.2 F (36.8 C) (Oral)    Resp 18    Ht 5' (1.524 m)    Wt 63.5 kg    LMP 03/17/2015    SpO2 100%    BMI 27.34 kg/m   Physical Exam Vitals signs and nursing note reviewed.  HENT:     Head: Normocephalic.     Nose: Nose normal.     Mouth/Throat:     Mouth: Mucous membranes are moist.  Eyes:     Extraocular Movements: Extraocular movements intact.     Pupils: Pupils are equal, round, and reactive to light.  Neck:     Musculoskeletal: Normal range of motion.  Cardiovascular:     Rate and Rhythm: Normal rate and regular rhythm.     Pulses: Normal pulses.     Heart sounds: Normal heart sounds.  Pulmonary:     Effort: Pulmonary effort is normal.     Breath sounds: Normal breath sounds.  Abdominal:     General: Abdomen is flat.     Palpations: Abdomen is soft.  Musculoskeletal: Normal range of motion.  Skin:    General: Skin is warm.     Capillary Refill: Capillary refill takes less than 2 seconds.  Neurological:     General: No focal deficit present.     Mental Status: She is alert.     Comments: Demented, A & O x 2. Strength 5/5 bilaterally   Psychiatric:        Mood and Affect: Mood normal.        Behavior: Behavior normal.      ED Treatments / Results  Labs (all labs ordered are listed, but only abnormal results are displayed) Labs Reviewed  BASIC METABOLIC PANEL -  Abnormal; Notable for the following components:      Result Value   Glucose, Bld 346 (*)    BUN 7 (*)    All other components within normal limits  CBC - Abnormal; Notable for the following components:   WBC 3.9 (*)    All other components within normal limits  CBG  MONITORING, ED - Abnormal; Notable for the following components:   Glucose-Capillary 322 (*)    All other components within normal limits  I-STAT CHEM 8, ED - Abnormal; Notable for the following components:   BUN 6 (*)    Glucose, Bld 327 (*)    Hemoglobin 11.9 (*)    HCT 35.0 (*)    All other components within normal limits  BLOOD GAS, VENOUS  PHENYTOIN LEVEL, TOTAL  URINALYSIS, ROUTINE W REFLEX MICROSCOPIC    EKG None  Radiology Dg Chest Port 1 View  Result Date: 09/20/2018 CLINICAL DATA:  Altered mental status.  Hyperglycemia. EXAM: PORTABLE CHEST 1 VIEW COMPARISON:  03/21/2018 FINDINGS: Grossly unchanged enlarged cardiac silhouette and mediastinal contours. Overall improved aeration of lungs with persistent mild diffuse slightly nodular thickening of the pulmonary interstitium. No discrete focal airspace opacities. No pleural effusion or pneumothorax. No evidence of edema. No acute osseous abnormalities. Post lower cervical ACDF, incompletely evaluated. IMPRESSION: Improved aeration of lungs without superimposed acute cardiopulmonary disease on this AP portable examination. Electronically Signed   By: Sandi Mariscal M.D.   On: 09/20/2018 21:43    Procedures Procedures (including critical care time)  Angiocath insertion Performed by: Wandra Arthurs  Consent: Verbal consent obtained. Risks and benefits: risks, benefits and alternatives were discussed Time out: Immediately prior to procedure a "time out" was called to verify the correct patient, procedure, equipment, support staff and site/side marked as required.  Preparation: Patient was prepped and draped in the usual sterile fashion.  Vein Location: R antecube  Ultrasound Guided  Gauge: 20 long   Normal blood return and flush without difficulty Patient tolerance: Patient tolerated the procedure well with no immediate complications.     Medications Ordered in ED Medications  amLODipine (NORVASC) tablet 10 mg (has no  administration in time range)  sodium chloride 0.9 % bolus 1,000 mL (1,000 mLs Intravenous New Bag/Given 09/20/18 2207)  hydrALAZINE (APRESOLINE) injection 10 mg (10 mg Intravenous Given 09/20/18 2203)  insulin aspart (novoLOG) injection 5 Units (5 Units Subcutaneous Given 09/20/18 2202)  ondansetron (ZOFRAN) injection 4 mg (4 mg Intravenous Given 09/20/18 2203)     Initial Impression / Assessment and Plan / ED Course  I have reviewed the triage vital signs and the nursing notes.  Pertinent labs & imaging results that were available during my care of the patient were reviewed by me and considered in my medical decision making (see chart for details).       Kathryn Lewis is a 73 y.o. female here presenting with hyperglycemia, hypertension.  Patient's blood pressure was 200/100 on arrival. Patient demented but has nonfocal neuro exam. Patient had multiple ED visits recently with same complaints and PCP increased her lantus but she hasn't received it yet. Will get CT head to r/o bleed given her hypertension. Will check chemistry and give insulin and hydrate patient.   11:17 PM Labs showed glucose 320. Nl AG. VBG reassuring. CT head pending. BP down to 150s now. Signed out to Dr. Betsey Holiday to recheck blood sugar. Will prescribe norvasc 10 mg that she was on previously to help with hypertension. She can follow  up with PCP. Recommend low carb diet.    Final Clinical Impressions(s) / ED Diagnoses   Final diagnoses:  None    ED Discharge Orders    None       Drenda Freeze, MD 09/20/18 2318

## 2018-09-20 NOTE — ED Notes (Signed)
PA aware of trending blood pressures

## 2018-09-20 NOTE — Telephone Encounter (Signed)
Daughter is calling with concerns about glucose levels being. Patient has been in hospital ED for last 2 nights- with elevated glucose levels. Patient was given fluids, treated with insulin and  Her BP was treated. Patient glucose today is - 242- fasting am, 534 after lunch. (not sure what patient ate) Patient is under palliative care for multiple problems.daughter is concerned because she believes the glucose level is not being monitored closely enough- per daughter am/pm only- she had to insist they check it when she spoke to them at lunch time. They could not report if her mother had eaten her full lunch or partial lunch.   [facility] is resident- [facility]- and with COVID the daughter is not allowed to bring her to appointment. [phone] or [phone].  (Daughter request call back)

## 2018-09-20 NOTE — Discharge Instructions (Signed)
Your insulin was increased to 15 units by your doctor. Please take as prescribe   You need to have no sweets or sodas. Low carb diet please   Continue current blood pressure meds.   Add norvasc 10 mg daily   See your doctor for follow up in 2-3 days   Return to ER if she has lethargy, weakness, trouble speaking, glucose > 500

## 2018-09-20 NOTE — Telephone Encounter (Signed)
Lantus was started recently. Lantus can be increased from 10 mg to 15 U and we are adding Novolog to manage elevated BS during the day,sliding scale.  If BS > 400 + MS changes,she needs to go to the ER. Rx will be sent. Thanks, BJ

## 2018-09-21 ENCOUNTER — Encounter: Payer: Self-pay | Admitting: Family Medicine

## 2018-09-21 ENCOUNTER — Telehealth (INDEPENDENT_AMBULATORY_CARE_PROVIDER_SITE_OTHER): Payer: Medicare Other | Admitting: Family Medicine

## 2018-09-21 DIAGNOSIS — I10 Essential (primary) hypertension: Secondary | ICD-10-CM

## 2018-09-21 DIAGNOSIS — R739 Hyperglycemia, unspecified: Secondary | ICD-10-CM

## 2018-09-21 DIAGNOSIS — E1159 Type 2 diabetes mellitus with other circulatory complications: Secondary | ICD-10-CM | POA: Diagnosis not present

## 2018-09-21 DIAGNOSIS — I160 Hypertensive urgency: Secondary | ICD-10-CM

## 2018-09-21 DIAGNOSIS — I6523 Occlusion and stenosis of bilateral carotid arteries: Secondary | ICD-10-CM | POA: Diagnosis not present

## 2018-09-21 DIAGNOSIS — Z743 Need for continuous supervision: Secondary | ICD-10-CM | POA: Diagnosis not present

## 2018-09-21 DIAGNOSIS — R279 Unspecified lack of coordination: Secondary | ICD-10-CM | POA: Diagnosis not present

## 2018-09-21 DIAGNOSIS — I152 Hypertension secondary to endocrine disorders: Secondary | ICD-10-CM

## 2018-09-21 DIAGNOSIS — E1165 Type 2 diabetes mellitus with hyperglycemia: Secondary | ICD-10-CM | POA: Diagnosis not present

## 2018-09-21 LAB — URINALYSIS, ROUTINE W REFLEX MICROSCOPIC
Bilirubin Urine: NEGATIVE
Glucose, UA: >=500 mg/dL — AB
Hgb urine dipstick: NEGATIVE
Ketones, ur: NEGATIVE mg/dL
Leukocytes,Ua: NEGATIVE
Nitrite: NEGATIVE
Protein, ur: NEGATIVE mg/dL
Specific Gravity, Urine: 1.002 — ABNORMAL LOW (ref 1.005–1.030)
pH: 8 (ref 5.0–8.0)

## 2018-09-21 NOTE — ED Provider Notes (Signed)
Signed out by Dr. Darl Householder to follow-up on CT scan and urinalysis.  Patient with hypertension secondary to having blood pressure medications stopped and recurrent hyperglycemia.  Daughter reports that she has been having unrestricted access to sweets and other foods at the nursing home which is likely the cause of the hypoglycemia.  No evidence of DKA.  CT head does not show evidence of acute stroke.  Urinalysis does not suggest infection.  Will discharge back to nursing home where primary care can manage blood pressure and diabetes.  Will restart previous dose of Norvasc.   Orpah Greek, MD 09/21/18 [phone]

## 2018-09-21 NOTE — ED Notes (Signed)
PTAR at bedside 

## 2018-09-21 NOTE — Progress Notes (Signed)
Virtual Visit via Video Note  I connected with Kathryn Lewis and [name] (caregiver)  on 09/21/18 at 11:00 AM EDT by a video enabled telemedicine application and verified that I am speaking with the correct person using two identifiers.  Location patient: home Location provider:work or home office  I discussed the limitations of evaluation and management by telemedicine and the availability of in person appointments. The patient expressed understanding and agreed to proceed.   HPI:  Acute visit for follow up of elevated blood sugar and elevated blood pressure. She went to the emergency room yesterday for this. Her PCP had just increased her lantus to 15unit from 10 and sent a sliding scale insulin to use as well on 8/26. They had not yet gotten this from the pharmacy, they plan to do so today per Helene Kelp and will start tonight. The patient eats a lot of sweets, however, the caregiver reports the family has said to let the patient have whatever she wants. Caregiver reports the patient did take the Norvasc today that the ER prescribed. BP today 157/74. Blood sugar today is improved - down from 300 to 200 this morning per the care giver. Caregiver reports patient is doing well today without any change in mental status, fevers or difficulty breathing or other concerns.   ROS: See pertinent positives and negatives per HPI.  Past Medical History:  Diagnosis Date  . Allergy   . Arthritis   . Bipolar 1 disorder ([place])   . Blood transfusion without reported diagnosis   . Carotid artery stenosis    50-69% bilateral ICA stenoses by velocity criteria but no visible plaque and ICA/CCA ratio 1.72 on right and 1.38 on left  . Coronary artery disease   . Depression   . Diabetes mellitus without complication ([place])   . Echocardiogram    Echo 06/2018: Hyperdynamic systolic function, EF 99991111, mild concentric LVH, elevated LVEDP (E/E' suggests impaired relaxation), mild AI, lipomatous interatrial septum  .  Hypertension   . Left adrenal mass (HCC)    2.5 cm L adrenal mass on CT per note of Dr. Seleta Rhymes 05/20/2014  . MI, old   . PAD (peripheral artery disease) ([place])    03/2014 - CT aortogram with lower extremity runoff (mild to moderate disease in common iliac arteries, complete occulusion of her bilateral external iliac arteries with heavily disease common femoral arteries. She also has disease to her SFAs bilaterally. Popliteal arteries and tibial vessel runoff appears to be adequate)  . Seizures ([place])   . Stroke [facility])     Past Surgical History:  Procedure Laterality Date  . BREAST SURGERY  2012   small lump nonmilignant  . NECK SURGERY      Family History  Problem Relation Age of Onset  . Hypertension Mother   . Arthritis Mother   . Diabetes Mother   . Early death Mother   . Hyperlipidemia Mother   . Stroke Mother   . Hypertension Father   . Alcohol abuse Father   . Early death Father   . Hyperlipidemia Father   . COPD Father   . Heart attack Father   . Hypertension Sister   . Birth defects Brother   . Depression Brother   . Hyperlipidemia Brother   . Hypertension Brother   . Arthritis Daughter   . Asthma Daughter   . COPD Daughter   . Arthritis Son   . COPD Son   . Hypertension Son   . Hyperlipidemia Son   .  Heart attack Son   . Arthritis Maternal Grandmother   . Cancer Maternal Grandmother     SOCIAL HX: see hpi   Current Outpatient Medications:  .  acetaminophen (TYLENOL) 500 MG tablet, Take 500 mg by mouth 4 (four) times daily as needed., Disp: , Rfl:  .  albuterol (PROVENTIL HFA;VENTOLIN HFA) 108 (90 Base) MCG/ACT inhaler, Inhale 2 puffs into the lungs every 6 (six) hours as needed for wheezing or shortness of breath., Disp: 1 Inhaler, Rfl: 6 .  amLODipine (NORVASC) 10 MG tablet, Take 1 tablet (10 mg total) by mouth daily., Disp: 30 tablet, Rfl: 0 .  aspirin EC 81 MG tablet, Take 81 mg by mouth daily., Disp: , Rfl:  .  budesonide-formoterol (SYMBICORT) 160-4.5  MCG/ACT inhaler, Inhale 2 puffs into the lungs 2 (two) times daily., Disp: 1 Inhaler, Rfl: 0 .  Cholecalciferol (VITAMIN D3) 50 MCG (2000 UT) capsule, Take 2,000 Units by mouth daily. , Disp: , Rfl:  .  clopidogrel (PLAVIX) 75 MG tablet, Take 1 tablet (75 mg total) by mouth daily., Disp: 90 tablet, Rfl: 2 .  ezetimibe (ZETIA) 10 MG tablet, Take 1 tablet (10 mg total) by mouth daily., Disp: 90 tablet, Rfl: 3 .  ferrous sulfate 325 (65 FE) MG tablet, Take 650 mg by mouth daily. , Disp: , Rfl:  .  fluticasone (FLONASE) 50 MCG/ACT nasal spray, Place 2 sprays into both nostrils daily., Disp: 16 g, Rfl: 6 .  furosemide (LASIX) 20 MG tablet, Take 1 tablet (20 mg total) by mouth daily as needed for edema., Disp: 30 tablet, Rfl: 3 .  guaiFENesin (MUCINEX) 600 MG 12 hr tablet, Take 600 mg by mouth daily as needed for cough., Disp: , Rfl:  .  insulin aspart (NOVOLOG FLEXPEN) 100 UNIT/ML FlexPen, Sliding scale: BS<150 no insulin, 151-180 give 4U,181-220 give 6U,221-260 give 8U,261-300 give 10U,301-340 give 12 U,and > 341 give 14 u., Disp: 15 mL, Rfl: 2 .  Insulin Glargine (LANTUS SOLOSTAR) 100 UNIT/ML Solostar Pen, Inject 15 Units into the skin at bedtime. (Patient taking differently: Inject 10 Units into the skin at bedtime. ), Disp: 15 mL, Rfl: 1 .  isosorbide mononitrate (IMDUR) 30 MG 24 hr tablet, Take 30 mg by mouth daily. , Disp: , Rfl:  .  lamoTRIgine (LAMICTAL) 200 MG tablet, Take 1.5 tablets (300 mg total) by mouth See admin instructions. Take 1 1/2 tablets (300 mg) by mouth twice daily - 8am and 2pm, Disp: 270 tablet, Rfl: 3 .  losartan (COZAAR) 25 MG tablet, Take 1 tablet (25 mg total) by mouth daily., Disp: 90 tablet, Rfl: 1 .  metoprolol tartrate (LOPRESSOR) 50 MG tablet, Take 1 tablet (50 mg total) by mouth 2 (two) times daily., Disp: 90 tablet, Rfl: 2 .  Omega-3 Fatty Acids (FISH OIL) 1000 MG CAPS, Take 2,000 mg by mouth daily., Disp: , Rfl:  .  phenytoin (DILANTIN) 100 MG ER capsule, T 3 CAPS  (300MG ) PO HS ND (Patient taking differently: Take 300 mg by mouth at bedtime. ), Disp: 270 capsule, Rfl: 3 .  phenytoin (DILANTIN) 30 MG ER capsule, Take 1 capsule (30 mg total) by mouth at bedtime., Disp: 30 capsule, Rfl: 0 .  rosuvastatin (CRESTOR) 40 MG tablet, Take 40 mg by mouth daily., Disp: , Rfl:  .  Spacer/Aero Chamber Mouthpiece MISC, 1 Device by Does not apply route daily as needed., Disp: 1 each, Rfl: 0 .  spironolactone (ALDACTONE) 50 MG tablet, Take 1 tablet (50 mg total) by mouth  daily., Disp: 30 tablet, Rfl: 11 .  tiotropium (SPIRIVA HANDIHALER) 18 MCG inhalation capsule, Place 1 capsule (18 mcg total) into inhaler and inhale daily., Disp: 90 capsule, Rfl: 2  EXAM:  VITALS per patient if applicable:see hpi BP [id number]  GENERAL: alert,  in no visible acute distress  HEENT: atraumatic, conjunttiva clear, no obvious abnormalities on inspection of external nose and ears  NECK: normal movements of the head and neck  LUNGS: on inspection no signs of respiratory distress, breathing rate appears normal, no obvious gross SOB, gasping or wheezing  CV: no obvious cyanosis  PSYCH/NEURO: caregiver does most of the talking  ASSESSMENT AND PLAN:  Discussed the following assessment and plan:  Hypertension associated with diabetes ([place])  Hypertensive urgency  Hyperglycemia  Reviewed ER discharge summary notes. Discussed options with caregiver for the control of the blood sugar. Advised they start the instructions from their PCP in terms of the insulin today and that they call if they need [place] to send to another pharmacy - caregiver declined this at the time of this visit saying that a family member was picking it up from [facility] today. Advised to continue the Norvasc. Advised a healthy diet, but the caregiver is uncertain if the family would want to restrict the patient's diet. Advised monitoring the BS and BP and letting us know if they are running high. Follow up with PCP in 1 month  advised.   I discussed the assessment and treatment plan with the patient. The patient was provided an opportunity to ask questions and all were answered. The patient agreed with the plan and demonstrated an understanding of the instructions.   The patient was advised to call back or seek an in-person evaluation if the symptoms worsen or if the condition fails to improve as anticipated.   Lucretia Kern, DO

## 2018-09-21 NOTE — ED Notes (Signed)
*  DOWNTIME COMPLETE. Please refer to downtime forms*

## 2018-09-21 NOTE — ED Notes (Signed)
Pt and all her belongings went back to [facility] via [facility]. No IV.

## 2018-09-22 NOTE — Telephone Encounter (Signed)
That is fine. She needs to follow sooner if still having problem with BP or glucose control. Thanks, BJ

## 2018-09-22 NOTE — Telephone Encounter (Signed)
Noted.  [facility] aware.

## 2018-09-22 NOTE — Telephone Encounter (Signed)
Message sent to Dr. Jordan for review. 

## 2018-09-22 NOTE — Telephone Encounter (Signed)
Spoke patient's daughter, [name] and gave recommendations per Dr. Martinique. [name] verbalized understanding. [name] stated that patient had a virtual visit with Dr. Maudie Mercury on 09/21/2018 and scheduled an in office visit with Dr. Martinique on 10/24/2018. [name] stated that if patient needed to be seen earlier than 10/24/2018, a Tuesday or Thursday would work best for [employer]. Orders faxed to [facility].

## 2018-10-05 ENCOUNTER — Telehealth: Payer: Self-pay

## 2018-10-05 NOTE — Telephone Encounter (Signed)
Copied from [place] [id number]. Topic: General - Other >> Oct 05, 2018  2:55 PM Wynetta Emery, [place] C wrote: Reason for CRM: Kathryn Lewis pt's daughter called in to be advised. She said that pt was prescribed insulin but is now sleeping a lot/feels tired. She would like to know if that is normal?   CB: [phone]

## 2018-10-05 NOTE — Telephone Encounter (Signed)
Spoke to [place] pt daughter and she stated that the pt is very fatigue lately. Sx started 2 weeks after starting insulin. [name] stated that pt is in assisted living. I asked if they check pt blood sugar before or after meals [name] stated after. I advised [name] to reach out to our office so that we can ask questions regarding pt care. [name] gave me the number to the assisted living facility and fax number .  [place] office staff  nurse cell number [phone] Jethro Poling  Fax number [phone].   Called phone numbers provided with no answer. Will route to Dr. Martinique for advise

## 2018-10-16 NOTE — Telephone Encounter (Signed)
Left message for [name] to return call to clinic.

## 2018-10-16 NOTE — Telephone Encounter (Signed)
Fatigue and sleepiness are not common side effects of insulin unless she is having episodes of hypoglycemia. I have not received BS readings. Also continue monitoring BP's.  Thanks, BJ

## 2018-10-16 NOTE — Telephone Encounter (Signed)
Spoke to [place] on 10/13/2018 and she wanted to make sure that Dr. Martinique was informed of patient's blood sugars, she stated that the assisted living is supposed to fax over readings.

## 2018-10-17 ENCOUNTER — Other Ambulatory Visit: Payer: Self-pay

## 2018-10-17 ENCOUNTER — Non-Acute Institutional Stay: Payer: Medicare Other | Admitting: Hospice

## 2018-10-17 DIAGNOSIS — Z515 Encounter for palliative care: Secondary | ICD-10-CM

## 2018-10-17 NOTE — Telephone Encounter (Signed)
Spoke with nurse  at [facility] and she stated that patient seems to be okay, she will fax over readings to the office. Nothing further needed at this time.

## 2018-10-17 NOTE — Progress Notes (Signed)
[facility] Consult Note Telephone: [phone]  Fax: [phone]  PATIENT NAME: Kathryn Lewis DOB: 02-Apr-1945 MRN: [id number]  PRIMARY CARE PROVIDER:   Martinique, Betty G, MD  REFERRING PROVIDER:  Martinique, Betty G, MD [address] [place],  [place] [address]  RESPONSIBLE PARTY:  Kassidy Huebsch (daughter) [phone]  RECOMMENDATIONS/PLAN:  1. Advance Care Planning/Goals of Care: Goals include to maximize quality of life and symptom management. DNR singed, in the facility chart. Spoke with [name], introducing the usefulness of MOST form as a tool of advance care planning, answering her questions. She requested MOST form to be mailed to her residence so she can go through it again.   2. Symptom management: Hyperglycemia now under control with Lantus increased to 15units daily. Continue Novolog as ordered. No concentrated sweets. Initiate hypoglycemic/hyperglycemic protocol per facility.  3. Follow up Palliative Care Visit: Palliative care will continue to follow for goals of care clarification and symptom management.   I spent 55 minutes providing this consultation, from 12.30pm to 1.25pm  More than 50% of the time in this consultation was spent coordinating advance care, communication chart review, patient /clinical staff education.   HISTORY OF PRESENT ILLNESS:  Kathryn Lewis is a 73 year old female with multiple medical problems including HTN, Type 2 DM, HLD, CAD, s/pCVA, gait disorder, STML, COPD, OSA. Palliative Care was asked to help address goals of care.  CODE STATUS: DNR  PPS: 50% HOSPICE ELIGIBILITY/DIAGNOSIS: TBD  PAST MEDICAL HISTORY:  Past Medical History:  Diagnosis Date  . Allergy   . Arthritis   . Bipolar 1 disorder ([place])   . Blood transfusion without reported diagnosis   . Carotid artery stenosis    50-69% bilateral ICA stenoses by velocity criteria but no visible plaque and ICA/CCA ratio 1.72 on right and 1.38  on left  . Coronary artery disease   . Depression   . Diabetes mellitus without complication ([place])   . Echocardiogram    Echo 06/2018: Hyperdynamic systolic function, EF 99991111, mild concentric LVH, elevated LVEDP (E/E' suggests impaired relaxation), mild AI, lipomatous interatrial septum  . Hypertension   . Left adrenal mass (H[facility])    2.5 cm L adrenal mass on CT per note of Dr. Seleta Rhymes 05/20/2014  . MI, old   . PAD (peripheral artery disease) ([place])    03/2014 - CT aortogram with lower extremity runoff (mild to moderate disease in common iliac arteries, complete occulusion of her bilateral external iliac arteries with heavily disease common femoral arteries. She also has disease to her SFAs bilaterally. Popliteal arteries and tibial vessel runoff appears to be adequate)  . Seizures ([place])   . Stroke [facility])     SOCIAL HX:  Social History   Tobacco Use  . Smoking status: Former Smoker    Packs/day: 1.00    Types: Cigarettes    Quit date: 2015    Years since quitting: 5.7  . Smokeless tobacco: Never Used  Substance Use Topics  . Alcohol use: No    ALLERGIES:  Allergies  Allergen Reactions  . Influenza Vaccines Anaphylaxis  . Amoxicillin Other (See Comments)    colitis  . Augmentin [Amoxicillin-Pot Clavulanate] Other (See Comments)    colitis  . Cephalexin Other (See Comments)    colitis  . Erythromycin Other (See Comments)    colitis  . Niacin And Related Other (See Comments)    colitis  . Cefadroxil Other (See Comments)    Reported by  [facility] 2013 - unknown reaction  . Nitrofurantoin Macrocrystal Other (See Comments)    Reported by [facility] 2013 - unknown reaction     PERTINENT MEDICATIONS:  Outpatient Encounter Medications as of 10/17/2018  Medication Sig  . acetaminophen (TYLENOL) 500 MG tablet Take 500 mg by mouth 4 (four) times daily as needed.  Marland Kitchen albuterol (PROVENTIL HFA;VENTOLIN HFA) 108 (90 Base) MCG/ACT inhaler Inhale 2 puffs into the lungs every 6 (six) hours  as needed for wheezing or shortness of breath.  Marland Kitchen amLODipine (NORVASC) 10 MG tablet Take 1 tablet (10 mg total) by mouth daily.  Marland Kitchen aspirin EC 81 MG tablet Take 81 mg by mouth daily.  . budesonide-formoterol (SYMBICORT) 160-4.5 MCG/ACT inhaler Inhale 2 puffs into the lungs 2 (two) times daily.  . Cholecalciferol (VITAMIN D3) 50 MCG (2000 UT) capsule Take 2,000 Units by mouth daily.   . clopidogrel (PLAVIX) 75 MG tablet Take 1 tablet (75 mg total) by mouth daily.  Marland Kitchen ezetimibe (ZETIA) 10 MG tablet Take 1 tablet (10 mg total) by mouth daily.  . ferrous sulfate 325 (65 FE) MG tablet Take 650 mg by mouth daily.   . fluticasone (FLONASE) 50 MCG/ACT nasal spray Place 2 sprays into both nostrils daily.  . furosemide (LASIX) 20 MG tablet Take 1 tablet (20 mg total) by mouth daily as needed for edema.  Marland Kitchen guaiFENesin (MUCINEX) 600 MG 12 hr tablet Take 600 mg by mouth daily as needed for cough.  . insulin aspart (NOVOLOG FLEXPEN) 100 UNIT/ML FlexPen Sliding scale: BS<150 no insulin, 151-180 give 4U,181-220 give 6U,221-260 give 8U,261-300 give 10U,301-340 give 12 U,and > 341 give 14 u.  . Insulin Glargine (LANTUS SOLOSTAR) 100 UNIT/ML Solostar Pen Inject 15 Units into the skin at bedtime. (Patient taking differently: Inject 10 Units into the skin at bedtime. )  . isosorbide mononitrate (IMDUR) 30 MG 24 hr tablet Take 30 mg by mouth daily.   Marland Kitchen lamoTRIgine (LAMICTAL) 200 MG tablet Take 1.5 tablets (300 mg total) by mouth See admin instructions. Take 1 1/2 tablets (300 mg) by mouth twice daily - 8am and 2pm  . losartan (COZAAR) 25 MG tablet Take 1 tablet (25 mg total) by mouth daily.  . metoprolol tartrate (LOPRESSOR) 50 MG tablet Take 1 tablet (50 mg total) by mouth 2 (two) times daily.  . Omega-3 Fatty Acids (FISH OIL) 1000 MG CAPS Take 2,000 mg by mouth daily.  . phenytoin (DILANTIN) 100 MG ER capsule T 3 CAPS (300MG ) PO HS ND (Patient taking differently: Take 300 mg by mouth at bedtime. )  . phenytoin  (DILANTIN) 30 MG ER capsule Take 1 capsule (30 mg total) by mouth at bedtime.  . rosuvastatin (CRESTOR) 40 MG tablet Take 40 mg by mouth daily.  Marland Kitchen Spacer/Aero Chamber Mouthpiece MISC 1 Device by Does not apply route daily as needed.  Marland Kitchen spironolactone (ALDACTONE) 50 MG tablet Take 1 tablet (50 mg total) by mouth daily.  Marland Kitchen tiotropium (SPIRIVA HANDIHALER) 18 MCG inhalation capsule Place 1 capsule (18 mcg total) into inhaler and inhale daily.   No facility-administered encounter medications on file as of 10/17/2018.     PHYSICAL EXAM:   General: NAD, frail appearing, thin Cardiovascular: regular rate and rhythm Pulmonary: normal respiratory effort, no SOB Abdomen: soft, nontender, + bowel sounds in all quadrants GU: no suprapubic tenderness Extremities: no edema, no joint deformities Skin: no rashes on exposed skin Neurological: Weakness, forgetful  Teodoro Spray, NP

## 2018-10-24 ENCOUNTER — Ambulatory Visit (INDEPENDENT_AMBULATORY_CARE_PROVIDER_SITE_OTHER): Payer: Medicare Other | Admitting: Family Medicine

## 2018-10-24 ENCOUNTER — Other Ambulatory Visit: Payer: Self-pay

## 2018-10-24 ENCOUNTER — Encounter: Payer: Self-pay | Admitting: Family Medicine

## 2018-10-24 VITALS — BP 132/80 | HR 73 | Temp 96.4°F | Resp 16 | Ht 61.0 in | Wt 137.0 lb

## 2018-10-24 DIAGNOSIS — I119 Hypertensive heart disease without heart failure: Secondary | ICD-10-CM

## 2018-10-24 DIAGNOSIS — Z0000 Encounter for general adult medical examination without abnormal findings: Secondary | ICD-10-CM

## 2018-10-24 DIAGNOSIS — I6523 Occlusion and stenosis of bilateral carotid arteries: Secondary | ICD-10-CM

## 2018-10-24 DIAGNOSIS — R2681 Unsteadiness on feet: Secondary | ICD-10-CM | POA: Diagnosis not present

## 2018-10-24 DIAGNOSIS — E1149 Type 2 diabetes mellitus with other diabetic neurological complication: Secondary | ICD-10-CM | POA: Diagnosis not present

## 2018-10-24 DIAGNOSIS — I25118 Atherosclerotic heart disease of native coronary artery with other forms of angina pectoris: Secondary | ICD-10-CM | POA: Diagnosis not present

## 2018-10-24 DIAGNOSIS — G629 Polyneuropathy, unspecified: Secondary | ICD-10-CM | POA: Diagnosis not present

## 2018-10-24 LAB — POCT GLYCOSYLATED HEMOGLOBIN (HGB A1C)
HbA1c POC (<> result, manual entry): 8.9 % (ref 4.0–5.6)
HbA1c, POC (controlled diabetic range): 8.9 % — AB (ref 0.0–7.0)
HbA1c, POC (prediabetic range): 8.9 % — AB (ref 5.7–6.4)
Hemoglobin A1C: 8.9 % — AB (ref 4.0–5.6)

## 2018-10-24 MED ORDER — LANTUS SOLOSTAR 100 UNIT/ML ~~LOC~~ SOPN: 20.0000 [IU] | PEN_INJECTOR | Freq: Every day | SUBCUTANEOUS | 1 refills | Status: DC

## 2018-10-24 MED ORDER — LOSARTAN POTASSIUM 50 MG PO TABS: 50.0000 mg | ORAL_TABLET | Freq: Every day | ORAL | 1 refills | Status: DC

## 2018-10-24 NOTE — Progress Notes (Addendum)
HPI:   Kathryn Lewis is a 73 y.o. female, who is here today for chronic disease management. Here alone,we called daughter to be here so she can help with providing Hx.  She was recently evaluated through video visit due to elevated BP and elevated glucose numbers. No changes in current medications.  DM II: FG: Most in the 80s to 170s, 3011 and in the 200sx2. 2 hours after lunch 200s to 300s, 4821. 2 hours after dinner 200s to 300s, 4481. In average she receives NovoLog about 6 to 14 units daily. She is on Lantus 15 units daily. Negative for episodes of hypoglycemia.  According to her daughter, her diet is not good. She is offered starches, bread, and sweet tea as part of her meals.  Denies abdominal pain, nausea,vomiting, polydipsia,polyuria, or polyphagia. History of peripheral neuropathy, lower extremity pain that is aggravated with ambulation. Her daughter is requesting a referral to see Dr.Lovorn for dry needle therapy, hoping this may help with LE pain.  Lab Results  Component Value Date   HGBA1C 5.5 07/08/2017   HTN: Currently she is on spironolactone 50 mg daily, losartan 25 mg daily, Imdur 30 mg daily, and metoprolol tartrate 50 mg twice daily. BPs have been elevated 150s/90s.  She denies unusual headache, visual changes, chest pain, unusual dyspnea, diaphoresis, or edema.  Lab Results  Component Value Date   CREATININE 0.60 09/20/2018   BUN 6 (L) 09/20/2018   NA 136 09/20/2018   K 4.1 09/20/2018   CL 101 09/20/2018   CO2 24 09/20/2018   HLD: She is on Cresto 40 mg and Zetia 10 mg daily. Tolerating medications well,no side effects reported.  Lab Results  Component Value Date   CHOL 183 09/09/2017   HDL 66 09/09/2017   LDLCALC 85 09/09/2017   TRIG 160 (H) 09/09/2017   CHOLHDL 2.8 09/09/2017    She is also due for her AWV and daughter agrees with proceeding with Medicare visit. She has not had a AWV in years.   She lives in  [place] living facility, [facility]. Needs assistance with most  ADL's and IADL's. Hx of frequent falls, she has had several in the past year, does not recall when the last one was. Hx of bipolar disorder and depression. She is on Lamictal 300 mg daily and Dilantin 100 mg 3 caps at bedtime.   Functional Status Survey: Is the patient deaf or have difficulty hearing?: No Does the patient have difficulty seeing, even when wearing glasses/contacts?: No Does the patient have difficulty concentrating, remembering, or making decisions?: Yes Does the patient have difficulty walking or climbing stairs?: Yes Does the patient have difficulty dressing or bathing?: Yes(Sometimes. She has a shower chair.) Does the patient have difficulty doing errands alone such as visiting a doctor's office or shopping?: Yes  Fall Risk  10/24/2018 03/13/2018 03/18/2017  Falls in the past year? 1 1 Yes  Number falls in past yr: 1 0 2 or more  Injury with Fall? 0 0 Yes  Risk Factor Category  - - High Fall Risk  Risk for fall due to : Impaired balance/gait;Impaired mobility;Medication side effect - -  Follow up Education provided - -    Providers she sees regularly: Eye care provider: N/A Neurologist: Dr. Delice Lesch Cardiology: Richardson Dopp, PA  Depression screen [facility] 2/9 10/24/2018  Decreased Interest 0  Down, Depressed, Hopeless 0  PHQ - 2 Score 0  Altered sleeping 0  Tired, decreased energy  2  Feeling bad or failure about yourself  3  Trouble concentrating 3  Moving slowly or fidgety/restless 0  Suicidal thoughts 0  PHQ-9 Score 8    Mini-Cog - 10/24/18 1317    Normal clock drawing test?  no    How many words correct?  0          Hearing Screening   125Hz  250Hz  500Hz  1000Hz  2000Hz  3000Hz  4000Hz  6000Hz  8000Hz   Right ear:           Left ear:           Vision Screening Comments: Can not recognized letters.  She has above addressed CVD risk factors.  Review of Systems   Constitutional: Positive for fatigue (chronic). Negative for activity change, appetite change and fever.  HENT: Negative for mouth sores, nosebleeds and sore throat.   Eyes: Negative for redness and visual disturbance.  Respiratory: Negative for cough and wheezing.   Cardiovascular: Negative for palpitations and leg swelling.  Gastrointestinal:       Negative for changes in bowel habits.  Endocrine: Negative for cold intolerance and heat intolerance.  Genitourinary: Negative for decreased urine volume, dysuria and hematuria.  Musculoskeletal: Positive for arthralgias, gait problem and myalgias.  Skin: Negative for rash and wound.  Allergic/Immunologic: Positive for environmental allergies.  Neurological: Negative for seizures, syncope and facial asymmetry.  Psychiatric/Behavioral: Negative for hallucinations. The patient is nervous/anxious.   Rest see pertinent positives and negatives per HPI.   Current Outpatient Medications on File Prior to Visit  Medication Sig Dispense Refill  . acetaminophen (TYLENOL) 500 MG tablet Take 500 mg by mouth 4 (four) times daily as needed.    Marland Kitchen albuterol (PROVENTIL HFA;VENTOLIN HFA) 108 (90 Base) MCG/ACT inhaler Inhale 2 puffs into the lungs every 6 (six) hours as needed for wheezing or shortness of breath. 1 Inhaler 6  . amLODipine (NORVASC) 10 MG tablet Take 1 tablet (10 mg total) by mouth daily. 30 tablet 0  . aspirin EC 81 MG tablet Take 81 mg by mouth daily.    . budesonide-formoterol (SYMBICORT) 160-4.5 MCG/ACT inhaler Inhale 2 puffs into the lungs 2 (two) times daily. 1 Inhaler 0  . Cholecalciferol (VITAMIN D3) 50 MCG (2000 UT) capsule Take 2,000 Units by mouth daily.     . clopidogrel (PLAVIX) 75 MG tablet Take 1 tablet (75 mg total) by mouth daily. 90 tablet 2  . ezetimibe (ZETIA) 10 MG tablet Take 1 tablet (10 mg total) by mouth daily. 90 tablet 3  . ferrous sulfate 325 (65 FE) MG tablet Take 650 mg by mouth daily.     . fluticasone (FLONASE) 50  MCG/ACT nasal spray Place 2 sprays into both nostrils daily. 16 g 6  . furosemide (LASIX) 20 MG tablet Take 1 tablet (20 mg total) by mouth daily as needed for edema. 30 tablet 3  . guaiFENesin (MUCINEX) 600 MG 12 hr tablet Take 600 mg by mouth daily as needed for cough.    . insulin aspart (NOVOLOG FLEXPEN) 100 UNIT/ML FlexPen Sliding scale: BS<150 no insulin, 151-180 give 4U,181-220 give 6U,221-260 give 8U,261-300 give 10U,301-340 give 12 U,and > 341 give 14 u. 15 mL 2  . isosorbide mononitrate (IMDUR) 30 MG 24 hr tablet Take 30 mg by mouth daily.     Marland Kitchen lamoTRIgine (LAMICTAL) 200 MG tablet Take 1.5 tablets (300 mg total) by mouth See admin instructions. Take 1 1/2 tablets (300 mg) by mouth twice daily - 8am and 2pm 270 tablet 3  .  metoprolol tartrate (LOPRESSOR) 50 MG tablet Take 1 tablet (50 mg total) by mouth 2 (two) times daily. 90 tablet 2  . Omega-3 Fatty Acids (FISH OIL) 1000 MG CAPS Take 2,000 mg by mouth daily.    . phenytoin (DILANTIN) 100 MG ER capsule T 3 CAPS (300MG ) PO HS ND (Patient taking differently: Take 300 mg by mouth at bedtime. ) 270 capsule 3  . phenytoin (DILANTIN) 30 MG ER capsule Take 1 capsule (30 mg total) by mouth at bedtime. 30 capsule 0  . rosuvastatin (CRESTOR) 40 MG tablet Take 40 mg by mouth daily.    Marland Kitchen Spacer/Aero Chamber Mouthpiece MISC 1 Device by Does not apply route daily as needed. 1 each 0  . spironolactone (ALDACTONE) 50 MG tablet Take 1 tablet (50 mg total) by mouth daily. 30 tablet 11  . tiotropium (SPIRIVA HANDIHALER) 18 MCG inhalation capsule Place 1 capsule (18 mcg total) into inhaler and inhale daily. 90 capsule 2   No current facility-administered medications on file prior to visit.      Past Medical History:  Diagnosis Date  . Allergy   . Arthritis   . Bipolar 1 disorder ([place])   . Blood transfusion without reported diagnosis   . Carotid artery stenosis    50-69% bilateral ICA stenoses by velocity criteria but no visible plaque and ICA/CCA  ratio 1.72 on right and 1.38 on left  . Coronary artery disease   . Depression   . Diabetes mellitus without complication ([place])   . Echocardiogram    Echo 06/2018: Hyperdynamic systolic function, EF 99991111, mild concentric LVH, elevated LVEDP (E/E' suggests impaired relaxation), mild AI, lipomatous interatrial septum  . Hypertension   . Left adrenal mass ([facility])    2.5 cm L adrenal mass on CT per note of Dr. Seleta Rhymes 05/20/2014  . MI, old   . PAD (peripheral artery disease) ([place])    03/2014 - CT aortogram with lower extremity runoff (mild to moderate disease in common iliac arteries, complete occulusion of her bilateral external iliac arteries with heavily disease common femoral arteries. She also has disease to her SFAs bilaterally. Popliteal arteries and tibial vessel runoff appears to be adequate)  . Seizures ([place])   . Stroke [facility])    Allergies  Allergen Reactions  . Influenza Vaccines Anaphylaxis  . Amoxicillin Other (See Comments)    colitis  . Augmentin [Amoxicillin-Pot Clavulanate] Other (See Comments)    colitis  . Cephalexin Other (See Comments)    colitis  . Erythromycin Other (See Comments)    colitis  . Niacin And Related Other (See Comments)    colitis  . Cefadroxil Other (See Comments)    Reported by [facility] 2013 - unknown reaction  . Nitrofurantoin Macrocrystal Other (See Comments)    Reported by [facility] 2013 - unknown reaction    Social History   Socioeconomic History  . Marital status: Single    Spouse name: Not on file  . Number of children: Not on file  . Years of education: Not on file  . Highest education level: Not on file  Occupational History  . Not on file  Social Needs  . Financial resource strain: Not on file  . Food insecurity    Worry: Not on file    Inability: Not on file  . Transportation needs    Medical: Not on file    Non-medical: Not on file  Tobacco Use  . Smoking status: Former Smoker    Packs/day: 1.00  Types: Cigarettes     Quit date: 2015    Years since quitting: 5.7  . Smokeless tobacco: Never Used  Substance and Sexual Activity  . Alcohol use: No  . Drug use: No  . Sexual activity: Not on file  Lifestyle  . Physical activity    Days per week: Not on file    Minutes per session: Not on file  . Stress: Not on file  Relationships  . Social Herbalist on phone: Not on file    Gets together: Not on file    Attends religious service: Not on file    Active member of club or organization: Not on file    Attends meetings of clubs or organizations: Not on file    Relationship status: Not on file  Other Topics Concern  . Not on file  Social History Narrative   Pt lives in assisted living facility   Has 2 adult children   College   Worked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    Vitals:   10/24/18 1140  BP: 132/80  Pulse: 73  Resp: 16  Temp: (!) 96.4 F (35.8 C)  SpO2: 97%   Body mass index is 25.89 kg/m.  Physical Exam  Nursing note and vitals reviewed. Constitutional: She is oriented to person, place, and time. She appears well-developed. No distress.  HENT:  Head: Normocephalic and atraumatic.  Mouth/Throat: Oropharynx is clear and moist and mucous membranes are normal.  Eyes: Pupils are equal, round, and reactive to light. Conjunctivae are normal.  Cardiovascular: Normal rate and regular rhythm.  No murmur heard. DP pulses present bilateral.  Respiratory: Effort normal and breath sounds normal. No respiratory distress.  GI: Soft. She exhibits no mass. There is no abdominal tenderness.  Musculoskeletal:        General: No edema.  Lymphadenopathy:    She has no cervical adenopathy.  Neurological: She is alert and oriented to person, place, and time.  She is in her wheel chair. No focal deficit appreciated. Oriented x 2. She doe snot remember date. President of [place] "Bush."  Skin: Skin is warm. No rash noted. No erythema.  Psychiatric: Her mood appears anxious. She is not agitated.  Cognition and memory are impaired. She exhibits abnormal recent memory.  Well groomed, good eye contact.    ASSESSMENT AND PLAN:  Kathryn Lewis was seen today with her daughter for follow-up and medicare wellness. Daughter provides history.  Diagnoses and all orders for this visit:  Orders Placed This Encounter  Procedures  . Ambulatory referral to Pain Clinic  . POCT glycosylated hemoglobin (Hb A1C)    Lab Results  Component Value Date   HGBA1C 8.9 (A) 10/24/2018   Medicare annual wellness visit, subsequent We discussed the importance of staying active, physically and mentally, as well as the benefits of a healthy/balance diet. Low impact exercise as tolerated that involve stretching and strengthing are ideal and under supervision due to risk for falls. Vaccines up to date. Refused influenza vaccine. We discussed preventive screening for the next 5-10 years, summery of recommendations discussed, she refused further screening tests. Advance directives and end of life discussed, she has POA and living will.Her daughter has documents with her, see scanned document.    Diabetes mellitus type 2 with neurological manifestations ([place]) HgA1C is not at goal. Lantus dose increased from 15 U to 20 U. No changes in Novolog sliding scale. No sweet tea. Healthy diet with avoidance of added sugar food intake  is an important part of treatment and recommended. Annual eye exam is overdue. Adequate foot care recommended. F/U in 3-4 months   Hypertension with heart disease Re-checked BP x 2 : 160/80, 162/85. Goal < 150/90 at least. Losartan dose decreased from 25 mg to 50 mg daily. Continue spironolactone 50 mg daily, metoprolol tartrate 50 mg twice daily, amlodipine 10 mg daily, and Imdur 30 mg daily.  Continue monitoring BP regularly. Low-salt diet recommended. Follow-up in 3 months, before if needed.   Atherosclerotic heart disease of native coronary artery with other forms of angina  pectoris ([place]) Asymptomatic. Following with cardiologist,last visit 03/2018. Continue Aspirin 81 mg daily and Crestor 40 mg daily. Instructed about warning signs.  Unstable gait Most falls have happened because she tries to get up from wheelchair with no assistance. For precaution discussed. She has completed PT a few times.  Peripheral polyneuropathy Referral to Dr Dagoberto Ligas as requested by daughter place. Appropriate food care. Fall precautions and adequate glucose control.   Return in about 3 months (around 01/23/2019) for HTN,DM II.   -Kathryn Lewis was advised to return sooner than planned today if new concerns arise.    G. Martinique, MD  [facility]. [place] office.

## 2018-10-24 NOTE — Assessment & Plan Note (Addendum)
Most falls have happened because she tries to get up from wheelchair with no assistance. For precaution discussed. She has completed PT a few times.

## 2018-10-24 NOTE — Assessment & Plan Note (Signed)
Referral to Dr Dagoberto Ligas as requested by daughter place. Appropriate food care. Fall precautions and adequate glucose control.

## 2018-10-24 NOTE — Assessment & Plan Note (Signed)
Re-checked BP x 2 : 160/80, 162/85. Goal < 150/90 at least. Losartan dose decreased from 25 mg to 50 mg daily. Continue spironolactone 50 mg daily, metoprolol tartrate 50 mg twice daily, amlodipine 10 mg daily, and Imdur 30 mg daily.  Continue monitoring BP regularly. Low-salt diet recommended. Follow-up in 3 months, before if needed.

## 2018-10-24 NOTE — Assessment & Plan Note (Signed)
HgA1C is not at goal. Lantus dose increased from 15 U to 20 U. No changes in Novolog sliding scale. No sweet tea. Healthy diet with avoidance of added sugar food intake is an important part of treatment and recommended. Annual eye exam is overdue. Adequate foot care recommended. F/U in 3-4 months

## 2018-10-24 NOTE — Assessment & Plan Note (Addendum)
Asymptomatic. Following with cardiologist,last visit 03/2018. Continue Aspirin 81 mg daily and Crestor 40 mg daily. Instructed about warning signs.

## 2018-10-24 NOTE — Patient Instructions (Addendum)
  Kathryn Lewis , Thank you for taking time to come for your Medicare Wellness Visit. I appreciate your ongoing commitment to your health goals. Please review the following plan we discussed and let me know if I can assist you in the future.   These are the goals we discussed: Goals   None     This is a list of the screening recommended for you and due dates:  Health Maintenance  Topic Date Due  . Hemoglobin A1C  01/07/2018  . Complete foot exam   01/10/2018  . Eye exam for diabetics  02/27/2018  . Flu Shot  08/26/2018  . Mammogram  07/19/2019*  . Colon Cancer Screening  01/25/2021  . Tetanus Vaccine  06/01/2027  . DEXA scan (bone density measurement)  Completed  .  Hepatitis C: One time screening is recommended by Center for Disease Control  (CDC) for  adults born from [birth year] through 1965.   Completed  . Pneumonia vaccines  Discontinued  *Topic was postponed. The date shown is not the original due date.   A few things to remember from today's visit:   Diabetes mellitus type 2 with neurological manifestations ([place]) - Plan: POCT glycosylated hemoglobin (Hb A1C)  Hypertensive urgency  Unstable gait

## 2018-10-31 ENCOUNTER — Encounter: Payer: Self-pay | Admitting: Physical Medicine and Rehabilitation

## 2018-11-09 ENCOUNTER — Encounter: Payer: Medicare Other | Admitting: Physical Medicine and Rehabilitation

## 2018-12-01 ENCOUNTER — Other Ambulatory Visit: Payer: Medicare Other | Admitting: Hospice

## 2018-12-01 ENCOUNTER — Other Ambulatory Visit: Payer: Self-pay

## 2018-12-01 DIAGNOSIS — Z515 Encounter for palliative care: Secondary | ICD-10-CM | POA: Diagnosis not present

## 2018-12-01 NOTE — Progress Notes (Signed)
[facility] Consult Note Telephone: [phone]  Fax: [phone]  PATIENT NAME: Kathryn Lewis DOB: [birth year] MRN: [id number]  PRIMARY CARE PROVIDER:   Martinique, Betty G, MD  REFERRING PROVIDER:  Martinique, Betty G, MD [address] [place],  [place] [address]  RESPONSIBLE PARTY:  Rayni Hindsman (daughter) [phone]  RECOMMENDATIONS/PLAN:  1. Advance Care Planning/Goals of Care: Patient remains a resident at [facility] with goals  including to maximize quality of life and symptom management. She is a DNR; DNR form up to date in facility chart and uploaded in [facility]. Had spoken with [name] about the usefulness of MOST form as a tool of advance care planning, answering her questions. Mailed her a copy to read through. Will continue to follow up for further discussions of advance care and goals of care.  2. Symptom management: Hyperglycemia now under control with Lantus increased to 15units daily. Last A1c 8.9 10/24/2018.  Continue Novolog and Lantus as ordered. No concentrated sweets. Initiate hypoglycemic/hyperglycemic protocol per facility. 3. Follow up Palliative Care Visit: Palliative care will continue to follow for goals of care clarification and symptom management.   I spent 20 minutes providing this consultation, from 11.30am to 12.00pm  More than 50% of the time in this consultation was spent coordinating communication.  HISTORY OF PRESENT ILLNESS:[name] a 66year oldfemalewith multiple medical problems including HTN, Type 2 DM, HLD, CAD, s/pCVA, gait disorder, STML, COPD, OSA. Palliative Care was asked to help address goals of care.  CODE STATUS: DNR  PPS: 50% HOSPICE ELIGIBILITY/DIAGNOSIS: TBD  PAST MEDICAL HISTORY:  Past Medical History:  Diagnosis Date  . Allergy   . Arthritis   . Bipolar 1 disorder ([place])   . Blood transfusion without reported diagnosis   . Carotid artery stenosis    50-69% bilateral ICA stenoses by velocity criteria but no visible plaque and ICA/CCA ratio 1.72 on right and 1.38 on left  . Coronary artery disease   . Depression   . Diabetes mellitus without complication ([place])   . Echocardiogram    Echo 06/2018: Hyperdynamic systolic function, EF 99991111, mild concentric LVH, elevated LVEDP (E/E' suggests impaired relaxation), mild AI, lipomatous interatrial septum  . Hypertension   . Left adrenal mass (HCC)    2.5 cm L adrenal mass on CT per note of Dr. Seleta Rhymes 05/20/2014  . MI, old   . PAD (peripheral artery disease) ([place])    03/2014 - CT aortogram with lower extremity runoff (mild to moderate disease in common iliac arteries, complete occulusion of her bilateral external iliac arteries with heavily disease common femoral arteries. She also has disease to her SFAs bilaterally. Popliteal arteries and tibial vessel runoff appears to be adequate)  . Seizures ([place])   . Stroke [facility])     SOCIAL HX:  Social History   Tobacco Use  . Smoking status: Former Smoker    Packs/day: 1.00    Types: Cigarettes    Quit date: 2015    Years since quitting: 5.8  . Smokeless tobacco: Never Used  Substance Use Topics  . Alcohol use: No    ALLERGIES:  Allergies  Allergen Reactions  . Influenza Vaccines Anaphylaxis  . Amoxicillin Other (See Comments)    colitis  . Augmentin [Amoxicillin-Pot Clavulanate] Other (See Comments)    colitis  . Cephalexin Other (See Comments)    colitis  . Erythromycin Other (See Comments)    colitis  . Niacin And Related Other (See  Comments)    colitis  . Cefadroxil Other (See Comments)    Reported by [facility] 2013 - unknown reaction  . Nitrofurantoin Macrocrystal Other (See Comments)    Reported by [facility] 2013 - unknown reaction     PERTINENT MEDICATIONS:  Outpatient Encounter Medications as of 12/01/2018  Medication Sig  . acetaminophen (TYLENOL) 500 MG tablet Take 500 mg by mouth 4 (four) times daily as needed.  Marland Kitchen  albuterol (PROVENTIL HFA;VENTOLIN HFA) 108 (90 Base) MCG/ACT inhaler Inhale 2 puffs into the lungs every 6 (six) hours as needed for wheezing or shortness of breath.  Marland Kitchen amLODipine (NORVASC) 10 MG tablet Take 1 tablet (10 mg total) by mouth daily.  Marland Kitchen aspirin EC 81 MG tablet Take 81 mg by mouth daily.  . budesonide-formoterol (SYMBICORT) 160-4.5 MCG/ACT inhaler Inhale 2 puffs into the lungs 2 (two) times daily.  . Cholecalciferol (VITAMIN D3) 50 MCG (2000 UT) capsule Take 2,000 Units by mouth daily.   . clopidogrel (PLAVIX) 75 MG tablet Take 1 tablet (75 mg total) by mouth daily.  Marland Kitchen ezetimibe (ZETIA) 10 MG tablet Take 1 tablet (10 mg total) by mouth daily.  . ferrous sulfate 325 (65 FE) MG tablet Take 650 mg by mouth daily.   . fluticasone (FLONASE) 50 MCG/ACT nasal spray Place 2 sprays into both nostrils daily.  . furosemide (LASIX) 20 MG tablet Take 1 tablet (20 mg total) by mouth daily as needed for edema.  Marland Kitchen guaiFENesin (MUCINEX) 600 MG 12 hr tablet Take 600 mg by mouth daily as needed for cough.  . insulin aspart (NOVOLOG FLEXPEN) 100 UNIT/ML FlexPen Sliding scale: BS<150 no insulin, 151-180 give 4U,181-220 give 6U,221-260 give 8U,261-300 give 10U,301-340 give 12 U,and > 341 give 14 u.  . Insulin Glargine (LANTUS SOLOSTAR) 100 UNIT/ML Solostar Pen Inject 20 Units into the skin at bedtime.  . isosorbide mononitrate (IMDUR) 30 MG 24 hr tablet Take 30 mg by mouth daily.   Marland Kitchen lamoTRIgine (LAMICTAL) 200 MG tablet Take 1.5 tablets (300 mg total) by mouth See admin instructions. Take 1 1/2 tablets (300 mg) by mouth twice daily - 8am and 2pm  . losartan (COZAAR) 50 MG tablet Take 1 tablet (50 mg total) by mouth daily.  . metoprolol tartrate (LOPRESSOR) 50 MG tablet Take 1 tablet (50 mg total) by mouth 2 (two) times daily.  . Omega-3 Fatty Acids (FISH OIL) 1000 MG CAPS Take 2,000 mg by mouth daily.  . phenytoin (DILANTIN) 100 MG ER capsule T 3 CAPS (300MG ) PO HS ND (Patient taking differently: Take 300 mg  by mouth at bedtime. )  . phenytoin (DILANTIN) 30 MG ER capsule Take 1 capsule (30 mg total) by mouth at bedtime.  . rosuvastatin (CRESTOR) 40 MG tablet Take 40 mg by mouth daily.  Marland Kitchen Spacer/Aero Chamber Mouthpiece MISC 1 Device by Does not apply route daily as needed.  Marland Kitchen spironolactone (ALDACTONE) 50 MG tablet Take 1 tablet (50 mg total) by mouth daily.  Marland Kitchen tiotropium (SPIRIVA HANDIHALER) 18 MCG inhalation capsule Place 1 capsule (18 mcg total) into inhaler and inhale daily.   No facility-administered encounter medications on file as of 12/01/2018.     PHYSICAL EXAM/ROM:   General:cooperative, no in acute distress Cardiovascular: regular rate and rhythm Pulmonary: normal respiratory effort, no SOB Abdomen: soft, nontender, + bowel sounds in all quadrants GU: no suprapubic tenderness Extremities: no edema, no joint deformities Skin: no rashes on exposed skin Neurological: Weakness, forgetful  Teodoro Spray, NP

## 2018-12-07 DIAGNOSIS — H04123 Dry eye syndrome of bilateral lacrimal glands: Secondary | ICD-10-CM | POA: Diagnosis not present

## 2019-01-09 ENCOUNTER — Other Ambulatory Visit: Payer: Self-pay | Admitting: Family Medicine

## 2019-01-09 DIAGNOSIS — E1149 Type 2 diabetes mellitus with other diabetic neurological complication: Secondary | ICD-10-CM

## 2019-01-09 MED ORDER — LANTUS SOLOSTAR 100 UNIT/ML ~~LOC~~ SOPN: 20.0000 [IU] | PEN_INJECTOR | Freq: Every day | SUBCUTANEOUS | 3 refills | Status: DC

## 2019-01-10 ENCOUNTER — Other Ambulatory Visit: Payer: Self-pay | Admitting: Family Medicine

## 2019-01-22 ENCOUNTER — Other Ambulatory Visit: Payer: Self-pay

## 2019-01-22 ENCOUNTER — Encounter: Payer: Self-pay | Admitting: Family Medicine

## 2019-01-22 ENCOUNTER — Encounter: Payer: Medicare Other | Admitting: Family Medicine

## 2019-01-23 ENCOUNTER — Ambulatory Visit (INDEPENDENT_AMBULATORY_CARE_PROVIDER_SITE_OTHER): Payer: Medicare Other | Admitting: Family Medicine

## 2019-01-23 ENCOUNTER — Encounter: Payer: Self-pay | Admitting: Family Medicine

## 2019-01-23 VITALS — BP 128/70 | HR 63 | Temp 95.9°F | Resp 12 | Ht 61.0 in

## 2019-01-23 DIAGNOSIS — I6523 Occlusion and stenosis of bilateral carotid arteries: Secondary | ICD-10-CM

## 2019-01-23 DIAGNOSIS — E1149 Type 2 diabetes mellitus with other diabetic neurological complication: Secondary | ICD-10-CM | POA: Diagnosis not present

## 2019-01-23 DIAGNOSIS — G629 Polyneuropathy, unspecified: Secondary | ICD-10-CM | POA: Diagnosis not present

## 2019-01-23 DIAGNOSIS — G8929 Other chronic pain: Secondary | ICD-10-CM

## 2019-01-23 DIAGNOSIS — M545 Low back pain, unspecified: Secondary | ICD-10-CM

## 2019-01-23 DIAGNOSIS — I119 Hypertensive heart disease without heart failure: Secondary | ICD-10-CM | POA: Diagnosis not present

## 2019-01-23 DIAGNOSIS — F0390 Unspecified dementia without behavioral disturbance: Secondary | ICD-10-CM | POA: Insufficient documentation

## 2019-01-23 DIAGNOSIS — H04129 Dry eye syndrome of unspecified lacrimal gland: Secondary | ICD-10-CM | POA: Diagnosis not present

## 2019-01-23 LAB — POCT GLYCOSYLATED HEMOGLOBIN (HGB A1C): Hemoglobin A1C: 5.4 % (ref 4.0–5.6)

## 2019-01-23 MED ORDER — TIZANIDINE HCL 4 MG PO TABS: 2.0000 mg | ORAL_TABLET | Freq: Two times a day (BID) | ORAL | 2 refills | Status: DC | PRN

## 2019-01-23 NOTE — Assessment & Plan Note (Signed)
Problem seems to be stable. Because Zanaflex seems to help, recommend continue 2 to 4 mg twice daily as needed. We discussed some side effects, including the increasing risk for falls when she already has hx of frequent falls. Daughter voices understanding.

## 2019-01-23 NOTE — Assessment & Plan Note (Signed)
Today BP in the office is normal. Some reported BP readings elevated but in general BP seems to be adequately controlled. No changes in current management. We discussed some side effects of antihypertensive medications. Low-salt diet recommended. Continue monitoring BP regularly, stressed the importance of using a appropriate technique.

## 2019-01-23 NOTE — Assessment & Plan Note (Signed)
A1c is at goal. No changes in current management. Caution with hypoglycemic events. If A1c is a stable, next visit we could decrease insulin dose. Eye exam is current. Appropriate foot care also recommended. Follow-up in 4 months.

## 2019-01-23 NOTE — Assessment & Plan Note (Signed)
?    Vascular dementia. We discussed diagnosis, prognosis, and treatment options. Continue following with neurologist.

## 2019-01-23 NOTE — Progress Notes (Signed)
HPI:   [name] is a 73 y.o. female, who is here today with her daughter for chronic disease management.   She was last seen on 10/24/2018.  DM II: Currently she is on Lantus 20 units daily and Novolog sliding scale. BS's 110's-170's, a few in the 80's.  Negative for hypoglycemic events. Negative for polydipsia,polyuria, or polyphagia.  Last HgA1C on 10/24/18 was 8.9.   -Today she is reporting toe involuntary movement. She is not sure duration, frequency, exacerbating or alleviating factors. Her daughter was not aware of this problem and has not noted movement abnormalities. When we address topic later during the visit, she does not remember mentioning it. Negative for seizure episodes. Currently she is on Dilantin 100 mg ER 3 capsules at bedtime. She follows with Dr. Delice Lesch annually.  Her daughter reporting episodes of confusion , mainly at the end of the day. Problem has been going on for over a year but seems to be getting worse. No sudden MS changes. She has not c/o dysuria or changes in urinary frequency. Appetite is "good."  Head CT on 09/20/18: Large old right PCA infarct, stable. Atrophy, chronic microvascular disease. No acute intracranial abnormality.  Bipolar disorder, she is on Lamictal 200 mg tablet 1.5 tablet daily Lower back pain,Zanaflex helps.  Falls since her last visit x 2, no serious injury. She forgets she is not able to walk without assistance.  -HTN: Currently she is on Imdur 30 mg daily, metoprolol tartrate 50 mg twice daily, amlodipine 10 mg daily, spironolactone 50 mg daily, and losartan 50 mg daily.  Lab Results  Component Value Date   CREATININE 0.60 09/20/2018   BUN 6 (L) 09/20/2018   NA 136 09/20/2018   K 4.1 09/20/2018   CL 101 09/20/2018   CO2 24 09/20/2018   BP readings 140's-150's/70's. There is no consistency with following low-salt diet due to be dependent on dietary options at [employer].  He daughter cooks low salt meals for her sometimes. Denies severe/frequent headache, visual changes, chest pain, dyspnea, palpitation, new focal weakness, or edema.  -She is complaining of eye drainage. L>R dry eye and eye pruritus. She has history of dry eye syndrome. She has had tear duct implant to treat dry eye.  According to daughter , her last appt was about 2 months ago. She denies eye pain, visual changes, purulent drainage, or conjunctival erythema. + Photophobia. She has scheduled natural tears drops as needed but according to the daughter, she forgets to ask for drops.   Review of Systems  Constitutional: Positive for fatigue (No more than usual). Negative for activity change, appetite change and fever.  HENT: Negative for mouth sores, nosebleeds and sore throat.   Respiratory: Negative for cough and wheezing.   Gastrointestinal: Negative for abdominal pain, nausea and vomiting.       Negative for changes in bowel habits.  Genitourinary: Negative for decreased urine volume and hematuria.  Musculoskeletal: Positive for gait problem.  Skin: Negative for rash and wound.  Allergic/Immunologic: Positive for environmental allergies.  Neurological: Negative for syncope and facial asymmetry.  Psychiatric/Behavioral: Positive for confusion. The patient is nervous/anxious.   Rest of ROS, see pertinent positives sand negatives in HPI   Current Outpatient Medications on File Prior to Visit  Medication Sig Dispense Refill  . acetaminophen (TYLENOL) 500 MG tablet Take 500 mg by mouth 4 (four) times daily as needed.    Marland Kitchen albuterol (PROVENTIL HFA;VENTOLIN HFA) 108 (  90 Base) MCG/ACT inhaler Inhale 2 puffs into the lungs every 6 (six) hours as needed for wheezing or shortness of breath. 1 Inhaler 6  . amLODipine (NORVASC) 10 MG tablet Take 1 tablet (10 mg total) by mouth daily. 30 tablet 0  . aspirin EC 81 MG tablet Take 81 mg by mouth daily.    . budesonide-formoterol  (SYMBICORT) 160-4.5 MCG/ACT inhaler Inhale 2 puffs into the lungs 2 (two) times daily. 1 Inhaler 0  . Cholecalciferol (VITAMIN D3) 50 MCG (2000 UT) capsule Take 2,000 Units by mouth daily.     . clopidogrel (PLAVIX) 75 MG tablet Take 1 tablet (75 mg total) by mouth daily. 90 tablet 2  . ezetimibe (ZETIA) 10 MG tablet Take 1 tablet (10 mg total) by mouth daily. 90 tablet 3  . ferrous sulfate 325 (65 FE) MG tablet Take 650 mg by mouth daily.     . fluticasone (FLONASE) 50 MCG/ACT nasal spray Place 2 sprays into both nostrils daily. 16 g 6  . furosemide (LASIX) 20 MG tablet Take 1 tablet (20 mg total) by mouth daily as needed for edema. 30 tablet 3  . guaiFENesin (MUCINEX) 600 MG 12 hr tablet Take 600 mg by mouth daily as needed for cough.    . insulin aspart (NOVOLOG FLEXPEN) 100 UNIT/ML FlexPen Sliding scale: BS<150 no insulin, 151-180 give 4U,181-220 give 6U,221-260 give 8U,261-300 give 10U,301-340 give 12 U,and > 341 give 14 u. 15 mL 2  . Insulin Glargine (LANTUS SOLOSTAR) 100 UNIT/ML Solostar Pen Inject 20 Units into the skin at bedtime. 15 mL 3  . isosorbide mononitrate (IMDUR) 30 MG 24 hr tablet Take 30 mg by mouth daily.     Marland Kitchen lamoTRIgine (LAMICTAL) 200 MG tablet Take 1.5 tablets (300 mg total) by mouth See admin instructions. Take 1 1/2 tablets (300 mg) by mouth twice daily - 8am and 2pm 270 tablet 3  . losartan (COZAAR) 50 MG tablet Take 1 tablet (50 mg total) by mouth daily. 90 tablet 1  . metoprolol tartrate (LOPRESSOR) 50 MG tablet TAKE 1 TAB BY MOUTH TWICE DAILY 60 tablet 3  . Omega-3 Fatty Acids (FISH OIL) 1000 MG CAPS Take 2,000 mg by mouth daily.    . phenytoin (DILANTIN) 100 MG ER capsule T 3 CAPS (300MG ) PO HS ND (Patient taking differently: Take 300 mg by mouth at bedtime. ) 270 capsule 3  . phenytoin (DILANTIN) 30 MG ER capsule Take 1 capsule (30 mg total) by mouth at bedtime. 30 capsule 0  . rosuvastatin (CRESTOR) 40 MG tablet Take 40 mg by mouth daily.    Marland Kitchen Spacer/Aero Chamber  Mouthpiece MISC 1 Device by Does not apply route daily as needed. 1 each 0  . spironolactone (ALDACTONE) 50 MG tablet Take 1 tablet (50 mg total) by mouth daily. 30 tablet 11  . tiotropium (SPIRIVA HANDIHALER) 18 MCG inhalation capsule Place 1 capsule (18 mcg total) into inhaler and inhale daily. 90 capsule 2   No current facility-administered medications on file prior to visit.    Past Medical History:  Diagnosis Date  . Allergy   . Arthritis   . Bipolar 1 disorder ([place])   . Blood transfusion without reported diagnosis   . Carotid artery stenosis    50-69% bilateral ICA stenoses by velocity criteria but no visible plaque and ICA/CCA ratio 1.72 on right and 1.38 on left  . Coronary artery disease   . Depression   . Diabetes mellitus without complication ([place])   .  Echocardiogram    Echo 06/2018: Hyperdynamic systolic function, EF 99991111, mild concentric LVH, elevated LVEDP (E/E' suggests impaired relaxation), mild AI, lipomatous interatrial septum  . Hypertension   . Left adrenal mass (HCC)    2.5 cm L adrenal mass on CT per note of Dr. Seleta Rhymes 05/20/2014  . MI, old   . PAD (peripheral artery disease) ([place])    03/2014 - CT aortogram with lower extremity runoff (mild to moderate disease in common iliac arteries, complete occulusion of her bilateral external iliac arteries with heavily disease common femoral arteries. She also has disease to her SFAs bilaterally. Popliteal arteries and tibial vessel runoff appears to be adequate)  . Seizures ([place])   . Stroke [facility])    Allergies  Allergen Reactions  . Influenza Vaccines Anaphylaxis  . Amoxicillin Other (See Comments)    colitis  . Augmentin [Amoxicillin-Pot Clavulanate] Other (See Comments)    colitis  . Cephalexin Other (See Comments)    colitis  . Erythromycin Other (See Comments)    colitis  . Niacin And Related Other (See Comments)    colitis  . Cefadroxil Other (See Comments)    Reported by [facility] 2013 - unknown reaction  .  Nitrofurantoin Macrocrystal Other (See Comments)    Reported by [facility] 2013 - unknown reaction    Social History   Socioeconomic History  . Marital status: Single    Spouse name: Not on file  . Number of children: Not on file  . Years of education: Not on file  . Highest education level: Not on file  Occupational History  . Not on file  Tobacco Use  . Smoking status: Former Smoker    Packs/day: 1.00    Types: Cigarettes    Quit date: 2015    Years since quitting: 5.9  . Smokeless tobacco: Never Used  Substance and Sexual Activity  . Alcohol use: No  . Drug use: No  . Sexual activity: Not on file  Other Topics Concern  . Not on file  Social History Narrative   Pt lives in assisted living facility   Has 2 adult children   College   Worked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   Social Determinants of Health   Financial Resource Strain:   . Difficulty of Paying Living Expenses: Not on file  Food Insecurity:   . Worried About Charity fundraiser in the Last Year: Not on file  . Ran Out of Food in the Last Year: Not on file  Transportation Needs:   . Lack of Transportation (Medical): Not on file  . Lack of Transportation (Non-Medical): Not on file  Physical Activity:   . Days of Exercise per Week: Not on file  . Minutes of Exercise per Session: Not on file  Stress:   . Feeling of Stress : Not on file  Social Connections:   . Frequency of Communication with Friends and Family: Not on file  . Frequency of Social Gatherings with Friends and Family: Not on file  . Attends Religious Services: Not on file  . Active Member of Clubs or Organizations: Not on file  . Attends Archivist Meetings: Not on file  . Marital Status: Not on file    Vitals:   01/23/19 1157  BP: 128/70  Pulse: 63  Resp: 12  Temp: (!) 95.9 F (35.5 C)  SpO2: 99%   Body mass index is 25.89 kg/m.   Physical Exam  Nursing note and vitals reviewed. Constitutional:  She appears  well-developed and well-nourished. No distress.  HENT:  Head: Normocephalic and atraumatic.  Eyes: Pupils are equal, round, and reactive to light. Conjunctivae are normal.  Cardiovascular: Normal rate and regular rhythm.  No murmur heard. Pulses:      Dorsalis pedis pulses are 2+ on the right side and 2+ on the left side.  Respiratory: Effort normal and breath sounds normal. No respiratory distress.  GI: Soft. There is no abdominal tenderness.  Musculoskeletal:        General: No edema.  Lymphadenopathy:    She has no cervical adenopathy.  Neurological: She is alert. She has normal strength. She displays no tremor. No cranial nerve deficit.  In a wheel chair. Oriented in place and person.  Skin: Skin is warm. No rash noted. No erythema.  Psychiatric: Her mood appears anxious. Cognition and memory are impaired. She exhibits abnormal recent memory.  Well groomed, good eye contact.    ASSESSMENT AND PLAN:   Ms. Shayanne Vader was seen today for chronic disease management.  Orders Placed This Encounter  Procedures  . POC HgB A1c    Diabetes mellitus type 2 with neurological manifestations (HCC) A1c is at goal. No changes in current management. Caution with hypoglycemic events. If A1c is a stable, next visit we could decrease insulin dose. Eye exam is current. Appropriate foot care also recommended. Follow-up in 4 months.  Hypertension with heart disease Today BP in the office is normal. Some reported BP readings elevated but in general BP seems to be adequately controlled. No changes in current management. We discussed some side effects of antihypertensive medications. Low-salt diet recommended. Continue monitoring BP regularly, stressed the importance of using a appropriate technique.     Dementia without behavioral disturbance ([place]) ?  Vascular dementia. We discussed diagnosis, prognosis, and treatment options. Continue following with neurologist.  Peripheral  polyneuropathy Fall precautions. Appropriate foot/LE skin care, trauma prevention. Following with neurologist.  Back pain Problem seems to be stable. Because Zanaflex seems to help, recommend continue 2 to 4 mg twice daily as needed. We discussed some side effects, including the increasing risk for falls when she already has hx of frequent falls. Daughter voices understanding.  Dry eye Examination today does not suggest a serious process. We will schedule application of natural tears, 2 drops every 4 hours. Clearly instructed about warning signs. If problem is persistent, her daughter needs to arrange appointment with her eye care provider.   In regard to toes movement, this could be related to PAD or neuropathy. It does not seem to be seizures. It is difficult to obtain information. Daughter will continue monitoring. Keep next appt with neuro, 03/12/2019.  Return in about 4 months (around 05/24/2019).   Gill Delrossi G. [place]ique, MD  [facility]. [place] office.

## 2019-01-23 NOTE — Patient Instructions (Addendum)
A few things to remember from today's visit:   Diabetes mellitus type 2 with neurological manifestations ([place]) - Plan: POC HgB A1c  Peripheral polyneuropathy  Hypertension with heart disease  Dry eye  Natural tears every 4 hours in both eyes. Fall precautions. No changes I rest of meds.  Please be sure medication list is accurate. If a new problem present, please set up appointment sooner than planned today.

## 2019-01-23 NOTE — Assessment & Plan Note (Signed)
Fall precautions. Appropriate foot/LE skin care, trauma prevention. Following with neurologist.

## 2019-01-24 ENCOUNTER — Telehealth: Payer: Self-pay

## 2019-01-24 NOTE — Telephone Encounter (Signed)
[facility] needs Rx for tizandine resent. They cannot refill the Rx if it says "0.5-1" tablets, it has to be either 0.5 tablet or 1 tablet.

## 2019-01-30 ENCOUNTER — Other Ambulatory Visit: Payer: Self-pay | Admitting: Family Medicine

## 2019-01-30 DIAGNOSIS — M545 Low back pain, unspecified: Secondary | ICD-10-CM

## 2019-01-30 DIAGNOSIS — G8929 Other chronic pain: Secondary | ICD-10-CM

## 2019-01-30 MED ORDER — TIZANIDINE HCL 4 MG PO TABS: 2.0000 mg | ORAL_TABLET | Freq: Two times a day (BID) | ORAL | 2 refills | Status: DC | PRN

## 2019-01-30 NOTE — Telephone Encounter (Signed)
Prescription for Zanaflex was resent: 2 mg twice daily as needed. Thanks, BJ

## 2019-02-01 DIAGNOSIS — N390 Urinary tract infection, site not specified: Secondary | ICD-10-CM | POA: Diagnosis not present

## 2019-02-01 DIAGNOSIS — R319 Hematuria, unspecified: Secondary | ICD-10-CM | POA: Diagnosis not present

## 2019-02-01 DIAGNOSIS — R69 Illness, unspecified: Secondary | ICD-10-CM | POA: Diagnosis not present

## 2019-02-06 ENCOUNTER — Other Ambulatory Visit: Payer: Self-pay

## 2019-02-14 ENCOUNTER — Other Ambulatory Visit: Payer: Self-pay | Admitting: Family Medicine

## 2019-02-14 NOTE — Telephone Encounter (Signed)
Okay to refill? I don't see where you've filled this before.

## 2019-02-15 ENCOUNTER — Other Ambulatory Visit: Payer: Self-pay | Admitting: Family Medicine

## 2019-02-16 NOTE — Telephone Encounter (Signed)
I left a message for [name] to return my call.

## 2019-02-16 NOTE — Telephone Encounter (Signed)
Can you please ask her daughter if she agrees with stopping med and re-check CBC in a few months. Last CBC did not show anemia. Thanks, BJ

## 2019-02-19 ENCOUNTER — Telehealth: Payer: Self-pay | Admitting: Family Medicine

## 2019-02-19 ENCOUNTER — Other Ambulatory Visit: Payer: Self-pay | Admitting: Family Medicine

## 2019-02-19 DIAGNOSIS — R350 Frequency of micturition: Secondary | ICD-10-CM

## 2019-02-19 DIAGNOSIS — F0390 Unspecified dementia without behavioral disturbance: Secondary | ICD-10-CM

## 2019-02-19 NOTE — Telephone Encounter (Signed)
Patients daughter Tekeya Koegler wanting to know if Dr. Martinique can in medication for the patient because Dr. Martinique called in an antibiotic for UTI and the paitnet is still having symptoms after the medication is completed.   If she needs to schedule an appointment it will have to be Tuesday.   Please contact Trevi Revolorio at [phone] if you have any questions.   If Dr. Martinique is able to send in an antibiotic please send it to:   [facility]

## 2019-02-19 NOTE — Telephone Encounter (Signed)
I do not recall prescribing abx. I signed an order for UA and Ucx, have not seen results. Is she having urinary symptoms or a new behavior change. Can we obtain copy of UA and Ucx?  Thanks, BJ

## 2019-02-19 NOTE — Telephone Encounter (Signed)
I called and spoke with [facility]. A ua & culture was done on 1/7; showed 5-10 wbc & trace bacteria. An antibiotic was sent in by [place] and pt completed it. She is not having any new urinary symptoms, but they did say that she isn't acting like her normal self and having some hallucinations. Do you want to see her or do you want me to see if they can do another ua and culture?

## 2019-02-21 ENCOUNTER — Other Ambulatory Visit: Payer: Self-pay | Admitting: *Deleted

## 2019-02-21 NOTE — Patient Outreach (Signed)
  [facility] [facility]) Care Management Chronic Special Needs Program    02/21/2019  Name: Kathryn Lewis, DOB: [birth year]  MRN: [id number]   Kathryn Lewis is enrolled in a chronic special needs plan for Diabetes and Heart Failure. Reached client's daughter and legal guardian Kathryn Lewis via mobile number. Explained purpose of call, to completed initial intake assessment. Kathryn Lewis requests call back tomorrow at 10:30 am.  Plan: Will return call to Kathryn Lewis tomorrow at 10:30 am to completed client's initial assessment.   Kelli Churn RN, CCM, [place] Management Coordinator [facility] Care Management [phone]

## 2019-02-22 ENCOUNTER — Encounter: Payer: Self-pay | Admitting: *Deleted

## 2019-02-22 ENCOUNTER — Other Ambulatory Visit: Payer: Self-pay | Admitting: *Deleted

## 2019-02-22 NOTE — Patient Outreach (Deleted)
[facility]) Care Management Chronic Special Needs Program  02/22/2019  Name: Kathryn Lewis DOB: 1945/06/23  MRN: [id number]  Ms. Kathryn Lewis is enrolled in a chronic special needs plan for Diabetes and Heart Failure. Chronic Care Management Coordinator telephoned client to review health risk assessment and to develop individualized care plan.  Introduced the chronic care management program, importance of client participation, and taking their care plan to all provider appointments and inpatient facilities.  Reviewed the transition of care process and possible referral to community care management.  Subjective: Reached client's daughter and legal guardian Kathryn Lewis and initial assessment completed. Ms Kathryn Lewis states the client lives at [facility] and is under the care of [facility].  She says her Mom has fallen when she tries to go to the bathroom without assistance.  She says a stroke in 2015 affected her Mom's vision so she is no longer able to read. She says her Mom tested positive for Covid in early December 2020 but did not exhibit symptoms. Ms Kathryn Lewis also says she thinks her mom is developing dementia. 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  Goals Addressed            This Visit's Progress   . Advanced Care Planning complete by March 2021   On track    Documents have been completed and daughter is client's legal guardian Do Not Resuscitate form completed and on chart  Client is receiving services from [facility] with goals of enhancing quality of life and symptom management- most recent facility visit was completed on 12/01/18    . Client will have ADL/IADL needs addressed by March 2021   On track    Client resides in assisted living facility and  staff assists with ADLs and daughter and staff assist with IADLS    . Client's daughter will report client sustains no fall or injuries in the next 3 months.       Daughter states client has fallen at the assisted living facility when she tries to get to the bathroom and cannot wait for the staff to assist her Discussed fall prevention strategies with client's daughter    . Client's daughter understands the importance of follow-up with providers by attending scheduled visits       Client's daughter states she accompanies her Mom to her provider's appointments, she also says the assisted living facility will provide transportation and accompany client to her provider appointments if she is not available to go with her mother    . Hgb A1C <7 or as defined by provider.   On track    Hgb A1C= 5.4% on 01/23/19    . Obtain annual  Lipid Profile, LDL-C   Not on track    Chart review reveal most recent lipid profile completed on 09/09/17    . Obtain Annual Eye (retinal)  Exam    On track    Diabetic eye exam was completed on 11/08/18    . Obtain Annual Foot Exam   On track    Diabetic foot exam was completed on 10/24/18    . Obtain Hemoglobin A1C at least 2 times per year   On track    Hgb A1C was completed on 01/23/19 and 10/24/18    . Visit Primary  Care Provider or Endocrinologist at least 2 times per year   On track    Client completed visits with primary care provider on 10/24/18 and 01/23/19      Assessment: Client is meeting diabetes self-management goal of hemoglobin A1C of <7% with most recent reading of 5.4% on 01/23/19 without reports of hypoglycemia per client's daughter .  Plan:   Send successful outreach letter with a copy of their individualized care plan to client's daughter and send individual care plan to provider Chronic care management coordination will outreach in:  3 Months   Kelli Churn RN, CCM, [place] Management Coordinator [place]  Management [phone]

## 2019-02-23 ENCOUNTER — Non-Acute Institutional Stay: Payer: HMO | Admitting: Hospice

## 2019-02-23 ENCOUNTER — Encounter: Payer: Self-pay | Admitting: *Deleted

## 2019-02-23 ENCOUNTER — Other Ambulatory Visit: Payer: Self-pay

## 2019-02-23 DIAGNOSIS — F0390 Unspecified dementia without behavioral disturbance: Secondary | ICD-10-CM

## 2019-02-23 DIAGNOSIS — Z515 Encounter for palliative care: Secondary | ICD-10-CM

## 2019-02-23 NOTE — Patient Outreach (Addendum)
[facility] [facility]) Care Management Chronic Special Needs Program  02/23/2019  Name: Kathryn Lewis DOB: 1945/04/04  MRN: [id number]  Ms. Elzada Thien is enrolled in a chronic special needs plan for Diabetes and Heart Failure. Chronic Care Management Coordinator telephoned client to review health risk assessment and to develop individualized care plan.  Introduced the chronic care management program, importance of client participation, and taking their care plan to all provider appointments and inpatient facilities.  Reviewed the transition of care process and possible referral to community care management.  Subjective: Reached client's daughter and legal guardian Maire Rzepecki and initial assessment completed. Ms Everhart states the client lives at [facility] and is under the care of [facility].  She says her Mom has fallen when she tries to go to the bathroom without assistance.  She says a stroke in 2015 affected her Mom's vision so she is no longer able to read. She says her Mom tested positive for Covid in early December 2020 but did not exhibit symptoms. Ms Crymes also says she thinks her mom is developing dementia. 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  Goals Addressed            This Visit's Progress   . Advanced Care Planning complete by March 2021   On track    Documents have been completed and daughter is client's legal guardian Do Not Resuscitate form completed and on chart  Client is receiving services from [facility] with goals of enhancing quality of life and symptom management- most recent facility visit was completed on 12/01/18    . Client will have ADL/IADL needs addressed by March 2021   On track    Client resides in assisted living facility and  staff assists with ADLs and daughter and staff assist with IADLS    . Client's daughter will report client sustains no fall or injuries in the next 3 months.       Daughter states client has fallen at the assisted living facility when she tries to get to the bathroom and cannot wait for the staff to assist her Discussed fall prevention strategies with client's daughter    . Client's daughter understands the importance of follow-up with providers by attending scheduled visits       Client's daughter states she accompanies her Mom to her provider's appointments, she also says the assisted living facility will provide transportation and accompany client to her provider appointments if she is not available to go with her mother    . Hgb A1C <7 or as defined by provider.   On track    Hgb A1C= 5.4% on 01/23/19    . Obtain annual  Lipid Profile, LDL-C   Not on track    Chart review reveal most recent lipid profile completed on 09/09/17    . Obtain Annual Eye (retinal)  Exam    On track    Diabetic eye exam was completed on 11/08/18    . Obtain Annual Foot Exam   On track    Diabetic foot exam was completed on 10/24/18    . Obtain Hemoglobin A1C at least 2 times per year   On track    Hgb A1C was completed on 01/23/19 and 10/24/18    . Visit Primary  Care Provider or Endocrinologist at least 2 times per year   On track    Client completed visits with primary care provider on 10/24/18 and 01/23/19      Assessment: Client is meeting diabetes self-management goal of hemoglobin A1C of <7% with most recent reading of 5.4% on 01/23/19 without reports of hypoglycemia per client's daughter .  Plan:   Send successful outreach letter with a copy of their individualized care plan to client's daughter and send individual care plan to provider Chronic care management coordination will outreach in:  3 Months   Kelli Churn RN, CCM, [place] Management Coordinator [place]  Management [phone]

## 2019-02-23 NOTE — Addendum Note (Signed)
Addended by: Rodrigo Ran on: 02/23/2019 04:19 PM   Modules accepted: Orders

## 2019-02-23 NOTE — Telephone Encounter (Signed)
She has Hx of dementia, so this could explain some of her behavior. Urine can be frequently contaminated during collection, UA-Ucx can be done but I also want a CBC and BMP. Thanks, BJ

## 2019-02-23 NOTE — Telephone Encounter (Signed)
Spoke with [facility], I will fax orders over to them and they will take care of it.

## 2019-02-23 NOTE — Progress Notes (Signed)
[facility] Consult Note Telephone: [phone]  Fax: [phone]  PATIENT NAME: Kathryn Lewis DOB: February 25, 1945 MRN: [id number]  PRIMARY CARE PROVIDER:   Martinique, Betty G, MD  REFERRING PROVIDER:  Martinique, Betty G, MD [place],  [place] [address]  RESPONSIBLE PARTY:[name] Self (daughter) [phone]  RECOMMENDATIONS/PLAN: Patient is a DNR; Goals of care include to maximize quality of life and symptom management. 2. Symptom management:Patient continues on Lantus and Novolog with no adverse reports; hyperglycemia well managed.  Last reported A1c 8.9 10/24/2018. No concentratedsweets. Nursing staff to Initiate hypoglycemic/hyperglycemic protocol per facility, if indicated. Patient is ambulatory with her rolling walker. Patient denies pain/discomfort, in no acute distress. Nursing with no concerns. Encouraged ongoing nursing care.  3. Follow up Palliative Care Visit: Palliative care will continue to follow for goals of care clarification and symptom management. I spent69minutes providing this consultation, from 11.30am to 12.00pmMore than 50% of the time in this consultation was spent coordinating communication.  HISTORY OF PRESENT ILLNESS:Kathryn Lewis a 67year oldfemalewith multiple medical problems including HTN,Type 2 DM,HLD, CAD, s/pCVA, gait disorder, STML, COPD, OSA. Palliative Care was asked to help address goals of care.care.   CODE STATUS: DNR  PPS: 50% HOSPICE ELIGIBILITY/DIAGNOSIS: TBD  PAST MEDICAL HISTORY:  Past Medical History:  Diagnosis Date  . Allergy   . Arthritis   . Bipolar 1 disorder ([place])   . Blood transfusion without reported diagnosis   . Carotid artery stenosis    50-69% bilateral ICA stenoses by velocity criteria but no visible plaque and ICA/CCA ratio 1.72 on right and 1.38 on left  . Coronary artery disease   . Depression   . Diabetes mellitus without  complication ([place])   . Echocardiogram    Echo 06/2018: Hyperdynamic systolic function, EF 99991111, mild concentric LVH, elevated LVEDP (E/E' suggests impaired relaxation), mild AI, lipomatous interatrial septum  . Hypertension   . Left adrenal mass (HCC)    2.5 cm L adrenal mass on CT per note of Dr. Seleta Rhymes 05/20/2014  . MI, old   . PAD (peripheral artery disease) ([place])    03/2014 - CT aortogram with lower extremity runoff (mild to moderate disease in common iliac arteries, complete occulusion of her bilateral external iliac arteries with heavily disease common femoral arteries. She also has disease to her SFAs bilaterally. Popliteal arteries and tibial vessel runoff appears to be adequate)  . Seizures ([place])   . Stroke [facility])     SOCIAL HX:  Social History   Tobacco Use  . Smoking status: Former Smoker    Packs/day: 1.00    Types: Cigarettes    Quit date: 2015    Years since quitting: 6.0  . Smokeless tobacco: Never Used  Substance Use Topics  . Alcohol use: No    ALLERGIES:  Allergies  Allergen Reactions  . Influenza Vaccines Anaphylaxis  . Amoxicillin Other (See Comments)    colitis  . Augmentin [Amoxicillin-Pot Clavulanate] Other (See Comments)    colitis  . Cephalexin Other (See Comments)    colitis  . Erythromycin Other (See Comments)    colitis  . Niacin And Related Other (See Comments)    colitis  . Cefadroxil Other (See Comments)    Reported by [facility] 2013 - unknown reaction  . Nitrofurantoin Macrocrystal Other (See Comments)    Reported by [facility] 2013 - unknown reaction     PERTINENT MEDICATIONS:  Outpatient Encounter Medications as of 02/23/2019  Medication  Sig  . acetaminophen (TYLENOL) 500 MG tablet Take 500 mg by mouth 4 (four) times daily as needed.  Marland Kitchen albuterol (PROVENTIL HFA;VENTOLIN HFA) 108 (90 Base) MCG/ACT inhaler Inhale 2 puffs into the lungs every 6 (six) hours as needed for wheezing or shortness of breath.  Marland Kitchen amLODipine (NORVASC) 10 MG  tablet Take 1 tablet (10 mg total) by mouth daily.  Marland Kitchen aspirin EC 81 MG tablet Take 81 mg by mouth daily.  . budesonide-formoterol (SYMBICORT) 160-4.5 MCG/ACT inhaler Inhale 2 puffs into the lungs 2 (two) times daily.  . Cholecalciferol (VITAMIN D3) 50 MCG (2000 UT) capsule Take 2,000 Units by mouth daily.   . clopidogrel (PLAVIX) 75 MG tablet Take 1 tablet (75 mg total) by mouth daily.  Marland Kitchen ezetimibe (ZETIA) 10 MG tablet TAKE 1 TAB BY MOUTH EVERY DAY  . fluticasone (FLONASE) 50 MCG/ACT nasal spray Place 2 sprays into both nostrils daily.  . furosemide (LASIX) 20 MG tablet Take 1 tablet (20 mg total) by mouth daily as needed for edema.  Marland Kitchen guaiFENesin (MUCINEX) 600 MG 12 hr tablet Take 600 mg by mouth daily as needed for cough.  . insulin aspart (NOVOLOG FLEXPEN) 100 UNIT/ML FlexPen Sliding scale: BS<150 no insulin, 151-180 give 4U,181-220 give 6U,221-260 give 8U,261-300 give 10U,301-340 give 12 U,and > 341 give 14 u.  . Insulin Glargine (LANTUS SOLOSTAR) 100 UNIT/ML Solostar Pen Inject 20 Units into the skin at bedtime.  . isosorbide mononitrate (IMDUR) 30 MG 24 hr tablet Take 30 mg by mouth daily.   Marland Kitchen lamoTRIgine (LAMICTAL) 200 MG tablet Take 1.5 tablets (300 mg total) by mouth See admin instructions. Take 1 1/2 tablets (300 mg) by mouth twice daily - 8am and 2pm  . losartan (COZAAR) 50 MG tablet Take 1 tablet (50 mg total) by mouth daily.  . metoprolol tartrate (LOPRESSOR) 50 MG tablet TAKE 1 TAB BY MOUTH TWICE DAILY  . Omega-3 Fatty Acids (FISH OIL) 1000 MG CAPS Take 2,000 mg by mouth daily.  . phenytoin (DILANTIN) 100 MG ER capsule T 3 CAPS (300MG ) PO HS ND (Patient taking differently: Take 300 mg by mouth at bedtime. )  . phenytoin (DILANTIN) 30 MG ER capsule Take 1 capsule (30 mg total) by mouth at bedtime.  . rosuvastatin (CRESTOR) 40 MG tablet Take 40 mg by mouth daily.  Marland Kitchen Spacer/Aero Chamber Mouthpiece MISC 1 Device by Does not apply route daily as needed.  Marland Kitchen spironolactone (ALDACTONE) 50  MG tablet Take 1 tablet (50 mg total) by mouth daily.  Marland Kitchen tiotropium (SPIRIVA HANDIHALER) 18 MCG inhalation capsule Place 1 capsule (18 mcg total) into inhaler and inhale daily.  Marland Kitchen tiZANidine (ZANAFLEX) 4 MG tablet Take 0.5 tablets (2 mg total) by mouth every 12 (twelve) hours as needed for muscle spasms.   No facility-administered encounter medications on file as of 02/23/2019.    PHYSICAL EXAM/ROM:   General:cooperative, not in acute distress Cardiovascular: regular rate and rhythm Pulmonary:normal respiratory effort, no SOB Abdomen: soft, nontender, + bowel soundsin all quadrants GU: no suprapubic tenderness Extremities: no edema, no joint deformities Skin: no rasheson exposed skin Neurological: Weakness otherwise nonfocal, forgetful  Teodoro Spray, NP

## 2019-02-23 NOTE — Telephone Encounter (Signed)
Spoke with pt's daughter and she is aware we are waiting on results.

## 2019-03-02 ENCOUNTER — Other Ambulatory Visit: Payer: Self-pay | Admitting: Family Medicine

## 2019-03-02 DIAGNOSIS — I2583 Coronary atherosclerosis due to lipid rich plaque: Secondary | ICD-10-CM

## 2019-03-02 DIAGNOSIS — I2510 Atherosclerotic heart disease of native coronary artery without angina pectoris: Secondary | ICD-10-CM

## 2019-03-02 MED ORDER — CLOPIDOGREL BISULFATE 75 MG PO TABS: 75.0000 mg | ORAL_TABLET | Freq: Every day | ORAL | 2 refills | Status: DC

## 2019-03-06 ENCOUNTER — Telehealth: Payer: Self-pay | Admitting: Internal Medicine

## 2019-03-06 NOTE — Telephone Encounter (Signed)
I called and spoke with patients daughter, she is ok that she can come with patient to appointment. Note made.

## 2019-03-06 NOTE — Telephone Encounter (Signed)
New Message    Pts daughter says she will need to assist the pt to her appt because she uses a wheel chair    Please advise

## 2019-03-07 ENCOUNTER — Other Ambulatory Visit: Payer: Self-pay | Admitting: *Deleted

## 2019-03-07 NOTE — Patient Outreach (Signed)
  [place] [facility]) Care Management Chronic Special Needs Program   03/07/2019  Name: Kathryn Lewis, DOB: [birth year]  MRN: [id number]  The client was discussed in today's interdisciplinary care team meeting.  The following issues were discussed:  Key risk triggers/risk stratification and Care Plan  Participants present:  Thea Silversmith, MSN, RN, CCM  Melissa Sandlin RN,BSN,CCM, CDCES  Kelli Churn, RN, CCM, CDCES Quinn Plowman RN, BSN, CCM  Maryella Shivers, MD  Bary Castilla, RN, BSN, MS, CCM Coralie Carpen, MD [place] Team: Babs Bertin, RN; Teresa Pelton, RN, BSN  Recommendations: With most recent Hgb A1C= 5.4%, concern for hypoglycemia although none reported. Primary care provider note of 01/23/19 states she will consider reducing insulin dose at next office visit.   Follow-up:RNCM will follow up with client per [place] tier level.  Kelli Churn RN, CCM, [place] Management Coordinator [facility] Care Management [phone]

## 2019-03-08 DIAGNOSIS — R69 Illness, unspecified: Secondary | ICD-10-CM | POA: Diagnosis not present

## 2019-03-08 DIAGNOSIS — D649 Anemia, unspecified: Secondary | ICD-10-CM | POA: Diagnosis not present

## 2019-03-08 DIAGNOSIS — R319 Hematuria, unspecified: Secondary | ICD-10-CM | POA: Diagnosis not present

## 2019-03-08 DIAGNOSIS — N390 Urinary tract infection, site not specified: Secondary | ICD-10-CM | POA: Diagnosis not present

## 2019-03-09 ENCOUNTER — Encounter: Payer: Self-pay | Admitting: Neurology

## 2019-03-12 ENCOUNTER — Telehealth (INDEPENDENT_AMBULATORY_CARE_PROVIDER_SITE_OTHER): Payer: HMO | Admitting: Neurology

## 2019-03-12 ENCOUNTER — Other Ambulatory Visit: Payer: Self-pay

## 2019-03-12 ENCOUNTER — Encounter: Payer: Self-pay | Admitting: Neurology

## 2019-03-12 VITALS — Ht 61.0 in | Wt 130.0 lb

## 2019-03-12 DIAGNOSIS — Z5329 Procedure and treatment not carried out because of patient's decision for other reasons: Secondary | ICD-10-CM

## 2019-03-12 NOTE — Progress Notes (Signed)
Patient was not with daughter, r/s to 03/13/2019

## 2019-03-13 ENCOUNTER — Encounter: Payer: Self-pay | Admitting: Physician Assistant

## 2019-03-13 ENCOUNTER — Ambulatory Visit: Payer: HMO | Admitting: Physician Assistant

## 2019-03-13 VITALS — BP 108/50 | HR 70 | Ht 61.0 in | Wt 128.0 lb

## 2019-03-13 DIAGNOSIS — I2510 Atherosclerotic heart disease of native coronary artery without angina pectoris: Secondary | ICD-10-CM | POA: Diagnosis not present

## 2019-03-13 DIAGNOSIS — I779 Disorder of arteries and arterioles, unspecified: Secondary | ICD-10-CM

## 2019-03-13 DIAGNOSIS — I10 Essential (primary) hypertension: Secondary | ICD-10-CM | POA: Diagnosis not present

## 2019-03-13 DIAGNOSIS — E782 Mixed hyperlipidemia: Secondary | ICD-10-CM | POA: Diagnosis not present

## 2019-03-13 DIAGNOSIS — Z66 Do not resuscitate: Secondary | ICD-10-CM

## 2019-03-13 DIAGNOSIS — I739 Peripheral vascular disease, unspecified: Secondary | ICD-10-CM

## 2019-03-13 MED ORDER — AMLODIPINE BESYLATE 5 MG PO TABS: 5.0000 mg | ORAL_TABLET | Freq: Every day | ORAL | 3 refills | Status: DC

## 2019-03-13 NOTE — Patient Instructions (Addendum)
Medication Instructions:   Your physician has recommended you make the following change in your medication:   1) Decrease your Amlodipine to 5 mg , 1 tablet by mouth once a day  *If you need a refill on your cardiac medications before your next appointment, please call your pharmacy*  Lab Work:  None ordered today  If you have labs (blood work) drawn today and your tests are completely normal, you will receive your results only by: Marland Kitchen MyChart Message (if you have MyChart) OR . A paper copy in the mail If you have any lab test that is abnormal or we need to change your treatment, we will call you to review the results.  Testing/Procedures:  None ordered today  Follow-Up: At [facility], you and your health needs are our priority.  As part of our continuing mission to provide you with exceptional heart care, we have created designated Provider Care Teams.  These Care Teams include your primary Cardiologist (physician) and Advanced Practice Providers (APPs -  Physician Assistants and Nurse Practitioners) who all work together to provide you with the care you need, when you need it.  Your next appointment:   12 month(s)  The format for your next appointment:   In Person  Provider:   You may see Dorris Carnes, MD or one of the following Advanced Practice Providers on your designated Care Team:    Richardson Dopp, PA-C  Vin [place], [place]  Daune Perch, [place]

## 2019-03-13 NOTE — Progress Notes (Signed)
Cardiology Office Note:    Date:  03/13/2019   ID:  Kathryn, Lewis 1945/03/25, MRN [id number]  PCP:  Kathryn, Betty G, MD  Cardiologist:  Kathryn Carnes, MD  Electrophysiologist:  None   Referring MD: Kathryn, Betty G, MD   Chief Complaint:  No chief complaint on file.    Patient Profile:    Kathryn Lewis is a 74 y.o. female with:   Coronary artery disease  S/p prior MI, previously followed by cardiology in [place], [place]  Est w/ Dr. Harrington Lewis during admx for L arm pain and HTN urgency in 2017  Prior cath (date unknown): LAD - p50, m80, d90; D1 p60, d80; mLCx 60, mOM1 80, RCA - p20, m70, d20, EF 26  Prior CVA  Subsequent seizures (Dr. Delice Lewis)  Peripheral arterial disease  CTA from prior Cardiologist in [facility] (Dr. Harrell Gave Lewis)>>mild to mod dz in CIA, bilat EIA occluded, bilat SFA dz  Notes indicate she is not a candidate for intervention (per [employer], [place])  Carotid artery disease  [place] (date unknown): Bilat 50-69  Diabetes mellitus  Hypertension  Hyperlipidemia  Bipolar d/o  COPD  Prior CV studies:  Echocardiogram 06/29/2018 EF >65, mild concentric LVH, normal RV SF, mild AI, mild calcification of aortic valve  Echocardiogram 04/18/2015 EF 55-60, normal wall motion, grade 1 diastolic dysfunction, mild MR  Carotid US (date unknown) Bilateral ICA 50-69  Cardiac catheterization (date unknown - ?2015) at [facility], [place] LM normal LAD proximal 50, mid 80, distal 90; D1 proximal 60, distal 80 LCx mid 40; OM 2 mid 80 RCA proximal 20, mid 70, distal 20 EF 65  Echo 12/11 EF 60-65  History of Present Illness:    [name] was last seen in clinic by me in 03/2018.  We still did not have adequate records from her prior cardiologist. It does not appear that we have received any records in her chart.  At last visit, her daughter had a picture of the notes from the cath done in the past which demonstrated diffuse 3 v CAD.     She returns for  follow-up.  She is here with her daughter.  She lives at [facility].  She has significant claudication with minimal ambulation.  Her symptoms are overall stable.  She has not had any nonhealing ulcers.  She has not had any rest pain.  She has not had chest discomfort.  She has chronic shortness of breath.  She has not had syncope but has been dizzy at times.  Past Medical History:  Diagnosis Date  . Allergy   . Arthritis   . Bipolar 1 disorder ([place])   . Blood transfusion without reported diagnosis   . Carotid artery stenosis    50-69% bilateral ICA stenoses by velocity criteria but no visible plaque and ICA/CCA ratio 1.72 on right and 1.38 on left  . Coronary artery disease   . Depression   . Diabetes mellitus without complication ([place])   . Echocardiogram    Echo 06/2018: Hyperdynamic systolic function, EF 99991111, mild concentric LVH, elevated LVEDP (E/E' suggests impaired relaxation), mild AI, lipomatous interatrial septum  . Hypertension   . Left adrenal mass (HCC)    2.5 cm L adrenal mass on CT per note of Dr. Seleta Lewis 05/20/2014  . MI, old   . PAD (peripheral artery disease) ([place])    03/2014 - CT aortogram with lower extremity runoff (mild to moderate disease in common iliac arteries, complete  occulusion of her bilateral external iliac arteries with heavily disease common femoral arteries. She also has disease to her SFAs bilaterally. Popliteal arteries and tibial vessel runoff appears to be adequate)  . Seizures ([place])   . Stroke [facility])     Current Medications: Current Meds  Medication Sig  . acetaminophen (TYLENOL) 500 MG tablet Take 500 mg by mouth 4 (four) times daily as needed.  Marland Kitchen albuterol (PROVENTIL HFA;VENTOLIN HFA) 108 (90 Base) MCG/ACT inhaler Inhale 2 puffs into the lungs every 6 (six) hours as needed for wheezing or shortness of breath.  Marland Kitchen aspirin EC 81 MG tablet Take 81 mg by mouth daily.  . budesonide-formoterol (SYMBICORT) 160-4.5 MCG/ACT inhaler  Inhale 2 puffs into the lungs 2 (two) times daily.  . Cholecalciferol (VITAMIN D3) 50 MCG (2000 UT) capsule Take 2,000 Units by mouth daily.   . clopidogrel (PLAVIX) 75 MG tablet Take 1 tablet (75 mg total) by mouth daily.  Marland Kitchen ezetimibe (ZETIA) 10 MG tablet TAKE 1 TAB BY MOUTH EVERY DAY  . fluticasone (FLONASE) 50 MCG/ACT nasal spray Place 2 sprays into both nostrils daily.  . furosemide (LASIX) 20 MG tablet Take 1 tablet (20 mg total) by mouth daily as needed for edema.  . insulin aspart (NOVOLOG FLEXPEN) 100 UNIT/ML FlexPen Sliding scale: BS<150 no insulin, 151-180 give 4U,181-220 give 6U,221-260 give 8U,261-300 give 10U,301-340 give 12 U,and > 341 give 14 u.  . Insulin Glargine (LANTUS SOLOSTAR) 100 UNIT/ML Solostar Pen Inject 20 Units into the skin at bedtime.  . isosorbide mononitrate (IMDUR) 30 MG 24 hr tablet Take 30 mg by mouth daily.   Marland Kitchen lamoTRIgine (LAMICTAL) 200 MG tablet Take 1.5 tablets (300 mg total) by mouth See admin instructions. Take 1 1/2 tablets (300 mg) by mouth twice daily - 8am and 2pm  . losartan (COZAAR) 50 MG tablet Take 1 tablet (50 mg total) by mouth daily.  . metoprolol tartrate (LOPRESSOR) 50 MG tablet TAKE 1 TAB BY MOUTH TWICE DAILY  . Omega-3 Fatty Acids (FISH OIL) 1000 MG CAPS Take 2,000 mg by mouth daily.  . phenytoin (DILANTIN) 100 MG ER capsule Take 300 mg by mouth daily.  . phenytoin (DILANTIN) 30 MG ER capsule Take 1 capsule (30 mg total) by mouth at bedtime.  . rosuvastatin (CRESTOR) 40 MG tablet Take 40 mg by mouth daily.  Marland Kitchen Spacer/Aero Chamber Mouthpiece MISC 1 Device by Does not apply route daily as needed.  Marland Kitchen spironolactone (ALDACTONE) 50 MG tablet Take 1 tablet (50 mg total) by mouth daily.  Marland Kitchen tiZANidine (ZANAFLEX) 4 MG tablet Take 0.5 tablets (2 mg total) by mouth every 12 (twelve) hours as needed for muscle spasms.  . [DISCONTINUED] amLODipine (NORVASC) 10 MG tablet Take 1 tablet (10 mg total) by mouth daily.     Allergies:   Influenza vaccines,  Amoxicillin, Augmentin [amoxicillin-pot clavulanate], Cefadroxil, Cephalexin, Erythromycin, Niacin and related, and Nitrofurantoin macrocrystal   Social History   Tobacco Use  . Smoking status: Former Smoker    Packs/day: 1.00    Types: Cigarettes    Quit date: 2015    Years since quitting: 6.1  . Smokeless tobacco: Never Used  Substance Use Topics  . Alcohol use: No  . Drug use: No     Family Hx: The patient's family history includes Alcohol abuse in her father; Arthritis in her daughter, maternal grandmother, mother, and son; Asthma in her daughter; Birth defects in her brother; COPD in her daughter, father, and son; Cancer in her maternal grandmother;  Depression in her brother; Diabetes in her mother; Early death in her father and mother; Heart attack in her father and son; Hyperlipidemia in her brother, father, mother, and son; Hypertension in her brother, father, mother, sister, and son; Stroke in her mother.  ROS   EKGs/Labs/Other Test Reviewed:    EKG:  EKG is not ordered today.  The ekg ordered today demonstrates n/a  Recent Labs: 04/05/2018: NT-Pro BNP 75 09/19/2018: ALT 24 09/20/2018: BUN 6; Creatinine, Ser 0.60; Hemoglobin 11.9; Platelets 181; Potassium 4.1; Sodium 136   Recent Lipid Panel Lab Results  Component Value Date/Time   CHOL 183 09/09/2017 08:40 AM   TRIG 160 (H) 09/09/2017 08:40 AM   HDL 66 09/09/2017 08:40 AM   CHOLHDL 2.8 09/09/2017 08:40 AM   LDLCALC 85 09/09/2017 08:40 AM    Physical Exam:    VS:  BP (!) 108/50   Pulse 70   Ht 5\' 1"  (1.549 m)   Wt 128 lb (58.1 kg)   LMP 03/17/2015   SpO2 97%   BMI 24.19 kg/m     Wt Readings from Last 3 Encounters:  03/13/19 128 lb (58.1 kg)  03/09/19 130 lb (59 kg)  10/24/18 137 lb (62.1 kg)     Constitutional:      Appearance: Healthy appearance. Not in distress.  Neck:     Thyroid: Thyroid normal.  Pulmonary:     Effort: Pulmonary effort is normal.     Breath sounds: No wheezing. No rales.    Cardiovascular:     Normal rate. Regular rhythm. Normal S1. Normal S2.     Murmurs: There is no murmur.  Edema:    Peripheral edema absent.  Abdominal:     General: There is no distension.     Palpations: Abdomen is soft.  Skin:    General: Skin is cool and dry.  Neurological:     General: No focal deficit present.     Mental Status: Alert and oriented to person, place and time.      ASSESSMENT & PLAN:    1. Coronary artery disease involving native coronary artery of native heart without angina pectoris History of prior cardiac catheterization demonstrating severe diffuse three-vessel CAD.  She has been managed medically.  She is not having any anginal symptoms.  Of note, she is DNR and does not want to pursue any further testing going forward.  Continue amlodipine, aspirin, clopidogrel, ezetimibe, isosorbide, losartan, metoprolol tartrate, rosuvastatin.    2. PAD (peripheral artery disease) ([place]) She has bilateral claudication with minimal activity.  Her symptoms are overall stable.  She has had noted diffuse disease in the past and was told that she was not a candidate for further intervention.  I did offer her referral to our vascular surgeons.  However, she does not want to pursue any further work-up or intervention.  3. Bilateral carotid artery disease, unspecified type ([place]) She is due for carotid ultrasound.  However, she does not want to pursue any further testing or intervention.  She would not want to undergo carotid endarterectomy or stenting even if she is symptomatic with high-grade disease.  4. Essential hypertension Blood pressure running somewhat low.  She does get dizzy at times.  Decrease amlodipine to 5 mg daily.  5. Mixed hyperlipidemia Continue high intensity statin therapy.  Recent ALT normal.  6.  DNR The patient is DNR.  As noted she does not want to pursue further testing or intervention.     Dispo:  Return in  about 1 year (around 03/12/2020) for Routine  Follow Up, w/ Dr. Harrington Lewis, or Richardson Dopp, PA-C, in person.   Medication Adjustments/Labs and Tests Ordered: Current medicines are reviewed at length with the patient today.  Concerns regarding medicines are outlined above.  Tests Ordered: Orders Placed This Encounter  Procedures  . VAS US CAROTID   Medication Changes: Meds ordered this encounter  Medications  . amLODipine (NORVASC) 5 MG tablet    Sig: Take 1 tablet (5 mg total) by mouth daily.    Dispense:  90 tablet    Refill:  3    Signed, Richardson Dopp, PA-C  03/13/2019 3:02 PM    [facility] [address], [place], [place]  [address] Phone: [phone]; Fax: [phone]

## 2019-03-14 ENCOUNTER — Emergency Department (HOSPITAL_COMMUNITY): Payer: HMO

## 2019-03-14 ENCOUNTER — Other Ambulatory Visit: Payer: Self-pay

## 2019-03-14 ENCOUNTER — Encounter (HOSPITAL_COMMUNITY): Payer: Self-pay | Admitting: *Deleted

## 2019-03-14 ENCOUNTER — Emergency Department (HOSPITAL_COMMUNITY)
Admission: EM | Admit: 2019-03-14 | Discharge: 2019-03-15 | Disposition: A | Discharge status: Home / Self Care | Payer: HMO | Attending: Emergency Medicine | Admitting: Emergency Medicine

## 2019-03-14 DIAGNOSIS — Z8673 Personal history of transient ischemic attack (TIA), and cerebral infarction without residual deficits: Secondary | ICD-10-CM | POA: Insufficient documentation

## 2019-03-14 DIAGNOSIS — Z87891 Personal history of nicotine dependence: Secondary | ICD-10-CM | POA: Diagnosis not present

## 2019-03-14 DIAGNOSIS — Z7982 Long term (current) use of aspirin: Secondary | ICD-10-CM | POA: Diagnosis not present

## 2019-03-14 DIAGNOSIS — R0602 Shortness of breath: Secondary | ICD-10-CM

## 2019-03-14 DIAGNOSIS — I252 Old myocardial infarction: Secondary | ICD-10-CM | POA: Insufficient documentation

## 2019-03-14 DIAGNOSIS — Z79899 Other long term (current) drug therapy: Secondary | ICD-10-CM | POA: Insufficient documentation

## 2019-03-14 DIAGNOSIS — I10 Essential (primary) hypertension: Secondary | ICD-10-CM | POA: Insufficient documentation

## 2019-03-14 DIAGNOSIS — I2510 Atherosclerotic heart disease of native coronary artery without angina pectoris: Secondary | ICD-10-CM | POA: Insufficient documentation

## 2019-03-14 DIAGNOSIS — E119 Type 2 diabetes mellitus without complications: Secondary | ICD-10-CM | POA: Insufficient documentation

## 2019-03-14 DIAGNOSIS — D649 Anemia, unspecified: Secondary | ICD-10-CM

## 2019-03-14 DIAGNOSIS — J449 Chronic obstructive pulmonary disease, unspecified: Secondary | ICD-10-CM | POA: Insufficient documentation

## 2019-03-14 DIAGNOSIS — R42 Dizziness and giddiness: Secondary | ICD-10-CM | POA: Diagnosis not present

## 2019-03-14 DIAGNOSIS — Z794 Long term (current) use of insulin: Secondary | ICD-10-CM | POA: Diagnosis not present

## 2019-03-14 LAB — BASIC METABOLIC PANEL
Anion gap: 10 (ref 5–15)
BUN: 17 mg/dL (ref 8–23)
CO2: 24 mmol/L (ref 22–32)
Calcium: 9.3 mg/dL (ref 8.9–10.3)
Chloride: 103 mmol/L (ref 98–111)
Creatinine, Ser: 0.87 mg/dL (ref 0.44–1.00)
GFR calc Af Amer: >60 mL/min (ref >60)
GFR calc non Af Amer: >60 mL/min (ref >60)
Glucose, Bld: 144 mg/dL — ABNORMAL HIGH (ref 70–99)
Potassium: 4.2 mmol/L (ref 3.5–5.1)
Sodium: 137 mmol/L (ref 135–145)

## 2019-03-14 LAB — CBC WITH DIFFERENTIAL/PLATELET
Abs Immature Granulocytes: 0.04 10*3/uL (ref 0.00–0.07)
Basophils Absolute: 0 10*3/uL (ref 0.0–0.1)
Basophils Relative: 1 %
Eosinophils Absolute: 0.1 10*3/uL (ref 0.0–0.5)
Eosinophils Relative: 3 %
HCT: 29.5 % — ABNORMAL LOW (ref 36.0–46.0)
Hemoglobin: 9.5 g/dL — ABNORMAL LOW (ref 12.0–15.0)
Immature Granulocytes: 1 %
Lymphocytes Relative: 24 %
Lymphs Abs: 1.1 10*3/uL (ref 0.7–4.0)
MCH: 29.2 pg (ref 26.0–34.0)
MCHC: 32.2 g/dL (ref 30.0–36.0)
MCV: 90.8 fL (ref 80.0–100.0)
Monocytes Absolute: 0.5 10*3/uL (ref 0.1–1.0)
Monocytes Relative: 10 %
Neutro Abs: 2.8 10*3/uL (ref 1.7–7.7)
Neutrophils Relative %: 61 %
Platelets: 224 10*3/uL (ref 150–400)
RBC: 3.25 MIL/uL — ABNORMAL LOW (ref 3.87–5.11)
RDW: 14.1 % (ref 11.5–15.5)
WBC: 4.6 10*3/uL (ref 4.0–10.5)
nRBC: 0 % (ref 0.0–0.2)

## 2019-03-14 LAB — POC OCCULT BLOOD, ED: Fecal Occult Bld: NEGATIVE

## 2019-03-14 NOTE — Discharge Instructions (Signed)
Your blood test show that your blood counts have dropped approximately 2 units.  There is no other significant abnormal findings and your x-ray looked good.  You may return to the hospital for severe or worsening symptoms or any other concerns otherwise see your doctor in 2 to 3 days.  There was no blood in your stool

## 2019-03-14 NOTE — ED Provider Notes (Signed)
[facility] EMERGENCY DEPARTMENT Provider Note   CSN: [id number] Arrival date & time: 03/14/19  1755     History Chief Complaint  Patient presents with  . Shortness of Breath    Kathryn Lewis is a 74 y.o. female.  HPI   This patient is a 74 year old female, she has a known history of bipolar disorder, she is currently in a nursing facility at [facility], the staff at the facility felt like she was a little bit short of breath.  They note that she does not like to wear a mask and this makes her feel uncomfortable so she was always pulling at it.  They took her outside to see if that made her feel better but it did not so they called the daughter and she was brought to the hospital for evaluation for shortness of breath.  There is also a comment of some possible chest pain but the patient denies this out right.  There is been no coughing, no fever, no swelling though she does have a history of congestive heart failure as well as a prior myocardial infarction, prior catheterization showed various degrees of obstructive disease, no recent interventions.  Echocardiogram greater than 65% ejection fraction in June 2020.  The patient does have DO NOT RESUSCITATE orders and has not wanted to pursue further testing or intervention for her heart.  The patient is not a great historian and does have some memory difficulty.    Past Medical History:  Diagnosis Date  . Allergy   . Arthritis   . Bipolar 1 disorder ([place])   . Blood transfusion without reported diagnosis   . Carotid artery stenosis    50-69% bilateral ICA stenoses by velocity criteria but no visible plaque and ICA/CCA ratio 1.72 on right and 1.38 on left  . Coronary artery disease   . Depression   . Diabetes mellitus without complication ([place])   . Echocardiogram    Echo 06/2018: Hyperdynamic systolic function, EF 99991111, mild concentric LVH, elevated LVEDP (E/E' suggests impaired relaxation), mild AI,  lipomatous interatrial septum  . Hypertension   . Left adrenal mass (HCC)    2.5 cm L adrenal mass on CT per note of Dr. Seleta Rhymes 05/20/2014  . MI, old   . PAD (peripheral artery disease) ([place])    03/2014 - CT aortogram with lower extremity runoff (mild to moderate disease in common iliac arteries, complete occulusion of her bilateral external iliac arteries with heavily disease common femoral arteries. She also has disease to her SFAs bilaterally. Popliteal arteries and tibial vessel runoff appears to be adequate)  . Seizures ([place])   . Stroke [facility])     Patient Active Problem List   Diagnosis Date Noted  . Dementia without behavioral disturbance ([place]) 01/23/2019  . Peripheral polyneuropathy 10/24/2018  . PAD (peripheral artery disease) ([place]) 04/05/2018  . Carotid artery disease ([place]) 04/05/2018  . Hypokalemia 02/27/2018  . Frequent falls 07/08/2017  . Back pain 07/08/2017  . Mixed hyperlipidemia 05/27/2017  . Unstable gait 01/10/2017  . Hyperlipidemia associated with type 2 diabetes mellitus ([place]) 01/10/2017  . OSA on CPAP 12/24/2016  . COPD (chronic obstructive pulmonary disease) ([place]) 12/24/2016  . Pulmonary nodule 12/24/2016  . Generalized weakness 12/12/2015  . Diabetes mellitus type 2 with neurological manifestations ([place]) 12/12/2015  . Hypertension with heart disease 12/12/2015  . Seizure disorder as sequela of cerebrovascular accident ([place]) 04/17/2015  . Atherosclerotic heart disease of native coronary artery with other forms of angina  pectoris ([place]) 04/17/2015  . Anemia, unspecified 03/02/2010  . Cough 03/02/2010  . Depressive disorder, not elsewhere classified 03/02/2010  . Pneumonia, organism unspecified(486) 03/02/2010  . Shortness of breath 03/02/2010  . Type II or unspecified type diabetes mellitus without mention of complication, not stated as uncontrolled 03/02/2010  . Wheezing 03/02/2010  . Chronic airway obstruction, not elsewhere classified 03/02/2010  . Essential  hypertension 03/02/2010  . Acute, but ill-defined, cerebrovascular disease 03/02/2010    Past Surgical History:  Procedure Laterality Date  . BREAST SURGERY  2012   small lump nonmilignant  . NECK SURGERY       OB History   No obstetric history on file.     Family History  Problem Relation Age of Onset  . Hypertension Mother   . Arthritis Mother   . Diabetes Mother   . Early death Mother   . Hyperlipidemia Mother   . Stroke Mother   . Hypertension Father   . Alcohol abuse Father   . Early death Father   . Hyperlipidemia Father   . COPD Father   . Heart attack Father   . Hypertension Sister   . Birth defects Brother   . Depression Brother   . Hyperlipidemia Brother   . Hypertension Brother   . Arthritis Daughter   . Asthma Daughter   . COPD Daughter   . Arthritis Son   . COPD Son   . Hypertension Son   . Hyperlipidemia Son   . Heart attack Son   . Arthritis Maternal Grandmother   . Cancer Maternal Grandmother     Social History   Tobacco Use  . Smoking status: Former Smoker    Packs/day: 1.00    Types: Cigarettes    Quit date: 2015    Years since quitting: 6.1  . Smokeless tobacco: Never Used  Substance Use Topics  . Alcohol use: No  . Drug use: No    Home Medications Prior to Admission medications   Medication Sig Start Date End Date Taking? Authorizing Provider  acetaminophen (TYLENOL) 500 MG tablet Take 500 mg by mouth 4 (four) times daily as needed.    [provider]  albuterol (PROVENTIL HFA;VENTOLIN HFA) 108 (90 Base) MCG/ACT inhaler Inhale 2 puffs into the lungs every 6 (six) hours as needed for wheezing or shortness of breath. 03/21/18   [place], Betty G, MD  amLODipine (NORVASC) 5 MG tablet Take 1 tablet (5 mg total) by mouth daily. 03/13/19   Richardson Dopp T, PA-C  aspirin EC 81 MG tablet Take 81 mg by mouth daily.    [provider]  budesonide-formoterol (SYMBICORT) 160-4.5 MCG/ACT inhaler Inhale 2 puffs into the lungs 2  (two) times daily. 03/22/18   [place], Betty G, MD  Cholecalciferol (VITAMIN D3) 50 MCG (2000 UT) capsule Take 2,000 Units by mouth daily.     [provider]  clopidogrel (PLAVIX) 75 MG tablet Take 1 tablet (75 mg total) by mouth daily. 03/02/19   [place], Betty G, MD  ezetimibe (ZETIA) 10 MG tablet TAKE 1 TAB BY MOUTH EVERY DAY 02/16/19   [place], Betty G, MD  fluticasone [facility]) 50 MCG/ACT nasal spray Place 2 sprays into both nostrils daily. 04/22/17   Nafziger, Tommi Rumps, NP  furosemide (LASIX) 20 MG tablet Take 1 tablet (20 mg total) by mouth daily as needed for edema. 03/21/18   [place], Betty G, MD  insulin aspart (NOVOLOG FLEXPEN) 100 UNIT/ML FlexPen Sliding scale: BS<150 no insulin, 151-180 give [id number] give [phone]  give 8U,261-300 give 10U,301-340 give 12 U,and > 341 give 14 u. 09/20/18   [place], Betty G, MD  Insulin Glargine (LANTUS SOLOSTAR) 100 UNIT/ML Solostar Pen Inject 20 Units into the skin at bedtime. 01/09/19   [place], Betty G, MD  isosorbide mononitrate (IMDUR) 30 MG 24 hr tablet Take 30 mg by mouth daily.     [provider]  lamoTRIgine (LAMICTAL) 200 MG tablet Take 1.5 tablets (300 mg total) by mouth See admin instructions. Take 1 1/2 tablets (300 mg) by mouth twice daily - 8am and 2pm 03/13/18   Cameron Sprang, MD  losartan (COZAAR) 50 MG tablet Take 1 tablet (50 mg total) by mouth daily. 10/24/18   [place], Betty G, MD  metoprolol tartrate (LOPRESSOR) 50 MG tablet TAKE 1 TAB BY MOUTH TWICE DAILY 01/10/19   [place], Betty G, MD  Omega-3 Fatty Acids (FISH OIL) 1000 MG CAPS Take 2,000 mg by mouth daily.    [provider]  phenytoin (DILANTIN) 100 MG ER capsule Take 300 mg by mouth daily.    [provider]  phenytoin (DILANTIN) 30 MG ER capsule Take 1 capsule (30 mg total) by mouth at bedtime. 03/13/18   Cameron Sprang, MD  rosuvastatin (CRESTOR) 40 MG tablet Take 40 mg by mouth daily.    [provider]  Spacer/Aero Chamber Mouthpiece MISC 1  Device by Does not apply route daily as needed. 12/24/16   Rigoberto Noel, MD  spironolactone (ALDACTONE) 50 MG tablet Take 1 tablet (50 mg total) by mouth daily. 05/27/17   Fay Records, MD  tiotropium (SPIRIVA HANDIHALER) 18 MCG inhalation capsule Place 1 capsule (18 mcg total) into inhaler and inhale daily. 06/21/18 09/20/18  [place], Betty G, MD  tiZANidine (ZANAFLEX) 4 MG tablet Take 0.5 tablets (2 mg total) by mouth every 12 (twelve) hours as needed for muscle spasms. 01/30/19   [place], Betty G, MD    Allergies    Influenza vaccines, Amoxicillin, Augmentin [amoxicillin-pot clavulanate], Cefadroxil, Cephalexin, Erythromycin, Niacin and related, and Nitrofurantoin macrocrystal  Review of Systems   Review of Systems  All other systems reviewed and are negative.   Physical Exam Updated Vital Signs BP (!) 164/69   Pulse 75   Temp 98.2 F (36.8 C) (Oral)   Resp 15   Ht 1.549 m (5\' 1" )   Wt 58 kg   LMP 03/17/2015   SpO2 100%   BMI 24.16 kg/m   Physical Exam Vitals and nursing note reviewed.  Constitutional:      General: She is not in acute distress.    Appearance: She is well-developed.  HENT:     Head: Normocephalic and atraumatic.     Mouth/Throat:     Pharynx: No oropharyngeal exudate.  Eyes:     General: No scleral icterus.       Right eye: No discharge.        Left eye: No discharge.     Conjunctiva/sclera: Conjunctivae normal.     Pupils: Pupils are equal, round, and reactive to light.  Neck:     Thyroid: No thyromegaly.     Vascular: No JVD.  Cardiovascular:     Rate and Rhythm: Normal rate and regular rhythm.     Heart sounds: Normal heart sounds. No murmur. No friction rub. No gallop.   Pulmonary:     Effort: Pulmonary effort is normal. No respiratory distress.     Breath sounds: Normal breath sounds. No wheezing or rales.  Abdominal:  General: Bowel sounds are normal. There is no distension.     Palpations: Abdomen is soft. There is no mass.      Tenderness: There is no abdominal tenderness.  Musculoskeletal:        General: No tenderness. Normal range of motion.     Cervical back: Normal range of motion and neck supple.  Lymphadenopathy:     Cervical: No cervical adenopathy.  Skin:    General: Skin is warm and dry.     Findings: No erythema or rash.  Neurological:     Mental Status: She is alert.     Coordination: Coordination normal.  Psychiatric:        Behavior: Behavior normal.     ED Results / Procedures / Treatments   Labs (all labs ordered are listed, but only abnormal results are displayed) Labs Reviewed - No data to display  EKG EKG Interpretation  Date/Time:  Wednesday March 14 2019 17:56:21 EST Ventricular Rate:  78 PR Interval:    QRS Duration: 97 QT Interval:  369 QTC Calculation: 421 R Axis:   69 Text Interpretation: Sinus rhythm Borderline repolarization abnormality since last tracing no significant change Confirmed by Noemi Chapel [phone]) on 03/14/2019 6:50:29 PM   Radiology DG Chest Port 1 View  Result Date: 03/14/2019 CLINICAL DATA:  Shortness of breath EXAM: PORTABLE CHEST 1 VIEW COMPARISON:  09/20/2018 FINDINGS: Cardiomediastinal contours and hilar structures are unremarkable. Lungs are clear. Signs of cervical spinal fusion are incidentally noted at the upper margin of the radiograph. No acute bone process. IMPRESSION: No acute cardiopulmonary disease. Electronically Signed   By: Zetta Bills M.D.   On: 03/14/2019 19:27    Procedures Procedures (including critical care time)  Medications Ordered in ED Medications - No data to display  ED Course  I have reviewed the triage vital signs and the nursing notes.  Pertinent labs & imaging results that were available during my care of the patient were reviewed by me and considered in my medical decision making (see chart for details).    MDM Rules/Calculators/A&P                      The patient's lung sounds and heart sounds are  unremarkable, vital signs are reassuring.  When I take the patient's mask off of her face that she states that her shortness of breath goes away.  The EKG is unremarkable, we will obtain a chest x-ray to rule out any other causes of shortness of breath though the patient appears well and seems reasonable for discharge.  Hemoglobin has dropped from 11.9-9.5 over the last 6 months, Hemoccult done in the room with nurse present, no gross blood, no melena, normal-appearing soft brown to gray appearing stool  Patient still well-appearing with normal vital signs  Hemoccult neg - stable for d/c    Final Clinical Impression(s) / ED Diagnoses Final diagnoses:  Shortness of breath  Anemia, unspecified type    Rx / DC Orders ED Discharge Orders    None       Noemi Chapel, MD 03/16/19 1319

## 2019-03-14 NOTE — ED Triage Notes (Signed)
Pt reports when walking across her room she was short of breath. Pt reports this is not new Pt 100 % on room air in ED room . EMS had placed Pt on 4 liters nasal O2 during transport and reported Pt does not use O2  At home. Pt Daughter  Aisia Mccarty  [phone]

## 2019-03-14 NOTE — ED Notes (Signed)
Attempted to contact [facility], unsuccessful. Facility contacted by pt's daughter on pt's discharge. Pt awaiting [facility] for discharge transportation. Pt and pt's daughter verbalizes understanding of d/c instructions.

## 2019-03-14 NOTE — ED Notes (Signed)
Portable Xray being completed at this time.

## 2019-03-15 DIAGNOSIS — I10 Essential (primary) hypertension: Secondary | ICD-10-CM | POA: Diagnosis not present

## 2019-03-15 DIAGNOSIS — Z743 Need for continuous supervision: Secondary | ICD-10-CM | POA: Diagnosis not present

## 2019-03-15 DIAGNOSIS — R0602 Shortness of breath: Secondary | ICD-10-CM | POA: Diagnosis not present

## 2019-03-15 DIAGNOSIS — R279 Unspecified lack of coordination: Secondary | ICD-10-CM | POA: Diagnosis not present

## 2019-03-15 NOTE — ED Notes (Signed)
ptar here to transport 

## 2019-03-15 NOTE — ED Notes (Signed)
Pt still waiting to be transported home  [place] requested by daughter for the pt

## 2019-03-16 ENCOUNTER — Telehealth: Payer: Self-pay | Admitting: Family Medicine

## 2019-03-16 NOTE — Telephone Encounter (Signed)
Okay to fill? 

## 2019-03-16 NOTE — Telephone Encounter (Signed)
Medication Refill: Iron  Pharmacy: [facility]  Phone:   Pt daughter said that nursing home informed that pt iron was low (9).

## 2019-03-21 ENCOUNTER — Telehealth (INDEPENDENT_AMBULATORY_CARE_PROVIDER_SITE_OTHER): Payer: HMO | Admitting: Neurology

## 2019-03-21 ENCOUNTER — Other Ambulatory Visit: Payer: Self-pay

## 2019-03-21 ENCOUNTER — Encounter: Payer: Self-pay | Admitting: Neurology

## 2019-03-21 DIAGNOSIS — G40209 Localization-related (focal) (partial) symptomatic epilepsy and epileptic syndromes with complex partial seizures, not intractable, without status epilepticus: Secondary | ICD-10-CM

## 2019-03-21 MED ORDER — PHENYTOIN SODIUM EXTENDED 100 MG PO CAPS: 300.0000 mg | ORAL_CAPSULE | Freq: Every day | ORAL | 3 refills | Status: DC

## 2019-03-21 MED ORDER — PHENYTOIN SODIUM EXTENDED 30 MG PO CAPS: 30.0000 mg | ORAL_CAPSULE | Freq: Every day | ORAL | 3 refills | Status: DC

## 2019-03-21 MED ORDER — FERROUS SULFATE 325 (65 FE) MG PO TABS: ORAL_TABLET | ORAL | 3 refills | Status: DC

## 2019-03-21 MED ORDER — LAMOTRIGINE 200 MG PO TABS: 300.0000 mg | ORAL_TABLET | ORAL | 3 refills | Status: DC

## 2019-03-21 NOTE — Progress Notes (Signed)
Virtual Visit via Video Note The purpose of this virtual visit is to provide medical care while limiting exposure to the novel coronavirus.    Consent was obtained for video visit:  Yes.   Answered questions that patient had about telehealth interaction:  Yes.   I discussed the limitations, risks, security and privacy concerns of performing an evaluation and management service by telemedicine. I also discussed with the patient that there may be a patient responsible charge related to this service. The patient expressed understanding and agreed to proceed.  Pt location: Home Physician Location: office Name of referring provider:  [place], Betty G, MD I connected with Kathryn Lewis at patients initiation/request on 03/21/2019 at  2:00 PM EST by video enabled telemedicine application and verified that I am speaking with the correct person using two identifiers. Pt MRN:  [id number] Pt DOB:  03-29-1945 Video Participants:  Kathryn Lewis;  Lenoria Chime (daughter)   History of Present Illness:  The patient was seen as a virtual video visit on 03/21/2019. She was last seen in the neurology clinic a year ago for seizures. Her daughter [name] is present during the e-visit to provide additional information. Since her last visit, she has moved to [facility] and is doing a little better than before. They both deny any seizures since 2016. Staff has not mentioned any episodes of staring/unresponsiveness. She is on Lamotrigine 200mg  1.5 tabs BID and Phenytoin 330mg  daily without side effects. Sometimes she feels sleepy during the day which she attributes to medication, however she mostly stays in her room and does not participate in activities. She was in the ER last week due to shortness of breath and was diagnosed with iron-deficiency anemia. She feels her breathing is better. She uses a wheelchair for mobility, no falls. She has chronic low back pain. No headaches, dizziness.    History on Initial  Assessment 03/18/2017: This is a pleasant 74 year old ambidextrous right-hand dominant woman with a history of hypertension, diabetes, peripheral vascular disease, COPD, sleep apnea on CPAP, CAD, bipolar disorder, stroke in 2015 with subsequent seizures, presenting to establish care. She had previously been seeing neurologist Dr. Chipper Herb in [place], [place], records unavailable for review. She and her daughter report that she had a stroke in 2015 which mostly affected her vision and memory. She cannot read. She can write her name and sometimes a word, but something would write a different word. The first seizure occurred in 2016, she recalls that everything got loud and mixed up, she tried calling her son but per daughter was "hollering out loud" then became unconscious. They report possibly only 2 seizures at that time, no seizures since 2016. She is taking Lamotrigine 200mg  1.5 tabs BID and Dilantin 330mg  qhs with no side effects. She had previously been living with her daughter, but due to her daughter medical issues, the patient opted to move to [facility] last year. She has had memory issues since the stroke, her daughter manages bills. Medications are administered at [facility]. She does not drive. She reports bilateral leg pain and deep pain radiating to her lower back. She also has a diagnosis of sciatica, "I think the left one." She has peripheral artery disease and sees Vascular.   She denies any olfactory/gustatory hallucinations, deja vu, rising epigastric sensation, focal numbness/tingling/weakness, myoclonic jerks. She denies any headaches, dizziness, diplopia, neck/back pain, bowel/bladder dysfunction.   Epilepsy Risk Factors:  History of large right posterior temporal and medial occipital infarct  with encephalomalacia and gliosis. Otherwise she had a normal birth and early development.  There is no history of febrile convulsions, CNS infections such as meningitis/encephalitis, significant  traumatic brain injury, neurosurgical procedures, or family history of seizures.  Outpatient Encounter Medications as of 03/21/2019  Medication Sig  . acetaminophen (TYLENOL) 500 MG tablet Take 500 mg by mouth 4 (four) times daily as needed.  Marland Kitchen albuterol (PROVENTIL HFA;VENTOLIN HFA) 108 (90 Base) MCG/ACT inhaler Inhale 2 puffs into the lungs every 6 (six) hours as needed for wheezing or shortness of breath.  Marland Kitchen amLODipine (NORVASC) 5 MG tablet Take 1 tablet (5 mg total) by mouth daily.  Marland Kitchen aspirin EC 81 MG tablet Take 81 mg by mouth daily.  . budesonide-formoterol (SYMBICORT) 160-4.5 MCG/ACT inhaler Inhale 2 puffs into the lungs 2 (two) times daily.  . Cholecalciferol (VITAMIN D3 PO) Take 8,000 Units by mouth daily.   . clopidogrel (PLAVIX) 75 MG tablet Take 1 tablet (75 mg total) by mouth daily.  Marland Kitchen Dextran 70-Hypromellose (NATURAL BALANCE TEARS OP) Apply to eye. 1.4%, 2 drops each eye  . ezetimibe (ZETIA) 10 MG tablet TAKE 1 TAB BY MOUTH EVERY DAY  . fluticasone (FLONASE) 50 MCG/ACT nasal spray Place 2 sprays into both nostrils daily.  . furosemide (LASIX) 20 MG tablet Take 1 tablet (20 mg total) by mouth daily as needed for edema.  Marland Kitchen guaiFENesin (MUCINEX) 600 MG 12 hr tablet Take by mouth 2 (two) times daily.  . insulin aspart (NOVOLOG FLEXPEN) 100 UNIT/ML FlexPen Sliding scale: BS<150 no insulin, 151-180 give 4U,181-220 give 6U,221-260 give 8U,261-300 give 10U,301-340 give 12 U,and > 341 give 14 u.  . Insulin Glargine (LANTUS SOLOSTAR) 100 UNIT/ML Solostar Pen Inject 20 Units into the skin at bedtime.  . isosorbide mononitrate (IMDUR) 30 MG 24 hr tablet Take 30 mg by mouth daily.   Marland Kitchen lamoTRIgine (LAMICTAL) 200 MG tablet Take 1.5 tablets (300 mg total) by mouth See admin instructions. Take 1 1/2 tablets (300 mg) by mouth twice daily - 8am and 2pm  . losartan (COZAAR) 50 MG tablet Take 1 tablet (50 mg total) by mouth daily.  . metoprolol tartrate (LOPRESSOR) 50 MG tablet TAKE 1 TAB BY MOUTH TWICE  DAILY  . Omega-3 Fatty Acids (FISH OIL) 1000 MG CAPS Take 2,000 mg by mouth daily.  . phenytoin (DILANTIN) 100 MG ER capsule Take 3 capsules (300 mg total) by mouth daily.  . phenytoin (DILANTIN) 30 MG ER capsule Take 1 capsule (30 mg total) by mouth at bedtime.  . rosuvastatin (CRESTOR) 40 MG tablet Take 40 mg by mouth daily.  Marland Kitchen Spacer/Aero Chamber Mouthpiece MISC 1 Device by Does not apply route daily as needed.  Marland Kitchen spironolactone (ALDACTONE) 50 MG tablet Take 1 tablet (50 mg total) by mouth daily.  Marland Kitchen tiotropium (SPIRIVA HANDIHALER) 18 MCG inhalation capsule Place 1 capsule (18 mcg total) into inhaler and inhale daily.  Marland Kitchen tiZANidine (ZANAFLEX) 4 MG tablet Take 0.5 tablets (2 mg total) by mouth every 12 (twelve) hours as needed for muscle spasms.  . White Petrolatum-Mineral Oil (ARTIFICIAL TEARS) ointment Place into both eyes as needed.  .    .    .     No facility-administered encounter medications on file as of 03/21/2019.    Observations/Objective:   GEN:  The patient appears stated age and is in NAD.  Neurological examination: Patient is awake, alert, oriented x 3. No aphasia or dysarthria. Intact fluency and comprehension. Remote and recent memory impaired: Extraocular movements  intact with no nystagmus. No facial asymmetry. Motor: moves all extremities symmetrically, at least anti-gravity x 4. No incoordination on finger to nose testing. Gait: not tested.  Assessment and Plan:   This is a pleasant 74 yo RH woman with a history of hypertension, diabetes, peripheral vascular disease, COPD, sleep apnea on CPAP, CAD, bipolar disorder, stroke in 2015 with subsequent seizures. She has been seizure-free since 2016 on Lamogrigine 200mg  1.5 tabs BID and Phenytoin 330mg  qhs, refills sent. She is now living at [facility] with more assistance, she was encouraged to participate in group activities. Continue close supervision. She will follow-up in 1 year, they know to call for any changes.    Follow Up Instructions:   -I discussed the assessment and treatment plan with the patient. The patient was provided an opportunity to ask questions and all were answered. The patient agreed with the plan and demonstrated an understanding of the instructions.   The patient was advised to call back or seek an in-person evaluation if the symptoms worsen or if the condition fails to improve as anticipated.    Cameron Sprang, MD

## 2019-03-21 NOTE — Telephone Encounter (Signed)
If iron is low,it is ok to resume Fe sulfate 325 mg every 1-2 days (depending of tolerance) with vit C. Thanks, BJ

## 2019-03-21 NOTE — Telephone Encounter (Signed)
Rx sent 

## 2019-03-22 ENCOUNTER — Telehealth: Payer: Self-pay | Admitting: Family Medicine

## 2019-03-22 DIAGNOSIS — E782 Mixed hyperlipidemia: Secondary | ICD-10-CM | POA: Diagnosis not present

## 2019-03-22 DIAGNOSIS — F319 Bipolar disorder, unspecified: Secondary | ICD-10-CM | POA: Diagnosis not present

## 2019-03-22 DIAGNOSIS — E1151 Type 2 diabetes mellitus with diabetic peripheral angiopathy without gangrene: Secondary | ICD-10-CM | POA: Diagnosis not present

## 2019-03-22 DIAGNOSIS — Z794 Long term (current) use of insulin: Secondary | ICD-10-CM | POA: Diagnosis not present

## 2019-03-22 DIAGNOSIS — I2510 Atherosclerotic heart disease of native coronary artery without angina pectoris: Secondary | ICD-10-CM | POA: Diagnosis not present

## 2019-03-22 DIAGNOSIS — Z9181 History of falling: Secondary | ICD-10-CM | POA: Diagnosis not present

## 2019-03-22 DIAGNOSIS — Z8673 Personal history of transient ischemic attack (TIA), and cerebral infarction without residual deficits: Secondary | ICD-10-CM | POA: Diagnosis not present

## 2019-03-22 DIAGNOSIS — E279 Disorder of adrenal gland, unspecified: Secondary | ICD-10-CM | POA: Diagnosis not present

## 2019-03-22 DIAGNOSIS — J449 Chronic obstructive pulmonary disease, unspecified: Secondary | ICD-10-CM | POA: Diagnosis not present

## 2019-03-22 DIAGNOSIS — M1990 Unspecified osteoarthritis, unspecified site: Secondary | ICD-10-CM | POA: Diagnosis not present

## 2019-03-22 DIAGNOSIS — I10 Essential (primary) hypertension: Secondary | ICD-10-CM | POA: Diagnosis not present

## 2019-03-22 DIAGNOSIS — Z87891 Personal history of nicotine dependence: Secondary | ICD-10-CM | POA: Diagnosis not present

## 2019-03-22 DIAGNOSIS — R569 Unspecified convulsions: Secondary | ICD-10-CM | POA: Diagnosis not present

## 2019-03-22 DIAGNOSIS — I252 Old myocardial infarction: Secondary | ICD-10-CM | POA: Diagnosis not present

## 2019-03-22 NOTE — Telephone Encounter (Signed)
Kathryn Lewis from [facility] would like verbal orders for the patient to be seen for one week, two times a week, and two times for four times a week. call back number is  for [facility] [phone]

## 2019-03-23 ENCOUNTER — Telehealth: Payer: Self-pay | Admitting: Family Medicine

## 2019-03-23 NOTE — Telephone Encounter (Signed)
Melissa Elord from [facility] would like verbal orders to see pt for occupational therapy 2 times a week for 3 weeks and 1 time a week for 3 weeks.   Melissa Phone: [phone]

## 2019-03-23 NOTE — Telephone Encounter (Signed)
It is okay to give verbal authorization to requested services. Thanks, BJ 

## 2019-03-23 NOTE — Telephone Encounter (Signed)
Okay for verbal 

## 2019-03-23 NOTE — Telephone Encounter (Signed)
It is okay to give verbal authorization to requested orders. Thanks, BJ

## 2019-03-23 NOTE — Telephone Encounter (Signed)
Verbal given 

## 2019-03-26 NOTE — Telephone Encounter (Signed)
I called and spoke with [facility]. They will send a message to clinical nurse manager & they will return my call.

## 2019-03-27 ENCOUNTER — Telehealth: Payer: Self-pay | Admitting: Family Medicine

## 2019-03-27 NOTE — Telephone Encounter (Signed)
fyi

## 2019-03-27 NOTE — Telephone Encounter (Signed)
Remo Lipps with [facility] calling to give an update. He has not been able to get a hold of pt regarding the evaluation. Thanks

## 2019-03-30 NOTE — Telephone Encounter (Signed)
Kathryn Lewis called back and I informed him that [place] did ok the orders on 2/26 and Judson Roch tried calling on 3/1 but due to a number left off his number she was unable to call him directly. Kathryn Lewis stated he will document that we tried giving him the OK on 3/1. Nothing further  Kathryn Lewis can be reached at [phone] if needed

## 2019-04-30 ENCOUNTER — Ambulatory Visit: Payer: Self-pay

## 2019-05-08 ENCOUNTER — Ambulatory Visit: Payer: Self-pay

## 2019-05-09 ENCOUNTER — Other Ambulatory Visit: Payer: Self-pay

## 2019-05-09 NOTE — Patient Outreach (Signed)
[facility]) Care Management Chronic Special Needs Program  05/09/2019  Name: Novia Kania DOB: 11/14/1945  MRN: [id number]  Ms. Neela Zierden is enrolled in a chronic special needs plan for Diabetes.Telephone call to daughter/ legal guardian, Khadijat Muska.  HIPAA verified by daughter for client. Daughter states client has not had any new issues. She reports client continues to reside at [facility] assisted living facility. She reports Palliative care continues to see client as needed. Daughter states she is not sure what client's blood sugars have been running lately. She reports client's most recent A1c was 5.4.  She denies any knowledge of client's insulin being adjusted. Daughter reports client is on a sliding scale for her insulin. She denies client reporting any recent falls since last outreach from [facility]. She reports client was seen in the emergency room for shortness of breath/ anemia. Daughter reports client is doing fine at this time. She states client continues to takes her medications as prescribed and takes an iron pill. Daughter states clients is stable overall. She reports client has good support from her and the assisted living facility with palliative care follow up. She denies client having any needs. Daughter verbally agreed to client moving from 3 month follow up calls with RNCM to 6 month. Confirmed daughter has contact phone number for [facility] if needed. Reviewed and updated care plan.  Subjective:  Goals Addressed            This Visit's Progress   . COMPLETED: Advanced Care Planning complete by March 2021       Advanced directive completed. Daughter is client's legal guardian.    . COMPLETED: Client will have ADL/IADL needs addressed by March 2021       Client resides in assisted living facility and staff assists with ADLs and daughter and staff assist with IADLS    . Client's daughter understands the importance of follow-up with providers by  attending scheduled visits   On track    Daughter accompanies client to her appointments Most recent primary care provider visit 02/19/19 Most recent cardiology visit 03/13/19 Continue to maintain and keep follow up appointments with your providers.  Goal renewed 2021    . Client's daughter will report that client sustains no fall or injuries in the next 3 months.   On track    Daughter reports no falls since last outreach with RN case Freight forwarder. RN case manager will send daughter education article: Preventing falls in older adults Goal renewed 2021     . Hgb A1C <7 or as defined by provider.   On track    Hgb A1C= 5.4% on 01/23/19 Discussed diabetes self management actions:  Glucose monitoring per provider recommendation  Visit provider every 3-6 months as directed  Hbg A1C level every 3-6 months.  Carbohydrate controlled meal planning  Taking diabetes medication as prescribed by provider  Physical activity  Goal renewed 2021    . Obtain annual  Lipid Profile, LDL-C   On track    Chart review reveal most recent lipid profile completed on 09/09/17 The goal for LDL is less than 70 mg/ dl as you are at high risk for complications try to avoid saturated fats, trans-fats, and eat more fiber. Goal renewed 2021     . Obtain Annual Eye (retinal)  Exam    On track    Diabetic eye exam was completed on 11/08/18 Plan to schedule your eye exam yearly Goal renewed 2021    . Obtain  Annual Foot Exam   On track    Diabetic foot exam was completed on 10/24/18 Diabetes foot care - Check feet daily at home (look for skin color changes, cuts, sores or cracks in the skin, swelling of feet or ankles, ingrown or fungal toenails, corn or calluses). Report these findings to your doctor - Wash feet with soap and water, dry feet well especially between toes - Moisturize your feet but not between the toes - Always wear shoes that protect your whole feet.  Goal renewed 2021    . Obtain Hemoglobin A1C  at least 2 times per year   On track    Hgb A1C was completed on 01/23/19 and 10/24/18 Continue to keep your follow up appointments with your provider and have lab work completed as recommended.  Goal renewed 2021    . Visit Primary Care Provider or Endocrinologist at least 2 times per year   On track    Client completed visits with primary care provider on 10/24/18 and 01/23/19 Continue to see your primary care provider for yearly physical and follow up visits Goal renewed. 2021      ASSESSMENT: Client appears to be stable with good support per discussion/ assessment with daughter. Will change client to tier level 2 for Q 6 month follow up calls with RNCM.   Plan:  Send successful outreach letter with a copy of their individualized care plan, Send individual care plan to provider and Send educational material  Chronic care management coordinator will outreach in:  6 Months  Quinn Plowman RN,BSN,CCM [facility] [phone]    .

## 2019-05-10 ENCOUNTER — Telehealth: Payer: Self-pay

## 2019-05-10 NOTE — Telephone Encounter (Signed)
Message sent to Care Coordinator at [facility] to follow up on daughter's request for replacement mattress.

## 2019-05-11 ENCOUNTER — Telehealth: Payer: Self-pay | Admitting: Family Medicine

## 2019-05-11 NOTE — Telephone Encounter (Signed)
Melissa with [facility], would like verbal orders for pt to continue occupational health. Thanks  Melissa # [phone]

## 2019-05-11 NOTE — Telephone Encounter (Signed)
Left message for Kathryn Lewis to return call to office for verbal orders.

## 2019-05-14 NOTE — Telephone Encounter (Signed)
Verbal orders given to [facility] as requested.

## 2019-05-14 NOTE — Telephone Encounter (Signed)
Left message for Melissa to return call to office.

## 2019-05-16 ENCOUNTER — Telehealth: Payer: Self-pay | Admitting: Family Medicine

## 2019-05-16 NOTE — Telephone Encounter (Signed)
Spoke with Richardson Landry and gave verbal orders as requested.

## 2019-05-16 NOTE — Telephone Encounter (Signed)
Francee Nodal physical therapist  from [facility] would like verbal orders to have 1 more physical therapy visit to complete discharge.   Phone: [phone]

## 2019-05-18 ENCOUNTER — Telehealth: Payer: Self-pay | Admitting: Family Medicine

## 2019-05-18 NOTE — Telephone Encounter (Signed)
Okay to give verbal orders? Please advise. 

## 2019-05-18 NOTE — Telephone Encounter (Signed)
It is ok to given verbal authorization to requested services. ?Thanks, ?BJ ?

## 2019-05-18 NOTE — Telephone Encounter (Signed)
Kathryn Lewis needs verbal orders to continue occupational therapy and for a Speech evaluation.  Kathryn Lewis's phone number [phone]

## 2019-05-18 NOTE — Telephone Encounter (Signed)
Spoke with Melissa and gave verbal orders as requested.

## 2019-05-22 ENCOUNTER — Non-Acute Institutional Stay: Payer: HMO | Admitting: Hospice

## 2019-05-22 ENCOUNTER — Other Ambulatory Visit: Payer: Self-pay

## 2019-05-22 DIAGNOSIS — J449 Chronic obstructive pulmonary disease, unspecified: Secondary | ICD-10-CM

## 2019-05-22 DIAGNOSIS — Z515 Encounter for palliative care: Secondary | ICD-10-CM | POA: Diagnosis not present

## 2019-05-22 NOTE — Progress Notes (Signed)
[facility] Consult Note Telephone: [phone]  Fax: [phone]  PATIENT NAME: Kathryn Lewis DOB: Jun 08, 1945 MRN: [id number]  PRIMARY CARE PROVIDER:   [place], Betty G, MD  REFERRING PROVIDER:  [place], Betty G, MD [place],  [place] [address]   RESPONSIBLE PARTY:[name] Shugarts (daughter) [phone] - Legal Guardian  RECOMMENDATIONS/PLAN: Patient is a DNR; Goals of care include to maximize quality of life and symptom management. Member shared her joy in her room which was rearranged to be more spacious, saying her daughter was part of the process. Therapeutic listening and validation provided. Called [name] and left voicemail with call back number 2. Symptom management:Patient is pleasant, chatty, denied pain/discomfort, no coughing or shortness of breath. Chart review shows patient was seen at ED 03/14/2019 for shortness of breath but was discharged back to facility same day as lung sounds and heart sounds and diagnostics were unremarkable, VS reassuring. No COPD flare. Ongoing memory loss and forgetfulness noted; at baseline. Patient continues on Lantus and Novolog with no adverse reports; Last reported A1c 5.2 01/23/2019 per chart review. Ongoing education on avoiding/limiting  concentratedsweets and empty caloric drinks.  If needed, initiate hypoglycemic/hyperglycemic protocol per facility. Patient is ambulatory with her rolling walker. No report of falls or hospitalization since last visit. Patient denies pain/discomfort, in no acute distress. Patient is compliant with her medications. Nursing with no concerns. Encouraged ongoing nursing care.  3. Follow up Palliative Care Visit: Palliative care will continue to follow for goals of care clarification and symptom management. I spent one hour and 75minutes providing this consultation, time includes chart review and documentation.More than 50% of the time in this  consultation was spent coordinating communication.  HISTORY OF PRESENT ILLNESS:Kathryn Lewis a 89year oldfemalewith multiple medical problems including HTN,Type 2 DM,HLD, CAD, s/pCVA, gait disorder, STML, COPD, OSA. Palliative Care was asked to help address goals of care.care   CODE STATUS: DNR  PPS: 50% HOSPICE ELIGIBILITY/DIAGNOSIS: TBD  PAST MEDICAL HISTORY:  Past Medical History:  Diagnosis Date  . Allergy   . Arthritis   . Bipolar 1 disorder ([place])   . Blood transfusion without reported diagnosis   . Carotid artery stenosis    50-69% bilateral ICA stenoses by velocity criteria but no visible plaque and ICA/CCA ratio 1.72 on right and 1.38 on left  . Coronary artery disease   . Depression   . Diabetes mellitus without complication ([place])   . Echocardiogram    Echo 06/2018: Hyperdynamic systolic function, EF 99991111, mild concentric LVH, elevated LVEDP (E/E' suggests impaired relaxation), mild AI, lipomatous interatrial septum  . Hypertension   . Left adrenal mass (HCC)    2.5 cm L adrenal mass on CT per note of Dr. Seleta Rhymes 05/20/2014  . MI, old   . PAD (peripheral artery disease) ([place])    03/2014 - CT aortogram with lower extremity runoff (mild to moderate disease in common iliac arteries, complete occulusion of her bilateral external iliac arteries with heavily disease common femoral arteries. She also has disease to her SFAs bilaterally. Popliteal arteries and tibial vessel runoff appears to be adequate)  . Seizures ([place])   . Stroke [facility])     SOCIAL HX:  Social History   Tobacco Use  . Smoking status: Former Smoker    Packs/day: 1.00    Types: Cigarettes    Quit date: 2015    Years since quitting: 6.3  . Smokeless tobacco: Never Used  Substance Use Topics  .  Alcohol use: No    ALLERGIES:  Allergies  Allergen Reactions  . Influenza Vaccines Anaphylaxis  . Amoxicillin Other (See Comments)    colitis  . Augmentin [Amoxicillin-Pot Clavulanate] Other (See  Comments)    colitis  . Cefadroxil Other (See Comments)    Reported by [facility] 2013 - unknown reaction  . Cephalexin Other (See Comments)    colitis  . Erythromycin Other (See Comments)    colitis  . Niacin And Related Other (See Comments)    colitis  . Nitrofurantoin Macrocrystal Other (See Comments)    Reported by [facility] 2013 - unknown reaction     PERTINENT MEDICATIONS:  Outpatient Encounter Medications as of 05/22/2019  Medication Sig  . acetaminophen (TYLENOL) 500 MG tablet Take 500 mg by mouth 4 (four) times daily as needed.  Marland Kitchen albuterol (PROVENTIL HFA;VENTOLIN HFA) 108 (90 Base) MCG/ACT inhaler Inhale 2 puffs into the lungs every 6 (six) hours as needed for wheezing or shortness of breath.  Marland Kitchen amLODipine (NORVASC) 5 MG tablet Take 1 tablet (5 mg total) by mouth daily.  Marland Kitchen aspirin EC 81 MG tablet Take 81 mg by mouth daily.  . budesonide-formoterol (SYMBICORT) 160-4.5 MCG/ACT inhaler Inhale 2 puffs into the lungs 2 (two) times daily.  . Cholecalciferol (VITAMIN D3 PO) Take 8,000 Units by mouth daily.   . clopidogrel (PLAVIX) 75 MG tablet Take 1 tablet (75 mg total) by mouth daily.  Marland Kitchen Dextran 70-Hypromellose (NATURAL BALANCE TEARS OP) Apply to eye. 1.4%, 2 drops each eye  . ezetimibe (ZETIA) 10 MG tablet TAKE 1 TAB BY MOUTH EVERY DAY  . ferrous sulfate 325 (65 FE) MG tablet Take 1 tablet by mouth every 1-2 days(depending on tolerance) with vit c.  . fluticasone (FLONASE) 50 MCG/ACT nasal spray Place 2 sprays into both nostrils daily.  . furosemide (LASIX) 20 MG tablet Take 1 tablet (20 mg total) by mouth daily as needed for edema.  Marland Kitchen guaiFENesin (MUCINEX) 600 MG 12 hr tablet Take by mouth 2 (two) times daily.  . insulin aspart (NOVOLOG FLEXPEN) 100 UNIT/ML FlexPen Sliding scale: BS<150 no insulin, 151-180 give 4U,181-220 give 6U,221-260 give 8U,261-300 give 10U,301-340 give 12 U,and > 341 give 14 u.  . Insulin Glargine (LANTUS SOLOSTAR) 100 UNIT/ML Solostar Pen Inject 20  Units into the skin at bedtime.  . isosorbide mononitrate (IMDUR) 30 MG 24 hr tablet Take 30 mg by mouth daily.   Marland Kitchen lamoTRIgine (LAMICTAL) 200 MG tablet Take 1.5 tablets (300 mg total) by mouth See admin instructions. Take 1 1/2 tablets (300 mg) by mouth twice daily - 8am and 2pm  . losartan (COZAAR) 50 MG tablet Take 1 tablet (50 mg total) by mouth daily.  . metoprolol tartrate (LOPRESSOR) 50 MG tablet TAKE 1 TAB BY MOUTH TWICE DAILY  . Omega-3 Fatty Acids (FISH OIL) 1000 MG CAPS Take 2,000 mg by mouth daily.  . phenytoin (DILANTIN) 100 MG ER capsule Take 3 capsules (300 mg total) by mouth daily.  . phenytoin (DILANTIN) 30 MG ER capsule Take 1 capsule (30 mg total) by mouth at bedtime.  . rosuvastatin (CRESTOR) 40 MG tablet Take 40 mg by mouth daily.  Marland Kitchen Spacer/Aero Chamber Mouthpiece MISC 1 Device by Does not apply route daily as needed.  Marland Kitchen spironolactone (ALDACTONE) 50 MG tablet Take 1 tablet (50 mg total) by mouth daily.  Marland Kitchen tiotropium (SPIRIVA HANDIHALER) 18 MCG inhalation capsule Place 1 capsule (18 mcg total) into inhaler and inhale daily.  Marland Kitchen tiZANidine (ZANAFLEX) 4 MG  tablet Take 0.5 tablets (2 mg total) by mouth every 12 (twelve) hours as needed for muscle spasms.  . White Petrolatum-Mineral Oil (ARTIFICIAL TEARS) ointment Place into both eyes as needed.   No facility-administered encounter medications on file as of 05/22/2019.    PHYSICAL EXAM/ROM:   General:cooperative, not in acute distress Cardiovascular: regular rate and rhythm; denies chest pain/discomfort Pulmonary:normal respiratory effort, no SOB, no adventitious lung sounds Abdomen: soft, nontender, + bowel soundsin all quadrants GU: no suprapubic tenderness Extremities: no edema, no joint deformities Skin: no rasheson exposed skin Neurological: Weakness otherwise nonfocal, forgetful  Teodoro Spray, NP

## 2019-05-23 DIAGNOSIS — F319 Bipolar disorder, unspecified: Secondary | ICD-10-CM | POA: Diagnosis not present

## 2019-05-23 DIAGNOSIS — Z794 Long term (current) use of insulin: Secondary | ICD-10-CM | POA: Diagnosis not present

## 2019-05-23 DIAGNOSIS — Z8673 Personal history of transient ischemic attack (TIA), and cerebral infarction without residual deficits: Secondary | ICD-10-CM | POA: Diagnosis not present

## 2019-05-23 DIAGNOSIS — I10 Essential (primary) hypertension: Secondary | ICD-10-CM | POA: Diagnosis not present

## 2019-05-23 DIAGNOSIS — I2510 Atherosclerotic heart disease of native coronary artery without angina pectoris: Secondary | ICD-10-CM | POA: Diagnosis not present

## 2019-05-23 DIAGNOSIS — J449 Chronic obstructive pulmonary disease, unspecified: Secondary | ICD-10-CM | POA: Diagnosis not present

## 2019-05-23 DIAGNOSIS — R569 Unspecified convulsions: Secondary | ICD-10-CM | POA: Diagnosis not present

## 2019-05-23 DIAGNOSIS — E1151 Type 2 diabetes mellitus with diabetic peripheral angiopathy without gangrene: Secondary | ICD-10-CM | POA: Diagnosis not present

## 2019-05-23 DIAGNOSIS — Z9181 History of falling: Secondary | ICD-10-CM | POA: Diagnosis not present

## 2019-05-23 DIAGNOSIS — I252 Old myocardial infarction: Secondary | ICD-10-CM | POA: Diagnosis not present

## 2019-05-23 DIAGNOSIS — E782 Mixed hyperlipidemia: Secondary | ICD-10-CM | POA: Diagnosis not present

## 2019-05-23 DIAGNOSIS — R41841 Cognitive communication deficit: Secondary | ICD-10-CM | POA: Diagnosis not present

## 2019-05-23 DIAGNOSIS — M1990 Unspecified osteoarthritis, unspecified site: Secondary | ICD-10-CM | POA: Diagnosis not present

## 2019-05-23 DIAGNOSIS — E279 Disorder of adrenal gland, unspecified: Secondary | ICD-10-CM | POA: Diagnosis not present

## 2019-05-23 DIAGNOSIS — Z87891 Personal history of nicotine dependence: Secondary | ICD-10-CM | POA: Diagnosis not present

## 2019-05-28 ENCOUNTER — Telehealth: Payer: Self-pay | Admitting: *Deleted

## 2019-05-28 NOTE — Telephone Encounter (Signed)
Abigail Butts called after hours line. Abigail Butts needs to get verbal consent for speech therapy once a week x6 weeks for patient. Please call as soon as possible and message can be left on voice mail stating yes or no.

## 2019-05-28 NOTE — Telephone Encounter (Signed)
Spoke to Foster and gave verbal orders for speech therapy as requested.

## 2019-05-28 NOTE — Telephone Encounter (Signed)
It is okay to give verbal authorization to requested services. Thanks, BJ 

## 2019-05-31 ENCOUNTER — Ambulatory Visit: Payer: HMO

## 2019-08-03 DIAGNOSIS — R319 Hematuria, unspecified: Secondary | ICD-10-CM | POA: Diagnosis not present

## 2019-08-03 DIAGNOSIS — N390 Urinary tract infection, site not specified: Secondary | ICD-10-CM | POA: Diagnosis not present

## 2019-08-09 ENCOUNTER — Telehealth: Payer: Self-pay | Admitting: Family Medicine

## 2019-08-09 NOTE — Telephone Encounter (Signed)
Alma with [facility] is calling to see if the [place] form that was faxed today can be faxed back she is aware that the provider is out of the office and will be returning on 08/10/2019 and will be able to complete it at that time.  Alma would like to see if there is a previous signed [place] form on file they would like to have that as well due to the State being on their premises asking for it.  Alma stated the fax number is at the top of the form that they faxed to [place].

## 2019-08-10 ENCOUNTER — Non-Acute Institutional Stay: Payer: HMO | Admitting: Hospice

## 2019-08-10 ENCOUNTER — Telehealth: Payer: Self-pay | Admitting: Family Medicine

## 2019-08-10 ENCOUNTER — Other Ambulatory Visit: Payer: Self-pay

## 2019-08-10 DIAGNOSIS — Z515 Encounter for palliative care: Secondary | ICD-10-CM | POA: Diagnosis not present

## 2019-08-10 DIAGNOSIS — J449 Chronic obstructive pulmonary disease, unspecified: Secondary | ICD-10-CM

## 2019-08-10 NOTE — Progress Notes (Signed)
[facility] Consult Note Telephone: [phone]  Fax: [phone]   PATIENT NAME: Kathryn Lewis DOB: 1946/01/12 MRN: [id number]  PRIMARY CARE PROVIDER:   [place], Betty G, MD  REFERRING PROVIDER:  Martin[place], Betty G, MD [place],  [place] [address]   RESPONSIBLE PARTY:Kathryn Lewis (daughter) [phone] - Legal Guardian  RECOMMENDATIONS/PLAN:Visit is to build trust and follow-up on palliative care. Patient is aDNR; Goals of care include to maximize quality of life and symptom management.   Visit consisted of counseling and education dealing with the complex and emotionally intense issues of symptom management and palliative care in the setting of serious and potentially life-threatening illness. Called Kathryn and left voicemail with call back numberPalliative care team will continue to support patient, patient's family, and medical team.   2. Symptom management:No significant decline/changes since last visit.  Patient is pleasant, chatty; she denied pain/discomfort, no coughing or shortness of breath.  No recent COPD flare.   Oxygen supplementation none needed at this time.  Patient is compliant with her breathing treatments.  Ongoing memory loss and forgetfulness noted; at baseline. Patient continues on Lantus and Novolog with no adverse reports; LastreportedA1c 5.2 01/23/2019 per chart review. Ongoing education on avoiding/limiting  concentratedsweets and empty caloric drinks. If needed, initiate hypoglycemic/hyperglycemic protocol per facility. Patient is ambulatory with her rolling walker. No report of falls or hospitalization since last visit.  Patient is compliant with her medications. Nursing with no concerns. Encouraged ongoing nursing care. 3. Follow up Palliative Care Visit: Palliative care will continue to follow for goals of care clarification and symptom management. I spent  31minutes providing this  consultation, time includes time spent with patient, chart review and documentation.More than 50% of the time in this consultation was spent coordinating communication.  HISTORY OF PRESENT ILLNESS:Kathryn Lewis a 20year oldfemalewith multiple medical problems including HTN,Type 2 DM,HLD, CAD, s/pCVA, gait disorder, STML, COPD, OSA. Palliative Care was asked to help address goals of care.care   CODE STATUS: DNR  PPS: 50% HOSPICE ELIGIBILITY/DIAGNOSIS: TBD  PAST MEDICAL HISTORY:  Past Medical History:  Diagnosis Date  . Allergy   . Arthritis   . Bipolar 1 disorder ([place])   . Blood transfusion without reported diagnosis   . Carotid artery stenosis    50-69% bilateral ICA stenoses by velocity criteria but no visible plaque and ICA/CCA ratio 1.72 on right and 1.38 on left  . Coronary artery disease   . Depression   . Diabetes mellitus without complication ([place])   . Echocardiogram    Echo 06/2018: Hyperdynamic systolic function, EF >86, mild concentric LVH, elevated LVEDP (E/E' suggests impaired relaxation), mild AI, lipomatous interatrial septum  . Hypertension   . Left adrenal mass (HCC)    2.5 cm L adrenal mass on CT per note of Dr. Seleta Rhymes 05/20/2014  . MI, old   . PAD (peripheral artery disease) ([place])    03/2014 - CT aortogram with lower extremity runoff (mild to moderate disease in common iliac arteries, complete occulusion of her bilateral external iliac arteries with heavily disease common femoral arteries. She also has disease to her SFAs bilaterally. Popliteal arteries and tibial vessel runoff appears to be adequate)  . Seizures ([place])   . Stroke [facility])     SOCIAL HX:  Social History   Tobacco Use  . Smoking status: Former Smoker    Packs/day: 1.00    Types: Cigarettes    Quit date: 2015    Years  since quitting: 6.5  . Smokeless tobacco: Never Used  Substance Use Topics  . Alcohol use: No    ALLERGIES:  Allergies  Allergen Reactions  . Influenza Vaccines  Anaphylaxis  . Amoxicillin Other (See Comments)    colitis  . Augmentin [Amoxicillin-Pot Clavulanate] Other (See Comments)    colitis  . Cefadroxil Other (See Comments)    Reported by [facility] 2013 - unknown reaction  . Cephalexin Other (See Comments)    colitis  . Erythromycin Other (See Comments)    colitis  . Niacin And Related Other (See Comments)    colitis  . Nitrofurantoin Macrocrystal Other (See Comments)    Reported by [facility] 2013 - unknown reaction     PERTINENT MEDICATIONS:  Outpatient Encounter Medications as of 08/10/2019  Medication Sig  . acetaminophen (TYLENOL) 500 MG tablet Take 500 mg by mouth 4 (four) times daily as needed.  Marland Kitchen albuterol (PROVENTIL HFA;VENTOLIN HFA) 108 (90 Base) MCG/ACT inhaler Inhale 2 puffs into the lungs every 6 (six) hours as needed for wheezing or shortness of breath.  Marland Kitchen amLODipine (NORVASC) 5 MG tablet Take 1 tablet (5 mg total) by mouth daily.  Marland Kitchen aspirin EC 81 MG tablet Take 81 mg by mouth daily.  . budesonide-formoterol (SYMBICORT) 160-4.5 MCG/ACT inhaler Inhale 2 puffs into the lungs 2 (two) times daily.  . Cholecalciferol (VITAMIN D3 PO) Take 8,000 Units by mouth daily.   . clopidogrel (PLAVIX) 75 MG tablet Take 1 tablet (75 mg total) by mouth daily.  Marland Kitchen Dextran 70-Hypromellose (NATURAL BALANCE TEARS OP) Apply to eye. 1.4%, 2 drops each eye  . ezetimibe (ZETIA) 10 MG tablet TAKE 1 TAB BY MOUTH EVERY DAY  . ferrous sulfate 325 (65 FE) MG tablet Take 1 tablet by mouth every 1-2 days(depending on tolerance) with vit c.  . fluticasone (FLONASE) 50 MCG/ACT nasal spray Place 2 sprays into both nostrils daily.  . furosemide (LASIX) 20 MG tablet Take 1 tablet (20 mg total) by mouth daily as needed for edema.  Marland Kitchen guaiFENesin (MUCINEX) 600 MG 12 hr tablet Take by mouth 2 (two) times daily.  . insulin aspart (NOVOLOG FLEXPEN) 100 UNIT/ML FlexPen Sliding scale: BS<150 no insulin, 151-180 give 4U,181-220 give 6U,221-260 give 8U,261-300 give  10U,301-340 give 12 U,and > 341 give 14 u.  . Insulin Glargine (LANTUS SOLOSTAR) 100 UNIT/ML Solostar Pen Inject 20 Units into the skin at bedtime.  . isosorbide mononitrate (IMDUR) 30 MG 24 hr tablet Take 30 mg by mouth daily.   Marland Kitchen lamoTRIgine (LAMICTAL) 200 MG tablet Take 1.5 tablets (300 mg total) by mouth See admin instructions. Take 1 1/2 tablets (300 mg) by mouth twice daily - 8am and 2pm  . losartan (COZAAR) 50 MG tablet Take 1 tablet (50 mg total) by mouth daily.  . metoprolol tartrate (LOPRESSOR) 50 MG tablet TAKE 1 TAB BY MOUTH TWICE DAILY  . Omega-3 Fatty Acids (FISH OIL) 1000 MG CAPS Take 2,000 mg by mouth daily.  . phenytoin (DILANTIN) 100 MG ER capsule Take 3 capsules (300 mg total) by mouth daily.  . phenytoin (DILANTIN) 30 MG ER capsule Take 1 capsule (30 mg total) by mouth at bedtime.  . rosuvastatin (CRESTOR) 40 MG tablet Take 40 mg by mouth daily.  Marland Kitchen Spacer/Aero Chamber Mouthpiece MISC 1 Device by Does not apply route daily as needed.  Marland Kitchen spironolactone (ALDACTONE) 50 MG tablet Take 1 tablet (50 mg total) by mouth daily.  Marland Kitchen tiotropium (SPIRIVA HANDIHALER) 18 MCG inhalation capsule Place 1  capsule (18 mcg total) into inhaler and inhale daily.  Marland Kitchen tiZANidine (ZANAFLEX) 4 MG tablet Take 0.5 tablets (2 mg total) by mouth every 12 (twelve) hours as needed for muscle spasms.  . White Petrolatum-Mineral Oil (ARTIFICIAL TEARS) ointment Place into both eyes as needed.   No facility-administered encounter medications on file as of 08/10/2019.    PHYSICAL EXAM/ROS:  General:cooperative, notin acute distress Cardiovascular: regular rate and rhythm; denies chest pain/discomfort Pulmonary:normal respiratory effort, no SOB, no adventitious lung sounds Abdomen: soft, nontender, + bowel soundsin all quadrants GU: no suprapubic tenderness Extremities: no edema, no joint deformities Skin: no rasheson exposed skin Neurological: Weaknessotherwisenonfocal, forgetful   Teodoro Spray,  NP

## 2019-08-10 NOTE — Telephone Encounter (Signed)
I called and spoke with [place]. All questions answered & nothing further needed at this time.

## 2019-08-10 NOTE — Telephone Encounter (Signed)
Kathryn Lewis [phone] Division of Health Services  She is calling to going over medications that the patient is taking  Omega-3 Fatty Acids (FISH OIL) 1000 MG CAPS  metoprolol tartrate (LOPRESSOR) 50 MG tablet  phenytoin (DILANTIN) 100 MG ER capsule  phenytoin (DILANTIN) 30 MG ER capsule  ferrous sulfate 325 (65 FE) MG tablet  Cholecalciferol (VITAMIN D3 PO)  Dextran 70-Hypromellose (NATURAL BALANCE TEARS OP  Please advise

## 2019-08-10 NOTE — Telephone Encounter (Signed)
Form received, placed in provider's box to be signed.

## 2019-08-10 NOTE — Telephone Encounter (Signed)
Form faxed back.

## 2019-08-13 ENCOUNTER — Telehealth: Payer: Self-pay | Admitting: Neurology

## 2019-08-13 NOTE — Telephone Encounter (Signed)
Spoke with Kathryn Lewis from the state Division of Health in [place] MRs Engdahl missed 11days of her 30mg  of Dilantin, in July she has missed 7 days of the 300mg  of her Dilantin, no Seizures have been charted while missing medication Dr Delice Lesch informed that pt has missed medication,

## 2019-08-13 NOTE — Telephone Encounter (Signed)
Kathryn Lewis from the [facility] called in wanting to speak with someone regarding the patient's dilantin and phenytoin sodium medications.

## 2019-09-06 ENCOUNTER — Other Ambulatory Visit: Payer: Self-pay | Admitting: Family Medicine

## 2019-09-06 DIAGNOSIS — I119 Hypertensive heart disease without heart failure: Secondary | ICD-10-CM

## 2019-09-06 DIAGNOSIS — I10 Essential (primary) hypertension: Secondary | ICD-10-CM

## 2019-10-02 ENCOUNTER — Other Ambulatory Visit: Payer: Self-pay | Admitting: Family Medicine

## 2019-10-02 ENCOUNTER — Other Ambulatory Visit: Payer: Self-pay

## 2019-10-02 ENCOUNTER — Non-Acute Institutional Stay: Payer: HMO | Admitting: Hospice

## 2019-10-02 DIAGNOSIS — Z515 Encounter for palliative care: Secondary | ICD-10-CM

## 2019-10-02 DIAGNOSIS — I2510 Atherosclerotic heart disease of native coronary artery without angina pectoris: Secondary | ICD-10-CM

## 2019-10-02 DIAGNOSIS — J449 Chronic obstructive pulmonary disease, unspecified: Secondary | ICD-10-CM

## 2019-10-02 NOTE — Progress Notes (Signed)
[facility] Consult Note Telephone: [phone]  Fax: [phone]  PATIENT NAME: Kathryn Lewis DOB: 12/02/1945 MRN: [id number]  PRIMARY CARE PROVIDER:   Martinique, Betty G, MD Martinique, Betty G, MD [place],  [place] [address]  REFERRING PROVIDER: Martinique, Betty G, MD Martinique, Betty G, MD [address] [place],  [place] [address]  RESPONSIBLE PARTY:  Kathryn Lewis (daughter) [phone]- Legal Guardian    RECOMMENDATIONS/PLAN:   Advance Care Planning: Visit at the request of   for palliative consult. Visit consisted of building trust and discussions on Palliative Medicine as specialized medical care for people living with serious illness, aimed at facilitating better quality of life through symptoms relief, assisting with advance care plan and establishing goals of care.  Visit consisted of discussions dealing with the complex and emotionally intense issues of symptom management and palliative care in the setting of serious and potentially life-threatening illness.  Patient enjoys coloring and and said had faith in God helps out to her challenges.   Code Status: Reviewed CODE STATUS.  Patient is a DO NOT RESUSCITATE  Goals of Care: Goals of care include to maximize quality of life and symptom management.  NP called Kathryn Lewis and left her a voicemail with callback number.  Palliative care will continue to support patient, family, and the medical team.  Follow up: Palliative care will continue to follow patient for goals of care clarification and symptom management.  Symptom management: No complaints from patient and from nursing staff.  No significant decline/changes since last visit.  Patientispleasant, chatty; she denied pain/discomfort, no coughing or shortness of breath.  No recent COPD flare.  Patient continues on insulin for type 2 diabetes mellitus.  LastreportedA1c9.2 Feb 2021per chart review.Ongoing education  on avoiding/limitingconcentratedsweetsand empty caloric drinks.If needed, initiate hypoglycemic/hyperglycemic protocol per facility.  Patient is ambulatory with her rolling walker.No report of falls or hospitalization since last visit.   Fall precautions discussed.   Patient is compliant with her medications.Nursing with no concerns. Encouraged ongoing nursing care.  Family /Caregiver/Community Supports: Patient in an Assisted Living facility, participates in activites. Her daughter Kathryn Lewis is involved in her care and comes to visit; patient expecting her today.  I spent 48 minutes providing this consultation; time iincludes time spent with patient/family, chart review, provider coordination,  and documentation. More than 50% of the time in this consultation was spent on coordinating communication  HISTORY OF PRESENT ILLNESS: Kathryn Lee Harveyis a 69year oldfemalewith multiple medical problems including HTN,Type 2 DM,HLD, CAD, s/pCVA, gait disorder, STML, COPD, OSA. Palliative Care was asked to help address goals of care.care   CODE STATUS: DNR  PPS: 50% HOSPICE ELIGIBILITY/DIAGNOSIS: TBD  PAST MEDICAL HISTORY:  Past Medical History:  Diagnosis Date  . Allergy   . Arthritis   . Bipolar 1 disorder ([facility])   . Blood transfusion without reported diagnosis   . Carotid artery stenosis    50-69% bilateral ICA stenoses by velocity criteria but no visible plaque and ICA/CCA ratio 1.72 on right and 1.38 on left  . Coronary artery disease   . Depression   . Diabetes mellitus without complication ([facility])   . Echocardiogram    Echo 06/2018: Hyperdynamic systolic function, EF >09, mild concentric LVH, elevated LVEDP (E/E' suggests impaired relaxation), mild AI, lipomatous interatrial septum  . Hypertension   . Left adrenal mass ([facility])    2.5 cm L adrenal mass on CT per note of Dr. Seleta Rhymes 05/20/2014  . MI,  old   . PAD (peripheral artery disease) ([facility])    03/2014 - CT aortogram with lower  extremity runoff (mild to moderate disease in common iliac arteries, complete occulusion of her bilateral external iliac arteries with heavily disease common femoral arteries. She also has disease to her SFAs bilaterally. Popliteal arteries and tibial vessel runoff appears to be adequate)  . Seizures ([facility])   . Stroke [facility])     SOCIAL HX:  Social History   Tobacco Use  . Smoking status: Former Smoker    Packs/day: 1.00    Types: Cigarettes    Quit date: 2015    Years since quitting: 6.6  . Smokeless tobacco: Never Used  Substance Use Topics  . Alcohol use: No    ALLERGIES:  Allergies  Allergen Reactions  . Influenza Vaccines Anaphylaxis  . Amoxicillin Other (See Comments)    colitis  . Augmentin [Amoxicillin-Pot Clavulanate] Other (See Comments)    colitis  . Cefadroxil Other (See Comments)    Reported by [facility] 2013 - unknown reaction  . Cephalexin Other (See Comments)    colitis  . Erythromycin Other (See Comments)    colitis  . Niacin And Related Other (See Comments)    colitis  . Nitrofurantoin Macrocrystal Other (See Comments)    Reported by [facility] 2013 - unknown reaction     PERTINENT MEDICATIONS:  Outpatient Encounter Medications as of 10/02/2019  Medication Sig  . acetaminophen (TYLENOL) 500 MG tablet Take 500 mg by mouth 4 (four) times daily as needed.  Marland Kitchen albuterol (PROVENTIL HFA;VENTOLIN HFA) 108 (90 Base) MCG/ACT inhaler Inhale 2 puffs into the lungs every 6 (six) hours as needed for wheezing or shortness of breath.  Marland Kitchen amLODipine (NORVASC) 5 MG tablet TAKE ONE TABLET BY MOUTH EVERY DAY  . aspirin EC 81 MG tablet Take 81 mg by mouth daily.  . budesonide-formoterol (SYMBICORT) 160-4.5 MCG/ACT inhaler Inhale 2 puffs into the lungs 2 (two) times daily.  . Cholecalciferol (VITAMIN D) 50 MCG (2000 UT) tablet TAKE ONE TABLET BY MOUTH EVERY DAY  . Cholecalciferol (VITAMIN D3 PO) Take 8,000 Units by mouth daily.   . clopidogrel (PLAVIX) 75 MG tablet TAKE  ONE TABLET BY MOUTH EVERY DAY  . Dextran 70-Hypromellose (NATURAL BALANCE TEARS OP) Apply to eye. 1.4%, 2 drops each eye  . ezetimibe (ZETIA) 10 MG tablet TAKE ONE TABLET BY MOUTH EVERY DAY  . ferrous sulfate 325 (65 FE) MG tablet Take 1 tablet by mouth every 1-2 days(depending on tolerance) with vit c.  . fluticasone (FLONASE) 50 MCG/ACT nasal spray Place 2 sprays into both nostrils daily.  . furosemide (LASIX) 20 MG tablet Take 1 tablet (20 mg total) by mouth daily as needed for edema.  Marland Kitchen guaiFENesin (MUCINEX) 600 MG 12 hr tablet Take by mouth 2 (two) times daily.  . insulin aspart (NOVOLOG FLEXPEN) 100 UNIT/ML FlexPen Sliding scale: BS<150 no insulin, 151-180 give 4U,181-220 give 6U,221-260 give 8U,261-300 give 10U,301-340 give 12 U,and > 341 give 14 u.  . Insulin Glargine (LANTUS SOLOSTAR) 100 UNIT/ML Solostar Pen Inject 20 Units into the skin at bedtime.  . isosorbide mononitrate (IMDUR) 30 MG 24 hr tablet TAKE ONE TABLET BY MOUTH EVERY DAY  . lamoTRIgine (LAMICTAL) 200 MG tablet Take 1.5 tablets (300 mg total) by mouth See admin instructions. Take 1 1/2 tablets (300 mg) by mouth twice daily - 8am and 2pm  . losartan (COZAAR) 50 MG tablet TAKE ONE TABLET BY MOUTH EVERY DAY  . metoprolol  tartrate (LOPRESSOR) 50 MG tablet TAKE ONE TABLET BY MOUTH TWICE DAILY  . Omega-3 Fatty Acids (FISH OIL) 1000 MG CAPS TAKE TWO CAPSULES BY MOUTH EVERY DAY  . phenytoin (DILANTIN) 100 MG ER capsule Take 3 capsules (300 mg total) by mouth daily.  . phenytoin (DILANTIN) 30 MG ER capsule Take 1 capsule (30 mg total) by mouth at bedtime.  . rosuvastatin (CRESTOR) 40 MG tablet Take 40 mg by mouth daily.  Marland Kitchen Spacer/Aero Chamber Mouthpiece MISC 1 Device by Does not apply route daily as needed.  Marland Kitchen spironolactone (ALDACTONE) 50 MG tablet TAKE ONE TABLET BY MOUTH EVERY DAY  . tiotropium (SPIRIVA HANDIHALER) 18 MCG inhalation capsule Place 1 capsule (18 mcg total) into inhaler and inhale daily.  Marland Kitchen tiZANidine (ZANAFLEX) 4  MG tablet Take 0.5 tablets (2 mg total) by mouth every 12 (twelve) hours as needed for muscle spasms.  . White Petrolatum-Mineral Oil (ARTIFICIAL TEARS) ointment Place into both eyes as needed.   No facility-administered encounter medications on file as of 10/02/2019.    PHYSICAL EXAM/ROS:  General:cooperative, notin acute distress Cardiovascular: regular rate and rhythm; denies chest pain/discomfort Pulmonary:normal respiratory effort, no SOB, no adventitious lung sounds; normal respiratory effort on room air; oxygen saturation 98% Abdomen: soft, nontender, + bowel soundsin all quadrants GU: no suprapubic tenderness Extremities: no edema, no joint deformities Skin: no rasheson exposed skin Neurological: Weaknessotherwisenonfocal, forgetful  Teodoro Spray, NP

## 2019-10-05 ENCOUNTER — Other Ambulatory Visit: Payer: Self-pay | Admitting: Family Medicine

## 2019-10-11 ENCOUNTER — Other Ambulatory Visit: Payer: Self-pay | Admitting: Family Medicine

## 2019-10-29 ENCOUNTER — Other Ambulatory Visit: Payer: Self-pay

## 2019-10-29 ENCOUNTER — Ambulatory Visit: Payer: Self-pay

## 2019-10-29 NOTE — Patient Outreach (Signed)
  [facility]) Care Management Chronic Special Needs Program    10/29/2019  Name: Kathryn Lewis, DOB: Jun 08, 1945  MRN: [id number]   Ms. Kathryn Lewis is enrolled in a chronic special needs plan for Diabetes. Telephone call to client for CSNP assessment follow up. Unable to reach. HIPAA compliant voice message left with call back phone number and return call request.   PLAN:  RNCM will attempt 2nd telephone call to client in 2 weeks.   Quinn Plowman RN,BSN,CCM [facility] [phone]

## 2019-11-02 ENCOUNTER — Other Ambulatory Visit: Payer: Self-pay

## 2019-11-02 NOTE — Patient Outreach (Signed)
  [facility]) Care Management Chronic Special Needs Program    11/02/2019  Name: Amayrani Bennick, DOB: 1945-03-07  MRN: [id number]   Ms. Christiana Boan is enrolled in a chronic special needs plan for Diabetes.Telephone call to client's daughter, Jakerra Floyd.  Unable to reach. HIPAA compliant voice message left with call back phone number and return call request.   PLAN; RNCM will attempt 3rd telephone outreach call in 1 week.    Quinn Plowman RN,BSN,CCM [facility] [phone]

## 2019-11-05 ENCOUNTER — Other Ambulatory Visit: Payer: Self-pay

## 2019-11-05 NOTE — Patient Outreach (Addendum)
Kathryn Lewis) Care Management Chronic Special Needs Program  11/05/2019  Name: Kathryn Lewis DOB: Mar 05, 1945  MRN: [id number]  Kathryn Lewis is enrolled in a chronic special needs plan for Diabetes Telephone call to client's daughter, Kathryn Lewis.  Unable to reach. HIPAA compliant voice message left with call back phone number and return call request. Individualized care plan  Reviewed and updated based on available data.    Goals Addressed            This Visit's Progress   . Client verbalize knowledge of Heart Failure disease self management skills in 6 months   On track    Unable to establish contact with daughter to discuss.  Review [employer] calendar sent in the mail for Heart failure information/ action plan Weigh daily and record weights  Pay attention to your body if you have any shortness of breath, swelling in you feet, ankles, legs, or your waistband gets tight.  Notify your doctor for these symptoms Call you doctor if you gain 3 lbs overnight or 5 lbs in a week Take your medications as prescribed.     . Client will not report change from baseline and no repeated symptoms of stroke with in the next 6 months   On track    Unable to establish contact with client's daughter to discuss. Continue to take your medications as prescribed.  Adhere to lifestyle changes: exercise, low salt diet, heart healthy diet,  Know the signs / symptoms of stroke: /Call 911 for these symptoms - visual disturbance (  blurred, double, sudden loss of vision: - speech disturbance ( difficulty speaking, slurred speech, or speech loss) - Limbs ( sudden numbness or weakness in arms/ legs) - facial ( muscle weakness or numbness)  - Whole body (Fatigue, LIghtheadedness, dizziness)      . Client will report no worsening of symptoms related to heart disease within the next 6 months   On track    Unable to establish contact with daughter to discuss.  RN case manager  will send client education article: Heart Disease in diabetics Control blood pressure by taking your medication if prescribed Have your cholesterol checked yearly and take medication if prescribed.  Keep your diabetes under control by diet, exercise, medication (if prescribed) Exercise if you are able to and your doctor recommends Eat a healthy diet Manage your stress    . Client will verbalize knowledge of chronic lung disease as evidenced by no ED visits or Inpatient stays related to chronic lung disease    On track    Unable to establish contact with daughter to discuss.  Review [employer] calendar sent in the mail for COPD information and action plan Contact your provider for any signs and symptoms of mild shortness of breath Call 911 if you are having severe shortness of breath.  RN case manager will send client education articles: COPD exacerbation, Adult Ed.    . Client will verbalize knowledge of self management of Hypertension as evidences by BP reading of 140/90 or less; or as defined by provider   On track    Unable to establish contact with daughter to discuss. RN case manager will send client education article: High Blood pressure in adults.  Plan to check or have your blood pressure checked regularly.  If you do not have a blood pressure monitor, one can be provided to you.  Contact your [employer] concierge for additional information. If you are  on blood pressure medication, take as prescribed.  Plan to eat low salt and heart healthy meals full of fruits, vegetables, whole grains, lean protein and limits fats and sugars.  RN case manager will send client education article: Low salt diet and Heart healthy diet.       . Client's daughter understands the importance of follow-up with providers by attending scheduled visits   On track    Unable to establish contact with client's daughter to discuss.  Most recent primary care provider visit 01/23/19 Most  recent cardiology visit 03/13/19 Continue to maintain and keep follow up appointments with your providers.      . Client's daughter will report that client sustains no fall or injuries in the next 3 months.   On track    Unable to establish contact with daughter to discuss.   Continue to follow fall precautions:  Marland Kitchen Make sure there is good lighting throughout your home . Make sure walkways are clear of clutter, cords and throw rugs . Make sure furniture is secure, sturdy and does not swivel . Grab bars are suggested near the toilet, tub and shower . Use an assistive device ( cane/ walker) if advised by your doctor.  . Turn on the lights when you go into a dark area.  Use night- lights . Keep items that you use often in easy to reach places.       . Hgb A1C <7 or as defined by provider.   On track    Hgb A1C= 5.4% on 01/23/19.  Congratulations your last Hgb A1c was at goal.   Have your Hgb A1c checked every 6 months if you are at goal or every 3 months if you are not at goal.  Review [employer] calendar sent in the mail for Diabetes Action Plan Follow diabetes self management actions:  Glucose monitoring per provider recommendation  Visit provider every 3-6 months as directed  Hbg A1C level every 3-6 months.  Carbohydrate controlled meal planning  Taking diabetes medication as prescribed by provider  Physical activity      . Maintains timely refills of diabetic medications as prescribed within the year   On track    Review of medical record medication dispense report indicates client maintains timely refills of her diabetic medications.  Continue to take your medications as prescribed.  Contact your RN case manager if you have difficulty obtaining your medications.  Contact your doctor if you have questions or concerns regarding your medications.     . Obtain annual  Lipid Profile, LDL-C   Not on track    Chart review reveal most recent lipid profile completed on  09/09/17 RN case manager will send client education article: Lipid Tests The goal for LDL is less than 70 mg/ dl as you are at high risk for complications try to avoid saturated fats, trans-fats, and eat more fiber.      . Obtain Annual Eye (retinal)  Exam    On track    Diabetic eye exam was completed on 11/08/18 Diabetes can affect your vision.  Plan to schedule your eye exam yearly     . Obtain Annual Foot Exam   Not on track    Diabetic foot exam was completed on 10/24/18 It is important that your doctor check your feet regularly.  Discuss this with your doctor at your next visit.  Diabetes can affect the nerves in your feet causing decreased feeling or numbness. If you are experiencing these symptoms,  report this to your doctor.  Continue Diabetes foot care - Check feet daily at home (look for skin color changes, cuts, sores or cracks in the skin, swelling of feet or ankles, ingrown or fungal toenails, corn or calluses). Report these findings to your doctor - Wash feet with soap and water, dry feet well especially between toes - Moisturize your feet but not between the toes - Always wear shoes that protect your whole feet.      . Obtain Hemoglobin A1C at least 2 times per year   Not on track    Hgb A1C was completed on 01/23/19  Your last documented Hgb A1c was 5.4% completed on 01/23/19.   Have your Hgb A1c checked every 6 months if you are at goal or every 3 months if you are not at goal.  RN case manager will send client education article: Hgb A1c tests Continue to keep your follow up appointments with your provider and have lab work completed as recommended.      . Visit Primary Care Provider or Endocrinologist at least 2 times per year   On track    Client completed visits with primary care provider on 10/24/18 and 01/23/19 Per chart review,  Client is being seen by [facility]  Continue to follow up with your primary care provider for yearly physical and follow up  visits        Plan:  Send successful outreach letter with a copy of their individualized care plan, Send individual care plan to provider and Send educational material  [employer] case management team will follow member moving forward with assessments, care plans and documentation.   Chronic care management coordinator will outreach in:  6 Months   Quinn Plowman RN,BSN,CCM [facility] [phone]    .

## 2019-11-26 ENCOUNTER — Other Ambulatory Visit: Payer: Self-pay | Admitting: Family Medicine

## 2019-11-26 DIAGNOSIS — E1149 Type 2 diabetes mellitus with other diabetic neurological complication: Secondary | ICD-10-CM

## 2019-11-27 ENCOUNTER — Other Ambulatory Visit: Payer: Self-pay

## 2019-11-27 ENCOUNTER — Other Ambulatory Visit: Payer: Self-pay | Admitting: Family Medicine

## 2019-11-27 ENCOUNTER — Ambulatory Visit (INDEPENDENT_AMBULATORY_CARE_PROVIDER_SITE_OTHER): Payer: HMO

## 2019-11-27 DIAGNOSIS — Z1231 Encounter for screening mammogram for malignant neoplasm of breast: Secondary | ICD-10-CM | POA: Diagnosis not present

## 2019-11-27 DIAGNOSIS — Z0000 Encounter for general adult medical examination without abnormal findings: Secondary | ICD-10-CM | POA: Diagnosis not present

## 2019-11-27 DIAGNOSIS — J441 Chronic obstructive pulmonary disease with (acute) exacerbation: Secondary | ICD-10-CM

## 2019-11-27 NOTE — Patient Instructions (Signed)
Kathryn Lewis , Thank you for taking time to come for your Medicare Wellness Visit. I appreciate your ongoing commitment to your health goals. Please review the following plan we discussed and let me know if I can assist you in the future.   Screening recommendations/referrals: Colonoscopy: Up to date, next due January 2023 Mammogram: Currently due orders placed this visit. Bone Density: Up to date, next due 09/30/2022 Recommended yearly ophthalmology/optometry visit for glaucoma screening and checkup Recommended yearly dental visit for hygiene and checkup  Vaccinations: Influenza vaccine: Currently due, you may receive at our office or your local pharmacy. Pneumococcal vaccine:Currently due, you may receive your first dose at your next office visit  Tdap vaccine: Up to date, next due 06/01/2027 Shingles vaccine: Currently due for Shingrix, you may receive this vaccine at your local pharmacy as it will be cheaper.    Advanced directives: Copies on file.  Conditions/risks identified: None   Next appointment: None   Preventive Care 65 Years and Older, Female Preventive care refers to lifestyle choices and visits with your health care provider that can promote health and wellness. What does preventive care include?  A yearly physical exam. This is also called an annual well check.  Dental exams once or twice a year.  Routine eye exams. Ask your health care provider how often you should have your eyes checked.  Personal lifestyle choices, including:  Daily care of your teeth and gums.  Regular physical activity.  Eating a healthy diet.  Avoiding tobacco and drug use.  Limiting alcohol use.  Practicing safe sex.  Taking low-dose aspirin every day.  Taking vitamin and mineral supplements as recommended by your health care provider. What happens during an annual well check? The services and screenings done by your health care provider during your annual well check will depend  on your age, overall health, lifestyle risk factors, and family history of disease. Counseling  Your health care provider may ask you questions about your:  Alcohol use.  Tobacco use.  Drug use.  Emotional well-being.  Home and relationship well-being.  Sexual activity.  Eating habits.  History of falls.  Memory and ability to understand (cognition).  Work and work Statistician.  Reproductive health. Screening  You may have the following tests or measurements:  Height, weight, and BMI.  Blood pressure.  Lipid and cholesterol levels. These may be checked every 5 years, or more frequently if you are over 41 years old.  Skin check.  Lung cancer screening. You may have this screening every year starting at age 8 if you have a 30-pack-year history of smoking and currently smoke or have quit within the past 15 years.  Fecal occult blood test (FOBT) of the stool. You may have this test every year starting at age 29.  Flexible sigmoidoscopy or colonoscopy. You may have a sigmoidoscopy every 5 years or a colonoscopy every 10 years starting at age 41.  Hepatitis C blood test.  Hepatitis B blood test.  Sexually transmitted disease (STD) testing.  Diabetes screening. This is done by checking your blood sugar (glucose) after you have not eaten for a while (fasting). You may have this done every 1-3 years.  Bone density scan. This is done to screen for osteoporosis. You may have this done starting at age 66.  Mammogram. This may be done every 1-2 years. Talk to your health care provider about how often you should have regular mammograms. Talk with your health care provider about your test results, treatment  options, and if necessary, the need for more tests. Vaccines  Your health care provider may recommend certain vaccines, such as:  Influenza vaccine. This is recommended every year.  Tetanus, diphtheria, and acellular pertussis (Tdap, Td) vaccine. You may need a Td  booster every 10 years.  Zoster vaccine. You may need this after age 34.  Pneumococcal 13-valent conjugate (PCV13) vaccine. One dose is recommended after age 35.  Pneumococcal polysaccharide (PPSV23) vaccine. One dose is recommended after age 25. Talk to your health care provider about which screenings and vaccines you need and how often you need them. This information is not intended to replace advice given to you by your health care provider. Make sure you discuss any questions you have with your health care provider. Document Released: 02/07/2015 Document Revised: 10/01/2015 Document Reviewed: 11/12/2014 Elsevier Interactive Patient Education  2017 [place] Prevention in the Home Falls can cause injuries. They can happen to people of all ages. There are many things you can do to make your home safe and to help prevent falls. What can I do on the outside of my home?  Regularly fix the edges of walkways and driveways and fix any cracks.  Remove anything that might make you trip as you walk through a door, such as a raised step or threshold.  Trim any bushes or trees on the path to your home.  Use bright outdoor lighting.  Clear any walking paths of anything that might make someone trip, such as rocks or tools.  Regularly check to see if handrails are loose or broken. Make sure that both sides of any steps have handrails.  Any raised decks and porches should have guardrails on the edges.  Have any leaves, snow, or ice cleared regularly.  Use sand or salt on walking paths during winter.  Clean up any spills in your garage right away. This includes oil or grease spills. What can I do in the bathroom?  Use night lights.  Install grab bars by the toilet and in the tub and shower. Do not use towel bars as grab bars.  Use non-skid mats or decals in the tub or shower.  If you need to sit down in the shower, use a plastic, non-slip stool.  Keep the floor dry. Clean up  any water that spills on the floor as soon as it happens.  Remove soap buildup in the tub or shower regularly.  Attach bath mats securely with double-sided non-slip rug tape.  Do not have throw rugs and other things on the floor that can make you trip. What can I do in the bedroom?  Use night lights.  Make sure that you have a light by your bed that is easy to reach.  Do not use any sheets or blankets that are too big for your bed. They should not hang down onto the floor.  Have a firm chair that has side arms. You can use this for support while you get dressed.  Do not have throw rugs and other things on the floor that can make you trip. What can I do in the kitchen?  Clean up any spills right away.  Avoid walking on wet floors.  Keep items that you use a lot in easy-to-reach places.  If you need to reach something above you, use a strong step stool that has a grab bar.  Keep electrical cords out of the way.  Do not use floor polish or wax that makes floors slippery.  If you must use wax, use non-skid floor wax.  Do not have throw rugs and other things on the floor that can make you trip. What can I do with my stairs?  Do not leave any items on the stairs.  Make sure that there are handrails on both sides of the stairs and use them. Fix handrails that are broken or loose. Make sure that handrails are as long as the stairways.  Check any carpeting to make sure that it is firmly attached to the stairs. Fix any carpet that is loose or worn.  Avoid having throw rugs at the top or bottom of the stairs. If you do have throw rugs, attach them to the floor with carpet tape.  Make sure that you have a light switch at the top of the stairs and the bottom of the stairs. If you do not have them, ask someone to add them for you. What else can I do to help prevent falls?  Wear shoes that:  Do not have high heels.  Have rubber bottoms.  Are comfortable and fit you well.  Are  closed at the toe. Do not wear sandals.  If you use a stepladder:  Make sure that it is fully opened. Do not climb a closed stepladder.  Make sure that both sides of the stepladder are locked into place.  Ask someone to hold it for you, if possible.  Clearly mark and make sure that you can see:  Any grab bars or handrails.  First and last steps.  Where the edge of each step is.  Use tools that help you move around (mobility aids) if they are needed. These include:  Canes.  Walkers.  Scooters.  Crutches.  Turn on the lights when you go into a dark area. Replace any light bulbs as soon as they burn out.  Set up your furniture so you have a clear path. Avoid moving your furniture around.  If any of your floors are uneven, fix them.  If there are any pets around you, be aware of where they are.  Review your medicines with your doctor. Some medicines can make you feel dizzy. This can increase your chance of falling. Ask your doctor what other things that you can do to help prevent falls. This information is not intended to replace advice given to you by your health care provider. Make sure you discuss any questions you have with your health care provider. Document Released: 11/07/2008 Document Revised: 06/19/2015 Document Reviewed: 02/15/2014 Elsevier Interactive Patient Education  2017 [employer].

## 2019-11-27 NOTE — Progress Notes (Signed)
Subjective:   Kathryn Lewis is a 74 y.o. female who presents for Medicare Annual (Subsequent) preventive examination.  I connected with Kathryn Lewis today by telephone and verified that I am speaking with the correct person using two identifiers. Location patient: home Location provider: work Persons participating in the virtual visit: patient, provider.   I discussed the limitations, risks, security and privacy concerns of performing an evaluation and management service by telephone and the availability of in person appointments. I also discussed with the patient that there may be a patient responsible charge related to this service. The patient expressed understanding and verbally consented to this telephonic visit.    Interactive audio and video telecommunications were attempted between this provider and patient, however failed, due to patient having technical difficulties OR patient did not have access to video capability.  We continued and completed visit with audio only.      Review of Systems    N/A  Cardiac Risk Factors include: advanced age (>65men, >82 women);diabetes mellitus;dyslipidemia;hypertension     Objective:    There were no vitals filed for this visit. There is no height or weight on file to calculate BMI.  Advanced Directives 11/27/2019 03/20/2019 03/14/2019 03/09/2019 02/22/2019 09/19/2018 09/18/2018  Does Patient Have a Medical Advance Directive? Yes Yes Yes Yes Yes Yes Yes  Type of Paramedic of [place];Living will [place];Living will;Out of facility DNR (pink MOST or yellow form) Healthcare Power of [place];Living will;Out of facility DNR (pink MOST or yellow form) [place];Living will;Out of facility DNR (pink MOST or yellow form) [place];Out of facility DNR (pink MOST or yellow form) [place];Out of facility DNR (pink MOST or  yellow form)  Does patient want to make changes to medical advance directive? - - - - No - Guardian declined - -  Copy of [place] in Chart? Yes - validated most recent copy scanned in chart (See row information) - - - - - -  Would patient like information on creating a medical advance directive? - - - - - - -  Pre-existing out of facility DNR order (yellow form or pink MOST form) - - - - - - -    Current Medications (verified) Outpatient Encounter Medications as of 11/27/2019  Medication Sig  . acetaminophen (TYLENOL) 500 MG tablet Take 500 mg by mouth 4 (four) times daily as needed.  Marland Kitchen albuterol (PROVENTIL HFA;VENTOLIN HFA) 108 (90 Base) MCG/ACT inhaler Inhale 2 puffs into the lungs every 6 (six) hours as needed for wheezing or shortness of breath.  Marland Kitchen amLODipine (NORVASC) 5 MG tablet TAKE ONE TABLET BY MOUTH EVERY DAY  . ASPIRIN LOW DOSE 81 MG EC tablet TAKE ONE TABLET BY MOUTH EVERY DAY  . budesonide-formoterol (SYMBICORT) 160-4.5 MCG/ACT inhaler Inhale 2 puffs into the lungs 2 (two) times daily.  . Cholecalciferol (VITAMIN D) 50 MCG (2000 UT) tablet TAKE ONE TABLET BY MOUTH EVERY DAY  . Cholecalciferol (VITAMIN D3 PO) Take 8,000 Units by mouth daily.   . clopidogrel (PLAVIX) 75 MG tablet TAKE ONE TABLET BY MOUTH EVERY DAY  . Dextran 70-Hypromellose (NATURAL BALANCE TEARS OP) Apply to eye. 1.4%, 2 drops each eye  . ezetimibe (ZETIA) 10 MG tablet TAKE ONE TABLET BY MOUTH EVERY DAY  . ferrous sulfate 325 (65 FE) MG tablet TAKE ONE TABLET BY MOUTH EVERY TWO DAYS with FOUR OUNCE of orange JUICE  .  fluticasone (FLONASE) 50 MCG/ACT nasal spray Place 2 sprays into both nostrils daily.  . furosemide (LASIX) 20 MG tablet Take 1 tablet (20 mg total) by mouth daily as needed for edema.  Marland Kitchen guaiFENesin (MUCINEX) 600 MG 12 hr tablet Take by mouth 2 (two) times daily.  . Insulin Aspart FlexPen 100 UNIT/ML SOPN INJECT subcutaneously THREE TIMES DAILY PER sliding scales BLOOD SUGAR (150  NO INSULIN)+ 151-180 Give FOUR units. 181-220 Give SIX units, 221-260 Give EIGHT units, 261-300 Give 10 units 301-340 Give 12 units AND > 341 Give 14 units  . isosorbide mononitrate (IMDUR) 30 MG 24 hr tablet TAKE ONE TABLET BY MOUTH EVERY DAY  . lamoTRIgine (LAMICTAL) 200 MG tablet Take 1.5 tablets (300 mg total) by mouth See admin instructions. Take 1 1/2 tablets (300 mg) by mouth twice daily - 8am and 2pm  . LANTUS SOLOSTAR 100 UNIT/ML Solostar Pen INJECT 20 units AT BEDTIME  . losartan (COZAAR) 50 MG tablet TAKE ONE TABLET BY MOUTH EVERY DAY  . metoprolol tartrate (LOPRESSOR) 50 MG tablet TAKE ONE TABLET BY MOUTH TWICE DAILY  . Omega-3 Fatty Acids (FISH OIL) 1000 MG CAPS TAKE TWO CAPSULES BY MOUTH EVERY DAY  . phenytoin (DILANTIN) 100 MG ER capsule Take 3 capsules (300 mg total) by mouth daily.  . phenytoin (DILANTIN) 30 MG ER capsule Take 1 capsule (30 mg total) by mouth at bedtime.  . rosuvastatin (CRESTOR) 40 MG tablet TAKE ONE TABLET BY MOUTH EVERY DAY  . Spacer/Aero Chamber Mouthpiece MISC 1 Device by Does not apply route daily as needed.  Marland Kitchen spironolactone (ALDACTONE) 50 MG tablet TAKE ONE TABLET BY MOUTH EVERY DAY  . tiZANidine (ZANAFLEX) 4 MG tablet Take 0.5 tablets (2 mg total) by mouth every 12 (twelve) hours as needed for muscle spasms.  . White Petrolatum-Mineral Oil (ARTIFICIAL TEARS) ointment Place into both eyes as needed.  . tiotropium (SPIRIVA HANDIHALER) 18 MCG inhalation capsule Place 1 capsule (18 mcg total) into inhaler and inhale daily.   No facility-administered encounter medications on file as of 11/27/2019.    Allergies (verified) Influenza vaccines, Amoxicillin, Augmentin [amoxicillin-pot clavulanate], Cefadroxil, Cephalexin, Erythromycin, Niacin and related, and Nitrofurantoin macrocrystal   History: Past Medical History:  Diagnosis Date  . Allergy   . Arthritis   . Bipolar 1 disorder ([place])   . Blood transfusion without reported diagnosis   . Carotid artery  stenosis    50-69% bilateral ICA stenoses by velocity criteria but no visible plaque and ICA/CCA ratio 1.72 on right and 1.38 on left  . Coronary artery disease   . Depression   . Diabetes mellitus without complication ([place])   . Echocardiogram    Echo 06/2018: Hyperdynamic systolic function, EF >09, mild concentric LVH, elevated LVEDP (E/E' suggests impaired relaxation), mild AI, lipomatous interatrial septum  . Hypertension   . Left adrenal mass ([facility])    2.5 cm L adrenal mass on CT per note of Dr. Seleta Rhymes 05/20/2014  . MI, old   . PAD (peripheral artery disease) ([place])    03/2014 - CT aortogram with lower extremity runoff (mild to moderate disease in common iliac arteries, complete occulusion of her bilateral external iliac arteries with heavily disease common femoral arteries. She also has disease to her SFAs bilaterally. Popliteal arteries and tibial vessel runoff appears to be adequate)  . Seizures ([place])   . Stroke [facility])    Past Surgical History:  Procedure Laterality Date  . BREAST SURGERY  2012   small lump nonmilignant  .  NECK SURGERY     Family History  Problem Relation Age of Onset  . Hypertension Mother   . Arthritis Mother   . Diabetes Mother   . Early death Mother   . Hyperlipidemia Mother   . Stroke Mother   . Hypertension Father   . Alcohol abuse Father   . Early death Father   . Hyperlipidemia Father   . COPD Father   . Heart attack Father   . Hypertension Sister   . Birth defects Brother   . Depression Brother   . Hyperlipidemia Brother   . Hypertension Brother   . Arthritis Daughter   . Asthma Daughter   . COPD Daughter   . Arthritis Son   . COPD Son   . Hypertension Son   . Hyperlipidemia Son   . Heart attack Son   . Arthritis Maternal Grandmother   . Cancer Maternal Grandmother    Social History   Socioeconomic History  . Marital status: Single    Spouse name: Not on file  . Number of children: 2  . Years of education: Not on file  . Highest  education level: Not on file  Occupational History  . Not on file  Tobacco Use  . Smoking status: Former Smoker    Packs/day: 1.00    Types: Cigarettes    Quit date: 2015    Years since quitting: 6.8  . Smokeless tobacco: Never Used  Vaping Use  . Vaping Use: Never used  Substance and Sexual Activity  . Alcohol use: No  . Drug use: No  . Sexual activity: Not on file  Other Topics Concern  . 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 Difficulty of Paying Living Expenses: Not hard at all  Food Insecurity: No Food Insecurity  . Worried About Charity fundraiser in the Last Year: Never true  . Ran Out of Food in the Last Year: Never true  Transportation Needs: No Transportation Needs  . Lack of Transportation (Medical): No  . Lack of Transportation (Non-Medical): No  Physical Activity: Inactive  . Days of Exercise per Week: 0 days  . Minutes of Exercise per Session: 0 min  Stress: No Stress Concern Present  . Feeling of Stress : Not at all  Social Connections: Socially Isolated  . Frequency of Communication with Friends and Family: More than three times a week  . Frequency of Social Gatherings with Friends and Family: More than three times a week  . Attends Religious Services: Never  . Active Member of Clubs or Organizations: No  . Attends Archivist Meetings: Never  . Marital Status: Widowed    Tobacco Counseling Counseling given: Not Answered   Clinical Intake:  Pre-visit preparation completed: Yes  Pain : No/denies pain     Nutritional Risks:  (constipation) Diabetes: Yes CBG done?: No Did pt. bring in CBG monitor from home?: No  How often do you need to have someone help you when you read  instructions, pamphlets, or other written materials from your doctor or pharmacy?: 5 - Always What is the last grade level you completed in school?: 9th Grade  Diabetic?Yes Nutrition Risk Assessment:  Has the patient had any N/V/D within the last 2 months?  No  Does the patient have any non-healing wounds?  No  Has the patient had any unintentional weight loss or weight gain?  No   Diabetes:  Is the patient diabetic?  Yes  If diabetic, was a CBG obtained today?  No  Did the patient bring in their glucometer from home?  No  How often do you monitor your CBG's? Patient gets blood sugars checked three times daily.   Financial Strains and Diabetes Management:  Are you having any financial strains with the device, your supplies or your medication? No .  Does the patient want to be seen by Chronic Care Management for management of their diabetes?  No  Would the patient like to be referred to a Nutritionist or for Diabetic Management?  No   Diabetic Exams:  Diabetic Eye Exam: Overdue for diabetic eye exam. Pt has been advised about the importance in completing this exam. Patient advised to call and schedule an eye exam. Diabetic Foot Exam: Overdue, Pt has been advised about the importance in completing this exam. Pt is scheduled for diabetic foot exam on next in person office visit .   Interpreter Needed?: No  Information entered by :: [place] of Daily Living In your present state of health, do you have any difficulty performing the following activities: 11/27/2019 02/22/2019  Hearing? N -  Vision? Tempie Donning  Comment has visual issues in left eye stroke affected her vision and she can no longer see to read  Difficulty concentrating or making decisions? Y Y  Comment Has issues with memory -  Walking or climbing stairs? Y Y  Dressing or bathing? N Y  Doing errands, shopping? Tempie Donning  Preparing Food and eating ? Y Y  Using the Toilet? Y Y  In the past six months, have you  accidently leaked urine? N Y  Do you have problems with loss of bowel control? N N  Managing your Medications? Y Y  Managing your Finances? Tempie Donning  Housekeeping or managing your Housekeeping? Y (No Data)  Comment - not applicable- clinet lives in an assisted living facility  Some recent data might be hidden    Patient Care Team: [place], Betty G, MD as PCP - General (Family Medicine) Fay Records, MD as PCP - Cardiology (Cardiology) Cameron Sprang, MD as Consulting Physician (Neurology) Dannielle Karvonen, RN as [place] any recent [place] you may have received from other than [facility] providers in the past year (date may be approximate).     Assessment:   This is a routine wellness examination for Kathryn Lewis.  Hearing/Vision screen  Hearing Screening   125Hz  250Hz  500Hz  1000Hz  2000Hz  3000Hz  4000Hz  6000Hz  8000Hz   Right ear:           Left ear:           Vision Screening Comments: Patient states gets checked once per year  Dietary issues and exercise activities discussed: Current Exercise Habits: The patient does not participate in regular exercise at present  Goals    . Client verbalize knowledge of Heart Failure disease self management skills in 6 months     Unable to establish contact with daughter to discuss.  Review [employer] calendar sent in the mail for Heart failure information/ action plan Weigh daily and record weights  Pay attention to your body if you have any shortness of  breath, swelling in you feet, ankles, legs, or your waistband gets tight.  Notify your doctor for these symptoms Call you doctor if you gain 3 lbs overnight or 5 lbs in a week Take your medications as prescribed.     . Client will not report change from baseline and no repeated symptoms of stroke with in the next 6 months     Unable to establish contact with client's daughter to discuss. Continue to take your medications as prescribed.  Adhere to  lifestyle changes: exercise, low salt diet, heart healthy diet,  Know the signs / symptoms of stroke: /Call 911 for these symptoms - visual disturbance (  blurred, double, sudden loss of vision: - speech disturbance ( difficulty speaking, slurred speech, or speech loss) - Limbs ( sudden numbness or weakness in arms/ legs) - facial ( muscle weakness or numbness)  - Whole body (Fatigue, LIghtheadedness, dizziness)      . Client will report no worsening of symptoms related to heart disease within the next 6 months     Unable to establish contact with daughter to discuss.  RN case manager will send client education article: Heart Disease in diabetics Control blood pressure by taking your medication if prescribed Have your cholesterol checked yearly and take medication if prescribed.  Keep your diabetes under control by diet, exercise, medication (if prescribed) Exercise if you are able to and your doctor recommends Eat a healthy diet Manage your stress    . Client will verbalize knowledge of chronic lung disease as evidenced by no ED visits or Inpatient stays related to chronic lung disease      Unable to establish contact with daughter to discuss.  Review [employer] calendar sent in the mail for COPD information and action plan Contact your provider for any signs and symptoms of mild shortness of breath Call 911 if you are having severe shortness of breath.  RN case manager will send client education articles: COPD exacerbation, Adult Ed.    . Client will verbalize knowledge of self management of Hypertension as evidences by BP reading of 140/90 or less; or as defined by provider     Unable to establish contact with daughter to discuss. RN case manager will send client education article: High Blood pressure in adults.  Plan to check or have your blood pressure checked regularly.  If you do not have a blood pressure monitor, one can be provided to you.  Contact your Health Team  advantage concierge for additional information. If you are on blood pressure medication, take as prescribed.  Plan to eat low salt and heart healthy meals full of fruits, vegetables, whole grains, lean protein and limits fats and sugars.  RN case manager will send client education article: Low salt diet and Heart healthy diet.       . Client's daughter understands the importance of follow-up with providers by attending scheduled visits     Unable to establish contact with client's daughter to discuss.  Most recent primary care provider visit 01/23/19 Most recent cardiology visit 03/13/19 Continue to maintain and keep follow up appointments with your providers.      . Client's daughter will report that client sustains no fall or injuries in the next 3 months.     Unable to establish contact with daughter to discuss.   Continue to follow fall precautions:  Marland Kitchen Make sure there is good lighting throughout your home . Make sure walkways are clear of clutter, cords and throw  rugs . Make sure furniture is secure, sturdy and does not swivel . Grab bars are suggested near the toilet, tub and shower . Use an assistive device ( cane/ walker) if advised by your doctor.  . Turn on the lights when you go into a dark area.  Use night- lights . Keep items that you use often in easy to reach places.       . Hgb A1C <7 or as defined by provider.     Hgb A1C= 5.4% on 01/23/19.  Congratulations your last Hgb A1c was at goal.   Have your Hgb A1c checked every 6 months if you are at goal or every 3 months if you are not at goal.  Review [employer] calendar sent in the mail for Diabetes Action Plan Follow diabetes self management actions:  Glucose monitoring per provider recommendation  Visit provider every 3-6 months as directed  Hbg A1C level every 3-6 months.  Carbohydrate controlled meal planning  Taking diabetes medication as prescribed by provider  Physical activity      .  Maintains timely refills of diabetic medications as prescribed within the year     Review of medical record medication dispense report indicates client maintains timely refills of her diabetic medications.  Continue to take your medications as prescribed.  Contact your RN case manager if you have difficulty obtaining your medications.  Contact your doctor if you have questions or concerns regarding your medications.     . Obtain annual  Lipid Profile, LDL-C     Chart review reveal most recent lipid profile completed on 09/09/17 RN case manager will send client education article: Lipid Tests The goal for LDL is less than 70 mg/ dl as you are at high risk for complications try to avoid saturated fats, trans-fats, and eat more fiber.      . Obtain Annual Eye (retinal)  Exam      Diabetic eye exam was completed on 11/08/18 Diabetes can affect your vision.  Plan to schedule your eye exam yearly     . Obtain Annual Foot Exam     Diabetic foot exam was completed on 10/24/18 It is important that your doctor check your feet regularly.  Discuss this with your doctor at your next visit.  Diabetes can affect the nerves in your feet causing decreased feeling or numbness. If you are experiencing these symptoms, report this to your doctor.  Continue Diabetes foot care - Check feet daily at home (look for skin color changes, cuts, sores or cracks in the skin, swelling of feet or ankles, ingrown or fungal toenails, corn or calluses). Report these findings to your doctor - Wash feet with soap and water, dry feet well especially between toes - Moisturize your feet but not between the toes - Always wear shoes that protect your whole feet.      . Obtain Hemoglobin A1C at least 2 times per year     Hgb A1C was completed on 01/23/19  Your last documented Hgb A1c was 5.4% completed on 01/23/19.   Have your Hgb A1c checked every 6 months if you are at goal or every 3 months if you are not at goal.  RN case  manager will send client education article: Hgb A1c tests Continue to keep your follow up appointments with your provider and have lab work completed as recommended.      . Patient Stated     I would like to get out of the nursing center  and go home.    . Visit Primary Care Provider or Endocrinologist at least 2 times per year     Client completed visits with primary care provider on 10/24/18 and 01/23/19 Per chart review,  Client is being seen by [facility]  Continue to follow up with your primary care provider for yearly physical and follow up visits       Depression Screen PHQ 2/9 Scores 11/27/2019 10/24/2018  PHQ - 2 Score 6 0  PHQ- 9 Score 11 8    Fall Risk Fall Risk  11/27/2019 03/20/2019 03/09/2019 10/24/2018 03/13/2018  Falls in the past year? 1 1 1 1 1   Number falls in past yr: 1 1 1 1  0  Injury with Fall? 0 0 1 0 0  Risk Factor Category  - - - - -  Risk for fall due to : History of fall(s);Impaired balance/gait;Impaired mobility - - Impaired balance/gait;Impaired mobility;Medication side effect -  Follow up Falls evaluation completed;Falls prevention discussed - - Education provided -    Any stairs in or around the home? No  If so, are there any without handrails? No  Home free of loose throw rugs in walkways, pet beds, electrical cords, etc? Yes  Adequate lighting in your home to reduce risk of falls? Yes   ASSISTIVE DEVICES UTILIZED TO PREVENT FALLS:  Life alert? Yes  Use of a cane, walker or w/c? Yes  Grab bars in the bathroom? Yes  Shower chair or bench in shower? Yes  Elevated toilet seat or a handicapped toilet? Yes     Cognitive Function:     6CIT Screen 11/27/2019  What Year? 4 points  What month? 3 points  What time? 3 points  Count back from 20 0 points  Months in reverse 4 points  Repeat phrase 10 points  Total Score 24    Immunizations Immunization History  Administered Date(s) Administered  . Moderna SARS-COVID-2 Vaccination  02/05/2019, 03/08/2019, 11/22/2019  . Tdap 05/31/2017    TDAP status: Up to date Flu Vaccine status: Declined, Education has been provided regarding the importance of this vaccine but patient still declined. Advised may receive this vaccine at local pharmacy or Health Dept. Aware to provide a copy of the vaccination record if obtained from local pharmacy or Health Dept. Verbalized acceptance and understanding. Pneumococcal vaccine status: Declined,  Education has been provided regarding the importance of this vaccine but patient still declined. Advised may receive this vaccine at local pharmacy or Health Dept. Aware to provide a copy of the vaccination record if obtained from local pharmacy or Health Dept. Verbalized acceptance and understanding.  Covid-19 vaccine status: Completed vaccines  Qualifies for Shingles Vaccine? Yes   Zostavax completed No   Shingrix Completed?: No.    Education has been provided regarding the importance of this vaccine. Patient has been advised to call insurance company to determine out of pocket expense if they have not yet received this vaccine. Advised may also receive vaccine at local pharmacy or Health Dept. Verbalized acceptance and understanding.  Screening Tests Health Maintenance  Topic Date Due  . MAMMOGRAM  01/25/2017  . HEMOGLOBIN A1C  07/24/2019  . FOOT EXAM  10/24/2019  . OPHTHALMOLOGY EXAM  11/08/2019  . COLONOSCOPY  01/25/2021  . TETANUS/TDAP  06/01/2027  . DEXA SCAN  Completed  . COVID-19 Vaccine  Completed  . Hepatitis C Screening  Completed  . INFLUENZA VACCINE  Discontinued  . PNA vac Low Risk Adult  Discontinued  Health Maintenance  Health Maintenance Due  Topic Date Due  . MAMMOGRAM  01/25/2017  . HEMOGLOBIN A1C  07/24/2019  . FOOT EXAM  10/24/2019  . OPHTHALMOLOGY EXAM  11/08/2019    Colorectal cancer screening: Completed 01/26/2011. Repeat every 10 years Mammogram status: Ordered 11/27/2019. Pt provided with contact info  and advised to call to schedule appt.  Bone Density status: Completed 09/29/2017. Results reflect: Bone density results: OSTEOPENIA. Repeat every 5 years.  Lung Cancer Screening: (Low Dose CT Chest recommended if Age 77-80 years, 30 pack-year currently smoking OR have quit w/in 15years.) does not qualify.   Lung Cancer Screening Referral: N/A   Additional Screening:  Hepatitis C Screening: does qualify; Completed 10/09/2013  Vision Screening: Recommended annual ophthalmology exams for early detection of glaucoma and other disorders of the eye. Is the patient up to date with their annual eye exam?  Yes  Who is the provider or what is the name of the office in which the patient attends annual eye exams? Patient can not able to  Remember her eye doctors name. If pt is not established with a provider, would they like to be referred to a provider to establish care? No .   Dental Screening: Recommended annual dental exams for proper oral hygiene  Community Resource Referral / Chronic Care Management: CRR required this visit?  No   CCM required this visit?  No      Plan:     I have personally reviewed and noted the following in the patient's chart:   . Medical and social history . Use of alcohol, tobacco or illicit drugs  . Current medications and supplements . Functional ability and status . Nutritional status . Physical activity . Advanced directives . List of other physicians . Hospitalizations, surgeries, and ER visits in previous 12 months . Vitals . Screenings to include cognitive, depression, and falls . Referrals and appointments  In addition, I have reviewed and discussed with patient certain preventive protocols, quality metrics, and best practice recommendations. A written personalized care plan for preventive services as well as general preventive health recommendations were provided to patient.     Kathryn Neas, LPN   16/01/958   Nurse Notes: Patient is  currently due for a foot exam. Explain that office visit with PCP is needed. Patient verbalized understanding.

## 2019-11-29 ENCOUNTER — Encounter: Payer: Self-pay | Admitting: Family Medicine

## 2019-11-29 ENCOUNTER — Telehealth: Payer: Self-pay

## 2019-11-29 NOTE — Telephone Encounter (Signed)
Patients daughter called and stated that patient has falling about 20 minutes ago 11/29/19.Kathryn Lewis patient is seeing things "snakes crawling on the floor" patients daughter stated this is just like the last time. Wanting to know if a Provider will send something in for her mom. Advised patient Dr was out of the office until Monday. [facility] told patients daughter if we send over orders for urine they can get the paperwork started     Please call and advise

## 2019-12-03 NOTE — Telephone Encounter (Signed)
I spoke with pt's daughter. Appt made for tomorrow in office at 3:30.

## 2019-12-03 NOTE — Telephone Encounter (Signed)
When this happens at her age we do not want to keep adding meds, we need to try to determine cause. If getting worse, she may need to go to the ER to evaluate for infection or other acute process. Last seen in 12/2018. Needs appt. Thanks, BJ

## 2019-12-03 NOTE — Telephone Encounter (Signed)
Pt has appt tomorrow

## 2019-12-04 ENCOUNTER — Non-Acute Institutional Stay: Payer: HMO | Admitting: Hospice

## 2019-12-04 ENCOUNTER — Other Ambulatory Visit: Payer: Self-pay

## 2019-12-04 ENCOUNTER — Ambulatory Visit: Payer: HMO | Admitting: Family Medicine

## 2019-12-04 DIAGNOSIS — J449 Chronic obstructive pulmonary disease, unspecified: Secondary | ICD-10-CM

## 2019-12-04 DIAGNOSIS — Z515 Encounter for palliative care: Secondary | ICD-10-CM

## 2019-12-04 NOTE — Progress Notes (Addendum)
[facility] Consult Note Telephone: [phone]  Fax: [phone]  PATIENT NAME: Kathryn Lewis DOB: Apr 29, 1945 MRN: [id number]  PRIMARY CARE PROVIDER:   Martinique, Betty G, MD Martinique, Betty G, MD [address] [place],  [place] [address]  REFERRING PROVIDER: Martinique, Betty G, MD Martinique, Betty G, [place],  [place] [address]  RESPONSIBLE PARTY:  Kathryn Lewis (daughter) [phone]- Legal Guardian    RECOMMENDATIONS/PLAN:   Visit consisted of building trust and discussions on Palliative Medicine as specialized medical care for people living with serious illness, aimed at facilitating better quality of life through symptoms relief, assisting with advance care plan and establishing goals of care.  NP called Kathryn Lewis and left her a voicemail with callback number.  Later, she called back to report that patient was seen by urologist today and prophylactic antibiotic for UTI has been ordered.  Advance Care Planning/Code Status: Patient is a DO NOT RESUSCITATE  Goals of Care: Goals of care include to maximize quality of life and symptom management.  Follow up: Palliative care will continue to follow patient for goals of care clarification and symptom management.  Follow-up in 2 months/as needed  Functional status/Symptom management: Patient in no acute distress.  She continues to ambulate with her rolling walker and participates in facility activities.  She denies pain/discomfort; behavior at baseline.  Nursing with no concerns.  Patient is compliant with her medications.  No recent COPD exacerbation encouraged ongoing care. Later in the day, Kathryn Lewis reported patient's daughter Kathryn Lewis called reporting that when she visited patient yesterday patient was hallucinating and she was wondering if patient has UTI.  Kathryn Lewis  to reach PCP for order for UA.  Patient has appointment with PCP today.  Palliative will  continue to monitor for symptom management/decline and make recommendations as needed.  Family /Caregiver/Community Supports:  I spent 46 minutes providing this consultation; time includes time spent with patient/family, chart review, provider coordination,  and documentation. More than 50% of the time in this consultation was spent on coordinating communication  HISTORY OF PRESENT ILLNESS:  HISTORY OF PRESENT ILLNESS: Kathryn Lee Harveyis a 50year oldfemalewith multiple medical problems including HTN,Type 2 DM,HLD, CAD, s/pCVA, gait disorder, STML, COPD, OSA. Palliative Care was asked to help address goals of care.care  CODE STATUS: DNR  PPS: 50%  HOSPICE ELIGIBILITY/DIAGNOSIS: TBD  PAST MEDICAL HISTORY:  Past Medical History:  Diagnosis Date  . Allergy   . Arthritis   . Bipolar 1 disorder ([facility])   . Blood transfusion without reported diagnosis   . Carotid artery stenosis    50-69% bilateral ICA stenoses by velocity criteria but no visible plaque and ICA/CCA ratio 1.72 on right and 1.38 on left  . Coronary artery disease   . Depression   . Diabetes mellitus without complication ([place])   . Echocardiogram    Echo 06/2018: Hyperdynamic systolic function, EF >82, mild concentric LVH, elevated LVEDP (E/E' suggests impaired relaxation), mild AI, lipomatous interatrial septum  . Hypertension   . Left adrenal mass ([facility])    2.5 cm L adrenal mass on CT per note of Dr. Seleta Rhymes 05/20/2014  . MI, old   . PAD (peripheral artery disease) ([place])    03/2014 - CT aortogram with lower extremity runoff (mild to moderate disease in common iliac arteries, complete occulusion of her bilateral external iliac arteries with heavily disease common femoral arteries. She also has disease to her SFAs bilaterally. Popliteal  arteries and tibial vessel runoff appears to be adequate)  . Seizures ([place])   . Stroke [facility])     SOCIAL HX:  Social History   Tobacco Use  . Smoking status: Former Smoker    Packs/day:  1.00    Types: Cigarettes    Quit date: 2015    Years since quitting: 6.8  . Smokeless tobacco: Never Used  Substance Use Topics  . Alcohol use: No    ALLERGIES:  Allergies  Allergen Reactions  . Influenza Vaccines Anaphylaxis  . Amoxicillin Other (See Comments)    colitis  . Augmentin [Amoxicillin-Pot Clavulanate] Other (See Comments)    colitis  . Cefadroxil Other (See Comments)    Reported by [facility] 2013 - unknown reaction  . Cephalexin Other (See Comments)    colitis  . Erythromycin Other (See Comments)    colitis  . Niacin And Related Other (See Comments)    colitis  . Nitrofurantoin Macrocrystal Other (See Comments)    Reported by [facility] 2013 - unknown reaction     PERTINENT MEDICATIONS:  Outpatient Encounter Medications as of 12/04/2019  Medication Sig  . acetaminophen (TYLENOL) 500 MG tablet Take 500 mg by mouth 4 (four) times daily as needed.  Marland Kitchen albuterol (PROVENTIL HFA;VENTOLIN HFA) 108 (90 Base) MCG/ACT inhaler Inhale 2 puffs into the lungs every 6 (six) hours as needed for wheezing or shortness of breath.  Marland Kitchen amLODipine (NORVASC) 5 MG tablet TAKE ONE TABLET BY MOUTH EVERY DAY  . ASPIRIN LOW DOSE 81 MG EC tablet TAKE ONE TABLET BY MOUTH EVERY DAY  . budesonide-formoterol (SYMBICORT) 160-4.5 MCG/ACT inhaler Inhale 2 puffs into the lungs 2 (two) times daily.  . Cholecalciferol (VITAMIN D) 50 MCG (2000 UT) tablet TAKE ONE TABLET BY MOUTH EVERY DAY  . Cholecalciferol (VITAMIN D3 PO) Take 8,000 Units by mouth daily.   . clopidogrel (PLAVIX) 75 MG tablet TAKE ONE TABLET BY MOUTH EVERY DAY  . Dextran 70-Hypromellose (NATURAL BALANCE TEARS OP) Apply to eye. 1.4%, 2 drops each eye  . ezetimibe (ZETIA) 10 MG tablet TAKE ONE TABLET BY MOUTH EVERY DAY  . ferrous sulfate 325 (65 FE) MG tablet TAKE ONE TABLET BY MOUTH EVERY TWO DAYS with FOUR OUNCE of orange JUICE  . fluticasone (FLONASE) 50 MCG/ACT nasal spray Place 2 sprays into both nostrils daily.  . furosemide  (LASIX) 20 MG tablet Take 1 tablet (20 mg total) by mouth daily as needed for edema.  Marland Kitchen guaiFENesin (MUCINEX) 600 MG 12 hr tablet Take by mouth 2 (two) times daily.  . Insulin Aspart FlexPen 100 UNIT/ML SOPN INJECT subcutaneously THREE TIMES DAILY PER sliding scales BLOOD SUGAR (150 NO INSULIN)+ 151-180 Give FOUR units. 181-220 Give SIX units, 221-260 Give EIGHT units, 261-300 Give 10 units 301-340 Give 12 units AND > 341 Give 14 units  . isosorbide mononitrate (IMDUR) 30 MG 24 hr tablet TAKE ONE TABLET BY MOUTH EVERY DAY  . lamoTRIgine (LAMICTAL) 200 MG tablet Take 1.5 tablets (300 mg total) by mouth See admin instructions. Take 1 1/2 tablets (300 mg) by mouth twice daily - 8am and 2pm  . LANTUS SOLOSTAR 100 UNIT/ML Solostar Pen INJECT 20 units AT BEDTIME  . losartan (COZAAR) 50 MG tablet TAKE ONE TABLET BY MOUTH EVERY DAY  . metoprolol tartrate (LOPRESSOR) 50 MG tablet TAKE ONE TABLET BY MOUTH TWICE DAILY  . Omega-3 Fatty Acids (FISH OIL) 1000 MG CAPS TAKE TWO CAPSULES BY MOUTH EVERY DAY  . phenytoin (DILANTIN) 100 MG ER  capsule Take 3 capsules (300 mg total) by mouth daily.  . phenytoin (DILANTIN) 30 MG ER capsule Take 1 capsule (30 mg total) by mouth at bedtime.  . rosuvastatin (CRESTOR) 40 MG tablet TAKE ONE TABLET BY MOUTH EVERY DAY  . Spacer/Aero Chamber Mouthpiece MISC 1 Device by Does not apply route daily as needed.  Marland Kitchen SPIRIVA HANDIHALER 18 MCG inhalation capsule place ONE capsule into inhaler AND inhale EVERY DAY  . spironolactone (ALDACTONE) 50 MG tablet TAKE ONE TABLET BY MOUTH EVERY DAY  . tiZANidine (ZANAFLEX) 4 MG tablet Take 0.5 tablets (2 mg total) by mouth every 12 (twelve) hours as needed for muscle spasms.  . White Petrolatum-Mineral Oil (ARTIFICIAL TEARS) ointment Place into both eyes as needed.   No facility-administered encounter medications on file as of 12/04/2019.    PHYSICAL EXAM/ROS :General:cooperative, notin acute distress Cardiovascular: regular rate and rhythm;  denies chest pain/discomfort Pulmonary:normal respiratory effort, no SOB, no adventitious lung sounds; normal respiratory effort on room air; oxygen saturation 98% Abdomen: soft, nontender, + bowel soundsin all quadrants GU: no suprapubic tenderness; no report of urinary symptoms Extremities: no edema, no joint deformities Skin: no rasheson exposed skin Neurological: Weaknessotherwisenonfocal, forgetful Teodoro Spray, NP

## 2019-12-07 ENCOUNTER — Telehealth: Payer: Self-pay

## 2019-12-07 ENCOUNTER — Other Ambulatory Visit: Payer: HMO

## 2019-12-07 NOTE — Telephone Encounter (Signed)
Confirmed with Gwen at [facility] that order for UA and C&S not yet obtained. Order re-faxed.

## 2019-12-07 NOTE — Telephone Encounter (Signed)
Patients daughter called in wanting patient to come in to get urine done. Put patient on the schedule. Let patient know she would only be coming to get lab done and not seeing the Dr. She told me to cancel the appt and she would figure it out because she's been playing cat and mouse with trying to get patient scheduled and a urine done to get patients UTI treated. Patient says provider sent orders for nursing home to be able to get urine from the patient but they wouldn't be able to do anything until Monday. Patient was intending to bring patient to office for urine and that Dr would see her and treat her. Once I told her no she would only be doing lab she asked me to cancel lab and she would go else where and figure it out on how to treat patient

## 2019-12-07 NOTE — Telephone Encounter (Signed)
Noted  

## 2019-12-18 ENCOUNTER — Non-Acute Institutional Stay: Payer: HMO | Admitting: Hospice

## 2019-12-18 DIAGNOSIS — Z515 Encounter for palliative care: Secondary | ICD-10-CM | POA: Diagnosis not present

## 2019-12-18 DIAGNOSIS — F0390 Unspecified dementia without behavioral disturbance: Secondary | ICD-10-CM

## 2019-12-18 NOTE — Progress Notes (Signed)
[facility] Consult Note Telephone: [phone]  Fax: [phone]  PATIENT NAME: Tanay Massiah DOB: 1945/02/04 MRN: [id number]  PRIMARY CARE PROVIDER:   Martinique, Betty G, MD Martinique, Betty G, MD [place],  [place] [address]  REFERRING PROVIDER: Martinique, Betty G, MD Martinique, Betty G, [place] [place] [place],  [place] [address]  RESPONSIBLE PARTY:  Ryliegh Mcduffey (daughter) [phone]- Legal Guardian     RECOMMENDATIONS/PLAN:   Visit to build trust and follow up palliative care.   Advance Care Planning/Code Status: Patient is a DO NOT RESUSCITATE  Goals of Care: Goals of care include to maximize quality of life and symptom management.  Follow up: Palliative care will continue to follow patient for goals of care clarification and symptom management. Follow in 2 months/as needed  Symptom management:  UTI: Daughter 12/04/2019 had reported that patient most likely had UTI. Order for UA C&S received by [facility] clinical Navigator Betty and faxed to facility. Today, nursing reported no urine sample was obtained as patient voids and flushes before nurse arrival. NP spoke with facility director Meredith Mody and with primary nursing staff recommending in and out catherization to facilitate order received from PCP; facility to send result to PCP asap. Patient is non toxic, afebrile, denies urinary symptoms, friendly and cooperative at baseline, continues to see snakes in her room. She said she knows the blind hooks are not snakes but they look like snake anyway. Nursing will obtain urine sample and send result to PCP. Palliative will continue to monitor for symptom management/decline and make recommendations as needed.   NP called [name] and left her  voicemail with call back number.   I spent 46  minutes providing this consultation; time includes time spent with patient/family, chart review, provider coordination,  and documentation.  More than 50% of the time in this consultation was spent on counseling and coordinating communication  HISTORY OF PRESENT ILLNESS:  Kiasia Lee Harveyis a 70year oldfemalewith multiple medical problems including HTN,Type 2 DM,HLD, CAD, s/pCVA, gait disorder, STML, COPD, OSA. Palliative Care was asked to help address goals of care.   CODE STATUS: DNR  PPS: 50%  HOSPICE ELIGIBILITY/DIAGNOSIS: TBD  PAST MEDICAL HISTORY:  Past Medical History:  Diagnosis Date  . Allergy   . Arthritis   . Bipolar 1 disorder ([place])   . Blood transfusion without reported diagnosis   . Carotid artery stenosis    50-69% bilateral ICA stenoses by velocity criteria but no visible plaque and ICA/CCA ratio 1.72 on right and 1.38 on left  . Coronary artery disease   . Depression   . Diabetes mellitus without complication ([place])   . Echocardiogram    Echo 06/2018: Hyperdynamic systolic function, EF >94, mild concentric LVH, elevated LVEDP (E/E' suggests impaired relaxation), mild AI, lipomatous interatrial septum  . Hypertension   . Left adrenal mass (HCC)    2.5 cm L adrenal mass on CT per note of Dr. Seleta Rhymes 05/20/2014  . MI, old   . PAD (peripheral artery disease) ([place])    03/2014 - CT aortogram with lower extremity runoff (mild to moderate disease in common iliac arteries, complete occulusion of her bilateral external iliac arteries with heavily disease common femoral arteries. She also has disease to her SFAs bilaterally. Popliteal arteries and tibial vessel runoff appears to be adequate)  . Seizures ([place])   . Stroke [facility])     SOCIAL HX:  Social History   Tobacco Use  .  Smoking status: Former Smoker    Packs/day: 1.00    Types: Cigarettes    Quit date: 2015    Years since quitting: 6.8  . Smokeless tobacco: Never Used  Substance Use Topics  . Alcohol use: No    ALLERGIES:  Allergies  Allergen Reactions  . Influenza Vaccines Anaphylaxis  . Amoxicillin Other (See Comments)    colitis  .  Augmentin [Amoxicillin-Pot Clavulanate] Other (See Comments)    colitis  . Cefadroxil Other (See Comments)    Reported by [facility] 2013 - unknown reaction  . Cephalexin Other (See Comments)    colitis  . Erythromycin Other (See Comments)    colitis  . Niacin And Related Other (See Comments)    colitis  . Nitrofurantoin Macrocrystal Other (See Comments)    Reported by [facility] 2013 - unknown reaction     PERTINENT MEDICATIONS:  Outpatient Encounter Medications as of 12/18/2019  Medication Sig  . acetaminophen (TYLENOL) 500 MG tablet Take 500 mg by mouth 4 (four) times daily as needed.  Marland Kitchen albuterol (PROVENTIL HFA;VENTOLIN HFA) 108 (90 Base) MCG/ACT inhaler Inhale 2 puffs into the lungs every 6 (six) hours as needed for wheezing or shortness of breath.  Marland Kitchen amLODipine (NORVASC) 5 MG tablet TAKE ONE TABLET BY MOUTH EVERY DAY  . ASPIRIN LOW DOSE 81 MG EC tablet TAKE ONE TABLET BY MOUTH EVERY DAY  . budesonide-formoterol (SYMBICORT) 160-4.5 MCG/ACT inhaler Inhale 2 puffs into the lungs 2 (two) times daily.  . Cholecalciferol (VITAMIN D) 50 MCG (2000 UT) tablet TAKE ONE TABLET BY MOUTH EVERY DAY  . Cholecalciferol (VITAMIN D3 PO) Take 8,000 Units by mouth daily.   . clopidogrel (PLAVIX) 75 MG tablet TAKE ONE TABLET BY MOUTH EVERY DAY  . Dextran 70-Hypromellose (NATURAL BALANCE TEARS OP) Apply to eye. 1.4%, 2 drops each eye  . ezetimibe (ZETIA) 10 MG tablet TAKE ONE TABLET BY MOUTH EVERY DAY  . ferrous sulfate 325 (65 FE) MG tablet TAKE ONE TABLET BY MOUTH EVERY TWO DAYS with FOUR OUNCE of orange JUICE  . fluticasone (FLONASE) 50 MCG/ACT nasal spray Place 2 sprays into both nostrils daily.  . furosemide (LASIX) 20 MG tablet Take 1 tablet (20 mg total) by mouth daily as needed for edema.  Marland Kitchen guaiFENesin (MUCINEX) 600 MG 12 hr tablet Take by mouth 2 (two) times daily.  . Insulin Aspart FlexPen 100 UNIT/ML SOPN INJECT subcutaneously THREE TIMES DAILY PER sliding scales BLOOD SUGAR (150 NO  INSULIN)+ 151-180 Give FOUR units. 181-220 Give SIX units, 221-260 Give EIGHT units, 261-300 Give 10 units 301-340 Give 12 units AND > 341 Give 14 units  . isosorbide mononitrate (IMDUR) 30 MG 24 hr tablet TAKE ONE TABLET BY MOUTH EVERY DAY  . lamoTRIgine (LAMICTAL) 200 MG tablet Take 1.5 tablets (300 mg total) by mouth See admin instructions. Take 1 1/2 tablets (300 mg) by mouth twice daily - 8am and 2pm  . LANTUS SOLOSTAR 100 UNIT/ML Solostar Pen INJECT 20 units AT BEDTIME  . losartan (COZAAR) 50 MG tablet TAKE ONE TABLET BY MOUTH EVERY DAY  . metoprolol tartrate (LOPRESSOR) 50 MG tablet TAKE ONE TABLET BY MOUTH TWICE DAILY  . Omega-3 Fatty Acids (FISH OIL) 1000 MG CAPS TAKE TWO CAPSULES BY MOUTH EVERY DAY  . phenytoin (DILANTIN) 100 MG ER capsule Take 3 capsules (300 mg total) by mouth daily.  . phenytoin (DILANTIN) 30 MG ER capsule Take 1 capsule (30 mg total) by mouth at bedtime.  . rosuvastatin (CRESTOR) 40  MG tablet TAKE ONE TABLET BY MOUTH EVERY DAY  . Spacer/Aero Chamber Mouthpiece MISC 1 Device by Does not apply route daily as needed.  Marland Kitchen SPIRIVA HANDIHALER 18 MCG inhalation capsule place ONE capsule into inhaler AND inhale EVERY DAY  . spironolactone (ALDACTONE) 50 MG tablet TAKE ONE TABLET BY MOUTH EVERY DAY  . tiZANidine (ZANAFLEX) 4 MG tablet Take 0.5 tablets (2 mg total) by mouth every 12 (twelve) hours as needed for muscle spasms.  . White Petrolatum-Mineral Oil (ARTIFICIAL TEARS) ointment Place into both eyes as needed.   No facility-administered encounter medications on file as of 12/18/2019.    PHYSICAL EXAM/ROS: General:cooperative, notin acute distress Cardiovascular: regular rate and rhythm; denies chest pain/discomfort Pulmonary:normal respiratory effort, no SOB, no adventitious lung sounds;normal respiratory effort on room air; oxygensaturation 98% Abdomen: soft, nontender, + bowel soundsin all quadrants GU: no suprapubic tenderness; denies urinary  symptoms Extremities: no edema, no joint deformities Skin: no rasheson visible skin Psych: non -anxious affect Hem/lymph/immune: no widespread bruising Neurological: Weaknessotherwisenonfocal, forgetful, a & o 2 Teodoro Spray, NP

## 2019-12-19 ENCOUNTER — Other Ambulatory Visit: Payer: Self-pay

## 2020-01-11 ENCOUNTER — Other Ambulatory Visit: Payer: Self-pay

## 2020-01-11 NOTE — Patient Outreach (Signed)
°  [facility]) Care Management Chronic Special Needs Program    01/11/2020  Name: Kathryn Lewis, DOB: 1945/05/19  MRN: [id number]   Ms. Kathryn Lewis is enrolled in a chronic special needs plan for Diabetes. [facility] Management will continue to provide services for this member through 01/25/2020. [facility] Care Management Team will assume care 01/26/2020.   Thea Silversmith, RN, MSN, [facility] [phone]

## 2020-01-15 ENCOUNTER — Other Ambulatory Visit: Payer: Self-pay | Admitting: Family Medicine

## 2020-01-29 ENCOUNTER — Other Ambulatory Visit: Payer: Self-pay

## 2020-02-16 ENCOUNTER — Other Ambulatory Visit: Payer: Self-pay | Admitting: Family Medicine

## 2020-02-18 NOTE — Telephone Encounter (Signed)
Refill request for:  Rosuvastatin  40 mg  LR 10/23/19, #90, 0 rf LOV 01/23/19 FOV none scheduled.    Please review and advise.  Thanks. Dm/cma

## 2020-02-29 ENCOUNTER — Other Ambulatory Visit: Payer: Self-pay

## 2020-02-29 ENCOUNTER — Non-Acute Institutional Stay: Payer: HMO | Admitting: Hospice

## 2020-02-29 DIAGNOSIS — Z515 Encounter for palliative care: Secondary | ICD-10-CM

## 2020-02-29 DIAGNOSIS — R269 Unspecified abnormalities of gait and mobility: Secondary | ICD-10-CM | POA: Diagnosis not present

## 2020-02-29 DIAGNOSIS — F0390 Unspecified dementia without behavioral disturbance: Secondary | ICD-10-CM

## 2020-02-29 NOTE — Progress Notes (Signed)
[facility] Consult Note Telephone: [phone]  Fax: [phone]  PATIENT NAME: Kathryn Lewis DOB: 1945-05-30 MRN: [id number]  PRIMARY CARE PROVIDER:   Martinique, Betty G, MD Martinique, Betty G, MD [place],  [place] [address]  REFERRING PROVIDER: Martinique, Betty G, MD Kathryn Lewis, [place],  [place] [address]  RESPONSIBLE PARTY:Kathryn Lewis (daughter) [phone]- Legal Guardian  Visit is to build trust and highlight Palliative Medicine as specialized medical care for people living with serious illness, aimed at facilitating better quality of life through symptoms relief, assisting with advance care plan and establishing goals of care. Called and spoke with [facility] on the visit. She expressed appreciation for the call.   CHIEF COMPLAINT: Follow up palliative visit/gait disturbance  RECOMMENDATIONS/PLAN:   1. Advance Care Planning/Code Status:Code status reviewed. Patient is a Do not Resuscitate  2. Goals of Care: Goals of care include to maximize quality of life and symptom management.  I spent 20 minutes providing this consultation. More than 50% of the time in this consultation was spent on coordinating communication.  -------------------------------------------------------------------------------------------------------------------------------------------------- 3. Symptom management/Plan:  Gait disturbance with recent fall 2 days ago; no injuries. PT/OT is recommended. Patient said she does not want it because it is no use 'my brain is damaged and that is why I fall'. Education provided on need for therapy. Her daughter Kathryn Lewis on board for physical therapy. Facility Director Resident Care to follow up recommendation for implementation. Fall precautions discussed with patient and nursing. No other needs identified at this time. Palliative will continue to monitor for symptom management/decline and  make recommendations as needed. Return 2 months or prn. Encouraged to call provider sooner with any concerns.   Cognitive / Functional decline: Patient is pleasantly confused/forgetful at baseline , Dementia FAST 6c FLACC 0, alert and oriented x 2. Needing supervision to prevent fall due to gait imbalance and recent fall.   Family /Caregiver/Community Supports: Patient lives in [place] for ongoing care. Her daughter Kathryn Lewis is involved in her care.   HISTORY OF PRESENT ILLNESS:  Kathryn Lewis is a 75 y.o. year old female with multiple medical problems including chronic gait disturbance  likely related to worsening Dementia and hx of CVA; she trips easily and tries not to fall; imbalance is worse when she is up trying to walk without assistive device, helped when she uses a rolling walker or holds onto her wheelchair while walking behind it. Hx of HTN,Type 2 DM,HLD, CAD, s/pCVA, gait disorder, STML, COPD, OSA. Palliative Care was asked to help address goals of care and complex decision making. Rest of 10 point ROS asked and negative.  CODE STATUS: DNR  PPS: 50%  HOSPICE ELIGIBILITY/DIAGNOSIS: TBD  PAST MEDICAL HISTORY:  Past Medical History:  Diagnosis Date  . Allergy   . Arthritis   . Bipolar 1 disorder ([place])   . Blood transfusion without reported diagnosis   . Carotid artery stenosis    50-69% bilateral ICA stenoses by velocity criteria but no visible plaque and ICA/CCA ratio 1.72 on right and 1.38 on left  . Coronary artery disease   . Depression   . Diabetes mellitus without complication ([place])   . Echocardiogram    Echo 06/2018: Hyperdynamic systolic function, EF 99991111, mild concentric LVH, elevated LVEDP (E/E' suggests impaired relaxation), mild AI, lipomatous interatrial septum  . Hypertension   . Left adrenal mass ([facility])    2.5 cm L adrenal mass on  CT per note of Dr. Seleta Rhymes 05/20/2014  . MI, old   . PAD (peripheral artery disease) ([place])    03/2014 - CT aortogram with lower  extremity runoff (mild to moderate disease in common iliac arteries, complete occulusion of her bilateral external iliac arteries with heavily disease common femoral arteries. She also has disease to her SFAs bilaterally. Popliteal arteries and tibial vessel runoff appears to be adequate)  . Seizures ([place])   . Stroke [facility])     SOCIAL HX:  Patient lives in [place] for ongoing care. FAMILY HX:  Family History  Problem Relation Age of Onset  . Hypertension Mother   . Arthritis Mother   . Diabetes Mother   . Early death Mother   . Hyperlipidemia Mother   . Stroke Mother   . Hypertension Father   . Alcohol abuse Father   . Early death Father   . Hyperlipidemia Father   . COPD Father   . Heart attack Father   . Hypertension Sister   . Birth defects Brother   . Depression Brother   . Hyperlipidemia Brother   . Hypertension Brother   . Arthritis Daughter   . Asthma Daughter   . COPD Daughter   . Arthritis Son   . COPD Son   . Hypertension Son   . Hyperlipidemia Son   . Heart attack Son   . Arthritis Maternal Grandmother   . Cancer Maternal Grandmother     Labs:   No results for input(s): WBC, HGB, HCT, PLT, MCV in the last 168 hours. No results for input(s): NA, K, CL, CO2, BUN, CREATININE, GLUCOSE in the last 168 hours. Latest GFR by Cockcroft Gault (not valid in AKI or ESRD) CrCl cannot be calculated (Patient's most recent lab result is older than the maximum 21 days allowed.). No results for input(s): AST, ALT, ALKPHOS, GGT in the last 168 hours.  Invalid input(s): TBILI, CONJBILI, ALB, TOTALPROTEIN No components found for: ALB No results for input(s): APTT, INR in the last 168 hours.  Invalid input(s): PTPATIENT No results for input(s): BNP, PROBNP in the last 168 hours.  ALLERGIES:  Allergies  Allergen Reactions  . Influenza Vaccines Anaphylaxis  . Amoxicillin Other (See Comments)    colitis  . Augmentin [Amoxicillin-Pot Clavulanate] Other (See Comments)     colitis  . Cefadroxil Other (See Comments)    Reported by [facility] 2013 - unknown reaction  . Cephalexin Other (See Comments)    colitis  . Erythromycin Other (See Comments)    colitis  . Niacin And Related Other (See Comments)    colitis  . Nitrofurantoin Macrocrystal Other (See Comments)    Reported by [facility] 2013 - unknown reaction      PERTINENT MEDICATIONS:  Outpatient Encounter Medications as of 02/29/2020  Medication Sig  . acetaminophen (TYLENOL) 500 MG tablet Take 500 mg by mouth 4 (four) times daily as needed.  Marland Kitchen albuterol (PROVENTIL HFA;VENTOLIN HFA) 108 (90 Base) MCG/ACT inhaler Inhale 2 puffs into the lungs every 6 (six) hours as needed for wheezing or shortness of breath.  Marland Kitchen amLODipine (NORVASC) 5 MG tablet TAKE ONE TABLET BY MOUTH EVERY DAY  . ASPIRIN LOW DOSE 81 MG EC tablet TAKE ONE TABLET BY MOUTH EVERY DAY  . budesonide-formoterol (SYMBICORT) 160-4.5 MCG/ACT inhaler Inhale 2 puffs into the lungs 2 (two) times daily.  . Cholecalciferol (VITAMIN D) 50 MCG (2000 UT) tablet TAKE ONE TABLET BY MOUTH EVERY DAY  . Cholecalciferol (VITAMIN D3 PO)  Take 8,000 Units by mouth daily.   . clopidogrel (PLAVIX) 75 MG tablet TAKE ONE TABLET BY MOUTH EVERY DAY  . Dextran 70-Hypromellose (NATURAL BALANCE TEARS OP) Apply to eye. 1.4%, 2 drops each eye  . ezetimibe (ZETIA) 10 MG tablet TAKE ONE TABLET BY MOUTH EVERY DAY  . ferrous sulfate 325 (65 FE) MG tablet TAKE ONE TABLET BY MOUTH EVERY TWO DAYS with FOUR OUNCE of orange JUICE  . fluticasone (FLONASE) 50 MCG/ACT nasal spray Place 2 sprays into both nostrils daily.  . furosemide (LASIX) 20 MG tablet Take 1 tablet (20 mg total) by mouth daily as needed for edema.  Marland Kitchen guaiFENesin (MUCINEX) 600 MG 12 hr tablet Take by mouth 2 (two) times daily.  . Insulin Aspart FlexPen 100 UNIT/ML SOPN INJECT subcutaneously THREE TIMES DAILY PER sliding scales BLOOD SUGAR (150 NO INSULIN)+ 151-180 Give FOUR units. 181-220 Give SIX units,  221-260 Give EIGHT units, 261-300 Give 10 units 301-340 Give 12 units AND > 341 Give 14 units  . isosorbide mononitrate (IMDUR) 30 MG 24 hr tablet TAKE ONE TABLET BY MOUTH EVERY DAY  . lamoTRIgine (LAMICTAL) 200 MG tablet Take 1.5 tablets (300 mg total) by mouth See admin instructions. Take 1 1/2 tablets (300 mg) by mouth twice daily - 8am and 2pm  . LANTUS SOLOSTAR 100 UNIT/ML Solostar Pen INJECT 20 units AT BEDTIME  . losartan (COZAAR) 50 MG tablet TAKE ONE TABLET BY MOUTH EVERY DAY  . metoprolol tartrate (LOPRESSOR) 50 MG tablet TAKE ONE TABLET BY MOUTH TWICE DAILY  . Omega-3 Fatty Acids (FISH OIL) 1000 MG CAPS TAKE TWO CAPSULES BY MOUTH EVERY DAY  . phenytoin (DILANTIN) 100 MG ER capsule Take 3 capsules (300 mg total) by mouth daily.  . phenytoin (DILANTIN) 30 MG ER capsule Take 1 capsule (30 mg total) by mouth at bedtime.  . rosuvastatin (CRESTOR) 40 MG tablet TAKE ONE TABLET BY MOUTH EVERY DAY  . Spacer/Aero Chamber Mouthpiece MISC 1 Device by Does not apply route daily as needed.  Marland Kitchen SPIRIVA HANDIHALER 18 MCG inhalation capsule place ONE capsule into inhaler AND inhale EVERY DAY  . spironolactone (ALDACTONE) 50 MG tablet TAKE ONE TABLET BY MOUTH EVERY DAY  . tiZANidine (ZANAFLEX) 4 MG tablet Take 0.5 tablets (2 mg total) by mouth every 12 (twelve) hours as needed for muscle spasms.  . White Petrolatum-Mineral Oil (ARTIFICIAL TEARS) ointment Place into both eyes as needed.   No facility-administered encounter medications on file as of 02/29/2020.    ROS  General: NAD EYES: denies vision changes ENMT: denies dysphagia no xerostomia Cardiovascular: denies chest pain Pulmonary: denies  cough, denies SOB,  Abdomen: endorses fair appetite, no constipation,or diarrhea GU: denies dysuria or urinary frquency MSK:  gait imbalance, fall 2 days ago Skin: denies rashes or wounds Neurological: endorses weakness, denies pain, denies insomnia Psych: Endorses positive mood Heme/lymph/immuno: denies  bruises, abnormal bleeding   PHYSICAL EXAM  Constitutional: In no acute distress,  Cardiovascular: regular rate and rhythm Pulmonary: no cough, no increased work of breathing, normal respiratory effort Abdomen: soft, non tender, positive bowel sounds in all quadrants GU:  no suprapubic tenderness Eyes: Normal lids, no discharge, sclera anicteric ENMT: Moist mucous membranes Musculoskeletal: Ambulatory with assistive device, gait imbalance, no edema in BLE Skin: no rash to visible skin, warm without cyanosis Psych: non-anxious affect Neurological: Weakness but otherwise non focal Heme/lymph/immuno: no bruises, no bleeding  Palliative Care was asked to follow this patient by consultation request of [place], Malka So, MD  to help address advance care planning and complex decision making. Thank you for the opportunity to participate in the care of [place] Please call our office at [phone] if we can be of additional assistance.  Note: Portions of this note were generated with [employer]. Dictation errors may occur despite best attempts at proofreading.  Teodoro Spray, NP

## 2020-03-04 ENCOUNTER — Other Ambulatory Visit: Payer: Self-pay | Admitting: Family Medicine

## 2020-03-05 ENCOUNTER — Other Ambulatory Visit: Payer: Self-pay | Admitting: Family Medicine

## 2020-03-05 DIAGNOSIS — I10 Essential (primary) hypertension: Secondary | ICD-10-CM

## 2020-03-10 ENCOUNTER — Other Ambulatory Visit: Payer: Self-pay | Admitting: Family Medicine

## 2020-03-10 DIAGNOSIS — I2510 Atherosclerotic heart disease of native coronary artery without angina pectoris: Secondary | ICD-10-CM

## 2020-03-10 DIAGNOSIS — I2583 Coronary atherosclerosis due to lipid rich plaque: Secondary | ICD-10-CM

## 2020-03-11 ENCOUNTER — Other Ambulatory Visit: Payer: Self-pay

## 2020-03-11 IMAGING — DX DG CHEST 1V PORT
1 series · 1 of 1 positions shown · non-contrast
Comparison: 12/11/2015.

CLINICAL DATA: Hypertension.

EXAM:
PORTABLE CHEST 1 VIEW

[chest]
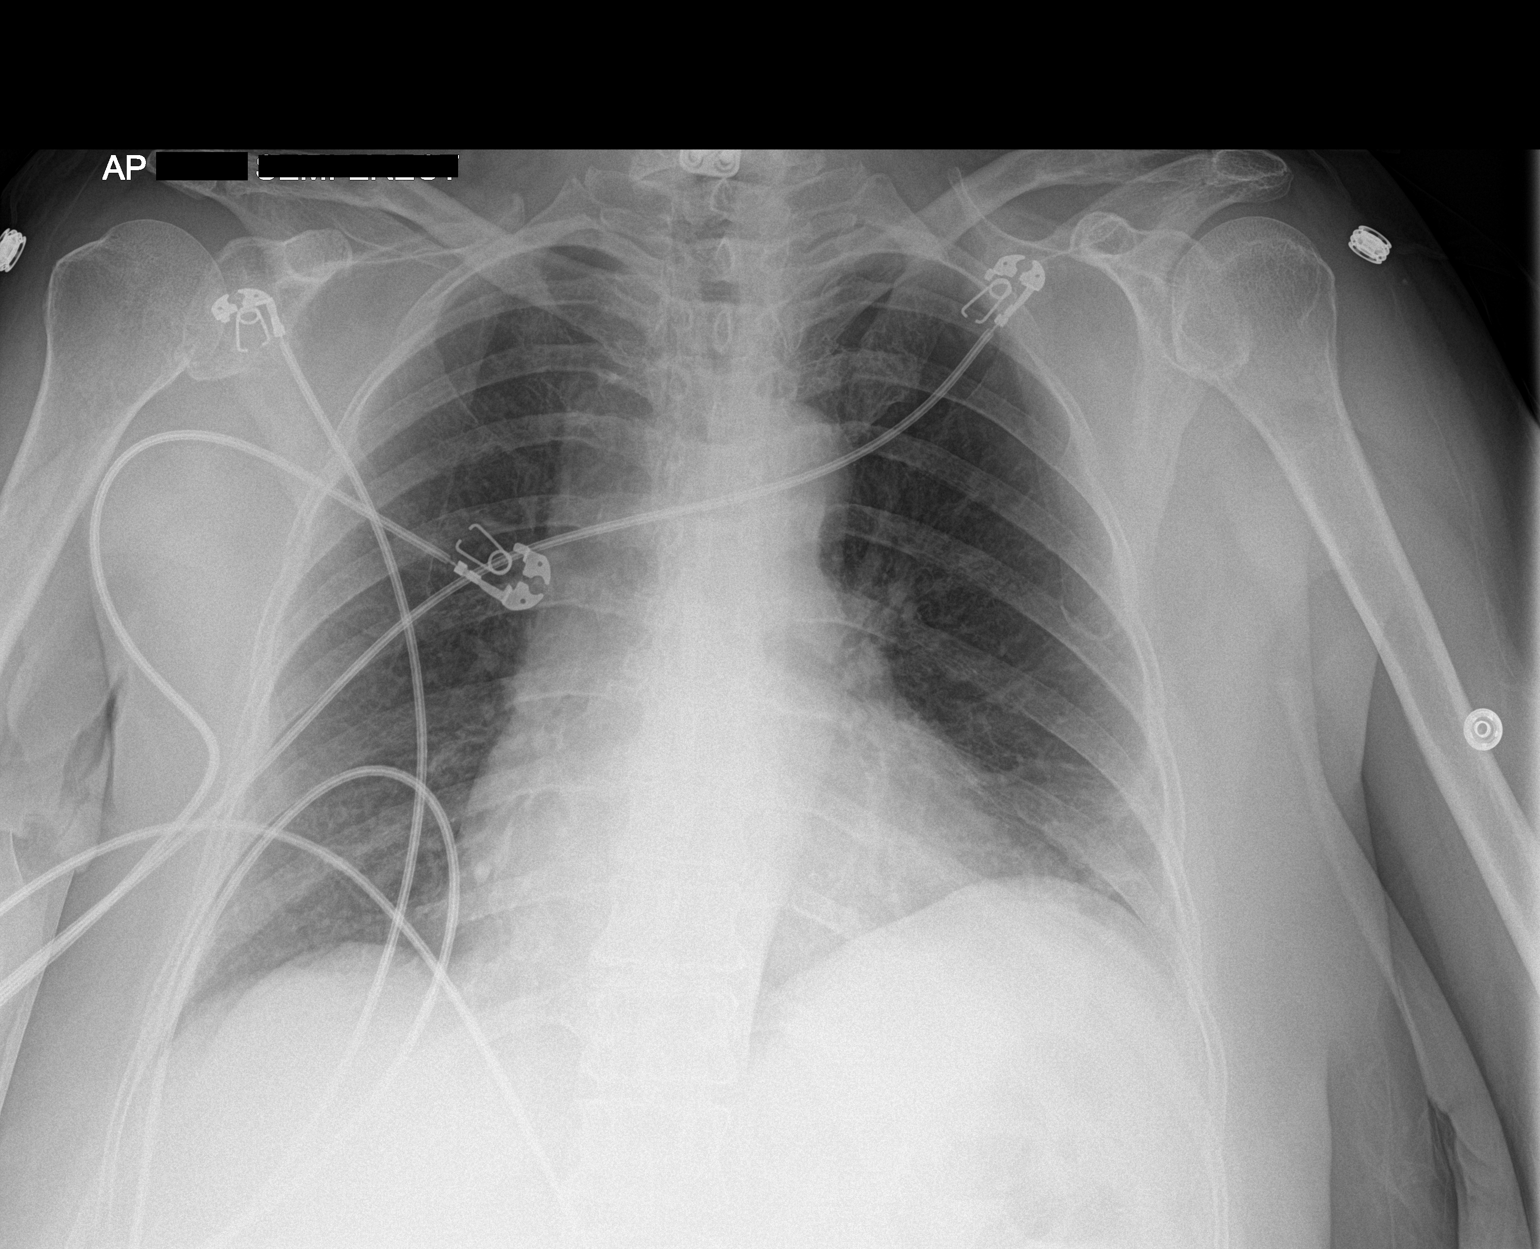

[1 of 1 positions shown; findings below may reference images not displayed]

FINDINGS: Stable borderline enlarged cardiac silhouette. Interval mild
elevation of the left hemidiaphragm and minimal left basilar patchy
density. Stable mild prominence of the interstitial markings with
normal vascularity. No pleural fluid. Cervical spine fixation
hardware.
IMPRESSION: 1. Interval mild elevation of the left hemidiaphragm with minimal
left basilar atelectasis or scarring.
2. Stable borderline cardiomegaly and mild chronic interstitial lung
disease.

## 2020-03-12 ENCOUNTER — Ambulatory Visit (INDEPENDENT_AMBULATORY_CARE_PROVIDER_SITE_OTHER): Payer: HMO | Admitting: Family Medicine

## 2020-03-12 ENCOUNTER — Ambulatory Visit (INDEPENDENT_AMBULATORY_CARE_PROVIDER_SITE_OTHER)
Admission: RE | Admit: 2020-03-12 | Discharge: 2020-03-12 | Disposition: A | Discharge status: Home / Self Care | Payer: HMO | Source: Ambulatory Visit | Attending: Family Medicine | Admitting: Family Medicine

## 2020-03-12 ENCOUNTER — Other Ambulatory Visit: Payer: Self-pay | Admitting: Family Medicine

## 2020-03-12 ENCOUNTER — Encounter: Payer: Self-pay | Admitting: Family Medicine

## 2020-03-12 VITALS — BP 128/80 | HR 71 | Resp 16 | Ht 61.0 in

## 2020-03-12 DIAGNOSIS — R2681 Unsteadiness on feet: Secondary | ICD-10-CM

## 2020-03-12 DIAGNOSIS — G44319 Acute post-traumatic headache, not intractable: Secondary | ICD-10-CM | POA: Diagnosis not present

## 2020-03-12 DIAGNOSIS — I739 Peripheral vascular disease, unspecified: Secondary | ICD-10-CM

## 2020-03-12 DIAGNOSIS — J449 Chronic obstructive pulmonary disease, unspecified: Secondary | ICD-10-CM | POA: Diagnosis not present

## 2020-03-12 DIAGNOSIS — I119 Hypertensive heart disease without heart failure: Secondary | ICD-10-CM

## 2020-03-12 DIAGNOSIS — I25118 Atherosclerotic heart disease of native coronary artery with other forms of angina pectoris: Secondary | ICD-10-CM

## 2020-03-12 DIAGNOSIS — G40209 Localization-related (focal) (partial) symptomatic epilepsy and epileptic syndromes with complex partial seizures, not intractable, without status epilepticus: Secondary | ICD-10-CM | POA: Diagnosis not present

## 2020-03-12 DIAGNOSIS — G629 Polyneuropathy, unspecified: Secondary | ICD-10-CM | POA: Diagnosis not present

## 2020-03-12 DIAGNOSIS — E1149 Type 2 diabetes mellitus with other diabetic neurological complication: Secondary | ICD-10-CM

## 2020-03-12 DIAGNOSIS — F0390 Unspecified dementia without behavioral disturbance: Secondary | ICD-10-CM | POA: Diagnosis not present

## 2020-03-12 DIAGNOSIS — R42 Dizziness and giddiness: Secondary | ICD-10-CM | POA: Diagnosis not present

## 2020-03-12 DIAGNOSIS — R296 Repeated falls: Secondary | ICD-10-CM | POA: Diagnosis not present

## 2020-03-12 NOTE — Assessment & Plan Note (Signed)
BP adequately controlled.  Continue losartan 50 mg daily, metoprolol tartrate 50 mg twice daily, and spironolactone 50 mg daily. Continue low-salt diet. Continue monitoring BP regularly.

## 2020-03-12 NOTE — Assessment & Plan Note (Signed)
She has been asymptomatic. Due to cost she is not using Symbicort, so discontinued. Continue Spiriva.

## 2020-03-12 NOTE — Assessment & Plan Note (Signed)
HgA1C has been at goal. Because she had to leave for head CT, we could not have labs today, her daughter will bring her back. Regular exercise and healthy diet with avoidance of added sugar food intake encouraged. Annual eye exam, periodic dental and foot care recommended. F/U in 5-6 months

## 2020-03-12 NOTE — Assessment & Plan Note (Signed)
We reviewed possible etiologies, which include some of her chronic medical problems as well as medications. Fall precautions discussed.

## 2020-03-12 NOTE — Progress Notes (Signed)
Chief Complaint  Patient presents with  . Fall   HPI: Kathryn Lewis is a 75 y.o. female with hx of HTN.CAD,PAD,dementia,seizure disorder,and bipolar disorder here today with her daughter complaining of "bad" headache and dizziness after hitting her head on the floor 3 to 4 days ago. She lives in assisted living, [facility]. She has frequent falls due to unstable gait. This year she had had 2-3 falls so far. I do not have documentation from facility and daughter doe snot have a lot of information, and pt doe snot remember details of event.  She had a fall 3 to 4 days ago, she does not recall mechanism. After the fall she reporting severe headache, no known history of headaches.  She does not remember the last time she had a headache but her son was with her yesterday and she was complaining of headache. Not sure about MS changes. She states that she felt like falling on the left side, like "waves", intermittently. No associated focal weakness. Denies visual changes,CP,palpitations,or SOB.  Neck and upper back pain aggravated by fall, these have improved. She has pain intermittently, chronic.  According to patient, she had low BS's, felt better after drinking juice. She is currently on Lantus 20 units daily and insulin aspart by sliding scale. DM II Dx'ed in 2008. Lab Results  Component Value Date   HGBA1C 5.4 01/23/2019  Negative for polydipsia,polyuria, or polyphagia.  HTN: She is on Spironolactone 50 mg daily, Amlodipine 5 mg daily, Metoprolol Tartrate 50 mg bid,Imdur 30 mg daily, and Losartan 50 mg daily. She is also on Furosemide 20 mg daily prn. LE edema has been well controlled.  Lab Results  Component Value Date   CREATININE 0.87 03/14/2019   BUN 17 03/14/2019   NA 137 03/14/2019   K 4.2 03/14/2019   CL 103 03/14/2019   CO2 24 03/14/2019   PAD/CAD: She is on Plavix 75 mg daily,Aspirin 81 mg daily,Zetia 10 mg daily,and Crestor 40 mg daily.  Hx of CAD, 3  vessel disease and due to age and surgical risk, conservative management was recommended. 2015 CVD with residual hemianopsia. She is not longer following with cardiologist.  Lab Results  Component Value Date   CHOL 183 09/09/2017   HDL 66 09/09/2017   LDLCALC 85 09/09/2017   TRIG 160 (H) 09/09/2017   CHOLHDL 2.8 09/09/2017   She is not having respiratory symptoms. Has not been on Symbicort because of cost.  Seizure disorder, follows with Dr Delice Lesch annually. She is on Dilantin 330 mg daily.  Review of Systems  Constitutional: Positive for fatigue. Negative for activity change, appetite change and fever.  HENT: Negative for mouth sores, nosebleeds and sore throat.   Eyes: Negative for redness and visual disturbance.  Respiratory: Negative for cough and wheezing.   Gastrointestinal: Negative for abdominal pain, nausea and vomiting.       Negative for changes in bowel habits.  Genitourinary: Negative for decreased urine volume, dysuria and hematuria.  Musculoskeletal: Positive for arthralgias, gait problem and myalgias.  Neurological: Negative for syncope, facial asymmetry and weakness.  Psychiatric/Behavioral: Negative for agitation and hallucinations. The patient is nervous/anxious.   Rest see pertinent positives and negatives per HPI.  Current Outpatient Medications on File Prior to Visit  Medication Sig Dispense Refill  . acetaminophen (TYLENOL) 500 MG tablet Take 500 mg by mouth 4 (four) times daily as needed.    Marland Kitchen albuterol (PROVENTIL HFA;VENTOLIN HFA) 108 (90 Base) MCG/ACT inhaler Inhale 2 puffs  into the lungs every 6 (six) hours as needed for wheezing or shortness of breath. 1 Inhaler 6  . amLODipine (NORVASC) 5 MG tablet TAKE ONE TABLET BY MOUTH EVERY DAY 30 tablet 5  . ASPIRIN LOW DOSE 81 MG EC tablet TAKE ONE TABLET BY MOUTH EVERY DAY 30 tablet 2  . Cholecalciferol (VITAMIN D3 PO) Take 8,000 Units by mouth daily.     . Cholecalciferol (VITAMIN D3) 50 MCG (2000 UT) TABS  TAKE ONE TABLET BY MOUTH EVERY DAY 30 tablet 5  . clopidogrel (PLAVIX) 75 MG tablet TAKE ONE TABLET BY MOUTH EVERY DAY 30 tablet 5  . Dextran 70-Hypromellose (NATURAL BALANCE TEARS OP) Apply to eye. 1.4%, 2 drops each eye    . ezetimibe (ZETIA) 10 MG tablet TAKE ONE TABLET BY MOUTH EVERY DAY 30 tablet 5  . ferrous sulfate 325 (65 FE) MG tablet TAKE ONE TABLET BY MOUTH EVERY TWO DAYS with FOUR OUNCE of orange JUICE 100 tablet 0  . fluticasone (FLONASE) 50 MCG/ACT nasal spray Place 2 sprays into both nostrils daily. 16 g 6  . furosemide (LASIX) 20 MG tablet Take 1 tablet (20 mg total) by mouth daily as needed for edema. 30 tablet 3  . guaiFENesin (MUCINEX) 600 MG 12 hr tablet Take by mouth 2 (two) times daily.    . Insulin Aspart FlexPen 100 UNIT/ML SOPN INJECT subcutaneously THREE TIMES DAILY PER sliding scales BLOOD SUGAR (150 NO INSULIN)+ 151-180 Give FOUR units. 181-220 Give SIX units, 221-260 Give EIGHT units, 261-300 Give 10 units 301-340 Give 12 units AND > 341 Give 14 units 15 mL 1  . lamoTRIgine (LAMICTAL) 200 MG tablet Take 1.5 tablets (300 mg total) by mouth See admin instructions. Take 1 1/2 tablets (300 mg) by mouth twice daily - 8am and 2pm 270 tablet 3  . LANTUS SOLOSTAR 100 UNIT/ML Solostar Pen INJECT 20 units AT BEDTIME 15 mL 1  . metoprolol tartrate (LOPRESSOR) 50 MG tablet TAKE ONE TABLET BY MOUTH TWICE DAILY 60 tablet 5  . Omega-3 Fatty Acids (FISH OIL) 1000 MG CAPS TAKE TWO CAPSULES BY MOUTH EVERY DAY 60 capsule 5  . phenytoin (DILANTIN) 100 MG ER capsule Take 3 capsules (300 mg total) by mouth daily. 270 capsule 3  . phenytoin (DILANTIN) 30 MG ER capsule Take 1 capsule (30 mg total) by mouth at bedtime. 90 capsule 3  . rosuvastatin (CRESTOR) 40 MG tablet TAKE ONE TABLET BY MOUTH EVERY DAY 30 tablet 5  . Spacer/Aero Chamber Mouthpiece MISC 1 Device by Does not apply route daily as needed. 1 each 0  . SPIRIVA HANDIHALER 18 MCG inhalation capsule place ONE capsule into inhaler AND  inhale EVERY DAY 30 capsule 5  . spironolactone (ALDACTONE) 50 MG tablet TAKE ONE TABLET BY MOUTH EVERY DAY 30 tablet 5  . tiZANidine (ZANAFLEX) 4 MG tablet Take 0.5 tablets (2 mg total) by mouth every 12 (twelve) hours as needed for muscle spasms. 30 tablet 2  . White Petrolatum-Mineral Oil (ARTIFICIAL TEARS) ointment Place into both eyes as needed.     No current facility-administered medications on file prior to visit.   Past Medical History:  Diagnosis Date  . Allergy   . Arthritis   . Bipolar 1 disorder ([place])   . Blood transfusion without reported diagnosis   . Carotid artery stenosis    50-69% bilateral ICA stenoses by velocity criteria but no visible plaque and ICA/CCA ratio 1.72 on right and 1.38 on left  . Coronary artery disease   .  Depression   . Diabetes mellitus without complication ([place])   . Echocardiogram    Echo 06/2018: Hyperdynamic systolic function, EF >78, mild concentric LVH, elevated LVEDP (E/E' suggests impaired relaxation), mild AI, lipomatous interatrial septum  . Hypertension   . Left adrenal mass (HCC)    2.5 cm L adrenal mass on CT per note of Dr. Seleta Rhymes 05/20/2014  . MI, old   . PAD (peripheral artery disease) ([place])    03/2014 - CT aortogram with lower extremity runoff (mild to moderate disease in common iliac arteries, complete occulusion of her bilateral external iliac arteries with heavily disease common femoral arteries. She also has disease to her SFAs bilaterally. Popliteal arteries and tibial vessel runoff appears to be adequate)  . Seizures ([place])   . Stroke [facility])    Allergies  Allergen Reactions  . Influenza Vaccines Anaphylaxis  . Amoxicillin Other (See Comments)    colitis  . Augmentin [Amoxicillin-Pot Clavulanate] Other (See Comments)    colitis  . Cefadroxil Other (See Comments)    Reported by [facility] 2013 - unknown reaction  . Cephalexin Other (See Comments)    colitis  . Erythromycin Other (See Comments)    colitis  . Niacin And  Related Other (See Comments)    colitis  . Nitrofurantoin Macrocrystal Other (See Comments)    Reported by [facility] 2013 - unknown reaction    Social History   Socioeconomic History  . Marital status: Single    Spouse name: Not on file  . Number of children: 2  . Years of education: Not on file  . Highest education level: Not on file  Occupational History  . Not on file  Tobacco Use  . Smoking status: Former Smoker    Packs/day: 1.00    Types: Cigarettes    Quit date: 2015    Years since quitting: 7.1  . Smokeless tobacco: Never Used  Vaping Use  . Vaping Use: Never used  Substance and Sexual Activity  . Alcohol use: No  . Drug use: No  . Sexual activity: Not on file  Other Topics Concern  . 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 Difficulty of Paying Living Expenses: Not hard at all  Food Insecurity: No Food Insecurity  . Worried About Charity fundraiser in the Last Year: Never true  . Ran Out of Food in the Last Year: Never true  Transportation Needs: No Transportation Needs  . Lack of Transportation (Medical): No  . Lack of Transportation (Non-Medical): No  Physical Activity: Inactive  . Days of Exercise per Week: 0 days  . Minutes of Exercise per Session: 0 min  Stress: No Stress Concern Present  . Feeling of Stress : Not at all  Social Connections: Socially Isolated  . Frequency of Communication with Friends and Family: More than three times a week  . Frequency of Social Gatherings with Friends and Family: More than three times a week  . Attends Religious Services: Never  . Active Member of Clubs or Organizations: No  . Attends Archivist Meetings: Never  . Marital Status: Widowed   Vitals:    03/12/20 1424  BP: 128/80  Pulse: 71  Resp: 16  SpO2: 96%   Body mass index is 24.68 kg/m.  Physical Exam Vitals and nursing note reviewed.  Constitutional:      General: She is not in acute distress.    Appearance: She is well-developed.  HENT:     Head: Normocephalic and atraumatic.     Comments: I do not appreciate scalp abnormalities, no hematomas,signs of trauma,and no tenderness.    Mouth/Throat:     Mouth: Oropharynx is clear and moist. Mucous membranes are dry.  Eyes:     Conjunctiva/sclera: Conjunctivae normal.  Cardiovascular:     Rate and Rhythm: Normal rate and regular rhythm.     Heart sounds: No murmur heard.     Comments: PT pulse present, bilateral. Pulmonary:     Effort: Pulmonary effort is normal. No respiratory distress.     Breath sounds: Normal breath sounds.  Abdominal:     Palpations: Abdomen is soft. There is no hepatomegaly or mass.     Tenderness: There is no abdominal tenderness.  Musculoskeletal:        General: No swelling, deformity or edema.     Cervical back: No tenderness or bony tenderness.     Thoracic back: No tenderness or bony tenderness.     Right lower leg: No edema.     Left lower leg: No edema.  Lymphadenopathy:     Cervical: No cervical adenopathy.  Skin:    General: Skin is warm.     Findings: Abrasion present. No erythema or rash.       Neurological:     Mental Status: She is alert. She is disoriented.     Cranial Nerves: No cranial nerve deficit.     Deep Tendon Reflexes: Strength normal.     Comments: She is not ambulatory, in a wheel chair.  Psychiatric:        Mood and Affect: Mood and affect normal. Mood is not anxious or depressed.        Behavior: Behavior is cooperative.        Cognition and Memory: Cognition is impaired. Memory is impaired.     Comments: Well groomed, good eye contact. She seems to be at her baseline.   ASSESSMENT AND PLAN:  Kathryn Lewis was seen today for fall.  Diagnoses and all orders  for this visit: Orders Placed This Encounter  Procedures  . DME Wheelchair manual  . CT Head Wo Contrast  . Fructosamine  . Microalbumin / creatinine urine ratio  . Hemoglobin A1c  . Comprehensive metabolic panel   Acute post-traumatic headache, not intractable Today she seems to be at her baseline. Because hx and her daughter's concern, head CT ordered and scheduled for today. Continue monitoring for MS changes and instructed about warning signs.  Head CT report reviewed and negative for acute abnormalities.  Frequent falls We discussed fall precautions, challenging because she usually do not call for help if she needs something, forgets; and tries to pick up things from floor. Chronic medical problems and medications also increase risk for falls. She has done PT. Adequate hydration. She states that she does "know how to fall", so she does not get seriously injured.  Diabetes mellitus type 2 with neurological manifestations ([place]) HgA1C has been at goal. Because appt for head CT she did not have time for labs today, will come back. No changes in Lantus and insulin aspart dose. I do not have BS log today. Annual eye exam and foot care recommended. F/U  in 5-6 months  Dementia without behavioral disturbance, unspecified dementia type ([place]) Problem seems to be stable. Following with neurologist.  Hypertension with heart disease BP adequately controlled. No changes in dose of Metoprolol tartrate,Imdur,Losartan,or Spironolactone. Monitor BP regularly. Low salt diet to continue.  Peripheral polyneuropathy Appropriate foot care discussed.  PAD (peripheral artery disease) (HCC) Continue Crestor,Zetia,Plavix,and Aspirin at same dose. We discussed soe side effects of antiplatelet therapy.  Chronic obstructive pulmonary disease, unspecified COPD type ([place]) It seems well controlled. Continue with just Albuterol inh q 4-6 hours as needed.  Localization-related symptomatic  epilepsy and epileptic syndromes with complex partial seizures, not intractable, without status epilepticus ([place]) I wonder if Dilantin dose could be decreased. Following with Dr Delice Lesch.  Atherosclerotic heart disease of native coronary artery with other forms of angina pectoris (HCC) Continue Crestor 40 mg,Zetia 10 mg,Aspirin 81 mg,and BB.  Spent 46 minutes with pt.  During this time history was obtained and documented, examination was performed, prior labs reviewed, and assessment/plan discussed.  Return in about 4 months (around 07/10/2020) for Needs lab appt..   Darcelle Herrada G. Martinique, MD  [facility]. [place] office.

## 2020-03-12 NOTE — Assessment & Plan Note (Addendum)
Appropriate foot care discussed. Adequate glucose control. She also follows with neurologist.

## 2020-03-12 NOTE — Assessment & Plan Note (Signed)
Continue Crestor 40 mg daily and Plavix 75 mg daily. We discussed some side effects of medications. She is not interested in having more studies. Adequate foot care discussed.

## 2020-03-14 ENCOUNTER — Other Ambulatory Visit: Payer: Self-pay

## 2020-03-14 MED ORDER — AMLODIPINE BESYLATE 5 MG PO TABS: 5.0000 mg | ORAL_TABLET | Freq: Every day | ORAL | 5 refills | Status: DC

## 2020-03-14 NOTE — Progress Notes (Signed)
Patient has a mobility limitation that impairs their ability to do one or more MRADLs in customary locations in the home and patient cannot use a can, crutch, or walker to resolve the issue sufficiently; patient can safely self-propel the wheelchair in the home or has caregivers that can provide assistance.

## 2020-03-21 NOTE — Progress Notes (Signed)
Patient is scheduled for labs on 07/14/2020 and follow up appointment with Dr. Martinique on 07/21/2020

## 2020-03-24 ENCOUNTER — Other Ambulatory Visit: Payer: Self-pay | Admitting: Family Medicine

## 2020-03-24 ENCOUNTER — Telehealth: Payer: Self-pay

## 2020-03-24 ENCOUNTER — Other Ambulatory Visit: Payer: Self-pay | Admitting: Neurology

## 2020-03-24 DIAGNOSIS — M1990 Unspecified osteoarthritis, unspecified site: Secondary | ICD-10-CM | POA: Diagnosis not present

## 2020-03-24 DIAGNOSIS — R296 Repeated falls: Secondary | ICD-10-CM | POA: Diagnosis not present

## 2020-03-24 NOTE — Telephone Encounter (Signed)
Received phone call from [place] inquiring who would pay for DME. Noted that patient is currently under Palliative care and DME is billed through personal insurance.

## 2020-03-25 ENCOUNTER — Other Ambulatory Visit: Payer: Self-pay | Admitting: Neurology

## 2020-03-31 ENCOUNTER — Ambulatory Visit: Payer: HMO | Admitting: Neurology

## 2020-04-03 ENCOUNTER — Other Ambulatory Visit: Payer: Self-pay | Admitting: Neurology

## 2020-04-07 ENCOUNTER — Telehealth: Payer: Self-pay | Admitting: Neurology

## 2020-04-07 NOTE — Telephone Encounter (Signed)
She is probably wondering if she is having seizures in her sleep. Can you pls confirm she is not missing any seizure medications? We got a notification in July that she had missed several doses of Dilantin, is she taking her own meds or does staff give to her? Thanks!

## 2020-04-07 NOTE — Telephone Encounter (Signed)
Patient called in and left a message. She has been biting her tongue in her sleep and would like some advice.

## 2020-04-08 NOTE — Telephone Encounter (Signed)
Called patient and left a message for a call back. Need to ask question given by Dr. Delice Lesch.

## 2020-04-09 NOTE — Telephone Encounter (Signed)
Spoke to [place] who has POA of patient. She informed me that she is unsure if patient has been taking her Dilantin because she lives in an assisted living facility.[name] also expressed that she isn't even sure if the staff has been giving it to the patient and stated that they are short staffed. [name] informed me that she will contact the facility and ask, but she is worried they might not be honest if patient is being given her medication or not. [name] will be giving [place] a call back once she speaks to a nurse at patients living facility.

## 2020-04-14 ENCOUNTER — Other Ambulatory Visit: Payer: Self-pay | Admitting: Family Medicine

## 2020-04-16 ENCOUNTER — Other Ambulatory Visit: Payer: Self-pay | Admitting: Neurology

## 2020-04-21 DIAGNOSIS — M1990 Unspecified osteoarthritis, unspecified site: Secondary | ICD-10-CM | POA: Diagnosis not present

## 2020-04-21 DIAGNOSIS — R296 Repeated falls: Secondary | ICD-10-CM | POA: Diagnosis not present

## 2020-04-22 ENCOUNTER — Other Ambulatory Visit: Payer: Self-pay | Admitting: Family Medicine

## 2020-04-25 ENCOUNTER — Other Ambulatory Visit: Payer: Self-pay | Admitting: Family Medicine

## 2020-04-29 ENCOUNTER — Ambulatory Visit: Payer: Self-pay

## 2020-04-29 ENCOUNTER — Non-Acute Institutional Stay: Payer: HMO | Admitting: Hospice

## 2020-04-29 ENCOUNTER — Other Ambulatory Visit: Payer: Self-pay

## 2020-04-29 DIAGNOSIS — Z515 Encounter for palliative care: Secondary | ICD-10-CM

## 2020-04-29 DIAGNOSIS — R269 Unspecified abnormalities of gait and mobility: Secondary | ICD-10-CM | POA: Diagnosis not present

## 2020-04-29 NOTE — Progress Notes (Signed)
[facility] Consult Note Telephone: [phone]  Fax: [phone]  PATIENT NAME: Kathryn Lewis DOB: 03/06/1945 MRN: [id number]  PRIMARY CARE PROVIDER:   Martinique, Betty G, MD Martinique, Betty G, MD [place],  [place] [address]  REFERRING PROVIDER: Martinique, Betty G, MD Kathryn Lewis, [place],  [place] [address]   RESPONSIBLE PARTY:Kathryn Lewis (daughter) [phone]- Legal Guardian    Visit is to build trust and highlight Palliative Medicine as specialized medical care for people living with serious illness, aimed at facilitating better quality of life through symptoms relief, assisting with advance care plan and establishing goals of care.   CHIEF COMPLAINT: Palliative follow up visit/Gait disturbance  RECOMMENDATIONS/PLAN:   1. Advance Care Planning/Code Status: Patient is not Do Not Resuscitate  2. Goals of Care: Goals of care include to maximize quality of life and symptom management.  Visit consisted of counseling and education dealing with the complex and emotionally intense issues of symptom management and palliative care in the setting of serious and potentially life-threatening illness.  Patient said that she has a positive attitude towards life and that even when she has challenges, she fakes to be happy until she actually becomes happy.  Therapeutic listening provided.  Palliative care team will continue to support patient, patient's family, and medical team.  3. Symptom management/Plan:  Gait disturbance has improved. No fall since last visit and no hospitalization. Continue activity as tolerated, use of assistive device for support. Fall precautions.  Type 2 DM well managed insulin. Continue ongoing care, reduced concentrated sweet.  No recent COPD exacerbation.  Breathing treatments on hand as needed for respiratory difficulty. Palliative will continue to monitor for symptom  management/decline and make recommendations as needed. Return 2 months or prn. Encouraged to call provider sooner with any concerns.   HISTORY OF PRESENT ILLNESS: Follow up visit for Kathryn Lewis, a 75 y.o. female with multiple medical problems including gait disturbance resulting in 2 falls in February 2022, impairing her independence. Falls precautions and assistance as needed has helped patient; no fall since last fall in February '22. Patient denies pain/discomfort, no dizziness or headaches. History of Hx of HTN,Type 2 DM,HLD, CAD, s/pCVA, gait disorder, STML, COPD, OSA. History obtained from review of EMR, discussion with primary team, and  interview with family, caregiver  and/or patient. Records reviewed and summarized above. All 10 point systems reviewed and are negative except as documented in history of present illness above  Review and summarization of Epic records shows history from other than patient.   Palliative Care was asked to follow this patient by consultation request of Martinique, Malka So, MD to help address complex decision making in the context of advance care planning and goals of care clarification.   CODE STATUS: DNR  PPS: 50%  HOSPICE ELIGIBILITY/DIAGNOSIS: TBD  PAST MEDICAL HISTORY:  Past Medical History:  Diagnosis Date  . Allergy   . Arthritis   . Bipolar 1 disorder ([place])   . Blood transfusion without reported diagnosis   . Carotid artery stenosis    50-69% bilateral ICA stenoses by velocity criteria but no visible plaque and ICA/CCA ratio 1.72 on right and 1.38 on left  . Coronary artery disease   . Depression   . Diabetes mellitus without complication ([place])   . Echocardiogram    Echo 06/2018: Hyperdynamic systolic function, EF >92, mild concentric LVH, elevated LVEDP (E/E' suggests impaired relaxation), mild AI, lipomatous interatrial  septum  . Hypertension   . Left adrenal mass (HCC)    2.5 cm L adrenal mass on CT per note of Dr. Seleta Rhymes 05/20/2014  .  MI, old   . PAD (peripheral artery disease) ([place])    03/2014 - CT aortogram with lower extremity runoff (mild to moderate disease in common iliac arteries, complete occulusion of her bilateral external iliac arteries with heavily disease common femoral arteries. She also has disease to her SFAs bilaterally. Popliteal arteries and tibial vessel runoff appears to be adequate)  . Seizures ([place])   . Stroke [facility])      SOCIAL HX: @SOCX  Patient lives at [facility]  for ongoing care   FAMILY HX:  Family History  Problem Relation Age of Onset  . Hypertension Mother   . Arthritis Mother   . Diabetes Mother   . Early death Mother   . Hyperlipidemia Mother   . Stroke Mother   . Hypertension Father   . Alcohol abuse Father   . Early death Father   . Hyperlipidemia Father   . COPD Father   . Heart attack Father   . Hypertension Sister   . Birth defects Brother   . Depression Brother   . Hyperlipidemia Brother   . Hypertension Brother   . Arthritis Daughter   . Asthma Daughter   . COPD Daughter   . Arthritis Son   . COPD Son   . Hypertension Son   . Hyperlipidemia Son   . Heart attack Son   . Arthritis Maternal Grandmother   . Cancer Maternal Grandmother     Review lab tests/diagnostics Results for VEARL, AITKEN (MRN [id number]) as of 04/29/2020 11:06  Ref. Range 03/14/2019 18:52  Sodium Latest Ref Range: 135 - 145 mmol/L 137  Potassium Latest Ref Range: 3.5 - 5.1 mmol/L 4.2  Chloride Latest Ref Range: 98 - 111 mmol/L 103  CO2 Latest Ref Range: 22 - 32 mmol/L 24  Glucose Latest Ref Range: 70 - 99 mg/dL 144 (H)  BUN Latest Ref Range: 8 - 23 mg/dL 17  Creatinine Latest Ref Range: 0.44 - 1.00 mg/dL 0.87  Calcium Latest Ref Range: 8.9 - 10.3 mg/dL 9.3  Anion gap Latest Ref Range: 5 - 15  10  GFR, Est Non African American Latest Ref Range: >60 mL/min >60  GFR, Est African American Latest Ref Range: >60 mL/min >60  WBC Latest Ref Range: 4.0 - 10.5 K/uL 4.6  RBC Latest Ref Range: 3.87 -  5.11 MIL/uL 3.25 (L)  Hemoglobin Latest Ref Range: 12.0 - 15.0 Lewis/dL 9.5 (L)  HCT Latest Ref Range: 36.0 - 46.0 % 29.5 (L)  MCV Latest Ref Range: 80.0 - 100.0 fL 90.8  MCH Latest Ref Range: 26.0 - 34.0 pg 29.2  MCHC Latest Ref Range: 30.0 - 36.0 Lewis/dL 32.2  Latest GFR by Cockcroft Gault (not valid in AKI or ESRD) CrCl cannot be calculated (Patient's most recent lab result is older than the maximum 21 days allowed.).  ALLERGIES:  Allergies  Allergen Reactions  . Influenza Vaccines Anaphylaxis  . Amoxicillin Other (See Comments)    colitis  . Augmentin [Amoxicillin-Pot Clavulanate] Other (See Comments)    colitis  . Cefadroxil Other (See Comments)    Reported by [facility] 2013 - unknown reaction  . Cephalexin Other (See Comments)    colitis  . Erythromycin Other (See Comments)    colitis  . Niacin And Related Other (See Comments)    colitis  . Nitrofurantoin Macrocrystal Other (  See Comments)    Reported by [facility] 2013 - unknown reaction      PERTINENT MEDICATIONS:  Outpatient Encounter Medications as of 04/29/2020  Medication Sig  . acetaminophen (TYLENOL) 500 MG tablet Take 500 mg by mouth 4 (four) times daily as needed.  Marland Kitchen albuterol (PROVENTIL HFA;VENTOLIN HFA) 108 (90 Base) MCG/ACT inhaler Inhale 2 puffs into the lungs every 6 (six) hours as needed for wheezing or shortness of breath.  Marland Kitchen amLODipine (NORVASC) 5 MG tablet Take 1 tablet (5 mg total) by mouth daily.  . ASPIRIN LOW DOSE 81 MG EC tablet TAKE ONE TABLET BY MOUTH EVERY DAY  . Cholecalciferol (VITAMIN D3 PO) Take 8,000 Units by mouth daily.   . Cholecalciferol (VITAMIN D3) 50 MCG (2000 UT) TABS TAKE ONE TABLET BY MOUTH EVERY DAY  . clopidogrel (PLAVIX) 75 MG tablet TAKE ONE TABLET BY MOUTH EVERY DAY  . Dextran 70-Hypromellose (NATURAL BALANCE TEARS OP) Apply to eye. 1.4%, 2 drops each eye  . DILANTIN 30 MG ER capsule Take 1 capsule (30 mg total) by mouth at bedtime.  Marland Kitchen ezetimibe (ZETIA) 10 MG tablet TAKE ONE  TABLET BY MOUTH EVERY DAY  . FEROSUL 325 (65 Fe) MG tablet TAKE ONE TABLET BY MOUTH EVERY TWO DAYS with FOUR OUNCE of orange JUICE  . fluticasone (FLONASE) 50 MCG/ACT nasal spray Place 2 sprays into both nostrils daily.  . furosemide (LASIX) 20 MG tablet Take 1 tablet (20 mg total) by mouth daily as needed for edema.  Marland Kitchen guaiFENesin (MUCINEX) 600 MG 12 hr tablet Take by mouth 2 (two) times daily.  . Insulin Aspart FlexPen 100 UNIT/ML SOPN INJECT subcutaneously THREE TIMES DAILY PER sliding scales BLOOD SUGAR (150 NO INSULIN)+ 151-180 Give FOUR units. 181-220 Give SIX units, 221-260 Give EIGHT units, 261-300 Give 10 units 301-340 Give 12 units AND > 341 Give 14 units  . isosorbide mononitrate (IMDUR) 30 MG 24 hr tablet TAKE ONE TABLET BY MOUTH EVERY DAY  . lamoTRIgine (LAMICTAL) 200 MG tablet TAKE 1.5 TABLETS BY MOUTH TWICE DAILY at 8 am AND 2pm  . LANTUS SOLOSTAR 100 UNIT/ML Solostar Pen INJECT 20 units AT BEDTIME  . losartan (COZAAR) 50 MG tablet TAKE ONE TABLET BY MOUTH EVERY DAY  . metoprolol tartrate (LOPRESSOR) 50 MG tablet TAKE ONE TABLET BY MOUTH TWICE DAILY  . Omega-3 Fatty Acids (FISH OIL) 1000 MG CAPS TAKE TWO CAPSULES BY MOUTH EVERY DAY  . phenytoin (DILANTIN) 100 MG ER capsule Take 3 capsules (300 mg total) by mouth daily.  . rosuvastatin (CRESTOR) 40 MG tablet TAKE ONE TABLET BY MOUTH EVERY DAY  . Spacer/Aero Chamber Mouthpiece MISC 1 Device by Does not apply route daily as needed.  Marland Kitchen SPIRIVA HANDIHALER 18 MCG inhalation capsule place ONE capsule into inhaler AND inhale EVERY DAY  . spironolactone (ALDACTONE) 50 MG tablet TAKE ONE TABLET BY MOUTH EVERY DAY  . tiZANidine (ZANAFLEX) 4 MG tablet Take 0.5 tablets (2 mg total) by mouth every 12 (twelve) hours as needed for muscle spasms.  . White Petrolatum-Mineral Oil (ARTIFICIAL TEARS) ointment Place into both eyes as needed.   No facility-administered encounter medications on file as of 04/29/2020.     ROS  General: NAD EYES: denies  vision changes ENMT: denies dysphagia no xerostomia Cardiovascular: denies chest pain Pulmonary: denies cough, denies SOB,  Abdomen: endorses fair appetite, no constipation,or diarrhea GU: denies dysuria or urinary frquency [id number] gait has improved; using wheelchair and rolling walker for support Skin: denies rashes or wounds  Neurological: endorses weakness, denies pain, denies insomnia Psych: Endorses positive mood Heme/lymph/immuno: denies bruises, abnormal bleeding   PHYSICAL EXAM  Constitutional: In no acute distress,  Cardiovascular: regular rate and rhythm, no edema in BLE Pulmonary: no cough, no increased work of breathing, adventitious lung sounds on auscultation, normal respiratory effort on room air Abdomen: soft, non tender, positive bowel sounds in all quadrants GU:  no suprapubic tenderness Eyes: Normal lids, no discharge, sclera anicteric ENMT: Moist mucous membranes Musculoskeletal: Ambulatory with assistive device Skin: no rash to visible skin, warm without cyanosis Psych: non-anxious affect Neurological: Weakness but otherwise non focal Heme/lymph/immuno: no bruises, no bleeding  I spent 60 minutes providing this consultation; time includes spent with patient/family, chart review and documentation. More than 50% of the time in this consultation was spent on coordinating communication   Thank you for the opportunity to participate in the care of [place] Please call our office at [phone] if we can be of additional assistance.  Note: Portions of this note were generated with Lobbyist. Dictation errors may occur despite best attempts at proofreading.  Teodoro Spray, NP

## 2020-05-06 ENCOUNTER — Telehealth: Payer: Self-pay

## 2020-05-06 DIAGNOSIS — R0781 Pleurodynia: Secondary | ICD-10-CM | POA: Diagnosis not present

## 2020-05-06 DIAGNOSIS — W19XXXA Unspecified fall, initial encounter: Secondary | ICD-10-CM | POA: Diagnosis not present

## 2020-05-06 NOTE — Telephone Encounter (Signed)
[facility] called in because pt had a fall this morning and is complaining of left rib pain. Spoke with Dr. Martinique and she approved an order for an x-ray. I called back and left a message with the operator that the x-ray order has been approved.

## 2020-05-19 ENCOUNTER — Telehealth: Payer: Self-pay | Admitting: Family Medicine

## 2020-05-19 NOTE — Telephone Encounter (Signed)
Order signed & faxed back.

## 2020-05-19 NOTE — Telephone Encounter (Signed)
Josie Dixon from [employer] is calling and wanted to see if provider received an order for patient to have speech therapy for swallowing and choking issues. Nurse can take a verbal order at [phone]

## 2020-05-19 NOTE — Telephone Encounter (Signed)
In your bin to be signed.  

## 2020-05-20 NOTE — Telephone Encounter (Signed)
Josie Dixon from [employer] called back and stated that they still haven't received the order and wanted to see if it can be faxed again to [phone]. CB is [phone]

## 2020-05-20 NOTE — Telephone Encounter (Signed)
Form re-faxed

## 2020-05-22 DIAGNOSIS — M1990 Unspecified osteoarthritis, unspecified site: Secondary | ICD-10-CM | POA: Diagnosis not present

## 2020-05-22 DIAGNOSIS — R296 Repeated falls: Secondary | ICD-10-CM | POA: Diagnosis not present

## 2020-06-21 DIAGNOSIS — R296 Repeated falls: Secondary | ICD-10-CM | POA: Diagnosis not present

## 2020-06-21 DIAGNOSIS — M1990 Unspecified osteoarthritis, unspecified site: Secondary | ICD-10-CM | POA: Diagnosis not present

## 2020-07-14 ENCOUNTER — Other Ambulatory Visit: Payer: Self-pay

## 2020-07-14 ENCOUNTER — Other Ambulatory Visit: Payer: Self-pay | Admitting: Family Medicine

## 2020-07-14 ENCOUNTER — Other Ambulatory Visit (INDEPENDENT_AMBULATORY_CARE_PROVIDER_SITE_OTHER): Payer: HMO

## 2020-07-14 DIAGNOSIS — I10 Essential (primary) hypertension: Secondary | ICD-10-CM

## 2020-07-14 DIAGNOSIS — E1149 Type 2 diabetes mellitus with other diabetic neurological complication: Secondary | ICD-10-CM

## 2020-07-14 LAB — COMPREHENSIVE METABOLIC PANEL
ALT: 23 U/L (ref 0–35)
AST: 29 U/L (ref 0–37)
Albumin: 4.3 g/dL (ref 3.5–5.2)
Alkaline Phosphatase: 106 U/L (ref 39–117)
BUN: 14 mg/dL (ref 6–23)
CO2: 23 mEq/L (ref 19–32)
Calcium: 9.9 mg/dL (ref 8.4–10.5)
Chloride: 99 mEq/L (ref 96–112)
Creatinine, Ser: 0.94 mg/dL (ref 0.40–1.20)
GFR: 59.72 mL/min — ABNORMAL LOW (ref >60.00)
Glucose, Bld: 85 mg/dL (ref 70–99)
Potassium: 4.4 mEq/L (ref 3.5–5.1)
Sodium: 132 mEq/L — ABNORMAL LOW (ref 135–145)
Total Bilirubin: 0.3 mg/dL (ref 0.2–1.2)
Total Protein: 9.8 g/dL — ABNORMAL HIGH (ref 6.0–8.3)

## 2020-07-14 LAB — MICROALBUMIN / CREATININE URINE RATIO
Creatinine,U: 54.3 mg/dL
Microalb Creat Ratio: 22 mg/g (ref 0.0–30.0)
Microalb, Ur: 11.9 mg/dL — ABNORMAL HIGH (ref 0.0–1.9)

## 2020-07-14 LAB — HEMOGLOBIN A1C: Hgb A1c MFr Bld: 6.2 % (ref 4.6–6.5)

## 2020-07-16 LAB — FRUCTOSAMINE: Fructosamine: 265 umol/L (ref 205–285)

## 2020-07-21 ENCOUNTER — Ambulatory Visit: Payer: HMO | Admitting: Family Medicine

## 2020-07-21 ENCOUNTER — Other Ambulatory Visit: Payer: Self-pay | Admitting: Family Medicine

## 2020-07-22 DIAGNOSIS — M1990 Unspecified osteoarthritis, unspecified site: Secondary | ICD-10-CM | POA: Diagnosis not present

## 2020-07-22 DIAGNOSIS — R296 Repeated falls: Secondary | ICD-10-CM | POA: Diagnosis not present

## 2020-07-25 ENCOUNTER — Other Ambulatory Visit: Payer: Self-pay

## 2020-07-25 ENCOUNTER — Non-Acute Institutional Stay: Payer: HMO | Admitting: Hospice

## 2020-07-25 DIAGNOSIS — E1169 Type 2 diabetes mellitus with other specified complication: Secondary | ICD-10-CM

## 2020-07-25 DIAGNOSIS — F0390 Unspecified dementia without behavioral disturbance: Secondary | ICD-10-CM

## 2020-07-25 DIAGNOSIS — E782 Mixed hyperlipidemia: Secondary | ICD-10-CM | POA: Diagnosis not present

## 2020-07-25 DIAGNOSIS — J449 Chronic obstructive pulmonary disease, unspecified: Secondary | ICD-10-CM | POA: Diagnosis not present

## 2020-07-25 DIAGNOSIS — Z515 Encounter for palliative care: Secondary | ICD-10-CM

## 2020-07-25 NOTE — Progress Notes (Signed)
[facility] Consult Note Telephone: [phone]  Fax: [phone]  PATIENT NAME: Kathryn Lewis DOB: 1945-05-04 MRN: [id number]  PRIMARY CARE PROVIDER:   Martinique, Betty G, MD Martinique, Betty G, MD [place],  [place] [address]  REFERRING PROVIDER: Martinique, Betty G, MD Martinique, Betty G, MD [place],  [place] [address]  RESPONSIBLE PARTY:  daughter [name] is the Legal [place]     Name Relation Home Work Mobile   [place]  [place] Daughter   [phone]       Visit is to build trust and highlight Palliative Medicine as specialized medical care for people living with serious illness, aimed at facilitating better quality of life through symptoms relief, assisting with advance care planning and complex medical decision making. This is a follow up visit.  RECOMMENDATIONS/PLAN:   Advance Care Planning/Code Status:Patient is a Do Not Resuscitate  Goals of Care: Goals of care include to maximize quality of life and symptom management.  Visit consisted of counseling and education dealing with the complex and emotionally intense issues of symptom management and palliative care in the setting of serious and potentially life-threatening illness. Palliative care team will continue to support patient, patient's family, and medical team.  Symptom management/Plan:  HTN: Managed with Aldactone, Amlodipine and Metoprolol.  Type 2 DM well managed insulin. Continue ongoing care, reduced concentrated sweet. Last A1c 07/14/20 is 6.2.  Hyperlipidemia: Crestor, Ezetimbe No recent COPD exacerbation.  Breathing treatments on hand as needed for respiratory difficulty. Patient continues to get around self propelling her wheel chair. No reported fall since last visit. - April 2022. Fall precautions is ongoing.   Follow up: Palliative care will continue to follow for complex medical decision making, advance  care planning, and clarification of goals. Return 6 weeks or prn. Encouraged to call provider sooner with any concerns.  CHIEF COMPLAINT: Palliative follow up  HISTORY OF PRESENT ILLNESS:  Kathryn Lewis a 75 y.o. female with multiple medical problems including Hx of HTN, Type 2 DM, hyperlipdemia, CAD, s/pCVA, gait disorder, STML, COPD, OSA.  History obtained from review of EMR, discussion with primary team, family and/or patient. Records reviewed and summarized above. All 10 point systems reviewed and are negative except as documented in history of present illness above  Review and summarization of Epic records shows history from other than patient.   Palliative Care was asked to follow this patient o help address complex decision making in the context of advance care planning and goals of care clarification.   PHYSICAL EXAM  General: In no acute distress, appropriately dressed Cardiovascular: regular rate and rhythm; no edema in BLE Pulmonary: no cough, no increased work of breathing, normal respiratory effort Abdomen: soft, non tender, no guarding, positive bowel sounds in all quadrants GU:  no suprapubic tenderness Eyes: Normal lids, no discharge ENMT: Moist mucous membranes Musculoskeletal:  weakness; gets around mostly by self propelling her wheelchair Skin: no rash to visible skin, warm without cyanosis,  Psych: non-anxious affect Neurological: Weakness but otherwise non focal Heme/lymph/immuno: no bruises, no bleeding  PERTINENT MEDICATIONS:  Outpatient Encounter Medications as of 07/25/2020  Medication Sig   acetaminophen (TYLENOL) 500 MG tablet Take 500 mg by mouth 4 (four) times daily as needed.   albuterol (PROVENTIL HFA;VENTOLIN HFA) 108 (90 Base) MCG/ACT inhaler Inhale 2 puffs into the lungs every 6 (six) hours as needed for wheezing or shortness of breath.   amLODipine (NORVASC) 5 MG  tablet Take 1 tablet (5 mg total) by mouth daily.   ASPIRIN LOW DOSE 81 MG EC tablet TAKE  ONE TABLET BY MOUTH EVERY DAY   Cholecalciferol (VITAMIN D3 PO) Take 8,000 Units by mouth daily.    Cholecalciferol (VITAMIN D3) 50 MCG (2000 UT) TABS TAKE ONE TABLET BY MOUTH EVERY DAY   clopidogrel (PLAVIX) 75 MG tablet TAKE ONE TABLET BY MOUTH EVERY DAY   Dextran 70-Hypromellose (NATURAL BALANCE TEARS OP) Apply to eye. 1.4%, 2 drops each eye   DILANTIN 30 MG ER capsule Take 1 capsule (30 mg total) by mouth at bedtime.   ezetimibe (ZETIA) 10 MG tablet TAKE ONE TABLET BY MOUTH EVERY DAY   FEROSUL 325 (65 Fe) MG tablet TAKE ONE TABLET BY MOUTH EVERY TWO DAYS with FOUR OUNCE of orange JUICE   fluticasone (FLONASE) 50 MCG/ACT nasal spray Place 2 sprays into both nostrils daily.   furosemide (LASIX) 20 MG tablet Take 1 tablet (20 mg total) by mouth daily as needed for edema.   guaiFENesin (MUCINEX) 600 MG 12 hr tablet Take by mouth 2 (two) times daily.   Insulin Aspart FlexPen 100 UNIT/ML SOPN INJECT subcutaneously THREE TIMES DAILY PER sliding scales BLOOD SUGAR (150 NO INSULIN)+ 151-180 Give FOUR units. 181-220 Give SIX units, 221-260 Give EIGHT units, 261-300 Give 10 units 301-340 Give 12 units AND > 341 Give 14 units   isosorbide mononitrate (IMDUR) 30 MG 24 hr tablet TAKE ONE TABLET BY MOUTH EVERY DAY   lamoTRIgine (LAMICTAL) 200 MG tablet TAKE 1.5 TABLETS BY MOUTH TWICE DAILY at 8 am AND 2pm   LANTUS SOLOSTAR 100 UNIT/ML Solostar Pen INJECT 20 units AT BEDTIME   losartan (COZAAR) 50 MG tablet TAKE ONE TABLET BY MOUTH EVERY DAY   metoprolol tartrate (LOPRESSOR) 50 MG tablet TAKE ONE TABLET BY MOUTH TWICE DAILY   Omega-3 Fatty Acids (FISH OIL) 1000 MG CAPS TAKE TWO CAPSULES BY MOUTH EVERY DAY   phenytoin (DILANTIN) 100 MG ER capsule Take 3 capsules (300 mg total) by mouth daily.   rosuvastatin (CRESTOR) 40 MG tablet TAKE ONE TABLET BY MOUTH EVERY DAY   Spacer/Aero Chamber Mouthpiece MISC 1 Device by Does not apply route daily as needed.   SPIRIVA HANDIHALER 18 MCG inhalation capsule place ONE  capsule into inhaler AND inhale EVERY DAY   spironolactone (ALDACTONE) 50 MG tablet TAKE ONE TABLET BY MOUTH EVERY DAY   tiZANidine (ZANAFLEX) 4 MG tablet Take 0.5 tablets (2 mg total) by mouth every 12 (twelve) hours as needed for muscle spasms.   White Petrolatum-Mineral Oil (ARTIFICIAL TEARS) ointment Place into both eyes as needed.   No facility-administered encounter medications on file as of 07/25/2020.    HOSPICE ELIGIBILITY/DIAGNOSIS: TBD  PAST MEDICAL HISTORY:  Past Medical History:  Diagnosis Date   Allergy    Arthritis    Bipolar 1 disorder ([place])    Blood transfusion without reported diagnosis    Carotid artery stenosis    50-69% bilateral ICA stenoses by velocity criteria but no visible plaque and ICA/CCA ratio 1.72 on right and 1.38 on left   Coronary artery disease    Depression    Diabetes mellitus without complication (HCC)    Echocardiogram    Echo 06/2018: Hyperdynamic systolic function, EF >97, mild concentric LVH, elevated LVEDP (E/E' suggests impaired relaxation), mild AI, lipomatous interatrial septum   Hypertension    Left adrenal mass (HCC)    2.5 cm L adrenal mass on CT per note of Dr. Seleta Rhymes  05/20/2014   MI, old    PAD (peripheral artery disease) ([place])    03/2014 - CT aortogram with lower extremity runoff (mild to moderate disease in common iliac arteries, complete occulusion of her bilateral external iliac arteries with heavily disease common femoral arteries. She also has disease to her SFAs bilaterally. Popliteal arteries and tibial vessel runoff appears to be adequate)   Seizures [facility])    Stroke [facility])      Results for SHAYRA, ANTON (MRN [id number]) as of 07/25/2020 09:23  Ref. Range 07/14/2020 09:50  Sodium Latest Ref Range: 135 - 145 mEq/L 132 (L)  Potassium Latest Ref Range: 3.5 - 5.1 mEq/L 4.4  Chloride Latest Ref Range: 96 - 112 mEq/L 99  CO2 Latest Ref Range: 19 - 32 mEq/L 23  Glucose Latest Ref Range: 70 - 99 mg/dL 85  BUN Latest Ref Range: 6 -  23 mg/dL 14  Creatinine Latest Ref Range: 0.40 - 1.20 mg/dL 0.94  Calcium Latest Ref Range: 8.4 - 10.5 mg/dL 9.9  Alkaline Phosphatase Latest Ref Range: 39 - 117 U/L 106  Albumin Latest Ref Range: 3.5 - 5.2 g/dL 4.3  AST Latest Ref Range: 0 - 37 U/L 29  ALT Latest Ref Range: 0 - 35 U/L 23  Total Protein Latest Ref Range: 6.0 - 8.3 g/dL 9.8 (H)  Total Bilirubin Latest Ref Range: 0.2 - 1.2 mg/dL 0.3  GFR Latest Ref Range: >60.00 mL/min 59.72 (L)    ALLERGIES:  Allergies  Allergen Reactions   Influenza Vaccines Anaphylaxis   Amoxicillin Other (See Comments)    colitis   Augmentin [Amoxicillin-Pot Clavulanate] Other (See Comments)    colitis   Cefadroxil Other (See Comments)    Reported by [facility] 2013 - unknown reaction   Cephalexin Other (See Comments)    colitis   Erythromycin Other (See Comments)    colitis   Niacin And Related Other (See Comments)    colitis   Nitrofurantoin Macrocrystal Other (See Comments)    Reported by [facility] 2013 - unknown reaction      I spent  50 minutes providing this consultation; this includes time spent with patient/family, chart review and documentation. More than 50% of the time in this consultation was spent on counseling and coordinating communication   Thank you for the opportunity to participate in the care of [place] Please call our office at [phone] if we can be of additional assistance.  Note: Portions of this note were generated with Lobbyist. Dictation errors may occur despite best attempts at proofreading.  Teodoro Spray, NP

## 2020-07-30 ENCOUNTER — Ambulatory Visit (INDEPENDENT_AMBULATORY_CARE_PROVIDER_SITE_OTHER): Payer: HMO | Admitting: Family Medicine

## 2020-07-30 ENCOUNTER — Other Ambulatory Visit: Payer: Self-pay

## 2020-07-30 ENCOUNTER — Encounter: Payer: Self-pay | Admitting: Family Medicine

## 2020-07-30 VITALS — BP 128/80 | HR 62 | Resp 16 | Ht 61.0 in

## 2020-07-30 DIAGNOSIS — I739 Peripheral vascular disease, unspecified: Secondary | ICD-10-CM

## 2020-07-30 DIAGNOSIS — G629 Polyneuropathy, unspecified: Secondary | ICD-10-CM

## 2020-07-30 DIAGNOSIS — J449 Chronic obstructive pulmonary disease, unspecified: Secondary | ICD-10-CM | POA: Diagnosis not present

## 2020-07-30 DIAGNOSIS — E1149 Type 2 diabetes mellitus with other diabetic neurological complication: Secondary | ICD-10-CM

## 2020-07-30 DIAGNOSIS — I119 Hypertensive heart disease without heart failure: Secondary | ICD-10-CM

## 2020-07-30 NOTE — Patient Instructions (Signed)
A few things to remember from today's visit:   Diabetes mellitus type 2 with neurological manifestations ([place])  Hypertension with heart disease  Type II diabetes mellitus with neurological manifestations ([place])  Chronic obstructive pulmonary disease, unspecified COPD type ([place])  If you need refills please call your pharmacy. Do not use My Chart to request refills or for acute issues that need immediate attention.   No changes today. Continue fall precautions, try to use a cane or walker. Dry mouth can be caused by some of your medications.   Please be sure medication list is accurate. If a new problem present, please set up appointment sooner than planned today.

## 2020-07-30 NOTE — Progress Notes (Signed)
HPI: Kathryn Lewis is a 75 y.o. female, who is here today with her daughter for 6 months follow up.   She was last seen on 03/12/20 for acute visit. She has not had falls in the past couple months. Her daughter is reporting that she has been feeling well in the past few months. She seems happier and not having new health issues. She lives at [facility] assisted living and she is in palliative care Her daughter is fostering a 34 yo and he has been visiting her, this has helped with her mood. She is walking more, still spends some of the day in her wheel chair. She is able to walk to the bathroom ,sometimes with no assistance, taking showers,and preforming small chores. Enjoys playing game in her tablet.  Following with Dr Delice Lesch for seizure disorder.  DM II: She is on Lantus 20 U at bedtime. She is on Novolog per sliding scale, 2-3 U daily. She is not sure about BS;s but her daughter has been told they have been "good." + Peripheral neuropathy and unstable gait.  Lab Results  Component Value Date   HGBA1C 6.2 07/14/2020  She is eating food provided at the facility but her daughter is planning on starting cooking some of ehr meals. In the past 6 months facility has had 8 chefs   HTN: She is on Spironolactone 50 mg daily,Metoprolol tartrate 50 mg bid,Imdur 30 mg daily,Losartan 50 mg daily,and Amlodipine 5 mg daily. She has not had headaches,CP,SOB,focal neurologic weakness,worsening edema,or MS changes. BP is checked periodically at [facility].  PAD on PLavix 75 mg daily,Aspirin 81 mg daily,Crestor 40 mg daily,and Zetia 10 mg daily. Lab Results  Component Value Date   CHOL 183 09/09/2017   HDL 66 09/09/2017   LDLCALC 85 09/09/2017   TRIG 160 (H) 09/09/2017   CHOLHDL 2.8 09/09/2017    Lab Results  Component Value Date   CREATININE 0.94 07/14/2020   BUN 14 07/14/2020   NA 132 (L) 07/14/2020   K 4.4 07/14/2020   CL 99 07/14/2020   CO2 23 07/14/2020   Lab  Results  Component Value Date   ALT 23 07/14/2020   AST 29 07/14/2020   ALKPHOS 106 07/14/2020   BILITOT 0.3 07/14/2020   Dry mouth. She has used OTC products, not very effective. She was evaluated by dentist and needs teeth removed, completing abx treatment. Negative for oral lesions.  COPD: She is on Spiriva daily. She has not needed her Albuterol inh in a while. Negative for wheezing. Occasional cough, non productive.  Review of Systems  Constitutional:  Positive for fatigue. Negative for activity change, appetite change and fever.  HENT:  Positive for dental problem. Negative for mouth sores, nosebleeds and sore throat.   Eyes:  Negative for redness and visual disturbance.  Gastrointestinal:  Negative for abdominal pain, nausea and vomiting.       Negative for changes in bowel habits.  Genitourinary:  Negative for decreased urine volume, dysuria and hematuria.  Musculoskeletal:  Positive for arthralgias and gait problem.  Skin:  Negative for pallor and rash.  Neurological:  Negative for syncope, facial asymmetry and weakness.  Psychiatric/Behavioral:  Negative for confusion and hallucinations.   Rest of ROS, see pertinent positives sand negatives in HPI  Current Outpatient Medications on File Prior to Visit  Medication Sig Dispense Refill   acetaminophen (TYLENOL) 500 MG tablet Take 500 mg by mouth 4 (four) times daily as needed.  albuterol (PROVENTIL HFA;VENTOLIN HFA) 108 (90 Base) MCG/ACT inhaler Inhale 2 puffs into the lungs every 6 (six) hours as needed for wheezing or shortness of breath. 1 Inhaler 6   amLODipine (NORVASC) 5 MG tablet Take 1 tablet (5 mg total) by mouth daily. 30 tablet 5   ASPIRIN LOW DOSE 81 MG EC tablet TAKE ONE TABLET BY MOUTH EVERY DAY 30 tablet 2   Cholecalciferol (VITAMIN D3 PO) Take 8,000 Units by mouth daily.      Cholecalciferol (VITAMIN D3) 50 MCG (2000 UT) TABS TAKE ONE TABLET BY MOUTH EVERY DAY 30 tablet 5   clopidogrel (PLAVIX) 75 MG  tablet TAKE ONE TABLET BY MOUTH EVERY DAY 30 tablet 5   Dextran 70-Hypromellose (NATURAL BALANCE TEARS OP) Apply to eye. 1.4%, 2 drops each eye     DILANTIN 30 MG ER capsule Take 1 capsule (30 mg total) by mouth at bedtime. 90 capsule 3   ezetimibe (ZETIA) 10 MG tablet TAKE ONE TABLET BY MOUTH EVERY DAY 30 tablet 5   FEROSUL 325 (65 Fe) MG tablet TAKE ONE TABLET BY MOUTH EVERY TWO DAYS with FOUR OUNCE of orange JUICE 100 tablet 0   fluticasone (FLONASE) 50 MCG/ACT nasal spray Place 2 sprays into both nostrils daily. 16 g 6   furosemide (LASIX) 20 MG tablet Take 1 tablet (20 mg total) by mouth daily as needed for edema. 30 tablet 3   Insulin Aspart FlexPen 100 UNIT/ML SOPN INJECT subcutaneously THREE TIMES DAILY PER sliding scales BLOOD SUGAR (150 NO INSULIN)+ 151-180 Give FOUR units. 181-220 Give SIX units, 221-260 Give EIGHT units, 261-300 Give 10 units 301-340 Give 12 units AND > 341 Give 14 units 15 mL 1   isosorbide mononitrate (IMDUR) 30 MG 24 hr tablet TAKE ONE TABLET BY MOUTH EVERY DAY 30 tablet 5   lamoTRIgine (LAMICTAL) 200 MG tablet TAKE 1.5 TABLETS BY MOUTH TWICE DAILY at 8 am AND 2pm 270 tablet 3   LANTUS SOLOSTAR 100 UNIT/ML Solostar Pen INJECT 20 units AT BEDTIME 15 mL 1   losartan (COZAAR) 50 MG tablet TAKE ONE TABLET BY MOUTH EVERY DAY 30 tablet 5   metoprolol tartrate (LOPRESSOR) 50 MG tablet TAKE ONE TABLET BY MOUTH TWICE DAILY 60 tablet 5   Omega-3 Fatty Acids (FISH OIL) 1000 MG CAPS TAKE TWO CAPSULES BY MOUTH EVERY DAY 60 capsule 5   phenytoin (DILANTIN) 100 MG ER capsule Take 3 capsules (300 mg total) by mouth daily. 270 capsule 3   rosuvastatin (CRESTOR) 40 MG tablet TAKE ONE TABLET BY MOUTH EVERY DAY 30 tablet 5   Spacer/Aero Chamber Mouthpiece MISC 1 Device by Does not apply route daily as needed. 1 each 0   SPIRIVA HANDIHALER 18 MCG inhalation capsule place ONE capsule into inhaler AND inhale EVERY DAY 30 capsule 5   spironolactone (ALDACTONE) 50 MG tablet TAKE ONE TABLET  BY MOUTH EVERY DAY 30 tablet 5   tiZANidine (ZANAFLEX) 4 MG tablet Take 0.5 tablets (2 mg total) by mouth every 12 (twelve) hours as needed for muscle spasms. 30 tablet 2   White Petrolatum-Mineral Oil (ARTIFICIAL TEARS) ointment Place into both eyes as needed.     No current facility-administered medications on file prior to visit.   Past Medical History:  Diagnosis Date   Allergy    Arthritis    Bipolar 1 disorder ([place])    Blood transfusion without reported diagnosis    Carotid artery stenosis    50-69% bilateral ICA stenoses by velocity criteria but  no visible plaque and ICA/CCA ratio 1.72 on right and 1.38 on left   Coronary artery disease    Depression    Diabetes mellitus without complication (HCC)    Echocardiogram    Echo 06/2018: Hyperdynamic systolic function, EF >32, mild concentric LVH, elevated LVEDP (E/E' suggests impaired relaxation), mild AI, lipomatous interatrial septum   Hypertension    Left adrenal mass (HCC)    2.5 cm L adrenal mass on CT per note of Dr. Seleta Rhymes 05/20/2014   MI, old    PAD (peripheral artery disease) ([place])    03/2014 - CT aortogram with lower extremity runoff (mild to moderate disease in common iliac arteries, complete occulusion of her bilateral external iliac arteries with heavily disease common femoral arteries. She also has disease to her SFAs bilaterally. Popliteal arteries and tibial vessel runoff appears to be adequate)   Seizures (HCC)    Stroke (HCC)    Allergies  Allergen Reactions   Influenza Vaccines Anaphylaxis   Amoxicillin Other (See Comments)    colitis   Augmentin [Amoxicillin-Pot Clavulanate] Other (See Comments)    colitis   Cefadroxil Other (See Comments)    Reported by [facility] 2013 - unknown reaction   Cephalexin Other (See Comments)    colitis   Erythromycin Other (See Comments)    colitis   Niacin And Related Other (See Comments)    colitis   Nitrofurantoin Macrocrystal Other (See Comments)    Reported by [facility] 2013 - unknown reaction    Social History   Socioeconomic History   Marital status: Single    Spouse name: Not on file   Number of children: 2   Years of education: Not on file   Highest education level: Not on file  Occupational History   Not on file  Tobacco Use   Smoking status: Former    Packs/day: 1.00    Pack years: 0.00    Types: Cigarettes    Quit date: 2015    Years since quitting: 7.5   Smokeless tobacco: Never  Vaping Use   Vaping Use: Never used  Substance and Sexual Activity   Alcohol use: No   Drug use: No   Sexual activity: Not on file  Other Topics Concern   Not on file  Social History Narrative   Pt lives in assisted living facility   Has 2 adult children, she is widowed, husband died in his early 31's in a work related accident   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   Social Determinants of Radio broadcast assistant Strain: Low Risk    Difficulty of Paying Living Expenses: Not hard at all  Food Insecurity: No Food Insecurity   Worried About Charity fundraiser in the Last Year: Never true   Arboriculturist in the Last Year: Never true  Transportation Needs: No Transportation Needs   Lack of Transportation (Medical): No   Lack of Transportation (Non-Medical): No  Physical Activity: Inactive   Days of Exercise per Week: 0 days   Minutes of Exercise per Session: 0 min  Stress: No Stress Concern Present   Feeling of Stress : Not at all  Social Connections: Socially Isolated   Frequency of Communication with Friends and Family: More than three times a week   Frequency of Social Gatherings with Friends and Family: More than three times a week   Attends Religious Services: Never   Marine scientist or Organizations: No  Attends Archivist Meetings: Never   Marital Status: Widowed   Vitals:   07/30/20 0820  BP: 128/80  Pulse: 62  Resp: 16  SpO2: 98%   Body mass index is 24.68 kg/m.  Physical Exam Vitals and  nursing note reviewed.  Constitutional:      General: She is not in acute distress.    Appearance: She is well-developed.  HENT:     Head: Normocephalic and atraumatic.     Mouth/Throat:     Mouth: Mucous membranes are dry.     Pharynx: Oropharynx is clear.  Eyes:     Conjunctiva/sclera: Conjunctivae normal.  Cardiovascular:     Rate and Rhythm: Normal rate and regular rhythm.     Heart sounds: No murmur heard.    Comments: PT pulses present. Pulmonary:     Effort: Pulmonary effort is normal. No respiratory distress.     Breath sounds: Normal breath sounds.  Abdominal:     Palpations: Abdomen is soft.     Tenderness: There is no abdominal tenderness.  Lymphadenopathy:     Cervical: No cervical adenopathy.  Skin:    General: Skin is warm.     Findings: No erythema or rash.  Neurological:     General: No focal deficit present.     Mental Status: She is alert.     Cranial Nerves: No cranial nerve deficit.     Comments: She is in a wheel chair. Oriented in person and place.  Psychiatric:     Comments: Well groomed, good eye contact.   ASSESSMENT AND PLAN:  Kathryn Lewis was seen today for 6 months follow-up.  Diagnoses and all orders for this visit:  Diabetes mellitus type 2 with neurological manifestations ([place]) HgA1C at goal. Continue Lantus 20 U daily. Annual eye exam, periodic dental and foot care recommended. F/U in 5-6 months  Hypertension with heart disease BP adequately controlled. Continue Metoprolol tartrate 50 mg bid,Spironolactone 50 mg daily,Imdur 30 mg daily,Losartan 50 mg daily,and Amlodipine 5 mg daily. Low salt diet recommended.  Chronic obstructive pulmonary disease, unspecified COPD type ([place]) Well controlled. Continue Spiriva daily and Albuterol inh 2 puff qid prn.  Peripheral polyneuropathy Continue appropriate foot care and fall precautions.  PAD (peripheral artery disease) (HCC) Continue Plavix 75 mg daily,Zetia 10 mg,and Crestor  40 mg daily. Side effects of medications discussed.   Return in about 6 months (around 01/30/2021).   Valeria Boza G. Martinique, MD  [facility]. [place] office.

## 2020-08-21 DIAGNOSIS — R296 Repeated falls: Secondary | ICD-10-CM | POA: Diagnosis not present

## 2020-08-21 DIAGNOSIS — M1990 Unspecified osteoarthritis, unspecified site: Secondary | ICD-10-CM | POA: Diagnosis not present

## 2020-09-03 ENCOUNTER — Other Ambulatory Visit: Payer: Self-pay | Admitting: Family Medicine

## 2020-09-03 DIAGNOSIS — E1149 Type 2 diabetes mellitus with other diabetic neurological complication: Secondary | ICD-10-CM

## 2020-09-05 ENCOUNTER — Other Ambulatory Visit: Payer: Self-pay | Admitting: Family Medicine

## 2020-09-05 DIAGNOSIS — J441 Chronic obstructive pulmonary disease with (acute) exacerbation: Secondary | ICD-10-CM

## 2020-09-05 DIAGNOSIS — I10 Essential (primary) hypertension: Secondary | ICD-10-CM

## 2020-09-10 ENCOUNTER — Other Ambulatory Visit: Payer: Self-pay | Admitting: Family Medicine

## 2020-09-10 DIAGNOSIS — I2510 Atherosclerotic heart disease of native coronary artery without angina pectoris: Secondary | ICD-10-CM

## 2020-09-10 DIAGNOSIS — I119 Hypertensive heart disease without heart failure: Secondary | ICD-10-CM

## 2020-09-21 DIAGNOSIS — M1990 Unspecified osteoarthritis, unspecified site: Secondary | ICD-10-CM | POA: Diagnosis not present

## 2020-09-21 DIAGNOSIS — R296 Repeated falls: Secondary | ICD-10-CM | POA: Diagnosis not present

## 2020-09-25 ENCOUNTER — Other Ambulatory Visit: Payer: Self-pay | Admitting: Family Medicine

## 2020-10-08 ENCOUNTER — Other Ambulatory Visit: Payer: Self-pay | Admitting: Family Medicine

## 2020-10-14 ENCOUNTER — Other Ambulatory Visit: Payer: Self-pay | Admitting: Family Medicine

## 2020-10-21 ENCOUNTER — Other Ambulatory Visit: Payer: Self-pay

## 2020-10-21 ENCOUNTER — Ambulatory Visit: Payer: HMO | Admitting: Neurology

## 2020-10-21 ENCOUNTER — Encounter: Payer: Self-pay | Admitting: Neurology

## 2020-10-21 VITALS — BP 145/66 | HR 74 | Resp 18 | Ht 63.0 in | Wt 133.0 lb

## 2020-10-21 DIAGNOSIS — G40209 Localization-related (focal) (partial) symptomatic epilepsy and epileptic syndromes with complex partial seizures, not intractable, without status epilepticus: Secondary | ICD-10-CM

## 2020-10-21 MED ORDER — PHENYTOIN SODIUM EXTENDED 100 MG PO CAPS: 300.0000 mg | ORAL_CAPSULE | Freq: Every day | ORAL | 3 refills | Status: DC

## 2020-10-21 MED ORDER — LAMOTRIGINE 200 MG PO TABS: ORAL_TABLET | ORAL | 3 refills | Status: DC

## 2020-10-21 MED ORDER — PHENYTOIN SODIUM EXTENDED 30 MG PO CAPS: ORAL_CAPSULE | ORAL | 3 refills | Status: DC

## 2020-10-21 NOTE — Progress Notes (Signed)
NEUROLOGY FOLLOW UP OFFICE NOTE  Kathryn Lewis [id number] Aug 11, 1945  HISTORY OF PRESENT ILLNESS: I had the pleasure of seeing Kathryn Lewis in follow-up in the neurology clinic on 10/21/2020.  The patient was last seen over a year ago for seizures. She is again accompanied by her daughter Kathryn Lewis to provide additional information. She lives in [place]. Our office received a call from the [facility] in July 2021 that she had missed several days of her Dilantin in June and July with no seizures charted. We received a call from the patient on 03/2020 that she was biting her tongue in her sleep, Kathryn Lewis was called and said she was unsure if staff was giving her the Dilantin and was going to check. Kathryn Lewis reports staff has been giving medications, she is on Lamotrigine 200mg  1.5 tabs BID (300mg  BID) and Phenytoin 330mg  daily without side effects. There have been no report of seizures, Kathryn Lewis visits regularly and has not witnessed any staring/unresponsive episodes. No side effects on medications. She denies any headaches, dizziness, vision changes. Her main concern today, which was getting her quite agitated, is her dry tongue. She feels this is due to her heart medications. She refuses to drink water. Kathryn Lewis has provided her with different mouthwashes but finds the bottles full, Kathryn Lewis thinks she forgets to take them and will ask staff to administer. She states she cannot breathe. She is mostly in her wheelchair, but Kathryn Lewis has seen her walking. She says staff berates her when she uses her walker, Kathryn Lewis reports it is because she would take off so fast and tend to fall. No recent falls.    History on Initial Assessment 03/18/2017: This is a pleasant 75 year old ambidextrous right-hand dominant woman with a history of hypertension, diabetes, peripheral vascular disease, COPD, sleep apnea on CPAP, CAD, bipolar disorder, stroke in 2015 with subsequent seizures, presenting to establish care. She had previously been  seeing neurologist Dr. Chipper Herb in [place], [place], records unavailable for review. She and her daughter report that she had a stroke in 2015 which mostly affected her vision and memory. She cannot read. She can write her name and sometimes a word, but something would write a different word. The first seizure occurred in 2016, she recalls that everything got loud and mixed up, she tried calling her son but per daughter was "hollering out loud" then became unconscious. They report possibly only 2 seizures at that time, no seizures since 2016. She is taking Lamotrigine 200mg  1.5 tabs BID and Dilantin 330mg  qhs with no side effects. She had previously been living with her daughter, but due to her daughter medical issues, the patient opted to move to [facility] last year. She has had memory issues since the stroke, her daughter manages bills. Medications are administered at [facility]. She does not drive. She reports bilateral leg pain and deep pain radiating to her lower back. She also has a diagnosis of sciatica, "I think the left one." She has peripheral artery disease and sees Vascular.    She denies any olfactory/gustatory hallucinations, deja vu, rising epigastric sensation, focal numbness/tingling/weakness, myoclonic jerks. She denies any headaches, dizziness, diplopia, neck/back pain, bowel/bladder dysfunction.    Epilepsy Risk Factors:  History of large right posterior temporal and medial occipital infarct with encephalomalacia and gliosis. Otherwise she had a normal birth and early development.  There is no history of febrile convulsions, CNS infections such as meningitis/encephalitis, significant traumatic brain injury, neurosurgical procedures, or family history  of seizures. PAST MEDICAL HISTORY: Past Medical History:  Diagnosis Date   Allergy    Arthritis    Bipolar 1 disorder ([place])    Blood transfusion without reported diagnosis    Carotid artery stenosis    50-69% bilateral ICA stenoses  by velocity criteria but no visible plaque and ICA/CCA ratio 1.72 on right and 1.38 on left   Coronary artery disease    Depression    Diabetes mellitus without complication (HCC)    Echocardiogram    Echo 06/2018: Hyperdynamic systolic function, EF >19, mild concentric LVH, elevated LVEDP (E/E' suggests impaired relaxation), mild AI, lipomatous interatrial septum   Hypertension    Left adrenal mass (HCC)    2.5 cm L adrenal mass on CT per note of Dr. Seleta Rhymes 05/20/2014   MI, old    PAD (peripheral artery disease) ([place])    03/2014 - CT aortogram with lower extremity runoff (mild to moderate disease in common iliac arteries, complete occulusion of her bilateral external iliac arteries with heavily disease common femoral arteries. She also has disease to her SFAs bilaterally. Popliteal arteries and tibial vessel runoff appears to be adequate)   Seizures (HCC)    Stroke [facility])     MEDICATIONS: Current Outpatient Medications on File Prior to Visit  Medication Sig Dispense Refill   acetaminophen (TYLENOL) 500 MG tablet Take 500 mg by mouth 4 (four) times daily as needed.     albuterol (PROVENTIL HFA;VENTOLIN HFA) 108 (90 Base) MCG/ACT inhaler Inhale 2 puffs into the lungs every 6 (six) hours as needed for wheezing or shortness of breath. 1 Inhaler 6   amLODipine (NORVASC) 5 MG tablet Take 1 tablet (5 mg total) by mouth daily. 30 tablet 5   ASPIRIN LOW DOSE 81 MG EC tablet TAKE ONE TABLET BY MOUTH EVERY DAY 30 tablet 2   Cholecalciferol (VITAMIN D3 PO) Take 8,000 Units by mouth daily.      Cholecalciferol (VITAMIN D3) 50 MCG (2000 UT) TABS TAKE ONE TABLET BY MOUTH EVERY DAY 30 tablet 5   clopidogrel (PLAVIX) 75 MG tablet TAKE ONE TABLET BY MOUTH EVERY DAY 30 tablet 5   Dextran 70-Hypromellose (NATURAL BALANCE TEARS OP) Apply to eye. 1.4%, 2 drops each eye     DILANTIN 30 MG ER capsule Take 1 capsule (30 mg total) by mouth at bedtime. 90 capsule 3   ezetimibe (ZETIA) 10 MG tablet TAKE ONE TABLET BY  MOUTH EVERY DAY 30 tablet 5   FEROSUL 325 (65 Fe) MG tablet TAKE ONE TABLET BY MOUTH EVERY TWO DAYS with FOUR OUNCE of orange JUICE 100 tablet 0   fluticasone (FLONASE) 50 MCG/ACT nasal spray Place 2 sprays into both nostrils daily. 16 g 6   furosemide (LASIX) 20 MG tablet Take 1 tablet (20 mg total) by mouth daily as needed for edema. 30 tablet 3   Insulin Aspart FlexPen 100 UNIT/ML SOPN INJECT subcutaneously THREE TIMES DAILY PER sliding scales BLOOD SUGAR (150 NO INSULIN)+ 151-180 Give FOUR units. 181-220 Give SIX units, 221-260 Give EIGHT units, 261-300 Give 10 units 301-340 Give 12 units AND > 341 Give 14 units 15 mL 1   isosorbide mononitrate (IMDUR) 30 MG 24 hr tablet TAKE ONE TABLET BY MOUTH EVERY DAY 30 tablet 5   lamoTRIgine (LAMICTAL) 200 MG tablet TAKE 1.5 TABLETS BY MOUTH TWICE DAILY at 8 am AND 2pm 270 tablet 3   LANTUS SOLOSTAR 100 UNIT/ML Solostar Pen INJECT 20 units AT BEDTIME 15 mL 4   losartan (  COZAAR) 50 MG tablet TAKE ONE TABLET BY MOUTH EVERY DAY 30 tablet 5   metoprolol tartrate (LOPRESSOR) 50 MG tablet TAKE ONE TABLET BY MOUTH TWICE DAILY 60 tablet 5   Omega-3 Fatty Acids (FISH OIL) 1000 MG CAPS TAKE TWO CAPSULES BY MOUTH EVERY DAY 60 capsule 5   phenytoin (DILANTIN) 100 MG ER capsule Take 3 capsules (300 mg total) by mouth daily. 270 capsule 3   rosuvastatin (CRESTOR) 40 MG tablet TAKE ONE TABLET BY MOUTH EVERY DAY 30 tablet 5   Spacer/Aero Chamber Mouthpiece MISC 1 Device by Does not apply route daily as needed. 1 each 0   SPIRIVA HANDIHALER 18 MCG inhalation capsule place ONE capsule into inhaler AND inhale EVERY DAY 30 capsule 5   spironolactone (ALDACTONE) 50 MG tablet TAKE ONE TABLET BY MOUTH EVERY DAY 30 tablet 5   tiZANidine (ZANAFLEX) 4 MG tablet Take 0.5 tablets (2 mg total) by mouth every 12 (twelve) hours as needed for muscle spasms. 30 tablet 2   White Petrolatum-Mineral Oil (ARTIFICIAL TEARS) ointment Place into both eyes as needed.     No current  facility-administered medications on file prior to visit.    ALLERGIES: Allergies  Allergen Reactions   Influenza Vaccines Anaphylaxis   Amoxicillin Other (See Comments)    colitis   Augmentin [Amoxicillin-Pot Clavulanate] Other (See Comments)    colitis   Cefadroxil Other (See Comments)    Reported by [facility] 2013 - unknown reaction   Cephalexin Other (See Comments)    colitis   Erythromycin Other (See Comments)    colitis   Niacin And Related Other (See Comments)    colitis   Nitrofurantoin Macrocrystal Other (See Comments)    Reported by [facility] 2013 - unknown reaction    FAMILY HISTORY: Family History  Problem Relation Age of Onset   Hypertension Mother    Arthritis Mother    Diabetes Mother    Early death Mother    Hyperlipidemia Mother    Stroke Mother    Hypertension Father    Alcohol abuse Father    Early death Father    Hyperlipidemia Father    COPD Father    Heart attack Father    Hypertension Sister    Birth defects Brother    Depression Brother    Hyperlipidemia Brother    Hypertension Brother    Arthritis Daughter    Asthma Daughter    COPD Daughter    Arthritis Son    COPD Son    Hypertension Son    Hyperlipidemia Son    Heart attack Son    Arthritis Maternal Grandmother    Cancer Maternal Grandmother     SOCIAL HISTORY: Social History   Socioeconomic History   Marital status: Single    Spouse name: Not on file   Number of children: 2   Years of education: Not on file   Highest education level: Not on file  Occupational History   Not on file  Tobacco Use   Smoking status: Former    Packs/day: 1.00    Types: Cigarettes    Quit date: 2015    Years since quitting: 7.7   Smokeless tobacco: Never  Vaping Use   Vaping Use: Never used  Substance and Sexual Activity   Alcohol use: No   Drug use: No   Sexual activity: Not on file  Other Topics Concern   Not on file  Social History Narrative   Pt lives in assisted  living  facility   Has 2 adult children, she is widowed, husband died in his early 57's in a work related accident   [employer]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   Social Determinants of Radio broadcast assistant Strain: Low Risk    Difficulty of Paying Living Expenses: Not hard at all  Food Insecurity: No Food Insecurity   Worried About Charity fundraiser in the Last Year: Never true   Arboriculturist in the Last Year: Never true  Transportation Needs: No Transportation Needs   Lack of Transportation (Medical): No   Lack of Transportation (Non-Medical): No  Physical Activity: Inactive   Days of Exercise per Week: 0 days   Minutes of Exercise per Session: 0 min  Stress: No Stress Concern Present   Feeling of Stress : Not at all  Social Connections: Socially Isolated   Frequency of Communication with Friends and Family: More than three times a week   Frequency of Social Gatherings with Friends and Family: More than three times a week   Attends Religious Services: Never   Marine scientist or Organizations: No   Attends Archivist Meetings: Never   Marital Status: Widowed  Human resources officer Violence: Not At Risk   Fear of Current or Ex-Partner: No   Emotionally Abused: No   Physically Abused: No   Sexually Abused: No     PHYSICAL EXAM: Vitals:   10/21/20 1113  BP: (!) 145/66  Pulse: 74  Resp: 18  SpO2: (!) 9%   General: No acute distress, sitting in wheelchair. She is agitated about her dry tongue. Head:  Normocephalic/atraumatic Skin/Extremities: No rash, no edema Neurological Exam: alert and awake. No aphasia or dysarthria. Fund of knowledge is reduced.  Recent and remote memory are impaired.  Attention and concentration are reduced.   Cranial nerves: Pupils equal, round. Extraocular movements intact with no nystagmus. Visual fields full.  No facial asymmetry.  Motor: Bulk and tone normal, muscle strength 5/5 throughout with no pronator drift.   Finger to nose  testing intact.  Gait not tested   IMPRESSION: This is a 75 yo RH woman with a history of hypertension, diabetes, peripheral vascular disease, COPD, sleep apnea (refuses CPAP) CAD, bipolar disorder, stroke in 2015 with subsequent seizures. She has been seizure-free since 2016 on Phenytoin 330mg  daily and Lamotrigine 200mg  1.5 tabs BID (300mg  BID), refills sent. Her main concern today is dry tongue, discuss with PCP. Follow-up in 1 year, call for any changes.   Thank you for allowing me to participate in her care.  Please do not hesitate to call for any questions or concerns.    Ellouise Newer, M.D.   CC: Dr. Martinique

## 2020-10-21 NOTE — Patient Instructions (Signed)
Continue Dilantin 330mg  daily and Lamotrigine 200mg : take 1 and 1/2 tablets twice a day  2. Follow-up in 1 year, call for any changes

## 2020-10-22 DIAGNOSIS — M1990 Unspecified osteoarthritis, unspecified site: Secondary | ICD-10-CM | POA: Diagnosis not present

## 2020-10-22 DIAGNOSIS — R296 Repeated falls: Secondary | ICD-10-CM | POA: Diagnosis not present

## 2020-10-27 ENCOUNTER — Telehealth: Payer: Self-pay | Admitting: *Deleted

## 2020-10-27 NOTE — Chronic Care Management (AMB) (Signed)
  Chronic Care Management   Note  10/27/2020 Name: Kathryn Lewis MRN: [id number] DOB: 10-Jan-1946  Kathryn Lewis is a 75 y.o. year old female who is a primary care patient of [place], Malka So, MD. I reached out to Liliane Bade by phone today in response to a referral sent by [name]'s PCP.  [name] was given information about Chronic Care Management services today including:  CCM service includes personalized support from designated clinical staff supervised by her physician, including individualized plan of care and coordination with other care providers 24/7 contact phone numbers for assistance for urgent and routine care needs. Service will only be billed when office clinical staff spend 20 minutes or more in a month to coordinate care. Only one practitioner may furnish and bill the service in a calendar month. The patient may stop CCM services at any time (effective at the end of the month) by phone call to the office staff. The patient is responsible for co-pay (up to 20% after annual deductible is met) if co-pay is required by the individual health plan.   Daughter POA [name] verbally agreed to assistance and services provided by embedded care coordination/care management team today.  Follow up plan: Telephone appointment with care management team member scheduled for:10/30/20  Peter Management  Direct Dial: [phone]

## 2020-10-30 ENCOUNTER — Ambulatory Visit (INDEPENDENT_AMBULATORY_CARE_PROVIDER_SITE_OTHER): Payer: HMO

## 2020-10-30 DIAGNOSIS — E1169 Type 2 diabetes mellitus with other specified complication: Secondary | ICD-10-CM

## 2020-10-30 DIAGNOSIS — F0390 Unspecified dementia without behavioral disturbance: Secondary | ICD-10-CM

## 2020-10-30 DIAGNOSIS — J449 Chronic obstructive pulmonary disease, unspecified: Secondary | ICD-10-CM

## 2020-10-30 DIAGNOSIS — G40909 Epilepsy, unspecified, not intractable, without status epilepticus: Secondary | ICD-10-CM

## 2020-10-30 DIAGNOSIS — I119 Hypertensive heart disease without heart failure: Secondary | ICD-10-CM

## 2020-10-30 DIAGNOSIS — R296 Repeated falls: Secondary | ICD-10-CM

## 2020-10-30 DIAGNOSIS — E1149 Type 2 diabetes mellitus with other diabetic neurological complication: Secondary | ICD-10-CM

## 2020-10-30 NOTE — Patient Instructions (Addendum)
Visit Information   PATIENT GOALS:   Goals Addressed             This Visit's Progress    RNCM: Monitor and Manage My Blood Sugar-Diabetes Type 2       Timeframe:  Long-Range Goal Priority:  Medium Start Date:    10/30/20                         Expected End Date:    03/25/21                   Follow Up Date 12/23/20    - check blood sugar at prescribed times - check blood sugar if I feel it is too high or too low - take the blood sugar log to all doctor visits    Why is this important?   Checking your blood sugar at home helps to keep it from getting very high or very low.  Writing the results in a diary or log helps the doctor know how to care for you.  Your blood sugar log should have the time, date and the results.  Also, write down the amount of insulin or other medicine that you take.  Other information, like what you ate, exercise done and how you were feeling, will also be helpful.     Notes:      RNCM:Prevent Falls and Injury       Timeframe:  Long-Range Goal Priority:  High Start Date:    10/30/20                         Expected End Date:     03/25/21                  Follow Up Date 12/23/20    - always wear low-heeled or flat shoes or slippers with nonskid soles - join an exercise group in my community - use a cane or walker - use a nightlight in the bathroom    Why is this important?   Most falls happen when it is hard for you to walk safely. Your balance may be off because of an illness. You may have pain in your knees, hip or other joints.  You may be overly tired or taking medicines that make you sleepy. You may not be able to see or hear clearly.  Falls can lead to broken bones, bruises or other injuries.  There are things you can do to help prevent falling.     Notes:       It is important to avoid accidents which may result in broken bones.  Here are a few ideas on how to make your home safer so you will be less likely to trip or fall.  Use nonskid  mats or non slip strips in your shower or tub, on your bathroom floor and around sinks.  If you know that you have spilled water, wipe it up! In the bathroom, it is important to have properly installed grab bars on the walls or on the edge of the tub.  Towel racks are NOT strong enough for you to hold onto or to pull on for support. Stairs and hallways should have enough light.  Add lamps or night lights if you need ore light. It is good to have handrails on both sides of the stairs if possible.  Always fix broken handrails right away. It is  important to see the edges of steps.  Paint the edges of outdoor steps white so you can see them better.  Put colored tape on the edge of inside steps. Throw-rugs are dangerous because they can slide.  Removing the rugs is the best idea, but if they must stay, add adhesive carpet tape to prevent slipping. Do not keep things on stairs or in the halls.  Remove small furniture that blocks the halls as it may cause you to trip.  Keep telephone and electrical cords out of the way where you walk. Always were sturdy, rubber-soled shoes for good support.  Never wear just socks, especially on the stairs.  Socks may cause you to slip or fall.  Do not wear full-length housecoats as you can easily trip on the bottom.  Place the things you use the most on the shelves that are the easiest to reach.  If you use a stepstool, make sure it is in good condition.  If you feel unsteady, DO NOT climb, ask for help. If a health professional advises you to use a cane or walker, do not be ashamed.  These items can keep you from falling and breaking your bones.                                                    Type 2 Diabetes Mellitus, Self-Care, Adult When you have type 2 diabetes (type 2 diabetes mellitus), you must make sure your blood sugar (glucose) stays in a healthy range. You can do this with: Nutrition. Exercise. Lifestyle changes. Medicines or insulin, if needed. Support from  your doctors and others. What are the risks? Having type 2 diabetes can raise your risk for other long-term (chronic) health problems. You may get medicines to help prevent these problems. How to stay aware of your blood sugar  Check your blood sugar level every day, as often as told. Have your A1C (hemoglobin A1C) level checked two or more times a year. Have it checked more often if told. Your doctor will set personal treatment goals for you. In general, you should have these blood sugar levels: Before meals: 80-130 mg/dL (4.4-7.2 mmol/L). After meals: below 180 mg/dL (10 mmol/L). A1C: less than 7%. How to manage high and low blood sugar Symptoms of high blood sugar High blood sugar is also called hyperglycemia. Know the symptoms of high blood sugar. These may include: More thirst. Hunger. Feeling very tired. Needing to pee (urinate) more often than normal. Seeing things blurry. Symptoms of low blood sugar Low blood sugar is also called hypoglycemia. This is when blood sugar is at or below 70 mg/dL (3.9 mmol/L). Symptoms may include: Hunger. Feeling worried or nervous (anxious). Feeling sweaty and cold to the touch (clammy). Being dizzy or light-headed. Feeling sleepy. A fast heartbeat. Feeling grouchy (irritable). Tingling or loss of feeling (numbness) around your mouth, lips, or tongue. Restless sleep. Diabetes medicines can cause low blood sugar. You are more at risk: While you exercise. After exercise. During sleep. When you are sick. When you skip meals or do not eat for a long time. Treating low blood sugar If you think you have low blood sugar, eat or drink something sugary right away. Keep 15 grams of a fast-acting carb (carbohydrate) with you all the time. Make sure your family and friends know how to treat  you if you cannot treat yourself. Treating very low blood sugar Severe hypoglycemia is when your blood sugar is at or below 54 mg/dL (3 mmol/L). Severe  hypoglycemia is an emergency. Get medical help right away. Call your local emergency services (911 in the [place] Do not wait to see if the symptoms will go away. Do not drive yourself to the hospital. You may need a glucagon shot if you have very low blood sugar and you cannot eat or drink. Have a family member or friend learn how to check your blood sugar and how to give you a glucagon shot. Ask your doctor if you should have a kit for glucagon shots. Follow these instructions at home: Medicines Take prescribed insulin or diabetes medicines as told by your health care provider. Do not run out of insulin or other medicines. Plan ahead. If you use insulin, change the amount you take based on how active you are and what foods you eat. Your doctor will tell you how to do this. Take over-the-counter and prescription medicines only as told by your doctor. Eating and drinking  Eat healthy foods. These include: Low-fat (lean) proteins. Complex carbs, such as whole grains. Fresh fruits and vegetables. Low-fat dairy products. Healthy fats. Meet with a food expert (dietitian) to make an eating plan. Follow instructions from your doctor about what you cannot eat or drink. Drink enough fluid to keep your pee (urine) pale yellow. Keep track of carbs that you eat. Read food labels and learn serving sizes of foods. Follow your sick-day plan when you cannot eat or drink as normal. Make this plan with your doctor so it is ready to use. Activity Exercise as told by your doctor. You may need to: Do stretching and strength exercises two or more times a week. Do 150 minutes or more of exercise each week that makes your heart beat faster and makes you sweat. Spread out your exercise over 3 or more days a week. Do not go more than 2 days in a row without exercise. Talk with your doctor before you start a new exercise. Your doctor may tell you to change: How much insulin or medicines you take. How much food  you eat. Lifestyle Do not smoke or use any products that contain nicotine or tobacco. If you need help quitting, ask your doctor. If you drink alcohol and your doctor says that it is safe for you: Limit how much you have to: 0-1 drink a day for women who are not pregnant. 0-2 drinks a day for men. Know how much alcohol is in your drink. In the [place] one drink equals one 12 oz bottle of beer (355 mL), one 5 oz glass of wine (148 mL), or one 1 oz glass of hard liquor (44 mL). Learn to deal with stress. If you need help, ask your doctor. Body care  Stay up to date with your shots (immunizations). Have your eyes and feet checked by a doctor as often as told. Check your skin and feet every day. Check for cuts, bruises, redness, blisters, or sores. Brush your teeth and gums two times a day. Floss one or more times a day. Go to the dentist one or more times every 6 months. Stay at a healthy weight. General instructions Share your diabetes care plan with: Your work or school. People you live with. Carry a card or wear jewelry that says you have diabetes. Keep all follow-up visits. Questions to ask your doctor Do I need to  meet with a certified expert in diabetes education and care? Where can I find a support group? Where to find more information For help and guidance and more information about diabetes, please go to: American Diabetes Association: www.diabetes.org American Association of Diabetes Care and Education Specialists: www.diabeteseducator.org International Diabetes Federation: MemberVerification.ca Summary When you have type 2 diabetes, you must make sure your blood sugar (glucose) stays in a healthy range. You can do this with nutrition, exercise, medicines and insulin, and support from doctors and others. Check your blood sugar every day, or as often as told. Having diabetes can raise your risk for other long-term health problems. You may get medicines to help prevent these  problems. Share your diabetes management plan with people at work, school, and home. Keep all follow-up visits. This information is not intended to replace advice given to you by your health care provider. Make sure you discuss any questions you have with your health care provider. Document Revised: 04/07/2020 Document Reviewed: 04/07/2020 Elsevier Patient Education  2022 [employer].   Consent to CCM Services: Ms. Dales was given information about Chronic Care Management services including:  CCM service includes personalized support from designated clinical staff supervised by her physician, including individualized plan of care and coordination with other care providers 24/7 contact phone numbers for assistance for urgent and routine care needs. Service will only be billed when office clinical staff spend 20 minutes or more in a month to coordinate care. Only one practitioner may furnish and bill the service in a calendar month. The patient may stop CCM services at any time (effective at the end of the month) by phone call to the office staff. The patient will be responsible for cost sharing (co-pay) of up to 20% of the service fee (after annual deductible is met).  Patient agreed to services and verbal consent obtained.   Patient verbalizes understanding of instructions provided today and agrees to view in [place].   Telephone follow up appointment with care management team member scheduled for: 12/23/20 at 1 PM  Peter Garter RN, [facility], CDE Care Management Coordinator [facility] [phone], Mobile [phone]   CLINICAL CARE PLAN: Patient Care Plan: Diabetes Type 2 (Adult)     Problem Identified: Glycemic Management (Diabetes, Type 2)   Priority: Medium     Long-Range Goal: Glycemic Management Optimized   Start Date: 10/30/2020  Expected End Date: 03/25/2021  This Visit's Progress: On track  Priority: Medium  Note:   Objective:  Lab Results   Component Value Date   HGBA1C 6.2 07/14/2020   Lab Results  Component Value Date   CREATININE 0.94 07/14/2020   CREATININE 0.87 03/14/2019   CREATININE 0.60 09/20/2018   No results found for: EGFR Current Barriers:  Knowledge Deficits related to basic Diabetes pathophysiology and self care/management Cognitive Deficits Unable to independently self manage type 2 diabetes Unable to perform IADLs independently Spoke with daughter Inga Noller.  Pt lives at [facility] assisted living.  Facility assists pt with medications, CBG checks and IADLS.  States pt's appetite is good and she will eat sweets if offered to her at her residence.  States she get confused at times especially in the evening.  States her CBG are good but she does not know the numbers.  Denies any low or really high readings Case Manager Clinical Goal(s):  patient will demonstrate improved adherence to prescribed treatment plan for diabetes self care/management as evidenced by: daily monitoring and recording of CBG  adherence to  ADA/ carb modified diet adherence to prescribed medication regimen contacting provider for new or worsened symptoms or questions Interventions:  Collaboration with [place], Betty G, MD regarding development and update of comprehensive plan of care as evidenced by provider attestation and co-signature Inter-disciplinary care team collaboration (see longitudinal plan of care) Provided education to patient about basic DM disease process Reviewed medications with patient and discussed importance of medication adherence Discussed plans with patient for ongoing care management follow up and provided patient with direct contact information for care management team Reviewed scheduled/upcoming provider appointments including: Dr. Martinique 01/30/21 Advised patient, providing education and rationale, to check cbg daily and record, calling provider for findings outside established parameters.   Review of patient  status, including review of consultants reports, relevant laboratory and other test results, and medications completed. Self-Care Activities - UNABLE to independently self manage type 2 diabetes Attends all scheduled provider appointments Checks blood sugars as prescribed and utilize hyper and hypoglycemia protocol as needed Adheres to prescribed ADA/carb modified Patient Goals: - check blood sugar at prescribed times - check blood sugar if I feel it is too high or too low - enter blood sugar readings and medication or insulin into daily log - take the blood sugar log to all doctor visits - change to whole grain breads, cereal, pasta - drink 6 to 8 glasses of water each day - fill half of plate with vegetables - manage portion size - switch to sugar-free drinks - schedule appointment with eye doctor - check feet daily for cuts, sores or redness - keep feet up while sitting - trim toenails straight across - wash and dry feet carefully every day - wear comfortable, cotton socks - wear comfortable, well-fitting shoes Follow Up Plan: Telephone follow up appointment with care management team member scheduled for: 12/23/20 at 1 PM The patient has been provided with contact information for the care management team and has been advised to call with any health related questions or concerns.      Patient Care Plan: Stroke/Seizures     Problem Identified: Harm or Injury due to hx of stroke and seizures   Priority: High     Long-Range Goal: Harm or Injury Prevented due to hx of stroke and seizures   Start Date: 10/30/2020  Expected End Date: 03/25/2021  This Visit's Progress: On track  Priority: High  Note:   Current Barriers:  Knowledge Deficits related to fall precautions in patient with hx of stroke and seizures Decreased adherence to prescribed treatment for fall prevention Unable to independently self manage fall precautions with hx of stroke and seizures Unable to self administer  medications as prescribed Unable to perform IADLs independently Chronic Disease Management support and education needs related to self management of fall precautions with hx of stroke and seizures Cognitive Deficits Pt lives at [place] with daughter Taysia Rivere.  States pt has poor safety awareness and need to be reminded to not get up quickly and to use her walker.  Denies any recent falls or seizures Clinical Goal(s):  patient will demonstrate improved adherence to prescribed treatment plan for decreasing falls as evidenced by patient reporting and review of EMR patient will verbalize using fall risk reduction strategies discussed patient will not experience additional falls patient will verbalize understanding of plan for self management of fall precautions with hx of stroke and seizures patient will not experience hospital admission. Hospital Admissions in last 6 months = 0 patient will take all medications exactly  as prescribed and will call provider for medication related questions patient will attend all scheduled medical appointments: Dr. Martin[place] 01/30/21 patient will verbalize basic understanding of self management of fall precautions with hx of stroke and seizures disease process and self health management plan the patient will demonstrate ongoing self health care management ability Interventions:  Collaboration with [place], Betty G, MD regarding development and update of comprehensive plan of care as evidenced by provider attestation and co-signature Inter-disciplinary care team collaboration (see longitudinal plan of care) Provided written and verbal education re: Potential causes of falls and Fall prevention strategies Reviewed medications and discussed potential side effects of medications such as dizziness and frequent urination Assessed for s/s of orthostatic hypotension Assessed for falls since last encounter. Assessed patients knowledge of fall risk  prevention secondary to previously provided education Evaluation of current treatment plan related to self management of fall precautions with hx of stroke and seizures and patient's adherence to plan as established by provider. Advised patient, providing education and rationale, to monitor blood pressure daily and record, calling provider for findings outside established parameters.  Discussed plans with patient for ongoing care management follow up and provided patient with direct contact information for care management team Self-Care Deficits:  Unable to independently self management of fall precautions with hx of stroke and seizures Unable to self administer medications as prescribed Unable to perform IADLs independently Patient Goals:  - Utilize walker or wheelchair (assistive device) appropriately with all ambulation - De-clutter walkways - Change positions slowly - Wear secure fitting shoes at all times with ambulation - Utilize home lighting for dim lit areas - Demonstrate self and pet awareness at all times Follow Up Plan: Telephone follow up appointment with care management team member scheduled for: 12/23/20 at 1 PM The patient has been provided with contact information for the care management team and has been advised to call with any health related questions or concerns.

## 2020-10-30 NOTE — Chronic Care Management (AMB) (Signed)
Chronic Care Management   CCM RN Visit Note  10/30/2020 Name: Kathryn Lewis MRN: [id number] DOB: 1945/07/19  Subjective: Kathryn Lewis is a 75 y.o. year old female who is a primary care patient of Martinique, Malka So, MD. The care management team was consulted for assistance with disease management and care coordination needs.    Engaged with patient by telephone for initial visit in response to provider referral for case management and/or care coordination services.  Spoke with legal guardian daughter Jamyrah Saur Consent to Services:  The patient was given the following information about Chronic Care Management services today, agreed to services, and gave verbal consent: 1. CCM service includes personalized support from designated clinical staff supervised by the primary care provider, including individualized plan of care and coordination with other care providers 2. 24/7 contact phone numbers for assistance for urgent and routine care needs. 3. Service will only be billed when office clinical staff spend 20 minutes or more in a month to coordinate care. 4. Only one practitioner may furnish and bill the service in a calendar month. 5.The patient may stop CCM services at any time (effective at the end of the month) by phone call to the office staff. 6. The patient will be responsible for cost sharing (co-pay) of up to 20% of the service fee (after annual deductible is met). Patient agreed to services and consent obtained.  Patient agreed to services and verbal consent obtained.   Assessment: Review of patient past medical history, allergies, medications, health status, including review of consultants reports, laboratory and other test data, was performed as part of comprehensive evaluation and provision of chronic care management services.   SDOH (Social Determinants of Health) assessments and interventions performed:    CCM Care Plan  Allergies  Allergen Reactions   Influenza Vaccines  Anaphylaxis   Amoxicillin Other (See Comments)    colitis   Augmentin [Amoxicillin-Pot Clavulanate] Other (See Comments)    colitis   Cefadroxil Other (See Comments)    Reported by [facility] 2013 - unknown reaction   Cephalexin Other (See Comments)    colitis   Erythromycin Other (See Comments)    colitis   Niacin And Related Other (See Comments)    colitis   Nitrofurantoin Macrocrystal Other (See Comments)    Reported by [facility] 2013 - unknown reaction    Outpatient Encounter Medications as of 10/30/2020  Medication Sig   acetaminophen (TYLENOL) 500 MG tablet Take 500 mg by mouth 4 (four) times daily as needed.   albuterol (PROVENTIL HFA;VENTOLIN HFA) 108 (90 Base) MCG/ACT inhaler Inhale 2 puffs into the lungs every 6 (six) hours as needed for wheezing or shortness of breath.   amLODipine (NORVASC) 5 MG tablet Take 1 tablet (5 mg total) by mouth daily.   ASPIRIN LOW DOSE 81 MG EC tablet TAKE ONE TABLET BY MOUTH EVERY DAY   Cholecalciferol (VITAMIN D3 PO) Take 8,000 Units by mouth daily.    Cholecalciferol (VITAMIN D3) 50 MCG (2000 UT) TABS TAKE ONE TABLET BY MOUTH EVERY DAY   clopidogrel (PLAVIX) 75 MG tablet TAKE ONE TABLET BY MOUTH EVERY DAY   Dextran 70-Hypromellose (NATURAL BALANCE TEARS OP) Apply to eye. 1.4%, 2 drops each eye   ezetimibe (ZETIA) 10 MG tablet TAKE ONE TABLET BY MOUTH EVERY DAY   FEROSUL 325 (65 Fe) MG tablet TAKE ONE TABLET BY MOUTH EVERY TWO DAYS with FOUR OUNCE of orange JUICE   fluticasone (FLONASE) 50 MCG/ACT nasal spray Place 2  sprays into both nostrils daily.   furosemide (LASIX) 20 MG tablet Take 1 tablet (20 mg total) by mouth daily as needed for edema.   Insulin Aspart FlexPen 100 UNIT/ML SOPN INJECT subcutaneously THREE TIMES DAILY PER sliding scales BLOOD SUGAR (150 NO INSULIN)+ 151-180 Give FOUR units. 181-220 Give SIX units, 221-260 Give EIGHT units, 261-300 Give 10 units 301-340 Give 12 units AND > 341 Give 14 units   isosorbide  mononitrate (IMDUR) 30 MG 24 hr tablet TAKE ONE TABLET BY MOUTH EVERY DAY   lamoTRIgine (LAMICTAL) 200 MG tablet TAKE 1.5 TABLETS BY MOUTH TWICE DAILY   LANTUS SOLOSTAR 100 UNIT/ML Solostar Pen INJECT 20 units AT BEDTIME   losartan (COZAAR) 50 MG tablet TAKE ONE TABLET BY MOUTH EVERY DAY   metoprolol tartrate (LOPRESSOR) 50 MG tablet TAKE ONE TABLET BY MOUTH TWICE DAILY   Omega-3 Fatty Acids (FISH OIL) 1000 MG CAPS TAKE TWO CAPSULES BY MOUTH EVERY DAY   phenytoin (DILANTIN) 100 MG ER capsule Take 3 capsules (300 mg total) by mouth daily.   phenytoin (DILANTIN) 30 MG ER capsule Take 1 capsule (30 mg total) by mouth at bedtime.   rosuvastatin (CRESTOR) 40 MG tablet TAKE ONE TABLET BY MOUTH EVERY DAY   Spacer/Aero Chamber Mouthpiece MISC 1 Device by Does not apply route daily as needed.   SPIRIVA HANDIHALER 18 MCG inhalation capsule place ONE capsule into inhaler AND inhale EVERY DAY   spironolactone (ALDACTONE) 50 MG tablet TAKE ONE TABLET BY MOUTH EVERY DAY   tiZANidine (ZANAFLEX) 4 MG tablet Take 0.5 tablets (2 mg total) by mouth every 12 (twelve) hours as needed for muscle spasms.   White Petrolatum-Mineral Oil (ARTIFICIAL TEARS) ointment Place into both eyes as needed.   No facility-administered encounter medications on file as of 10/30/2020.    Patient Active Problem List   Diagnosis Date Noted   Dementia without behavioral disturbance ([place]) 01/23/2019   Peripheral polyneuropathy 10/24/2018   PAD (peripheral artery disease) ([place]) 04/05/2018   Carotid artery disease ([place]) 04/05/2018   Hypokalemia 02/27/2018   Frequent falls 07/08/2017   Back pain 07/08/2017   Mixed hyperlipidemia 05/27/2017   Unstable gait 01/10/2017   Hyperlipidemia associated with type 2 diabetes mellitus ([place]) 01/10/2017   OSA on CPAP 12/24/2016   COPD (chronic obstructive pulmonary disease) ([place]) 12/24/2016   Pulmonary nodule 12/24/2016   Generalized weakness 12/12/2015   Diabetes mellitus type 2 with  neurological manifestations ([place]) 12/12/2015   Hypertension with heart disease 12/12/2015   Seizure disorder as sequela of cerebrovascular accident ([place]) 04/17/2015   Atherosclerotic heart disease of native coronary artery with other forms of angina pectoris ([place]) 04/17/2015   Anemia, unspecified 03/02/2010   Cough 03/02/2010   Depressive disorder, not elsewhere classified 03/02/2010   Pneumonia, organism unspecified([place]) 03/02/2010   Shortness of breath 03/02/2010   Type II diabetes mellitus with neurological manifestations ([place]) 03/02/2010   Wheezing 03/02/2010   Chronic airway obstruction, not elsewhere classified 03/02/2010   Essential hypertension 03/02/2010   Acute, but ill-defined, cerebrovascular disease 03/02/2010    Conditions to be addressed/monitored:HTN, HLD, DMII, Dementia, and seizures,PAD hx of falls,   Care Plan : Diabetes Type 2 (Adult)  Updates made by Dimitri Ped, RN since 10/30/2020 12:00 AM     Problem: Glycemic Management (Diabetes, Type 2)   Priority: Medium     Long-Range Goal: Glycemic Management Optimized   Start Date: 10/30/2020  Expected End Date: 03/25/2021  This Visit's Progress: On track  Priority: Medium  Note:   Objective:  Lab Results  Component Value Date   HGBA1C 6.2 07/14/2020   Lab Results  Component Value Date   CREATININE 0.94 07/14/2020   CREATININE 0.87 03/14/2019   CREATININE 0.60 09/20/2018   No results found for: EGFR Current Barriers:  Knowledge Deficits related to basic Diabetes pathophysiology and self care/management Cognitive Deficits Unable to independently self manage type 2 diabetes Unable to perform IADLs independently Spoke with daughter Yumna Ebers.  Pt lives at [facility] assisted living.  Facility assists pt with medications, CBG checks and IADLS.  States pt's appetite is good and she will eat sweets if offered to her at her residence.  States she get confused at times especially in the evening.  States  her CBG are good but she does not know the numbers.  Denies any low or really high readings Case Manager Clinical Goal(s):  patient will demonstrate improved adherence to prescribed treatment plan for diabetes self care/management as evidenced by: daily monitoring and recording of CBG  adherence to ADA/ carb modified diet adherence to prescribed medication regimen contacting provider for new or worsened symptoms or questions Interventions:  Collaboration with Martin[place], Betty G, MD regarding development and update of comprehensive plan of care as evidenced by provider attestation and co-signature Inter-disciplinary care team collaboration (see longitudinal plan of care) Provided education to patient about basic DM disease process Reviewed medications with patient and discussed importance of medication adherence Discussed plans with patient for ongoing care management follow up and provided patient with direct contact information for care management team Reviewed scheduled/upcoming provider appointments including: Dr. Martinique 01/30/21 Advised patient, providing education and rationale, to check cbg daily and record, calling provider for findings outside established parameters.   Review of patient status, including review of consultants reports, relevant laboratory and other test results, and medications completed. Self-Care Activities - UNABLE to independently self manage type 2 diabetes Attends all scheduled provider appointments Checks blood sugars as prescribed and utilize hyper and hypoglycemia protocol as needed Adheres to prescribed ADA/carb modified Patient Goals: - check blood sugar at prescribed times - check blood sugar if I feel it is too high or too low - enter blood sugar readings and medication or insulin into daily log - take the blood sugar log to all doctor visits - change to whole grain breads, cereal, pasta - drink 6 to 8 glasses of water each day - fill half of plate with  vegetables - manage portion size - switch to sugar-free drinks - schedule appointment with eye doctor - check feet daily for cuts, sores or redness - keep feet up while sitting - trim toenails straight across - wash and dry feet carefully every day - wear comfortable, cotton socks - wear comfortable, well-fitting shoes Follow Up Plan: Telephone follow up appointment with care management team member scheduled for: 12/23/20 at 1 PM The patient has been provided with contact information for the care management team and has been advised to call with any health related questions or concerns.      Care Plan : Stroke/Seizures  Updates made by Dimitri Ped, RN since 10/30/2020 12:00 AM     Problem: Harm or Injury due to hx of stroke and seizures   Priority: High     Long-Range Goal: Harm or Injury Prevented due to hx of stroke and seizures   Start Date: 10/30/2020  Expected End Date: 03/25/2021  This Visit's Progress: On track  Priority: High  Note:   Current Barriers:  Knowledge Deficits related to fall precautions in patient with hx of stroke and seizures Decreased adherence to prescribed treatment for fall prevention Unable to independently self manage fall precautions with hx of stroke and seizures Unable to self administer medications as prescribed Unable to perform IADLs independently Chronic Disease Management support and education needs related to self management of fall precautions with hx of stroke and seizures Cognitive Deficits Pt lives at [facility] with daughter Kess Mcilwain.  States pt has poor safety awareness and need to be reminded to not get up quickly and to use her walker.  Denies any recent falls or seizures Clinical Goal(s):  patient will demonstrate improved adherence to prescribed treatment plan for decreasing falls as evidenced by patient reporting and review of EMR patient will verbalize using fall risk reduction strategies  discussed patient will not experience additional falls patient will verbalize understanding of plan for self management of fall precautions with hx of stroke and seizures patient will not experience hospital admission. Hospital Admissions in last 6 months = 0 patient will take all medications exactly as prescribed and will call provider for medication related questions patient will attend all scheduled medical appointments: Dr. Martinique 01/30/21 patient will verbalize basic understanding of self management of fall precautions with hx of stroke and seizures disease process and self health management plan the patient will demonstrate ongoing self health care management ability Interventions:  Collaboration with [place], Betty G, MD regarding development and update of comprehensive plan of care as evidenced by provider attestation and co-signature Inter-disciplinary care team collaboration (see longitudinal plan of care) Provided written and verbal education re: Potential causes of falls and Fall prevention strategies Reviewed medications and discussed potential side effects of medications such as dizziness and frequent urination Assessed for s/s of orthostatic hypotension Assessed for falls since last encounter. Assessed patients knowledge of fall risk prevention secondary to previously provided education Evaluation of current treatment plan related to self management of fall precautions with hx of stroke and seizures and patient's adherence to plan as established by provider. Advised patient, providing education and rationale, to monitor blood pressure daily and record, calling provider for findings outside established parameters.  Discussed plans with patient for ongoing care management follow up and provided patient with direct contact information for care management team Self-Care Deficits:  Unable to independently self management of fall precautions with hx of stroke and seizures Unable to self  administer medications as prescribed Unable to perform IADLs independently Patient Goals:  - Utilize walker or wheelchair (assistive device) appropriately with all ambulation - De-clutter walkways - Change positions slowly - Wear secure fitting shoes at all times with ambulation - Utilize home lighting for dim lit areas - Demonstrate self and pet awareness at all times Follow Up Plan: Telephone follow up appointment with care management team member scheduled for: 12/23/20 at 1 PM The patient has been provided with contact information for the care management team and has been advised to call with any health related questions or concerns.       Plan:Telephone follow up appointment with care management team member scheduled for:  12/23/20 and The patient has been provided with contact information for the care management team and has been advised to call with any health related questions or concerns.  Peter Garter RN, Jackquline Denmark, CDE Care Management Coordinator [facility] [phone], Mobile [phone]

## 2020-11-11 ENCOUNTER — Other Ambulatory Visit: Payer: Self-pay

## 2020-11-11 ENCOUNTER — Non-Acute Institutional Stay: Payer: HMO | Admitting: Hospice

## 2020-11-11 DIAGNOSIS — E1169 Type 2 diabetes mellitus with other specified complication: Secondary | ICD-10-CM | POA: Diagnosis not present

## 2020-11-11 DIAGNOSIS — Z515 Encounter for palliative care: Secondary | ICD-10-CM

## 2020-11-11 DIAGNOSIS — J449 Chronic obstructive pulmonary disease, unspecified: Secondary | ICD-10-CM

## 2020-11-11 DIAGNOSIS — E782 Mixed hyperlipidemia: Secondary | ICD-10-CM | POA: Diagnosis not present

## 2020-11-11 NOTE — Progress Notes (Signed)
[facility] Consult Note Telephone: [phone]  Fax: [phone]  PATIENT NAME: Kathryn Lewis DOB: 10/16/1945 MRN: [id number]  PRIMARY CARE PROVIDER:   Martinique, Betty G, MD Martinique, Betty G, MD [place],  [place] [address]  REFERRING PROVIDER: Martinique, Betty G, MD Martinique, Betty G, MD [place],  [place] [id number]  RESPONSIBLE PARTY:  [name] is the Legal guardian Contact Information     Name Relation Home Work Mobile   [place]  [place] Daughter   [phone]       Visit is to build trust and highlight Palliative Medicine as specialized medical care for people living with serious illness, aimed at facilitating better quality of life through symptoms relief, assisting with advance care planning and complex medical decision making. This is a follow up visit.  RECOMMENDATIONS/PLAN:   Advance Care Planning/Code Status: Patient is a Do Not Resuscitate.   Goals of Care: Goals of care include to maximize quality of life and symptom management.  Visit consisted of counseling and education dealing with the complex and emotionally intense issues of symptom management and palliative care in the setting of serious and potentially life-threatening illness. Palliative care team will continue to support patient, patient's family, and medical team.  Symptom management/Plan:  COPD: NO recent COPD exacerbation. Continue Spiriva daily. Patient has Albuterol on hand. Type 2 DM: Continue insulin. Lat A1c 6.2 07/14/2020.  Education reiterated on no concentrated sweets.  Repeat A1c every 3-6 months.   Follow up: Palliative care will continue to follow for complex medical decision making, advance care planning, and clarification of goals. Return 6 weeks or prn. Encouraged to call provider sooner with any concerns.  CHIEF COMPLAINT: Palliative follow up  HISTORY OF PRESENT ILLNESS:  [name] a 75 y.o. female  with multiple medical problems including COPD with no recent exacerbation, type 2 diabetes mellitus.  History of hypertension, hyperlipidemia, CAD status post CVA.  Patient denies respiratory distress, no pain/discomfort.  History obtained from review of EMR, discussion with primary team, family and/or patient. Records reviewed and summarized above. All 10 point systems reviewed and are negative except as documented in history of present illness above  Review and summarization of Epic records shows history from other than patient.   Palliative Care was asked to follow this patient o help address complex decision making in the context of advance care planning and goals of care clarification.   PHYSICAL EXAM  General: In no acute distress, appropriately dressed Cardiovascular: regular rate and rhythm; no edema in BLE Pulmonary: no cough, no increased work of breathing, normal respiratory effort Abdomen: soft, non tender, no guarding, positive bowel sounds in all quadrants GU:  no suprapubic tenderness Eyes: Normal lids, no discharge ENMT: Moist mucous membranes Musculoskeletal:  ambulatory with assistance device; uses wheelchair sometimes Skin: no rash to visible skin, warm without cyanosis,  Psych: non-anxious affect Neurological: Weakness but otherwise non focal Heme/lymph/immuno: no bruises, no bleeding  PERTINENT MEDICATIONS:  Outpatient Encounter Medications as of 11/11/2020  Medication Sig   acetaminophen (TYLENOL) 500 MG tablet Take 500 mg by mouth 4 (four) times daily as needed.   albuterol (PROVENTIL HFA;VENTOLIN HFA) 108 (90 Base) MCG/ACT inhaler Inhale 2 puffs into the lungs every 6 (six) hours as needed for wheezing or shortness of breath.   amLODipine (NORVASC) 5 MG tablet Take 1 tablet (5 mg total) by mouth daily.   ASPIRIN LOW DOSE 81 MG EC tablet TAKE ONE  TABLET BY MOUTH EVERY DAY   Cholecalciferol (VITAMIN D3 PO) Take 8,000 Units by mouth daily.    Cholecalciferol (VITAMIN  D3) 50 MCG (2000 UT) TABS TAKE ONE TABLET BY MOUTH EVERY DAY   clopidogrel (PLAVIX) 75 MG tablet TAKE ONE TABLET BY MOUTH EVERY DAY   Dextran 70-Hypromellose (NATURAL BALANCE TEARS OP) Apply to eye. 1.4%, 2 drops each eye   ezetimibe (ZETIA) 10 MG tablet TAKE ONE TABLET BY MOUTH EVERY DAY   FEROSUL 325 (65 Fe) MG tablet TAKE ONE TABLET BY MOUTH EVERY TWO DAYS with FOUR OUNCE of orange JUICE   fluticasone (FLONASE) 50 MCG/ACT nasal spray Place 2 sprays into both nostrils daily.   furosemide (LASIX) 20 MG tablet Take 1 tablet (20 mg total) by mouth daily as needed for edema.   Insulin Aspart FlexPen 100 UNIT/ML SOPN INJECT subcutaneously THREE TIMES DAILY PER sliding scales BLOOD SUGAR (150 NO INSULIN)+ 151-180 Give FOUR units. 181-220 Give SIX units, 221-260 Give EIGHT units, 261-300 Give 10 units 301-340 Give 12 units AND > 341 Give 14 units   isosorbide mononitrate (IMDUR) 30 MG 24 hr tablet TAKE ONE TABLET BY MOUTH EVERY DAY   lamoTRIgine (LAMICTAL) 200 MG tablet TAKE 1.5 TABLETS BY MOUTH TWICE DAILY   LANTUS SOLOSTAR 100 UNIT/ML Solostar Pen INJECT 20 units AT BEDTIME   losartan (COZAAR) 50 MG tablet TAKE ONE TABLET BY MOUTH EVERY DAY   metoprolol tartrate (LOPRESSOR) 50 MG tablet TAKE ONE TABLET BY MOUTH TWICE DAILY   Omega-3 Fatty Acids (FISH OIL) 1000 MG CAPS TAKE TWO CAPSULES BY MOUTH EVERY DAY   phenytoin (DILANTIN) 100 MG ER capsule Take 3 capsules (300 mg total) by mouth daily.   phenytoin (DILANTIN) 30 MG ER capsule Take 1 capsule (30 mg total) by mouth at bedtime.   rosuvastatin (CRESTOR) 40 MG tablet TAKE ONE TABLET BY MOUTH EVERY DAY   Spacer/Aero Chamber Mouthpiece MISC 1 Device by Does not apply route daily as needed.   SPIRIVA HANDIHALER 18 MCG inhalation capsule place ONE capsule into inhaler AND inhale EVERY DAY   spironolactone (ALDACTONE) 50 MG tablet TAKE ONE TABLET BY MOUTH EVERY DAY   tiZANidine (ZANAFLEX) 4 MG tablet Take 0.5 tablets (2 mg total) by mouth every 12  (twelve) hours as needed for muscle spasms.   White Petrolatum-Mineral Oil (ARTIFICIAL TEARS) ointment Place into both eyes as needed.   No facility-administered encounter medications on file as of 11/11/2020.    HOSPICE ELIGIBILITY/DIAGNOSIS: TBD  PAST MEDICAL HISTORY:  Past Medical History:  Diagnosis Date   Allergy    Arthritis    Bipolar 1 disorder ([place])    Blood transfusion without reported diagnosis    Carotid artery stenosis    50-69% bilateral ICA stenoses by velocity criteria but no visible plaque and ICA/CCA ratio 1.72 on right and 1.38 on left   Coronary artery disease    Depression    Diabetes mellitus without complication (HCC)    Echocardiogram    Echo 06/2018: Hyperdynamic systolic function, EF >50, mild concentric LVH, elevated LVEDP (E/E' suggests impaired relaxation), mild AI, lipomatous interatrial septum   Hypertension    Left adrenal mass (HCC)    2.5 cm L adrenal mass on CT per note of Dr. Seleta Rhymes 05/20/2014   MI, old    PAD (peripheral artery disease) ([place])    03/2014 - CT aortogram with lower extremity runoff (mild to moderate disease in common iliac arteries, complete occulusion of her bilateral external iliac arteries with  heavily disease common femoral arteries. She also has disease to her SFAs bilaterally. Popliteal arteries and tibial vessel runoff appears to be adequate)   Seizures (HCC)    Stroke ([place])      ALLERGIES:  Allergies  Allergen Reactions   Influenza Vaccines Anaphylaxis   Amoxicillin Other (See Comments)    colitis   Augmentin [Amoxicillin-Pot Clavulanate] Other (See Comments)    colitis   Cefadroxil Other (See Comments)    Reported by [facility] 2013 - unknown reaction   Cephalexin Other (See Comments)    colitis   Erythromycin Other (See Comments)    colitis   Niacin And Related Other (See Comments)    colitis   Nitrofurantoin Macrocrystal Other (See Comments)    Reported by [facility] 2013 - unknown reaction      I  spent 50 minutes providing this consultation; this includes time spent with patient/family, chart review and documentation. More than 50% of the time in this consultation was spent on counseling and coordinating communication   Thank you for the opportunity to participate in the care of [place] Please call our office at [phone] if we can be of additional assistance.  Note: Portions of this note were generated with [employer]. Dictation errors may occur despite best attempts at proofreading.  Teodoro Spray, NP

## 2020-11-15 ENCOUNTER — Other Ambulatory Visit: Payer: Self-pay | Admitting: Family Medicine

## 2020-11-19 ENCOUNTER — Telehealth: Payer: Self-pay | Admitting: Internal Medicine

## 2020-11-19 NOTE — Telephone Encounter (Signed)
Patient's daughter was calling to see if paperwork was completed. Please advise

## 2020-11-19 NOTE — Telephone Encounter (Signed)
Patients' daughter states that she had dropped off paperwork for renewal of handicap sticker and was calling to check on the status. Advised her that Dr. Harrington Challenger was not in the office today but she will be back tomorrow and we will check on it then.

## 2020-11-20 NOTE — Telephone Encounter (Signed)
[name] advised that we are unable to locate the Handicap Placard form that she dropped off but we have an application here and I will place it at the front for her to pick up.   Copy to be placed in the chart.

## 2020-11-21 DIAGNOSIS — M1990 Unspecified osteoarthritis, unspecified site: Secondary | ICD-10-CM | POA: Diagnosis not present

## 2020-11-21 DIAGNOSIS — R296 Repeated falls: Secondary | ICD-10-CM | POA: Diagnosis not present

## 2020-11-24 DIAGNOSIS — J449 Chronic obstructive pulmonary disease, unspecified: Secondary | ICD-10-CM | POA: Diagnosis not present

## 2020-11-24 DIAGNOSIS — E1149 Type 2 diabetes mellitus with other diabetic neurological complication: Secondary | ICD-10-CM

## 2020-11-24 DIAGNOSIS — E785 Hyperlipidemia, unspecified: Secondary | ICD-10-CM | POA: Diagnosis not present

## 2020-11-24 DIAGNOSIS — F0390 Unspecified dementia without behavioral disturbance: Secondary | ICD-10-CM | POA: Diagnosis not present

## 2020-11-24 DIAGNOSIS — I119 Hypertensive heart disease without heart failure: Secondary | ICD-10-CM | POA: Diagnosis not present

## 2020-11-24 DIAGNOSIS — E1169 Type 2 diabetes mellitus with other specified complication: Secondary | ICD-10-CM

## 2020-11-27 ENCOUNTER — Other Ambulatory Visit: Payer: Self-pay | Admitting: Family Medicine

## 2020-11-28 ENCOUNTER — Other Ambulatory Visit: Payer: Self-pay

## 2020-11-28 DIAGNOSIS — J441 Chronic obstructive pulmonary disease with (acute) exacerbation: Secondary | ICD-10-CM

## 2020-11-28 MED ORDER — FUROSEMIDE 20 MG PO TABS: 20.0000 mg | ORAL_TABLET | Freq: Every day | ORAL | 3 refills | Status: DC | PRN

## 2020-11-28 MED ORDER — ALBUTEROL SULFATE HFA 108 (90 BASE) MCG/ACT IN AERS: 2.0000 | INHALATION_SPRAY | Freq: Four times a day (QID) | RESPIRATORY_TRACT | 6 refills | Status: DC | PRN

## 2020-12-03 ENCOUNTER — Telehealth: Payer: Self-pay

## 2020-12-03 NOTE — Telephone Encounter (Signed)
Tanisha from the [facility] called stating patient will not take Omega-3 Fatty Acids ([place]) 1000 MG CAPS Med tech stated an order to D/c or change medication is needed  Call back # [phone] Fax # [phone]

## 2020-12-03 NOTE — Telephone Encounter (Signed)
Okay to d/c

## 2020-12-04 DIAGNOSIS — H04123 Dry eye syndrome of bilateral lacrimal glands: Secondary | ICD-10-CM | POA: Diagnosis not present

## 2020-12-05 NOTE — Telephone Encounter (Signed)
Discontinue order faxed to [facility].

## 2020-12-05 NOTE — Telephone Encounter (Signed)
Yes, It is ok to discontinue it. Thanks, BJ

## 2020-12-10 ENCOUNTER — Other Ambulatory Visit: Payer: Self-pay

## 2020-12-10 ENCOUNTER — Encounter (HOSPITAL_COMMUNITY): Payer: Self-pay

## 2020-12-10 ENCOUNTER — Emergency Department (HOSPITAL_COMMUNITY)
Admission: EM | Admit: 2020-12-10 | Discharge: 2020-12-10 | Disposition: A | Discharge status: Home / Self Care | Payer: HMO | Attending: Emergency Medicine | Admitting: Emergency Medicine

## 2020-12-10 DIAGNOSIS — E1142 Type 2 diabetes mellitus with diabetic polyneuropathy: Secondary | ICD-10-CM | POA: Insufficient documentation

## 2020-12-10 DIAGNOSIS — Z7902 Long term (current) use of antithrombotics/antiplatelets: Secondary | ICD-10-CM | POA: Insufficient documentation

## 2020-12-10 DIAGNOSIS — R030 Elevated blood-pressure reading, without diagnosis of hypertension: Secondary | ICD-10-CM

## 2020-12-10 DIAGNOSIS — Z79899 Other long term (current) drug therapy: Secondary | ICD-10-CM | POA: Diagnosis not present

## 2020-12-10 DIAGNOSIS — R040 Epistaxis: Secondary | ICD-10-CM | POA: Insufficient documentation

## 2020-12-10 DIAGNOSIS — I25118 Atherosclerotic heart disease of native coronary artery with other forms of angina pectoris: Secondary | ICD-10-CM | POA: Diagnosis not present

## 2020-12-10 DIAGNOSIS — Z87891 Personal history of nicotine dependence: Secondary | ICD-10-CM | POA: Diagnosis not present

## 2020-12-10 DIAGNOSIS — J449 Chronic obstructive pulmonary disease, unspecified: Secondary | ICD-10-CM | POA: Insufficient documentation

## 2020-12-10 DIAGNOSIS — R58 Hemorrhage, not elsewhere classified: Secondary | ICD-10-CM | POA: Diagnosis not present

## 2020-12-10 DIAGNOSIS — Z7951 Long term (current) use of inhaled steroids: Secondary | ICD-10-CM | POA: Insufficient documentation

## 2020-12-10 DIAGNOSIS — Z7982 Long term (current) use of aspirin: Secondary | ICD-10-CM | POA: Diagnosis not present

## 2020-12-10 DIAGNOSIS — I10 Essential (primary) hypertension: Secondary | ICD-10-CM | POA: Insufficient documentation

## 2020-12-10 DIAGNOSIS — F0390 Unspecified dementia without behavioral disturbance: Secondary | ICD-10-CM | POA: Insufficient documentation

## 2020-12-10 DIAGNOSIS — Z794 Long term (current) use of insulin: Secondary | ICD-10-CM | POA: Diagnosis not present

## 2020-12-10 LAB — CBC WITH DIFFERENTIAL/PLATELET
Abs Immature Granulocytes: 0.09 10*3/uL — ABNORMAL HIGH (ref 0.00–0.07)
Basophils Absolute: 0 10*3/uL (ref 0.0–0.1)
Basophils Relative: 1 %
Eosinophils Absolute: 0.1 10*3/uL (ref 0.0–0.5)
Eosinophils Relative: 2 %
HCT: 35.3 % — ABNORMAL LOW (ref 36.0–46.0)
Hemoglobin: 11.5 g/dL — ABNORMAL LOW (ref 12.0–15.0)
Immature Granulocytes: 1 %
Lymphocytes Relative: 14 %
Lymphs Abs: 1.2 10*3/uL (ref 0.7–4.0)
MCH: 29.9 pg (ref 26.0–34.0)
MCHC: 32.6 g/dL (ref 30.0–36.0)
MCV: 91.9 fL (ref 80.0–100.0)
Monocytes Absolute: 0.6 10*3/uL (ref 0.1–1.0)
Monocytes Relative: 7 %
Neutro Abs: 6.4 10*3/uL (ref 1.7–7.7)
Neutrophils Relative %: 75 %
Platelets: 211 10*3/uL (ref 150–400)
RBC: 3.84 MIL/uL — ABNORMAL LOW (ref 3.87–5.11)
RDW: 14.1 % (ref 11.5–15.5)
WBC: 8.5 10*3/uL (ref 4.0–10.5)
nRBC: 0 % (ref 0.0–0.2)

## 2020-12-10 LAB — BASIC METABOLIC PANEL
Anion gap: 8 (ref 5–15)
BUN: 18 mg/dL (ref 8–23)
CO2: 22 mmol/L (ref 22–32)
Calcium: 9.3 mg/dL (ref 8.9–10.3)
Chloride: 100 mmol/L (ref 98–111)
Creatinine, Ser: 1.15 mg/dL — ABNORMAL HIGH (ref 0.44–1.00)
GFR, Estimated: 50 mL/min — ABNORMAL LOW (ref >60)
Glucose, Bld: 133 mg/dL — ABNORMAL HIGH (ref 70–99)
Potassium: 4.1 mmol/L (ref 3.5–5.1)
Sodium: 130 mmol/L — ABNORMAL LOW (ref 135–145)

## 2020-12-10 LAB — CBG MONITORING, ED: Glucose-Capillary: 162 mg/dL — ABNORMAL HIGH (ref 70–99)

## 2020-12-10 MED ORDER — LOSARTAN POTASSIUM 50 MG PO TABS
50.0000 mg | ORAL_TABLET | Freq: Once | ORAL | Status: AC
  Administered 2020-12-10: 50 mg via ORAL
  Filled 2020-12-10: qty 1

## 2020-12-10 MED ORDER — AMLODIPINE BESYLATE 5 MG PO TABS
5.0000 mg | ORAL_TABLET | Freq: Once | ORAL | Status: AC
  Administered 2020-12-10: 5 mg via ORAL
  Filled 2020-12-10: qty 1

## 2020-12-10 NOTE — ED Notes (Signed)
Pt given sandwich bag and diet soda per request. No acute changes noted. Pt ambulatory to the bathroom and back to room.

## 2020-12-10 NOTE — ED Provider Notes (Signed)
[facility] EMERGENCY DEPARTMENT Provider Note  CSN: [id number] Arrival date & time: 12/10/20 0156  Chief Complaint(s) Epistaxis  HPI Kathryn Lewis is a 75 y.o. female    Epistaxis, Left Started tonight, Just PTA. Lasted < 1 hr. Resolved with ice in a washcloth.  No trauma No recent fevers or infection.  Noted to be hypertensive with SBP>210 No Afrin use. No AC. No recent med change.  Past Medical History Past Medical History:  Diagnosis Date   Allergy    Arthritis    Bipolar 1 disorder ([place])    Blood transfusion without reported diagnosis    Carotid artery stenosis    50-69% bilateral ICA stenoses by velocity criteria but no visible plaque and ICA/CCA ratio 1.72 on right and 1.38 on left   Coronary artery disease    Depression    Diabetes mellitus without complication (HCC)    Echocardiogram    Echo 06/2018: Hyperdynamic systolic function, EF >32, mild concentric LVH, elevated LVEDP (E/E' suggests impaired relaxation), mild AI, lipomatous interatrial septum   Hypertension    Left adrenal mass (HCC)    2.5 cm L adrenal mass on CT per note of Dr. Seleta Rhymes 05/20/2014   MI, old    PAD (peripheral artery disease) ([place])    03/2014 - CT aortogram with lower extremity runoff (mild to moderate disease in common iliac arteries, complete occulusion of her bilateral external iliac arteries with heavily disease common femoral arteries. She also has disease to her SFAs bilaterally. Popliteal arteries and tibial vessel runoff appears to be adequate)   Seizures ([place])    Stroke [facility])    Patient Active Problem List   Diagnosis Date Noted   Dementia without behavioral disturbance ([place]) 01/23/2019   Peripheral polyneuropathy 10/24/2018   PAD (peripheral artery disease) ([place]) 04/05/2018   Carotid artery disease ([place]) 04/05/2018   Hypokalemia 02/27/2018   Frequent falls 07/08/2017   Back pain 07/08/2017   Mixed hyperlipidemia 05/27/2017   Unstable gait 01/10/2017    Hyperlipidemia associated with type 2 diabetes mellitus ([place]) 01/10/2017   OSA on CPAP 12/24/2016   COPD (chronic obstructive pulmonary disease) ([place]) 12/24/2016   Pulmonary nodule 12/24/2016   Generalized weakness 12/12/2015   Diabetes mellitus type 2 with neurological manifestations ([place]) 12/12/2015   Hypertension with heart disease 12/12/2015   Seizure disorder as sequela of cerebrovascular accident ([place]) 04/17/2015   Atherosclerotic heart disease of native coronary artery with other forms of angina pectoris ([place]) 04/17/2015   Anemia, unspecified 03/02/2010   Cough 03/02/2010   Depressive disorder, not elsewhere classified 03/02/2010   Pneumonia, organism unspecified([place]) 03/02/2010   Shortness of breath 03/02/2010   Type II diabetes mellitus with neurological manifestations ([place]) 03/02/2010   Wheezing 03/02/2010   Chronic airway obstruction, not elsewhere classified 03/02/2010   Essential hypertension 03/02/2010   Acute, but ill-defined, cerebrovascular disease 03/02/2010   Home Medication(s) Prior to Admission medications   Medication Sig Start Date End Date Taking? Authorizing Provider  acetaminophen (TYLENOL) 500 MG tablet Take 500 mg by mouth 4 (four) times daily as needed for headache.   Yes [provider]  albuterol (VENTOLIN HFA) 108 (90 Base) MCG/ACT inhaler Inhale 2 puffs into the lungs every 6 (six) hours as needed for wheezing or shortness of breath. 11/28/20  Yes Martinique, Betty G, MD  amLODipine (NORVASC) 5 MG tablet Take 1 tablet (5 mg total) by mouth daily. 09/10/20  Yes [place]ique, Betty G, MD  ARTIFICIAL TEARS 1.4 % ophthalmic solution USE  AS NEEDED FOR DRY EYE Patient taking differently: Place 1 drop into both eyes as needed for dry eyes. 11/28/20  Yes [place]ique, Betty G, MD  ASPIRIN LOW DOSE 81 MG EC tablet TAKE ONE TABLET BY MOUTH EVERY DAY Patient taking differently: Take 81 mg by mouth daily. 10/08/20  Yes [place]ique, Betty G, MD  Cholecalciferol (VITAMIN D3) 50 MCG  (2000 UT) TABS TAKE ONE TABLET BY MOUTH EVERY DAY Patient taking differently: Take 2,000 Units by mouth daily. 09/05/20  Yes [place]ique, Betty G, MD  clopidogrel (PLAVIX) 75 MG tablet TAKE ONE TABLET BY MOUTH EVERY DAY Patient taking differently: Take 75 mg by mouth daily. 09/10/20  Yes [place]ique, Betty G, MD  ezetimibe (ZETIA) 10 MG tablet TAKE ONE TABLET BY MOUTH EVERY DAY Patient taking differently: Take 10 mg by mouth daily. 09/05/20  Yes [place]ique, Betty G, MD  FEROSUL 325 (65 Fe) MG tablet TAKE ONE TABLET BY MOUTH EVERY TWO DAYS with FOUR OUNCE of orange JUICE Patient taking differently: Take 325 mg by mouth See admin instructions. Every 2 days 10/14/20  Yes [place]ique, Betty G, MD  furosemide (LASIX) 20 MG tablet Take 1 tablet (20 mg total) by mouth daily as needed for edema. 11/28/20  Yes [place], Betty G, MD  Insulin Aspart FlexPen 100 UNIT/ML SOPN INJECT subcutaneously THREE TIMES DAILY PER sliding scales BLOOD SUGAR (150 NO INSULIN)+ 151-180 Give FOUR units. 181-220 Give SIX units, 221-260 Give EIGHT units, 261-300 Give 10 units 301-340 Give 12 units AND > 341 Give 14 units Patient taking differently: Inject 0-14 Units into the skin See admin instructions. INJECT subcutaneously THREE TIMES DAILY PER sliding scales BLOOD SUGAR (150 NO INSULIN). 151-180 = 4 units. 181-220 = 6 units, 221-260 = 8 units, 261-300 = 10 units 301-340 = 12 units AND > 341 = 14 units 11/26/19  Yes [place], Betty G, MD  isosorbide mononitrate (IMDUR) 30 MG 24 hr tablet TAKE ONE TABLET BY MOUTH EVERY DAY Patient taking differently: Take 30 mg by mouth daily. 09/26/20  Yes [place], Betty G, MD  lamoTRIgine (LAMICTAL) 200 MG tablet TAKE 1.5 TABLETS BY MOUTH TWICE DAILY Patient taking differently: Take 300 mg by mouth 2 (two) times daily. 10/21/20  Yes Cameron Sprang, MD  LANTUS SOLOSTAR 100 UNIT/ML Solostar Pen INJECT 20 units AT BEDTIME Patient taking differently: Inject 20 Units into the skin at bedtime. 09/03/20  Yes [place]ique, Betty G, MD   losartan (COZAAR) 50 MG tablet TAKE ONE TABLET BY MOUTH EVERY DAY Patient taking differently: Take 50 mg by mouth daily. 09/10/20  Yes [place], Betty G, MD  metoprolol tartrate (LOPRESSOR) 50 MG tablet TAKE ONE TABLET BY MOUTH TWICE DAILY Patient taking differently: Take 50 mg by mouth 2 (two) times daily. 11/17/20  Yes [place], Betty G, MD  phenytoin (DILANTIN) 100 MG ER capsule Take 3 capsules (300 mg total) by mouth daily. Patient taking differently: Take 300 mg by mouth at bedtime. 10/21/20  Yes Cameron Sprang, MD  phenytoin (DILANTIN) 30 MG ER capsule Take 1 capsule (30 mg total) by mouth at bedtime. 10/21/20  Yes Cameron Sprang, MD  rosuvastatin (CRESTOR) 40 MG tablet TAKE ONE TABLET BY MOUTH EVERY DAY Patient taking differently: Take 40 mg by mouth daily. 07/21/20  Yes [place], Betty G, MD  SPIRIVA HANDIHALER 18 MCG inhalation capsule place ONE capsule into inhaler AND inhale EVERY DAY Patient taking differently: Place 18 mcg into inhaler and inhale daily. 09/05/20  Yes [place], Betty G, MD  spironolactone (ALDACTONE)  50 MG tablet TAKE ONE TABLET BY MOUTH EVERY DAY Patient taking differently: Take 50 mg by mouth daily. 09/05/20  Yes [place], Betty G, MD  tiZANidine (ZANAFLEX) 4 MG tablet Take 0.5 tablets (2 mg total) by mouth every 12 (twelve) hours as needed for muscle spasms. 01/30/19  Yes [place], Betty G, MD  Cholecalciferol (VITAMIN D3 PO) Take 8,000 Units by mouth daily.  Patient not taking: Reported on 12/10/2020    [provider]  fluticasone (FLONASE) 50 MCG/ACT nasal spray Place 2 sprays into both nostrils daily. Patient not taking: Reported on 12/10/2020 04/22/17   Dorothyann Peng, NP  Omega-3 Fatty Acids ([place]) 1000 MG CAPS TAKE TWO CAPSULES BY MOUTH EVERY DAY Patient not taking: Reported on 12/10/2020 09/26/20   [place], Betty G, MD  Spacer/Aero Chamber Mouthpiece MISC 1 Device by Does not apply route daily as needed. 12/24/16   Rigoberto Noel, MD                                                                                                                                     Past Surgical History Past Surgical History:  Procedure Laterality Date   BREAST SURGERY  2012   small lump nonmilignant   NECK SURGERY     Family History Family History  Problem Relation Age of Onset   Hypertension Mother    Arthritis Mother    Diabetes Mother    Early death Mother    Hyperlipidemia Mother    Stroke Mother    Hypertension Father    Alcohol abuse Father    Early death Father    Hyperlipidemia Father    COPD Father    Heart attack Father    Hypertension Sister    Birth defects Brother    Depression Brother    Hyperlipidemia Brother    Hypertension Brother    Arthritis Daughter    Asthma Daughter    COPD Daughter    Arthritis Son    COPD Son    Hypertension Son    Hyperlipidemia Son    Heart attack Son    Arthritis Maternal Grandmother    Cancer Maternal Grandmother     Social History Social History   Tobacco Use   Smoking status: Former    Packs/day: 1.00    Types: Cigarettes    Quit date: 2015    Years since quitting: 7.8   Smokeless tobacco: Never  Vaping Use   Vaping Use: Never used  Substance Use Topics   Alcohol use: No   Drug use: No   Allergies Influenza vaccines, Amoxicillin, Augmentin [amoxicillin-pot clavulanate], Cefadroxil, Cephalexin, Erythromycin, Niacin and related, and Nitrofurantoin macrocrystal  Review of Systems Review of Systems  HENT:  Positive for nosebleeds.   All other systems are reviewed and are negative for acute change except as noted in the HPI  Physical Exam Vital Signs  I have reviewed the triage vital  signs BP (!) 172/48   Pulse 70   Temp 98.8 F (37.1 C) (Oral)   Resp 14   Ht (P) 5' 0.9" (1.547 m)   LMP 03/17/2015   SpO2 96%   BMI (P) 25.21 kg/m   Physical Exam Vitals reviewed.  Constitutional:      General: She is not in acute distress.    Appearance: She is well-developed. She is not  diaphoretic.  HENT:     Head: Normocephalic and atraumatic.     Nose: Nose normal.     Comments: Dried blood in left nare No active bleeding Eyes:     General: No scleral icterus.       Right eye: No discharge.        Left eye: No discharge.     Conjunctiva/sclera: Conjunctivae normal.     Pupils: Pupils are equal, round, and reactive to light.  Cardiovascular:     Rate and Rhythm: Normal rate and regular rhythm.     Heart sounds: No murmur heard.   No friction rub. No gallop.  Pulmonary:     Effort: Pulmonary effort is normal. No respiratory distress.     Breath sounds: Normal breath sounds. No stridor. No rales.  Abdominal:     General: There is no distension.     Palpations: Abdomen is soft.     Tenderness: There is no abdominal tenderness.  Musculoskeletal:        General: No tenderness.     Cervical back: Normal range of motion and neck supple.  Skin:    General: Skin is warm and dry.     Findings: No erythema or rash.  Neurological:     Mental Status: She is alert and oriented to person, place, and time.    ED Results and Treatments Labs (all labs ordered are listed, but only abnormal results are displayed) Labs Reviewed  CBC WITH DIFFERENTIAL/PLATELET - Abnormal; Notable for the following components:      Result Value   RBC 3.84 (*)    Hemoglobin 11.5 (*)    HCT 35.3 (*)    Abs Immature Granulocytes 0.09 (*)    All other components within normal limits  BASIC METABOLIC PANEL - Abnormal; Notable for the following components:   Sodium 130 (*)    Glucose, Bld 133 (*)    Creatinine, Ser 1.15 (*)    GFR, Estimated 50 (*)    All other components within normal limits  CBG MONITORING, ED - Abnormal; Notable for the following components:   Glucose-Capillary 162 (*)    All other components within normal limits                                                                                                                         EKG  EKG  Interpretation  Date/Time:    Ventricular Rate:    PR Interval:    QRS Duration:   QT Interval:    QTC Calculation:  R Axis:     Text Interpretation:         Radiology No results found.  Pertinent labs & imaging results that were available during my care of the patient were reviewed by me and considered in my medical decision making (see MDM for details).  Medications Ordered in ED Medications  losartan (COZAAR) tablet 50 mg (50 mg Oral Given 12/10/20 0342)  amLODipine (NORVASC) tablet 5 mg (5 mg Oral Given 12/10/20 0342)                                                                                                                                     Procedures Procedures  (including critical care time)  Medical Decision Making / ED Course I have reviewed the nursing notes for this encounter and the patient's prior records (if available in EHR or on provided paperwork).  Kathryn Lewis was evaluated in Emergency Department on 12/10/2020 for the symptoms described in the history of present illness. She was evaluated in the context of the global COVID-19 pandemic, which necessitated consideration that the patient might be at risk for infection with the SARS-CoV-2 virus that causes COVID-19. Institutional protocols and algorithms that pertain to the evaluation of patients at risk for COVID-19 are in a state of rapid change based on information released by regulatory bodies including the CDC and federal and state organizations. These policies and algorithms were followed during the patient's care in the ED.     Resolved left nare epistaxis. No AC No trauma No imaging needed. Labs reassuring. Monitored for several hour w/o recurrence. BP improved with home meds.  Pertinent labs & imaging results that were available during my care of the patient were reviewed by me and considered in my medical decision making:  Son at bedside.  Final Clinical Impression(s) / ED  Diagnoses Final diagnoses:  Epistaxis  Elevated blood pressure reading   The patient appears reasonably screened and/or stabilized for discharge and I doubt any other medical condition or other [facility] requiring further screening, evaluation, or treatment in the ED at this time prior to discharge. Safe for discharge with strict return precautions.  Disposition: Discharge  Condition: Good  I have discussed the results, Dx and Tx plan with the patient/family who expressed understanding and agree(s) with the plan. Discharge instructions discussed at length. The patient/family was given strict return precautions who verbalized understanding of the instructions. No further questions at time of discharge.    ED Discharge Orders     None        Follow Up: [place], Betty G, [place] [id number] [phone]  Call  to schedule an appointment for close follow up     This chart was dictated using voice recognition software.  Despite best efforts to proofread,  errors can occur which can change the documentation meaning.    Fatima Blank, MD 12/10/20 [phone]

## 2020-12-10 NOTE — ED Notes (Signed)
ED Provider at bedside. 

## 2020-12-10 NOTE — ED Notes (Signed)
Family updated as to patient's status.

## 2020-12-10 NOTE — ED Triage Notes (Signed)
Pt presents to the ED from [facility] by [facility] with complaints of left nare bleeding onset tonight. Pt on Plavix and ASA and has not had any changes to her medications recently. Bleeding controlled when EMS arrived but pt hypertensive 232/122.

## 2020-12-10 NOTE — ED Notes (Signed)
Daughter, [name] called and states that the patient has memory issues and can't read due to the stroke, She states that her or her brother will be up to sit with her within the next several hours.

## 2020-12-12 ENCOUNTER — Other Ambulatory Visit: Payer: Self-pay | Admitting: Family Medicine

## 2020-12-15 ENCOUNTER — Telehealth: Payer: Self-pay | Admitting: Family Medicine

## 2020-12-15 NOTE — Telephone Encounter (Signed)
Spoke with daughter to schedule Medicare Annual Wellness Visit (AWV) either virtually or in office.   She stated pt insurance is coming to home 12/22/20  she also stated pt has palliative care  Last AWV 11/27/19 please schedule at anytime with [facility] Nurse Health Advisor 1 or 2   This should be a 45 minute visit.

## 2020-12-22 DIAGNOSIS — M1990 Unspecified osteoarthritis, unspecified site: Secondary | ICD-10-CM | POA: Diagnosis not present

## 2020-12-22 DIAGNOSIS — R296 Repeated falls: Secondary | ICD-10-CM | POA: Diagnosis not present

## 2020-12-23 ENCOUNTER — Ambulatory Visit (INDEPENDENT_AMBULATORY_CARE_PROVIDER_SITE_OTHER): Payer: HMO

## 2020-12-23 DIAGNOSIS — F0390 Unspecified dementia without behavioral disturbance: Secondary | ICD-10-CM

## 2020-12-23 DIAGNOSIS — R296 Repeated falls: Secondary | ICD-10-CM

## 2020-12-23 DIAGNOSIS — G40909 Epilepsy, unspecified, not intractable, without status epilepticus: Secondary | ICD-10-CM

## 2020-12-23 DIAGNOSIS — I119 Hypertensive heart disease without heart failure: Secondary | ICD-10-CM

## 2020-12-23 DIAGNOSIS — J449 Chronic obstructive pulmonary disease, unspecified: Secondary | ICD-10-CM

## 2020-12-23 DIAGNOSIS — E1169 Type 2 diabetes mellitus with other specified complication: Secondary | ICD-10-CM

## 2020-12-23 DIAGNOSIS — E1149 Type 2 diabetes mellitus with other diabetic neurological complication: Secondary | ICD-10-CM

## 2020-12-23 NOTE — Chronic Care Management (AMB) (Signed)
Chronic Care Management   CCM RN Visit Note  12/23/2020 Name: Kathryn Lewis MRN: [id number] DOB: 1945/10/25  Subjective: Kathryn Lewis is a 75 y.o. year old female who is a primary care patient of Martinique, Malka So, MD. The care management team was consulted for assistance with disease management and care coordination needs.    Engaged with patient by telephone for follow up visit in response to provider referral for case management and/or care coordination services.   Consent to Services:  The patient was given information about Chronic Care Management services, agreed to services, and gave verbal consent prior to initiation of services.  Please see initial visit note for detailed documentation.   Patient agreed to services and verbal consent obtained.   Assessment: Review of patient past medical history, allergies, medications, health status, including review of consultants reports, laboratory and other test data, was performed as part of comprehensive evaluation and provision of chronic care management services.   SDOH (Social Determinants of Health) assessments and interventions performed:    CCM Care Plan  Allergies  Allergen Reactions   Influenza Vaccines Anaphylaxis   Amoxicillin Other (See Comments)    colitis   Augmentin [Amoxicillin-Pot Clavulanate] Other (See Comments)    colitis   Cefadroxil Other (See Comments)    Reported by [facility] 2013 - unknown reaction   Cephalexin Other (See Comments)    colitis   Erythromycin Other (See Comments)    colitis   Niacin And Related Other (See Comments)    colitis   Nitrofurantoin Macrocrystal Other (See Comments)    Reported by [facility] 2013 - unknown reaction    Outpatient Encounter Medications as of 12/23/2020  Medication Sig   albuterol (VENTOLIN HFA) 108 (90 Base) MCG/ACT inhaler Inhale 2 puffs into the lungs every 6 (six) hours as needed for wheezing or shortness of breath.   amLODipine (NORVASC) 5 MG  tablet Take 1 tablet (5 mg total) by mouth daily.   ARTIFICIAL TEARS 1.4 % ophthalmic solution USE AS NEEDED FOR DRY EYE (Patient taking differently: Place 1 drop into both eyes as needed for dry eyes.)   ASPIRIN LOW DOSE 81 MG EC tablet TAKE ONE TABLET BY MOUTH EVERY DAY (Patient taking differently: Take 81 mg by mouth daily.)   Cholecalciferol (VITAMIN D3 PO) Take 8,000 Units by mouth daily.  (Patient not taking: Reported on 12/10/2020)   Cholecalciferol (VITAMIN D3) 50 MCG (2000 UT) TABS TAKE ONE TABLET BY MOUTH EVERY DAY (Patient taking differently: Take 2,000 Units by mouth daily.)   clopidogrel (PLAVIX) 75 MG tablet TAKE ONE TABLET BY MOUTH EVERY DAY (Patient taking differently: Take 75 mg by mouth daily.)   ezetimibe (ZETIA) 10 MG tablet TAKE ONE TABLET BY MOUTH EVERY DAY (Patient taking differently: Take 10 mg by mouth daily.)   FEROSUL 325 (65 Fe) MG tablet TAKE ONE TABLET BY MOUTH EVERY TWO DAYS with FOUR OUNCE of orange JUICE (Patient taking differently: Take 325 mg by mouth See admin instructions. Every 2 days)   fluticasone (FLONASE) 50 MCG/ACT nasal spray Place 2 sprays into both nostrils daily. (Patient not taking: Reported on 12/10/2020)   furosemide (LASIX) 20 MG tablet Take 1 tablet (20 mg total) by mouth daily as needed for edema.   GNP PAIN RELIEF EX-STRENGTH 500 MG tablet TAKE ONE TABLET BY MOUTH FOUR TIMES DAILY AS NEEDED FOR PAIN   guaiFENesin (MUCINEX) 600 MG 12 hr tablet Take 1 tablet (600 mg total) by mouth daily as needed  for to loosen phlegm.   Insulin Aspart FlexPen 100 UNIT/ML SOPN INJECT subcutaneously THREE TIMES DAILY PER sliding scales BLOOD SUGAR (150 NO INSULIN)+ 151-180 Give FOUR units. 181-220 Give SIX units, 221-260 Give EIGHT units, 261-300 Give 10 units 301-340 Give 12 units AND > 341 Give 14 units (Patient taking differently: Inject 0-14 Units into the skin See admin instructions. INJECT subcutaneously THREE TIMES DAILY PER sliding scales BLOOD SUGAR (150 NO  INSULIN). 151-180 = 4 units. 181-220 = 6 units, 221-260 = 8 units, 261-300 = 10 units 301-340 = 12 units AND > 341 = 14 units)   isosorbide mononitrate (IMDUR) 30 MG 24 hr tablet TAKE ONE TABLET BY MOUTH EVERY DAY (Patient taking differently: Take 30 mg by mouth daily.)   lamoTRIgine (LAMICTAL) 200 MG tablet TAKE 1.5 TABLETS BY MOUTH TWICE DAILY (Patient taking differently: Take 300 mg by mouth 2 (two) times daily.)   LANTUS SOLOSTAR 100 UNIT/ML Solostar Pen INJECT 20 units AT BEDTIME (Patient taking differently: Inject 20 Units into the skin at bedtime.)   losartan (COZAAR) 50 MG tablet TAKE ONE TABLET BY MOUTH EVERY DAY (Patient taking differently: Take 50 mg by mouth daily.)   metoprolol tartrate (LOPRESSOR) 50 MG tablet TAKE ONE TABLET BY MOUTH TWICE DAILY (Patient taking differently: Take 50 mg by mouth 2 (two) times daily.)   Omega-3 Fatty Acids (FISH OIL) 1000 MG CAPS TAKE TWO CAPSULES BY MOUTH EVERY DAY (Patient not taking: Reported on 12/10/2020)   phenytoin (DILANTIN) 100 MG ER capsule Take 3 capsules (300 mg total) by mouth daily. (Patient taking differently: Take 300 mg by mouth at bedtime.)   phenytoin (DILANTIN) 30 MG ER capsule Take 1 capsule (30 mg total) by mouth at bedtime.   rosuvastatin (CRESTOR) 40 MG tablet TAKE ONE TABLET BY MOUTH EVERY DAY (Patient taking differently: Take 40 mg by mouth daily.)   Spacer/Aero Chamber Mouthpiece MISC 1 Device by Does not apply route daily as needed.   SPIRIVA HANDIHALER 18 MCG inhalation capsule place ONE capsule into inhaler AND inhale EVERY DAY (Patient taking differently: Place 18 mcg into inhaler and inhale daily.)   spironolactone (ALDACTONE) 50 MG tablet TAKE ONE TABLET BY MOUTH EVERY DAY (Patient taking differently: Take 50 mg by mouth daily.)   tiZANidine (ZANAFLEX) 4 MG tablet Take 0.5 tablets (2 mg total) by mouth every 12 (twelve) hours as needed for muscle spasms.   No facility-administered encounter medications on file as of  12/23/2020.    Patient Active Problem List   Diagnosis Date Noted   Dementia without behavioral disturbance ([place]) 01/23/2019   Peripheral polyneuropathy 10/24/2018   PAD (peripheral artery disease) ([place]) 04/05/2018   Carotid artery disease ([place]) 04/05/2018   Hypokalemia 02/27/2018   Frequent falls 07/08/2017   Back pain 07/08/2017   Mixed hyperlipidemia 05/27/2017   Unstable gait 01/10/2017   Hyperlipidemia associated with type 2 diabetes mellitus ([place]) 01/10/2017   OSA on CPAP 12/24/2016   COPD (chronic obstructive pulmonary disease) ([place]) 12/24/2016   Pulmonary nodule 12/24/2016   Generalized weakness 12/12/2015   Diabetes mellitus type 2 with neurological manifestations ([place]) 12/12/2015   Hypertension with heart disease 12/12/2015   Seizure disorder as sequela of cerebrovascular accident ([place]) 04/17/2015   Atherosclerotic heart disease of native coronary artery with other forms of angina pectoris ([place]) 04/17/2015   Anemia, unspecified 03/02/2010   Cough 03/02/2010   Depressive disorder, not elsewhere classified 03/02/2010   Pneumonia, organism unspecified([place]) 03/02/2010   Shortness of breath 03/02/2010  Type II diabetes mellitus with neurological manifestations ([place]) 03/02/2010   Wheezing 03/02/2010   Chronic airway obstruction, not elsewhere classified 03/02/2010   Essential hypertension 03/02/2010   Acute, but ill-defined, cerebrovascular disease 03/02/2010    Conditions to be addressed/monitored:HTN, HLD, COPD, DMII, Dementia, and seizures  Care Plan : Diabetes Type 2 (Adult)  Updates made by Dimitri Ped, RN since 12/23/2020 12:00 AM  Completed 12/23/2020   Problem: Glycemic Management (Diabetes, Type 2) Resolved 12/23/2020  Priority: Medium     Long-Range Goal: Glycemic Management Optimized Completed 12/23/2020  Start Date: 10/30/2020  Expected End Date: 03/25/2021  Recent Progress: On track  Priority: Medium  Note:   Resolving due to duplicate goal   Objective:  Lab Results  Component Value Date   HGBA1C 6.2 07/14/2020   Lab Results  Component Value Date   CREATININE 0.94 07/14/2020   CREATININE 0.87 03/14/2019   CREATININE 0.60 09/20/2018   No results found for: EGFR Current Barriers:  Knowledge Deficits related to basic Diabetes pathophysiology and self care/management Cognitive Deficits Unable to independently self manage type 2 diabetes Unable to perform IADLs independently Spoke with daughter Kathryn Lewis.  Pt lives at [facility] assisted living.  Facility assists pt with medications, CBG checks and IADLS.  States pt's appetite is good and she will eat sweets if offered to her at her residence.  States she get confused at times especially in the evening.  States her CBG are good but she does not know the numbers.  Denies any low or really high readings Case Manager Clinical Goal(s):  patient will demonstrate improved adherence to prescribed treatment plan for diabetes self care/management as evidenced by: daily monitoring and recording of CBG  adherence to ADA/ carb modified diet adherence to prescribed medication regimen contacting provider for new or worsened symptoms or questions Interventions:  Collaboration with Martinique, Betty G, MD regarding development and update of comprehensive plan of care as evidenced by provider attestation and co-signature Inter-disciplinary care team collaboration (see longitudinal plan of care) Provided education to patient about basic DM disease process Reviewed medications with patient and discussed importance of medication adherence Discussed plans with patient for ongoing care management follow up and provided patient with direct contact information for care management team Reviewed scheduled/upcoming provider appointments including: Dr. Martinique 01/30/21 Advised patient, providing education and rationale, to check cbg daily and record, calling provider for findings outside established  parameters.   Review of patient status, including review of consultants reports, relevant laboratory and other test results, and medications completed. Self-Care Activities - UNABLE to independently self manage type 2 diabetes Attends all scheduled provider appointments Checks blood sugars as prescribed and utilize hyper and hypoglycemia protocol as needed Adheres to prescribed ADA/carb modified Patient Goals: - check blood sugar at prescribed times - check blood sugar if I feel it is too high or too low - enter blood sugar readings and medication or insulin into daily log - take the blood sugar log to all doctor visits - change to whole grain breads, cereal, pasta - drink 6 to 8 glasses of water each day - fill half of plate with vegetables - manage portion size - switch to sugar-free drinks - schedule appointment with eye doctor - check feet daily for cuts, sores or redness - keep feet up while sitting - trim toenails straight across - wash and dry feet carefully every day - wear comfortable, cotton socks - wear comfortable, well-fitting shoes Follow Up Plan: Telephone follow up appointment  with care management team member scheduled for: 12/23/20 at 1 PM The patient has been provided with contact information for the care management team and has been advised to call with any health related questions or concerns.      Care Plan : Stroke/Seizures  Updates made by Dimitri Ped, RN since 12/23/2020 12:00 AM  Completed 12/23/2020   Problem: Harm or Injury due to hx of stroke and seizures Resolved 12/23/2020  Priority: High     Long-Range Goal: Harm or Injury Prevented due to hx of stroke and seizures Completed 12/23/2020  Start Date: 10/30/2020  Expected End Date: 03/25/2021  Recent Progress: On track  Priority: High  Note:   Resolving due to duplicate goal  Current Barriers:  Knowledge Deficits related to fall precautions in patient with hx of stroke and  seizures Decreased adherence to prescribed treatment for fall prevention Unable to independently self manage fall precautions with hx of stroke and seizures Unable to self administer medications as prescribed Unable to perform IADLs independently Chronic Disease Management support and education needs related to self management of fall precautions with hx of stroke and seizures Cognitive Deficits Pt lives at [place] with daughter Kathryn Lewis.  States pt has poor safety awareness and need to be reminded to not get up quickly and to use her walker.  Denies any recent falls or seizures Clinical Goal(s):  patient will demonstrate improved adherence to prescribed treatment plan for decreasing falls as evidenced by patient reporting and review of EMR patient will verbalize using fall risk reduction strategies discussed patient will not experience additional falls patient will verbalize understanding of plan for self management of fall precautions with hx of stroke and seizures patient will not experience hospital admission. Hospital Admissions in last 6 months = 0 patient will take all medications exactly as prescribed and will call provider for medication related questions patient will attend all scheduled medical appointments: Dr. Martinique 01/30/21 patient will verbalize basic understanding of self management of fall precautions with hx of stroke and seizures disease process and self health management plan the patient will demonstrate ongoing self health care management ability Interventions:  Collaboration with Martinique, Betty G, MD regarding development and update of comprehensive plan of care as evidenced by provider attestation and co-signature Inter-disciplinary care team collaboration (see longitudinal plan of care) Provided written and verbal education re: Potential causes of falls and Fall prevention strategies Reviewed medications and discussed potential side effects  of medications such as dizziness and frequent urination Assessed for s/s of orthostatic hypotension Assessed for falls since last encounter. Assessed patients knowledge of fall risk prevention secondary to previously provided education Evaluation of current treatment plan related to self management of fall precautions with hx of stroke and seizures and patient's adherence to plan as established by provider. Advised patient, providing education and rationale, to monitor blood pressure daily and record, calling provider for findings outside established parameters.  Discussed plans with patient for ongoing care management follow up and provided patient with direct contact information for care management team Self-Care Deficits:  Unable to independently self management of fall precautions with hx of stroke and seizures Unable to self administer medications as prescribed Unable to perform IADLs independently Patient Goals:  - Utilize walker or wheelchair (assistive device) appropriately with all ambulation - De-clutter walkways - Change positions slowly - Wear secure fitting shoes at all times with ambulation - Utilize home lighting for dim lit areas - Demonstrate self and pet awareness  at all times Follow Up Plan: Telephone follow up appointment with care management team member scheduled for: 12/23/20 at 1 PM The patient has been provided with contact information for the care management team and has been advised to call with any health related questions or concerns.      Care Plan : RN Care Manager Plan of Care  Updates made by Dimitri Ped, RN since 12/23/2020 12:00 AM     Problem: Chronic Disease Management and Care Coordination Needs (DM. COPD, seizures, dementia,HTN, HLD)   Priority: High     Long-Range Goal: Establish Plan of Care for Chronic Disease Management Needs (DM. COPD, seizures, dementia,HTN, HLD)   Start Date: 12/23/2020  Expected End Date: 06/21/2021  Priority: High   Note:   Current Barriers:  Care Coordination needs related to ADL IADL limitations, Memory Deficits, Inability to perform ADL's independently, and Inability to perform IADL's independently Chronic Disease Management support and education needs related to HTN, HLD, COPD, DMII, Dementia, and Seizures  Cognitive Deficits Pt lives at [place] with daughter Kathryn Lewis.  States pt has poor safety awareness and need to be reminded to not get up quickly and to use her walker.  Denies any recent falls or seizures.Facility assists pt with medications, CBG checks and IADLS.  States pt's appetite is good and she will eat sweets if offered to her at her residence.  States she get confused at times especially in the evening.  States her CBG are good but she does not know the numbers.  Denies any low or really high readings.  States she did have a high B/P reading one day and she was sent to the ED.  States she is not certain if pt got her medications that day.  States that [employer] is now sending out [facility] to visit with pt and pt still has palliative care  RNCM Clinical Goal(s):  Patient will verbalize basic understanding of  HTN, HLD, COPD, DMII, Dementia, and seizures disease process and self health management plan as evidenced by voiced adherence to plan of care take all medications exactly as prescribed and will call provider for medication related questions as evidenced by dispense report and pt verbalization attend all scheduled medical appointments: Cardiology 12/24/20, Dr. Martinique 01/30/21 as evidenced by review of medical record demonstrate Improved adherence to prescribed treatment plan for HTN, HLD, COPD, DMII, Dementia, and seizures as evidenced by voiced adherence to plan of care continue to work with RN Care Manager to address care management and care coordination needs related to  HTN, HLD, COPD, DMII, Dementia, and seizures as evidenced by adherence  to CM Team Scheduled appointments through collaboration with RN Care manager, provider, and care team.   Interventions: 1:1 collaboration with primary care provider regarding development and update of comprehensive plan of care as evidenced by provider attestation and co-signature Inter-disciplinary care team collaboration (see longitudinal plan of care) Evaluation of current treatment plan related to  self management and patient's adherence to plan as established by provider   COPD Interventions:  (Status:  Goal on track:  Yes.) Long Term Goal Advised patient to track and manage COPD triggers Provided instruction about proper use of medications used for management of COPD including inhalers Provided education about and advised patient to utilize infection prevention strategies to reduce risk of respiratory infection   Diabetes Interventions:  (Status:  Goal on track:  Yes.) Long Term Goal Assessed patient's understanding of A1c goal: <7% Provided  education to patient about basic DM disease process Reviewed medications with patient and discussed importance of medication adherence Counseled on importance of regular laboratory monitoring as prescribed Provided patient with written educational materials related to hypo and hyperglycemia and importance of correct treatment Advised patient, providing education and rationale, to check cbg daily and record, calling provider for findings outside established parameters Review of patient status, including review of consultants reports, relevant laboratory and other test results, and medications completed Lab Results  Component Value Date   HGBA1C 6.2 07/14/2020   Falls Interventions:  (Status:  Goal on track:  Yes.) Long Term Goal Reviewed medications and discussed potential side effects of medications such as dizziness and frequent urination Advised patient of importance of notifying provider of falls Assessed for signs and symptoms of  orthostatic hypotension Assessed for falls since last encounter Assessed patients knowledge of fall risk prevention secondary to previously provided education Assessed working status of life alert bracelet and patient adherence Reviewed safety precautions with seizures   Hyperlipidemia Interventions:  (Status:  Goal on track:  Yes.) Long Term Goal Medication review performed; medication list updated in electronic medical record.  Provider established cholesterol goals reviewed Counseled on importance of regular laboratory monitoring as prescribed Reviewed importance of limiting foods high in cholesterol  Hypertension Interventions:  (Status:  Goal on track:  Yes.) Long Term Goal Last practice recorded BP readings:  BP Readings from Last 3 Encounters:  12/10/20 (!) 172/48  10/21/20 (!) 145/66  07/30/20 128/80  Most recent eGFR/CrCl: No results found for: EGFR  No components found for: CRCL  Evaluation of current treatment plan related to hypertension self management and patient's adherence to plan as established by provider Provided education to patient re: stroke prevention, s/s of heart attack and stroke Reviewed medications with patient and discussed importance of compliance Advised patient, providing education and rationale, to monitor blood pressure daily and record, calling PCP for findings outside established parameters Provided education on prescribed diet low sodium low CHO  Patient Goals/Self-Care Activities: Take all medications as prescribed Attend all scheduled provider appointments Call pharmacy for medication refills 3-7 days in advance of running out of medications Call provider office for new concerns or questions  schedule appointment with eye doctor check blood sugar at prescribed times: once daily and when you have symptoms of low or high blood sugar check feet daily for cuts, sores or redness take the blood sugar log to all doctor visits keep feet up while  sitting identify and remove indoor air pollutants limit outdoor activity during cold weather do breathing exercises every day follow rescue plan if symptoms flare-up get at least 7 to 8 hours of sleep at night check blood pressure daily take blood pressure log to all doctor appointments call doctor for signs and symptoms of high blood pressure take medications for blood pressure exactly as prescribed call for medicine refill 2 or 3 days before it runs out take all medications exactly as prescribed call doctor with any symptoms you believe are related to your medicine  Follow Up Plan:  Telephone follow up appointment with care management team member scheduled for:  03/17/21 The patient has been provided with contact information for the care management team and has been advised to call with any health related questions or concerns.       Plan:Telephone follow up appointment with care management team member scheduled for:  03/17/21 The patient has been provided with contact information for the care management team and has been  advised to call with any health related questions or concerns.  Peter Garter RN, Jackquline Denmark, CDE Care Management Coordinator [facility] [phone], Mobile [phone]

## 2020-12-23 NOTE — Patient Instructions (Signed)
Visit Information  Thank you for taking time to visit with me today. Please don't hesitate to contact me if I can be of assistance to you before our next scheduled telephone appointment.  Following are the goals we discussed today:  Take all medications as prescribed Attend all scheduled provider appointments Call pharmacy for medication refills 3-7 days in advance of running out of medications Call provider office for new concerns or questions  schedule appointment with eye doctor check blood sugar at prescribed times: once daily and when you have symptoms of low or high blood sugar check feet daily for cuts, sores or redness take the blood sugar log to all doctor visits keep feet up while sitting identify and remove indoor air pollutants limit outdoor activity during cold weather do breathing exercises every day follow rescue plan if symptoms flare-up get at least 7 to 8 hours of sleep at night check blood pressure daily take blood pressure log to all doctor appointments call doctor for signs and symptoms of high blood pressure take medications for blood pressure exactly as prescribed call for medicine refill 2 or 3 days before it runs out take all medications exactly as prescribed call doctor with any symptoms you believe are related to your medicine  Our next appointment is by telephone on 03/17/21 at 2 PM  Please call the care guide team at [phone] if you need to cancel or reschedule your appointment.   If you are experiencing a Mental Health or [place] or need someone to talk to, please call the Suicide and Crisis Lifeline: 988 call 1-800-273-TALK (toll free, 24 hour hotline) go to [facility] [address] [place] [id number]) call 911   Patient verbalizes understanding of instructions provided today and agrees to view in [place].  Peter Garter RN, Jackquline Denmark, CDE Care Management Coordinator [facility] [phone], Mobile [phone]

## 2020-12-24 ENCOUNTER — Ambulatory Visit (INDEPENDENT_AMBULATORY_CARE_PROVIDER_SITE_OTHER): Payer: HMO | Admitting: Physician Assistant

## 2020-12-24 ENCOUNTER — Encounter: Payer: Self-pay | Admitting: Physician Assistant

## 2020-12-24 ENCOUNTER — Other Ambulatory Visit: Payer: Self-pay

## 2020-12-24 ENCOUNTER — Telehealth: Payer: Self-pay | Admitting: Internal Medicine

## 2020-12-24 VITALS — BP 140/62 | HR 66 | Ht 63.0 in | Wt 136.0 lb

## 2020-12-24 DIAGNOSIS — F0390 Unspecified dementia without behavioral disturbance: Secondary | ICD-10-CM | POA: Diagnosis not present

## 2020-12-24 DIAGNOSIS — I25119 Atherosclerotic heart disease of native coronary artery with unspecified angina pectoris: Secondary | ICD-10-CM

## 2020-12-24 DIAGNOSIS — I739 Peripheral vascular disease, unspecified: Secondary | ICD-10-CM

## 2020-12-24 DIAGNOSIS — E785 Hyperlipidemia, unspecified: Secondary | ICD-10-CM

## 2020-12-24 DIAGNOSIS — Z794 Long term (current) use of insulin: Secondary | ICD-10-CM | POA: Diagnosis not present

## 2020-12-24 DIAGNOSIS — I10 Essential (primary) hypertension: Secondary | ICD-10-CM

## 2020-12-24 DIAGNOSIS — I2510 Atherosclerotic heart disease of native coronary artery without angina pectoris: Secondary | ICD-10-CM

## 2020-12-24 DIAGNOSIS — I6523 Occlusion and stenosis of bilateral carotid arteries: Secondary | ICD-10-CM

## 2020-12-24 DIAGNOSIS — E1169 Type 2 diabetes mellitus with other specified complication: Secondary | ICD-10-CM | POA: Diagnosis not present

## 2020-12-24 DIAGNOSIS — E782 Mixed hyperlipidemia: Secondary | ICD-10-CM | POA: Diagnosis not present

## 2020-12-24 DIAGNOSIS — J449 Chronic obstructive pulmonary disease, unspecified: Secondary | ICD-10-CM | POA: Diagnosis not present

## 2020-12-24 DIAGNOSIS — E1149 Type 2 diabetes mellitus with other diabetic neurological complication: Secondary | ICD-10-CM | POA: Diagnosis not present

## 2020-12-24 MED ORDER — ISOSORBIDE MONONITRATE ER 60 MG PO TB24: 60.0000 mg | ORAL_TABLET | Freq: Every day | ORAL | 3 refills | Status: DC

## 2020-12-24 NOTE — Assessment & Plan Note (Signed)
She has a history of carotid artery stenosis.  Prior ultrasound demonstrated bilateral ICA stenosis 50-69%.  As noted, we have had difficulty getting her records in the date for this study is unknown.  She again expresses that she would not want to pursue any intervention for worsening carotid stenosis.  We discussed whether or not to proceed with carotid ultrasound.  She prefers to hold off for now.

## 2020-12-24 NOTE — Assessment & Plan Note (Signed)
She had a recent visit to the emergency room with uncontrolled blood pressure.  She did not get her medications that day at her assisted living facility.  Her pressure did improve after taking all of her medications.  Her blood pressure is still somewhat uncontrolled.  Increase isosorbide to 60 mg daily as noted.  Continue amlodipine 5 mg daily, losartan 50 mg daily, metoprolol tartrate 50 mg twice a day, spironolactone 50 mg daily.

## 2020-12-24 NOTE — Telephone Encounter (Signed)
Pt c/o medication issue:  1. Name of Medication: isosorbide mononitrate (IMDUR) 60 MG 24 hr tablet  2. How are you currently taking this medication (dosage and times per day)? Take 1 tablet (60 mg total) by mouth daily.  3. Are you having a reaction (difficulty breathing--STAT)? no  4. What is your medication issue? Daugher stated they need new order for this medication to be sent to [facility] [phone] Since the dr increase her dosage

## 2020-12-24 NOTE — Assessment & Plan Note (Signed)
She continues to have bilateral lower extremity claudication with minimal activity.  Overall, her symptoms are stable.  She has not had any resting symptoms or nonhealing ulcers.  As noted, she does not want to pursue any further work-up or intervention.

## 2020-12-24 NOTE — Assessment & Plan Note (Signed)
Continue ezetimibe 10 mg daily, rosuvastatin 40 mg daily.  Recent ALT normal.  Arrange follow-up fasting lipids.

## 2020-12-24 NOTE — Assessment & Plan Note (Signed)
Kathryn Lewis has a history of severe diffuse three-vessel CAD and prior cardiac catheterization.  This was done in [facility].  We have always had difficulty getting records and believe that the catheterization was done around 2015.  Kathryn Lewis has been managed medically.  Kathryn Lewis does not have chest pain.  However, Kathryn Lewis does have dyspnea exertion.  Kathryn Lewis does not have any evidence of volume excess on exam.  Kathryn Lewis likely has multifactorial dyspnea related to claudication, deconditioning, coronary artery disease and COPD.  It is possible that her shortness of breath is an anginal equivalent.  Her blood pressure has been elevated recently.  I will increase her isosorbide to 60 mg daily.  Continue rosuvastatin 40 mg daily, amlodipine 5 mg daily, metoprolol 50 mg twice daily.  Follow-up in 6 months.  Kathryn Lewis knows to return sooner if her shortness of breath worsens.

## 2020-12-24 NOTE — Progress Notes (Signed)
Cardiology Office Note:    Date:  12/24/2020   ID:  Kathryn Lewis, Kathryn Lewis 1945/03/02, MRN [id number]  PCP:  Martinique, Betty G, MD   [facility] Providers Cardiologist:  Dorris Carnes, MD Cardiology APP:  Sharmon Revere     Referring MD: Martinique, Betty G, MD   Chief Complaint:  F/u for CAD    Patient Profile:   Kathryn Lewis is a 75 y.o. female with:  Coronary artery disease S/p prior MI, previously followed [place] cardiology in [place] w/ Dr. Harrington Challenger during admx for L arm pain and HTN urgency in 2017 Prior cath (date unknown): LAD - p50, m80, d90; D1 p60, d80; mLCx 56, mOM1 80, RCA - p20, m70, d20, EF 84 Prior CVA Subsequent seizures (Dr. Delice Lesch) Peripheral arterial disease CTA from prior Cardiologist in [facility] (Dr. Harrell Gave Boyes)>>mild to mod dz in [facility], bilat EIA occluded, bilat SFA dz Notes indicate she is not a candidate for intervention (per [employer], [employer] [facility]) Carotid artery disease [place] (date unknown): Bilat 50-69 Diabetes mellitus Hypertension Hyperlipidemia Bipolar d/o COPD  History of Present Illness: Kathryn Lewis was last seen in 2/21.  She returns for follow-up.  She is here with her daughter.  The pt had a visit to the ED a couple of weeks ago with epistaxis.  Her BP was very high (systolic ~008).  Her daughter thinks that she did not get her meds for HTN that day.  She was given all of her regular HTN meds and her BP came down to normal.  She has not had chest pain, syncope, leg edema.  She has bilateral LE claudication with any type of exertion.  She has not had rest symptoms or non-healing wounds on her feet.  She has shortness of breath with exertion.  This has gradually worsened over time.  She again reiterates that she does not want any testing as she would not pursue further intervention.        ASSESSMENT & PLAN:   CAD (coronary artery disease) She has a history of severe diffuse three-vessel CAD and prior cardiac catheterization.  This was  done in [facility].  We have always had difficulty getting records and believe that the catheterization was done around 2015.  She has been managed medically.  She does not have chest pain.  However, she does have dyspnea exertion.  She does not have any evidence of volume excess on exam.  She likely has multifactorial dyspnea related to claudication, deconditioning, coronary artery disease and COPD.  It is possible that her shortness of breath is an anginal equivalent.  Her blood pressure has been elevated recently.  I will increase her isosorbide to 60 mg daily.  Continue rosuvastatin 40 mg daily, amlodipine 5 mg daily, metoprolol 50 mg twice daily.  Follow-up in 6 months.  She knows to return sooner if her shortness of breath worsens.  Essential hypertension She had a recent visit to the emergency room with uncontrolled blood pressure.  She did not get her medications that day at her assisted living facility.  Her pressure did improve after taking all of her medications.  Her blood pressure is still somewhat uncontrolled.  Increase isosorbide to 60 mg daily as noted.  Continue amlodipine 5 mg daily, losartan 50 mg daily, metoprolol tartrate 50 mg twice a day, spironolactone 50 mg daily.  Carotid artery disease ([place]) She has a history of carotid artery stenosis.  Prior ultrasound demonstrated bilateral ICA stenosis  50-69%.  As noted, we have had difficulty getting her records in the date for this study is unknown.  She again expresses that she would not want to pursue any intervention for worsening carotid stenosis.  We discussed whether or not to proceed with carotid ultrasound.  She prefers to hold off for now.  PAD (peripheral artery disease) ([place]) She continues to have bilateral lower extremity claudication with minimal activity.  Overall, her symptoms are stable.  She has not had any resting symptoms or nonhealing ulcers.  As noted, she does not want to pursue any further work-up or  intervention.  Mixed hyperlipidemia Continue ezetimibe 10 mg daily, rosuvastatin 40 mg daily.  Recent ALT normal.  Arrange follow-up fasting lipids.          Dispo:  Return in about 6 months (around 06/23/2021) for Routine Follow Up, w/ Richardson Dopp, PA-C.    Prior CV studies: Echocardiogram 06/29/2018 EF >65, mild concentric LVH, normal RV SF, mild AI, mild calcification of aortic valve   Echocardiogram 04/18/2015 EF 55-60, normal wall motion, grade 1 diastolic dysfunction, mild MR   Carotid US (date unknown) Bilateral ICA 50-69   Cardiac catheterization (date unknown - ?2015) at [facility] [place] LM normal LAD proximal 50, mid 80, distal 90; D1 proximal 60, distal 80 LCx mid 40; OM 2 mid 80 RCA proximal 20, mid 70, distal 20 EF 65   Echo 12/11 EF 60-65     Past Medical History:  Diagnosis Date   Allergy    Arthritis    Bipolar 1 disorder (HCC)    Blood transfusion without reported diagnosis    Carotid artery stenosis    50-69% bilateral ICA stenoses by velocity criteria but no visible plaque and ICA/CCA ratio 1.72 on right and 1.38 on left   Coronary artery disease    Depression    Diabetes mellitus without complication (HCC)    Echocardiogram    Echo 06/2018: Hyperdynamic systolic function, EF >37, mild concentric LVH, elevated LVEDP (E/E' suggests impaired relaxation), mild AI, lipomatous interatrial septum   Hypertension    Left adrenal mass (HCC)    2.5 cm L adrenal mass on CT per note of Dr. Seleta Rhymes 05/20/2014   MI, old    PAD (peripheral artery disease) ([place])    03/2014 - CT aortogram with lower extremity runoff (mild to moderate disease in common iliac arteries, complete occulusion of her bilateral external iliac arteries with heavily disease common femoral arteries. She also has disease to her SFAs bilaterally. Popliteal arteries and tibial vessel runoff appears to be adequate)   Seizures (HCC)    Stroke (HCC)    Current Medications: Current Meds  Medication Sig    albuterol (VENTOLIN HFA) 108 (90 Base) MCG/ACT inhaler Inhale 2 puffs into the lungs every 6 (six) hours as needed for wheezing or shortness of breath.   amLODipine (NORVASC) 5 MG tablet Take 1 tablet (5 mg total) by mouth daily.   ARTIFICIAL TEARS 1.4 % ophthalmic solution USE AS NEEDED FOR DRY EYE   ASPIRIN LOW DOSE 81 MG EC tablet TAKE ONE TABLET BY MOUTH EVERY DAY   Cholecalciferol (VITAMIN D3 PO) Take 8,000 Units by mouth daily.   Cholecalciferol (VITAMIN D3) 50 MCG (2000 UT) TABS TAKE ONE TABLET BY MOUTH EVERY DAY   clopidogrel (PLAVIX) 75 MG tablet TAKE ONE TABLET BY MOUTH EVERY DAY   ezetimibe (ZETIA) 10 MG tablet TAKE ONE TABLET BY MOUTH EVERY DAY   FEROSUL 325 (65 Fe) MG tablet  TAKE ONE TABLET BY MOUTH EVERY TWO DAYS with FOUR OUNCE of orange JUICE   fluticasone (FLONASE) 50 MCG/ACT nasal spray Place 2 sprays into both nostrils daily.   furosemide (LASIX) 20 MG tablet Take 1 tablet (20 mg total) by mouth daily as needed for edema.   GNP PAIN RELIEF EX-STRENGTH 500 MG tablet TAKE ONE TABLET BY MOUTH FOUR TIMES DAILY AS NEEDED FOR PAIN   guaiFENesin (MUCINEX) 600 MG 12 hr tablet Take 1 tablet (600 mg total) by mouth daily as needed for to loosen phlegm.   Insulin Aspart FlexPen 100 UNIT/ML SOPN INJECT subcutaneously THREE TIMES DAILY PER sliding scales BLOOD SUGAR (150 NO INSULIN)+ 151-180 Give FOUR units. 181-220 Give SIX units, 221-260 Give EIGHT units, 261-300 Give 10 units 301-340 Give 12 units AND > 341 Give 14 units   isosorbide mononitrate (IMDUR) 60 MG 24 hr tablet Take 1 tablet (60 mg total) by mouth daily.   lamoTRIgine (LAMICTAL) 200 MG tablet TAKE 1.5 TABLETS BY MOUTH TWICE DAILY   LANTUS SOLOSTAR 100 UNIT/ML Solostar Pen INJECT 20 units AT BEDTIME   losartan (COZAAR) 50 MG tablet TAKE ONE TABLET BY MOUTH EVERY DAY   metoprolol tartrate (LOPRESSOR) 50 MG tablet TAKE ONE TABLET BY MOUTH TWICE DAILY   Omega-3 Fatty Acids (FISH OIL) 1000 MG CAPS TAKE TWO CAPSULES BY MOUTH EVERY  DAY   phenytoin (DILANTIN) 100 MG ER capsule Take 3 capsules (300 mg total) by mouth daily.   phenytoin (DILANTIN) 30 MG ER capsule Take 1 capsule (30 mg total) by mouth at bedtime.   rosuvastatin (CRESTOR) 40 MG tablet TAKE ONE TABLET BY MOUTH EVERY DAY   Spacer/Aero Chamber Mouthpiece MISC 1 Device by Does not apply route daily as needed.   SPIRIVA HANDIHALER 18 MCG inhalation capsule place ONE capsule into inhaler AND inhale EVERY DAY   spironolactone (ALDACTONE) 50 MG tablet TAKE ONE TABLET BY MOUTH EVERY DAY   tiZANidine (ZANAFLEX) 4 MG tablet Take 0.5 tablets (2 mg total) by mouth every 12 (twelve) hours as needed for muscle spasms.   [DISCONTINUED] isosorbide mononitrate (IMDUR) 30 MG 24 hr tablet TAKE ONE TABLET BY MOUTH EVERY DAY    Allergies:   Influenza vaccines, Amoxicillin, Augmentin [amoxicillin-pot clavulanate], Cefadroxil, Cephalexin, Erythromycin, Niacin and related, and Nitrofurantoin macrocrystal   Social History   Tobacco Use   Smoking status: Former    Packs/day: 1.00    Types: Cigarettes    Quit date: 2015    Years since quitting: 7.9   Smokeless tobacco: Never  Vaping Use   Vaping Use: Never used  Substance Use Topics   Alcohol use: No   Drug use: No    Family Hx: The patient's family history includes Alcohol abuse in her father; Arthritis in her daughter, maternal grandmother, mother, and son; Asthma in her daughter; Birth defects in her brother; COPD in her daughter, father, and son; Cancer in her maternal grandmother; Depression in her brother; Diabetes in her mother; Early death in her father and mother; Heart attack in her father and son; Hyperlipidemia in her brother, father, mother, and son; Hypertension in her brother, father, mother, sister, and son; Stroke in her mother.  ROS see HPI  EKGs/Labs/Other Test Reviewed:    EKG:  EKG is   ordered today.  The ekg ordered today demonstrates NSR, HR 66, normal axis, non-specific ST-TW changes, QTc 410 ms, no  change from prior tracing.   Recent Labs: 07/14/2020: ALT 23 12/10/2020: BUN 18; Creatinine, Ser 1.15;  Hemoglobin 11.5; Platelets 211; Potassium 4.1; Sodium 130   Recent Lipid Panel Lab Results  Component Value Date/Time   CHOL 183 09/09/2017 08:40 AM   TRIG 160 (H) 09/09/2017 08:40 AM   HDL 66 09/09/2017 08:40 AM   LDLCALC 85 09/09/2017 08:40 AM     Risk Assessment/Calculations:          Physical Exam:    VS:  BP 140/62   Pulse 66   Ht 5\' 3"  (1.6 m)   Wt 136 lb (61.7 kg)   LMP 03/17/2015   SpO2 98%   BMI 24.09 kg/m     Wt Readings from Last 3 Encounters:  12/24/20 136 lb (61.7 kg)  10/21/20 133 lb (60.3 kg)  03/20/19 130 lb 9.6 oz (59.2 kg)    Constitutional:      Appearance: Healthy appearance. Not in distress.  Neck:     Vascular: No JVR. JVD normal.  Pulmonary:     Effort: Pulmonary effort is normal.     Breath sounds: No wheezing. No rales.  Cardiovascular:     Normal rate. Regular rhythm. Normal S1. Normal S2.      Murmurs: There is no murmur.  Edema:    Peripheral edema absent.  Abdominal:     Palpations: Abdomen is soft.  Skin:    General: Skin is warm and dry.  Neurological:     Mental Status: Alert and oriented to person, place and time.     Cranial Nerves: Cranial nerves are intact.        Medication Adjustments/Labs and Tests Ordered: Current medicines are reviewed at length with the patient today.  Concerns regarding medicines are outlined above.  Tests Ordered: Orders Placed This Encounter  Procedures   Lipid Profile   EKG 12-Lead    Medication Changes: Meds ordered this encounter  Medications   isosorbide mononitrate (IMDUR) 60 MG 24 hr tablet    Sig: Take 1 tablet (60 mg total) by mouth daily.    Dispense:  90 tablet    Refill:  3    Signed, Richardson Dopp, PA-C  12/24/2020 2:24 PM    [address], [place], [place]  [address] Phone: [phone]; Fax: [phone]

## 2020-12-24 NOTE — Patient Instructions (Signed)
Medication Instructions:  1.Increase (Imdur) isosorbide mononitrate to 60 mg, take one tablet by mouth daily *If you need a refill on your cardiac medications before your next appointment, please call your pharmacy*   Lab Work: Lipids in 2 weeks If you have labs (blood work) drawn today and your tests are completely normal, you will receive your results only by: [place] (if you have MyChart) OR A paper copy in the mail If you have any lab test that is abnormal or we need to change your treatment, we will call you to review the results.    Follow-Up: At [facility], you and your health needs are our priority.  As part of our continuing mission to provide you with exceptional heart care, we have created designated Provider Care Teams.  These Care Teams include your primary Cardiologist (physician) and Advanced Practice Providers (APPs -  Physician Assistants and Nurse Practitioners) who all work together to provide you with the care you need, when you need it.   Your next appointment:   6 month(s)  The format for your next appointment:   In Person  Provider:   Richardson Dopp, PA-C

## 2020-12-24 NOTE — Telephone Encounter (Signed)
Order faxed to the [facility] for her room C2.. [phone] for her Imdur.

## 2021-01-12 ENCOUNTER — Other Ambulatory Visit: Payer: Self-pay | Admitting: Family Medicine

## 2021-01-12 ENCOUNTER — Other Ambulatory Visit: Payer: HMO

## 2021-01-21 ENCOUNTER — Non-Acute Institutional Stay: Payer: HMO | Admitting: Hospice

## 2021-01-21 ENCOUNTER — Other Ambulatory Visit: Payer: Self-pay

## 2021-01-21 DIAGNOSIS — Z515 Encounter for palliative care: Secondary | ICD-10-CM

## 2021-01-21 DIAGNOSIS — R296 Repeated falls: Secondary | ICD-10-CM | POA: Diagnosis not present

## 2021-01-21 DIAGNOSIS — E782 Mixed hyperlipidemia: Secondary | ICD-10-CM | POA: Diagnosis not present

## 2021-01-21 DIAGNOSIS — E1169 Type 2 diabetes mellitus with other specified complication: Secondary | ICD-10-CM

## 2021-01-21 DIAGNOSIS — J449 Chronic obstructive pulmonary disease, unspecified: Secondary | ICD-10-CM | POA: Diagnosis not present

## 2021-01-21 DIAGNOSIS — R682 Dry mouth, unspecified: Secondary | ICD-10-CM

## 2021-01-21 DIAGNOSIS — M1990 Unspecified osteoarthritis, unspecified site: Secondary | ICD-10-CM | POA: Diagnosis not present

## 2021-01-21 NOTE — Progress Notes (Signed)
[place] Consult Note Telephone: [phone]  Fax: [phone]  PATIENT NAME: Kathryn Lewis DOB: [birth year] MRN: [id number]  PRIMARY CARE PROVIDER:   Martinique, Betty G, MD Martinique, Betty G, MD [place],  [place] [address]  REFERRING PROVIDER: Martinique, Betty G, MD Martinique, Betty G, MD [place],  [place] [address]  RESPONSIBLE PARTY:  [name] is the Legal guardian Contact Information     Name Relation Home Work Mobile   [place]  [place] Daughter   [phone]       Visit is to build trust and highlight Palliative Medicine as specialized medical care for people living with serious illness, aimed at facilitating better quality of life through symptoms relief, assisting with advance care planning and complex medical decision making. This is a follow up visit.  RECOMMENDATIONS/PLAN:   Advance Care Planning/Code Status: Patient is a Do Not Resuscitate.   Goals of Care: Goals of care include to maximize quality of life and symptom management.  Visit consisted of counseling and education dealing with the complex and emotionally intense issues of symptom management and palliative care in the setting of serious and potentially life-threatening illness.  Patient proudly discussed how her daughter came and painted and decorated her room. Therapeutic listening and ample emotional support provided. Palliative care team will continue to support patient, patient's family, and medical team.  Symptom management/Plan:  COPD: NO recent COPD exacerbation. Continue Spiriva daily. Patient has Albuterol on hand. Avoid triggers, encourage slow deep breathing. Type 2 DM: Continue insulin. Last A1c 6.2 07/14/2020.  Education reiterated on no concentrated sweets.  Repeat A1c every 3-6 months.  Dry mouth: Encourage adequate hydration; provide for small sips of water through the day to help lubricate the mouth. OTC Biotene dry  mouth oral rinse recommended; discussed this with facility Director of [place] Follow up: Palliative care will continue to follow for complex medical decision making, advance care planning, and clarification of goals. Return 6 weeks or prn. Encouraged to call provider sooner with any concerns.  CHIEF COMPLAINT: Palliative follow up  HISTORY OF PRESENT ILLNESS:  Kathryn Lewis a 75 y.o. female with multiple medical problems including COPD with no recent exacerbation, type 2 diabetes mellitus, hypertension, hyperlipidemia, CAD status post CVA.  Patient denies respiratory distress, no pain/discomfort. She denies epistaxis. She reports dry mouth, though it may not appear so. History obtained from review of EMR, discussion with primary team, family and/or patient. Records reviewed and summarized above. All 10 point systems reviewed and are negative except as documented in history of present illness above  Review and summarization of Epic records shows history from other than patient.   Palliative Care was asked to follow this patient o help address complex decision making in the context of advance care planning and goals of care clarification.   PHYSICAL EXAM  General: In no acute distress, appropriately dressed Cardiovascular: regular rate and rhythm; no edema in BLE Pulmonary: no cough, no increased work of breathing, normal respiratory effort Abdomen: soft, non tender, no guarding, positive bowel sounds in all quadrants GU:  no suprapubic tenderness Eyes: Normal lids, no discharge ENMT: Moist mucous membranes Musculoskeletal:  ambulatory with assistance device; uses wheelchair sometimes Skin: no rash to visible skin, warm without cyanosis,  Psych: non-anxious affect Neurological: Weakness but otherwise non focal Heme/lymph/immuno: no bruises, no bleeding  PERTINENT MEDICATIONS:  Outpatient Encounter Medications as of 01/21/2021  Medication Sig   albuterol (VENTOLIN  HFA) 108 (90  Base) MCG/ACT inhaler Inhale 2 puffs into the lungs every 6 (six) hours as needed for wheezing or shortness of breath.   amLODipine (NORVASC) 5 MG tablet Take 1 tablet (5 mg total) by mouth daily.   ARTIFICIAL TEARS 1.4 % ophthalmic solution USE AS NEEDED FOR DRY EYE   ASPIRIN LOW DOSE 81 MG EC tablet TAKE ONE TABLET BY MOUTH EVERY DAY   Cholecalciferol (VITAMIN D3 PO) Take 8,000 Units by mouth daily.   Cholecalciferol (VITAMIN D3) 50 MCG (2000 UT) TABS TAKE ONE TABLET BY MOUTH EVERY DAY   clopidogrel (PLAVIX) 75 MG tablet TAKE ONE TABLET BY MOUTH EVERY DAY   ezetimibe (ZETIA) 10 MG tablet TAKE ONE TABLET BY MOUTH EVERY DAY   FEROSUL 325 (65 Fe) MG tablet TAKE ONE TABLET BY MOUTH EVERY TWO DAYS with FOUR OUNCE of orange JUICE   fluticasone (FLONASE) 50 MCG/ACT nasal spray Place 2 sprays into both nostrils daily.   furosemide (LASIX) 20 MG tablet Take 1 tablet (20 mg total) by mouth daily as needed for edema.   GNP PAIN RELIEF EX-STRENGTH 500 MG tablet TAKE ONE TABLET BY MOUTH FOUR TIMES DAILY AS NEEDED FOR PAIN   guaiFENesin (MUCINEX) 600 MG 12 hr tablet Take 1 tablet (600 mg total) by mouth daily as needed for to loosen phlegm.   Insulin Aspart FlexPen 100 UNIT/ML SOPN INJECT subcutaneously THREE TIMES DAILY PER sliding scales BLOOD SUGAR (150 NO INSULIN)+ 151-180 Give FOUR units. 181-220 Give SIX units, 221-260 Give EIGHT units, 261-300 Give 10 units 301-340 Give 12 units AND > 341 Give 14 units   isosorbide mononitrate (IMDUR) 60 MG 24 hr tablet Take 1 tablet (60 mg total) by mouth daily.   lamoTRIgine (LAMICTAL) 200 MG tablet TAKE 1.5 TABLETS BY MOUTH TWICE DAILY   LANTUS SOLOSTAR 100 UNIT/ML Solostar Pen INJECT 20 units AT BEDTIME   losartan (COZAAR) 50 MG tablet TAKE ONE TABLET BY MOUTH EVERY DAY   metoprolol tartrate (LOPRESSOR) 50 MG tablet TAKE ONE TABLET BY MOUTH TWICE DAILY   Omega-3 Fatty Acids (FISH OIL) 1000 MG CAPS TAKE TWO CAPSULES BY MOUTH EVERY DAY   phenytoin (DILANTIN) 100 MG  ER capsule Take 3 capsules (300 mg total) by mouth daily.   phenytoin (DILANTIN) 30 MG ER capsule Take 1 capsule (30 mg total) by mouth at bedtime.   rosuvastatin (CRESTOR) 40 MG tablet TAKE ONE TABLET BY MOUTH EVERY DAY   Spacer/Aero Chamber Mouthpiece MISC 1 Device by Does not apply route daily as needed.   SPIRIVA HANDIHALER 18 MCG inhalation capsule place ONE capsule into inhaler AND inhale EVERY DAY   spironolactone (ALDACTONE) 50 MG tablet TAKE ONE TABLET BY MOUTH EVERY DAY   tiZANidine (ZANAFLEX) 4 MG tablet Take 0.5 tablets (2 mg total) by mouth every 12 (twelve) hours as needed for muscle spasms.   No facility-administered encounter medications on file as of 01/21/2021.    HOSPICE ELIGIBILITY/DIAGNOSIS: TBD  PAST MEDICAL HISTORY:  Past Medical History:  Diagnosis Date   Allergy    Arthritis    Bipolar 1 disorder ([place])    Blood transfusion without reported diagnosis    Carotid artery stenosis    50-69% bilateral ICA stenoses by velocity criteria but no visible plaque and ICA/CCA ratio 1.72 on right and 1.38 on left   Coronary artery disease    Depression    Diabetes mellitus without complication (HCC)    Echocardiogram    Echo 06/2018: Hyperdynamic systolic function, EF >  65, mild concentric LVH, elevated LVEDP (E/E' suggests impaired relaxation), mild AI, lipomatous interatrial septum   Hypertension    Left adrenal mass (HCC)    2.5 cm L adrenal mass on CT per note of Dr. Seleta Rhymes 05/20/2014   MI, old    PAD (peripheral artery disease) ([place])    03/2014 - CT aortogram with lower extremity runoff (mild to moderate disease in common iliac arteries, complete occulusion of her bilateral external iliac arteries with heavily disease common femoral arteries. She also has disease to her SFAs bilaterally. Popliteal arteries and tibial vessel runoff appears to be adequate)   Seizures (HCC)    Stroke ([place])      ALLERGIES:  Allergies  Allergen Reactions   Influenza Vaccines Anaphylaxis    Amoxicillin Other (See Comments)    colitis   Augmentin [Amoxicillin-Pot Clavulanate] Other (See Comments)    colitis   Cefadroxil Other (See Comments)    Reported by [facility] 2013 - unknown reaction   Cephalexin Other (See Comments)    colitis   Erythromycin Other (See Comments)    colitis   Niacin And Related Other (See Comments)    colitis   Nitrofurantoin Macrocrystal Other (See Comments)    Reported by [facility] 2013 - unknown reaction      I spent 60 minutes providing this consultation; this includes time spent with patient/family, chart review and documentation. More than 50% of the time in this consultation was spent on counseling and coordinating communication   Thank you for the opportunity to participate in the care of [place] Please call our office at [phone] if we can be of additional assistance.  Note: Portions of this note were generated with [employer]. Dictation errors may occur despite best attempts at proofreading.  Teodoro Spray, NP

## 2021-01-29 NOTE — Progress Notes (Signed)
HPI: Kathryn Lewis is a 76 y.o. female, who is here today with her son for follow up. Usually her daughter brings her for follow ups but she recently had knee surgery. She lives at [facility] assisted living.  CAD,PAD,and HLD: Since her last visit she has followed with her cardiologist.  Her son states that she needs FLP done but she just ate. Lab Results  Component Value Date   CHOL 183 09/09/2017   HDL 66 09/09/2017   LDLCALC 85 09/09/2017   TRIG 160 (H) 09/09/2017   CHOLHDL 2.8 09/09/2017   She is on Crestor 40 mg daily and Zetia 10 mg daily. Following low fat diet. She is walking some with a walker. LE pain stable.  Evaluated in the ED because epistaxis, 12/10/20. BP was elevated, 172/48. She has BP checked regularly, she is not sure about BP readings. Negative for unusual/frequent headache,CP,palpitations,PND,orthopnea,or edema.  Imdur dose was increased from 30 mg to 60 mg . She is also on Amlodipine 5 mg,Losartan 50 mg,Metoprolol tartrate 50 mg bid,and Spironolactone 50 mg daily.  COPD: Stable SOB, exacerbated by exertion. Denies cough or wheezing. States that she is on spiriva once daily. She has Albuterol inh to be given if she has wheezing or SOB.  She is under palliative care, last follow up on 01/21/21. Code status: DNR.  Diabetes Mellitus II: Dx'ed in 2008. - Checking BG at home: She is not sure about BS's but states that some have been "high."Remembers 154 2 days ago. - Medications: Lantus 15 U daily. - Compliance: Good. - Diet: Provided at assisted living. - Exercise: None, unstable gait. Currently on PT.  - Negative for symptoms of hypoglycemia, polyuria, polydipsia, foot ulcers/trauma Hx of peripheral neuropathy.  Lab Results  Component Value Date   HGBA1C 6.2 07/14/2020   Lab Results  Component Value Date   MICROALBUR 11.9 (H) 07/14/2020   Seizure disorder, follows annually with neurologist, Dr Delice Lesch. Last visit 10/21/20.  Review  of Systems  Constitutional:  Positive for fatigue. Negative for chills and fever.  HENT:  Negative for mouth sores and sore throat.   Gastrointestinal:  Negative for abdominal pain, nausea and vomiting.  Genitourinary:  Negative for decreased urine volume, dysuria and hematuria.  Musculoskeletal:  Positive for gait problem.  Skin:  Negative for pallor and rash.  Neurological:  Negative for seizures, syncope and facial asymmetry.  Rest see pertinent positives and negatives per HPI.  Current Outpatient Medications on File Prior to Visit  Medication Sig Dispense Refill   albuterol (VENTOLIN HFA) 108 (90 Base) MCG/ACT inhaler Inhale 2 puffs into the lungs every 6 (six) hours as needed for wheezing or shortness of breath. 1 each 6   amLODipine (NORVASC) 5 MG tablet Take 1 tablet (5 mg total) by mouth daily. 30 tablet 5   ARTIFICIAL TEARS 1.4 % ophthalmic solution USE AS NEEDED FOR DRY EYE 15 mL 6   ASPIRIN LOW DOSE 81 MG EC tablet TAKE ONE TABLET BY MOUTH EVERY DAY 30 tablet 2   Cholecalciferol (VITAMIN D3 PO) Take 8,000 Units by mouth daily.     Cholecalciferol (VITAMIN D3) 50 MCG (2000 UT) TABS TAKE ONE TABLET BY MOUTH EVERY DAY 30 tablet 5   clopidogrel (PLAVIX) 75 MG tablet TAKE ONE TABLET BY MOUTH EVERY DAY 30 tablet 5   ezetimibe (ZETIA) 10 MG tablet TAKE ONE TABLET BY MOUTH EVERY DAY 30 tablet 5   FEROSUL 325 (65 Fe) MG tablet TAKE ONE TABLET BY MOUTH  EVERY TWO DAYS with FOUR OUNCE of orange JUICE 100 tablet 0   fluticasone (FLONASE) 50 MCG/ACT nasal spray Place 2 sprays into both nostrils daily. 16 g 6   furosemide (LASIX) 20 MG tablet Take 1 tablet (20 mg total) by mouth daily as needed for edema. 30 tablet 3   GNP PAIN RELIEF EX-STRENGTH 500 MG tablet TAKE ONE TABLET BY MOUTH FOUR TIMES DAILY AS NEEDED FOR PAIN 120 tablet 0   guaiFENesin (MUCINEX) 600 MG 12 hr tablet Take 1 tablet (600 mg total) by mouth daily as needed for to loosen phlegm. 24 tablet 0   Insulin Aspart FlexPen 100  UNIT/ML SOPN INJECT subcutaneously THREE TIMES DAILY PER sliding scales BLOOD SUGAR (150 NO INSULIN)+ 151-180 Give FOUR units. 181-220 Give SIX units, 221-260 Give EIGHT units, 261-300 Give 10 units 301-340 Give 12 units AND > 341 Give 14 units 15 mL 1   isosorbide mononitrate (IMDUR) 30 MG 24 hr tablet Take 30 mg by mouth daily.     lamoTRIgine (LAMICTAL) 200 MG tablet TAKE 1.5 TABLETS BY MOUTH TWICE DAILY 270 tablet 3   LANTUS SOLOSTAR 100 UNIT/ML Solostar Pen INJECT 20 units AT BEDTIME 15 mL 4   losartan (COZAAR) 50 MG tablet TAKE ONE TABLET BY MOUTH EVERY DAY 30 tablet 5   metoprolol tartrate (LOPRESSOR) 50 MG tablet TAKE ONE TABLET BY MOUTH TWICE DAILY 60 tablet 5   Omega-3 Fatty Acids (FISH OIL) 1000 MG CAPS TAKE TWO CAPSULES BY MOUTH EVERY DAY 60 capsule 5   phenytoin (DILANTIN) 100 MG ER capsule Take 3 capsules (300 mg total) by mouth daily. 270 capsule 3   phenytoin (DILANTIN) 30 MG ER capsule Take 1 capsule (30 mg total) by mouth at bedtime. 90 capsule 3   rosuvastatin (CRESTOR) 40 MG tablet TAKE ONE TABLET BY MOUTH EVERY DAY 30 tablet 5   Spacer/Aero Chamber Mouthpiece MISC 1 Device by Does not apply route daily as needed. 1 each 0   SPIRIVA HANDIHALER 18 MCG inhalation capsule place ONE capsule into inhaler AND inhale EVERY DAY 30 capsule 5   spironolactone (ALDACTONE) 50 MG tablet TAKE ONE TABLET BY MOUTH EVERY DAY 30 tablet 5   tiZANidine (ZANAFLEX) 4 MG tablet Take 0.5 tablets (2 mg total) by mouth every 12 (twelve) hours as needed for muscle spasms. 30 tablet 2   No current facility-administered medications on file prior to visit.   Past Medical History:  Diagnosis Date   Allergy    Arthritis    Bipolar 1 disorder ([place])    Blood transfusion without reported diagnosis    Carotid artery stenosis    50-69% bilateral ICA stenoses by velocity criteria but no visible plaque and ICA/CCA ratio 1.72 on right and 1.38 on left   Coronary artery disease    Depression    Diabetes  mellitus without complication (HCC)    Echocardiogram    Echo 06/2018: Hyperdynamic systolic function, EF >37, mild concentric LVH, elevated LVEDP (E/E' suggests impaired relaxation), mild AI, lipomatous interatrial septum   Hypertension    Left adrenal mass (HCC)    2.5 cm L adrenal mass on CT per note of Dr. Seleta Rhymes 05/20/2014   MI, old    PAD (peripheral artery disease) ([place])    03/2014 - CT aortogram with lower extremity runoff (mild to moderate disease in common iliac arteries, complete occulusion of her bilateral external iliac arteries with heavily disease common femoral arteries. She also has disease to her SFAs bilaterally. Popliteal arteries  and tibial vessel runoff appears to be adequate)   Seizures (HCC)    Stroke (HCC)    Allergies  Allergen Reactions   Influenza Vaccines Anaphylaxis   Amoxicillin Other (See Comments)    colitis   Augmentin [Amoxicillin-Pot Clavulanate] Other (See Comments)    colitis   Cefadroxil Other (See Comments)    Reported by [facility] 2013 - unknown reaction   Cephalexin Other (See Comments)    colitis   Erythromycin Other (See Comments)    colitis   Niacin And Related Other (See Comments)    colitis   Nitrofurantoin Macrocrystal Other (See Comments)    Reported by [facility] 2013 - unknown reaction    Social History   Socioeconomic History   Marital status: Single    Spouse name: Not on file   Number of children: 2   Years of education: Not on file   Highest education level: Not on file  Occupational History   Not on file  Tobacco Use   Smoking status: Former    Packs/day: 1.00    Types: Cigarettes    Quit date: 2015    Years since quitting: 8.0   Smokeless tobacco: Never  Vaping Use   Vaping Use: Never used  Substance and Sexual Activity   Alcohol use: No   Drug use: No   Sexual activity: Not on file  Other Topics Concern   Not on file  Social History Narrative   Pt lives in assisted living facility   Has 2 adult  children, she is widowed, husband died in his early 33's in a work related accident   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  Physical Exam Vitals and nursing note reviewed.  Constitutional:      General: She is not in acute distress.    Appearance: She is well-developed.  HENT:     Head: Normocephalic and atraumatic.     Mouth/Throat:     Mouth: Mucous membranes are moist.     Pharynx: Oropharynx is clear.  Eyes:     Conjunctiva/sclera: Conjunctivae normal.  Cardiovascular:     Rate and Rhythm: Normal rate and regular rhythm.     Heart sounds: No murmur heard.    Comments: DP pulses present. Pulmonary:     Effort: Pulmonary effort is normal. No respiratory distress.     Breath sounds: Normal breath sounds.  Abdominal:     Palpations: Abdomen is soft.     Tenderness: There is no abdominal tenderness.  Skin:    General: Skin is warm.     Findings: No erythema or rash.  Neurological:     General: No focal deficit present.     Mental Status: She is alert. Mental status is at baseline.     Comments: Oriented in place and person, does not remember date. She is in a wheel chair.  Psychiatric:     Comments: Well groomed, good eye contact.   Diabetic Foot Exam - Simple   Simple Foot Form Diabetic Foot exam was performed with the following findings: Yes 01/30/2021  2:32 PM  Visual Inspection No deformities, no ulcerations, no other skin breakdown bilaterally: Yes Sensation Testing See comments: Yes Pulse Check See comments: Yes Comments DP pulses present. Monofilament mildly decreased.    ASSESSMENT AND PLAN:  [name] was seen today  for follow-up.  Diagnoses and all orders for this visit: Orders Placed This Encounter  Procedures   Fructosamine   POC HgB A1c   Lab Results  Component Value Date   HGBA1C 5.9 01/30/2021   Diabetes mellitus type 2 with neurological manifestations ([place]) HgA1C at goal, went from 6.2 to 5.9. She is reporting some "high" BS's, so for now no changes in Lantus dose. Instructed to bring BS's logs with her next visit. Annual eye exam and foot care recommended. F/U in 5-6 months   Hypertension with heart disease BP adequately controlled. Continue imdur 60 mg daily,Amlodipine 5 mg,Losartan 50 mg,Metoprolol tartrate 50 mg bid,and Spironolactone 50 mg daily. Low salt diet to continue.  Hyperlipidemia associated with type 2 diabetes mellitus (HCC) Goal < 70. She is not fasting today. Continue Crestor 40 mg daily and Zetia 10 mg daily.  COPD (chronic obstructive pulmonary disease) (HCC) Asymptomatic. She has not needed treatment. Continue Spiriva 1 cap daily and Albuterol inh 1-2 puff qid prn.  PAD (peripheral artery disease) ([place]) Following with cardiologist. Continue Crestor and Plavix same dose.  Coronary artery disease involving native coronary artery of native heart with angina pectoris [facility]) Following with cardiologist. Continue Metoprolol,Imdur,Amlodipine,Plavix,and aspirin.  Localization-related symptomatic epilepsy and epileptic syndromes with complex partial seizures, not intractable, without status epilepticus [facility]) Following with neurologist.  Return in about 6 months (around 07/30/2021).   Jolissa Kapral G. [place], MD  [facility]. [place] office.

## 2021-01-30 ENCOUNTER — Encounter: Payer: Self-pay | Admitting: Family Medicine

## 2021-01-30 ENCOUNTER — Ambulatory Visit (INDEPENDENT_AMBULATORY_CARE_PROVIDER_SITE_OTHER): Payer: HMO | Admitting: Family Medicine

## 2021-01-30 VITALS — BP 128/80 | HR 72 | Resp 16 | Ht 63.0 in

## 2021-01-30 DIAGNOSIS — G40209 Localization-related (focal) (partial) symptomatic epilepsy and epileptic syndromes with complex partial seizures, not intractable, without status epilepticus: Secondary | ICD-10-CM

## 2021-01-30 DIAGNOSIS — I119 Hypertensive heart disease without heart failure: Secondary | ICD-10-CM

## 2021-01-30 DIAGNOSIS — E1149 Type 2 diabetes mellitus with other diabetic neurological complication: Secondary | ICD-10-CM | POA: Diagnosis not present

## 2021-01-30 DIAGNOSIS — J449 Chronic obstructive pulmonary disease, unspecified: Secondary | ICD-10-CM

## 2021-01-30 DIAGNOSIS — E1169 Type 2 diabetes mellitus with other specified complication: Secondary | ICD-10-CM

## 2021-01-30 DIAGNOSIS — E785 Hyperlipidemia, unspecified: Secondary | ICD-10-CM

## 2021-01-30 DIAGNOSIS — I739 Peripheral vascular disease, unspecified: Secondary | ICD-10-CM

## 2021-01-30 DIAGNOSIS — I25119 Atherosclerotic heart disease of native coronary artery with unspecified angina pectoris: Secondary | ICD-10-CM | POA: Diagnosis not present

## 2021-01-30 LAB — POCT GLYCOSYLATED HEMOGLOBIN (HGB A1C): HbA1c, POC (prediabetic range): 5.9 % (ref 5.7–6.4)

## 2021-01-30 NOTE — Assessment & Plan Note (Signed)
Goal < 70. She is not fasting today. Continue Crestor 40 mg daily and Zetia 10 mg daily.

## 2021-01-30 NOTE — Assessment & Plan Note (Addendum)
Asymptomatic. She has not needed treatment. Continue Spiriva 1 cap daily and Albuterol inh 1-2 puff qid prn.

## 2021-01-30 NOTE — Patient Instructions (Addendum)
A few things to remember from today's visit:  Diabetes mellitus type 2 with neurological manifestations ([place]) - Plan: Fructosamine  Hypertension with heart disease  Hyperlipidemia associated with type 2 diabetes mellitus ([place])  If you need refills please call your pharmacy. Do not use My Chart to request refills or for acute issues that need immediate attention. We could not have cholesterol check today because you ate. So have it done at your cardiologist's office next time, remember to fast. No changes today, Bring blood sugar numbers next time.  Please be sure medication list is accurate. If a new problem present, please set up appointment sooner than planned today.

## 2021-01-30 NOTE — Assessment & Plan Note (Addendum)
BP adequately controlled. Continue imdur 60 mg daily,Amlodipine 5 mg,Losartan 50 mg,Metoprolol tartrate 50 mg bid,and Spironolactone 50 mg daily. Low salt diet to continue.

## 2021-01-30 NOTE — Assessment & Plan Note (Signed)
HgA1C at goal, went from 6.2 to 5.9. She is reporting some "high" BS's, so for now no changes in Lantus dose. Instructed to bring BS's logs with her next visit. Annual eye exam and foot care recommended. F/U in 5-6 months

## 2021-01-31 ENCOUNTER — Encounter: Payer: Self-pay | Admitting: Family Medicine

## 2021-02-20 ENCOUNTER — Other Ambulatory Visit: Payer: Self-pay | Admitting: Family Medicine

## 2021-02-21 DIAGNOSIS — M1990 Unspecified osteoarthritis, unspecified site: Secondary | ICD-10-CM | POA: Diagnosis not present

## 2021-02-21 DIAGNOSIS — R296 Repeated falls: Secondary | ICD-10-CM | POA: Diagnosis not present

## 2021-02-23 ENCOUNTER — Other Ambulatory Visit: Payer: Self-pay | Admitting: Family Medicine

## 2021-03-17 ENCOUNTER — Ambulatory Visit (INDEPENDENT_AMBULATORY_CARE_PROVIDER_SITE_OTHER): Payer: HMO

## 2021-03-17 DIAGNOSIS — E1149 Type 2 diabetes mellitus with other diabetic neurological complication: Secondary | ICD-10-CM

## 2021-03-17 DIAGNOSIS — G40909 Epilepsy, unspecified, not intractable, without status epilepticus: Secondary | ICD-10-CM

## 2021-03-17 DIAGNOSIS — E1169 Type 2 diabetes mellitus with other specified complication: Secondary | ICD-10-CM

## 2021-03-17 DIAGNOSIS — J449 Chronic obstructive pulmonary disease, unspecified: Secondary | ICD-10-CM

## 2021-03-17 DIAGNOSIS — I69398 Other sequelae of cerebral infarction: Secondary | ICD-10-CM

## 2021-03-17 DIAGNOSIS — F0390 Unspecified dementia without behavioral disturbance: Secondary | ICD-10-CM

## 2021-03-17 DIAGNOSIS — I119 Hypertensive heart disease without heart failure: Secondary | ICD-10-CM

## 2021-03-17 NOTE — Patient Instructions (Signed)
Visit Information  Thank you for taking time to visit with me today. Please don't hesitate to contact me if I can be of assistance to you before our next scheduled telephone appointment.  Following are the goals we discussed today:  Take all medications as prescribed Attend all scheduled provider appointments Call pharmacy for medication refills 3-7 days in advance of running out of medications Call provider office for new concerns or questions  schedule appointment with eye doctor check blood sugar at prescribed times: once daily and when you have symptoms of low or high blood sugar check feet daily for cuts, sores or redness take the blood sugar log to all doctor visits manage portion size switch to sugar-free drinks keep feet up while sitting identify and remove indoor air pollutants limit outdoor activity during cold weather do breathing exercises every day follow rescue plan if symptoms flare-up get at least 7 to 8 hours of sleep at night check blood pressure daily take blood pressure log to all doctor appointments call doctor for signs and symptoms of high blood pressure take medications for blood pressure exactly as prescribed call for medicine refill 2 or 3 days before it runs out take all medications exactly as prescribed call doctor with any symptoms you believe are related to your medicine  Our next appointment is by telephone on 06/16/21 at 2:15 PM  Please call the care guide team at [phone] if you need to cancel or reschedule your appointment.   If you are experiencing a Mental Health or [place] or need someone to talk to, please call the Suicide and Crisis Lifeline: 988 call the [place] National Suicide Prevention Lifeline: (249) 754-8143 or TTY: 929-172-7322 TTY (330)146-3843) to talk to a trained counselor call 1-800-273-TALK (toll free, 24 hour hotline) go to [facility] [address], [place]  [phone]) call 911   Patient verbalizes understanding of instructions and care plan provided today and agrees to view in [place]. Active MyChart status confirmed with patient.   Peter Garter RN, Jackquline [place], CDE Care Management Coordinator [facility] [phone]

## 2021-03-17 NOTE — Chronic Care Management (AMB) (Signed)
Chronic Care Management   CCM RN Visit Note  03/17/2021 Name: Kathryn Lewis MRN: [id number] DOB: 1945-12-28  Subjective: Kathryn Lewis is a 76 y.o. year old female who is a primary care patient of [place], Malka So, MD. The care management team was consulted for assistance with disease management and care coordination needs.    Engaged with patient by telephone for follow up visit in response to provider referral for case management and/or care coordination services.   Consent to Services:  The patient was given information about Chronic Care Management services, agreed to services, and gave verbal consent prior to initiation of services.  Please see initial visit note for detailed documentation.   Patient agreed to services and verbal consent obtained.   Assessment: Review of patient past medical history, allergies, medications, health status, including review of consultants reports, laboratory and other test data, was performed as part of comprehensive evaluation and provision of chronic care management services.   SDOH (Social Determinants of Health) assessments and interventions performed:    CCM Care Plan  Allergies  Allergen Reactions   Influenza Vaccines Anaphylaxis   Amoxicillin Other (See Comments)    colitis   Augmentin [Amoxicillin-Pot Clavulanate] Other (See Comments)    colitis   Cefadroxil Other (See Comments)    Reported by [facility] 2013 - unknown reaction   Cephalexin Other (See Comments)    colitis   Erythromycin Other (See Comments)    colitis   Niacin And Related Other (See Comments)    colitis   Nitrofurantoin Macrocrystal Other (See Comments)    Reported by [facility] 2013 - unknown reaction    Outpatient Encounter Medications as of 03/17/2021  Medication Sig   albuterol (VENTOLIN HFA) 108 (90 Base) MCG/ACT inhaler Inhale 2 puffs into the lungs every 6 (six) hours as needed for wheezing or shortness of breath.   amLODipine (NORVASC) 5 MG  tablet Take 1 tablet (5 mg total) by mouth daily.   ARTIFICIAL TEARS 1.4 % ophthalmic solution USE AS NEEDED FOR DRY EYE   ASPIRIN LOW DOSE 81 MG EC tablet TAKE ONE TABLET BY MOUTH EVERY DAY   Cholecalciferol (VITAMIN D3 PO) Take 8,000 Units by mouth daily.   Cholecalciferol (VITAMIN D3) 50 MCG (2000 UT) TABS TAKE ONE TABLET BY MOUTH EVERY DAY   clopidogrel (PLAVIX) 75 MG tablet TAKE ONE TABLET BY MOUTH EVERY DAY   ezetimibe (ZETIA) 10 MG tablet TAKE ONE TABLET BY MOUTH EVERY DAY   FEROSUL 325 (65 Fe) MG tablet TAKE ONE TABLET BY MOUTH EVERY TWO DAYS with FOUR OUNCE of orange JUICE   fluticasone (FLONASE) 50 MCG/ACT nasal spray Place 2 sprays into both nostrils daily.   furosemide (LASIX) 20 MG tablet Take 1 tablet (20 mg total) by mouth daily as needed for edema.   GNP PAIN RELIEF EX-STRENGTH 500 MG tablet TAKE ONE TABLET BY MOUTH FOUR TIMES DAILY AS NEEDED FOR PAIN   guaiFENesin (MUCINEX) 600 MG 12 hr tablet Take 1 tablet (600 mg total) by mouth daily as needed for to loosen phlegm.   Insulin Aspart FlexPen 100 UNIT/ML SOPN INJECT subcutaneously THREE TIMES DAILY PER sliding scales BLOOD SUGAR (150 NO INSULIN)+ 151-180 Give FOUR units. 181-220 Give SIX units, 221-260 Give EIGHT units, 261-300 Give 10 units 301-340 Give 12 units AND > 341 Give 14 units   isosorbide mononitrate (IMDUR) 30 MG 24 hr tablet Take 30 mg by mouth daily.   lamoTRIgine (LAMICTAL) 200 MG tablet TAKE 1.5 TABLETS  BY MOUTH TWICE DAILY   LANTUS SOLOSTAR 100 UNIT/ML Solostar Pen INJECT 20 units AT BEDTIME   losartan (COZAAR) 50 MG tablet TAKE ONE TABLET BY MOUTH EVERY DAY   metoprolol tartrate (LOPRESSOR) 50 MG tablet TAKE ONE TABLET BY MOUTH TWICE DAILY   Omega-3 Fatty Acids (FISH OIL) 1000 MG CAPS TAKE TWO CAPSULES BY MOUTH EVERY DAY   phenytoin (DILANTIN) 100 MG ER capsule Take 3 capsules (300 mg total) by mouth daily.   phenytoin (DILANTIN) 30 MG ER capsule Take 1 capsule (30 mg total) by mouth at bedtime.   rosuvastatin  (CRESTOR) 40 MG tablet TAKE ONE TABLET BY MOUTH EVERY DAY   Spacer/Aero Chamber Mouthpiece MISC 1 Device by Does not apply route daily as needed.   SPIRIVA HANDIHALER 18 MCG inhalation capsule place ONE capsule into inhaler AND inhale EVERY DAY   spironolactone (ALDACTONE) 50 MG tablet TAKE ONE TABLET BY MOUTH EVERY DAY   tiZANidine (ZANAFLEX) 4 MG tablet Take 0.5 tablets (2 mg total) by mouth every 12 (twelve) hours as needed for muscle spasms.   No facility-administered encounter medications on file as of 03/17/2021.    Patient Active Problem List   Diagnosis Date Noted   Dementia without behavioral disturbance ([place]) 01/23/2019   Peripheral polyneuropathy 10/24/2018   PAD (peripheral artery disease) ([place]) 04/05/2018   Carotid artery disease ([place]) 04/05/2018   Hypokalemia 02/27/2018   Frequent falls 07/08/2017   Back pain 07/08/2017   Mixed hyperlipidemia 05/27/2017   Unstable gait 01/10/2017   Hyperlipidemia associated with type 2 diabetes mellitus ([place]) 01/10/2017   OSA on CPAP 12/24/2016   COPD (chronic obstructive pulmonary disease) ([place]) 12/24/2016   Pulmonary nodule 12/24/2016   Generalized weakness 12/12/2015   Diabetes mellitus type 2 with neurological manifestations ([place]) 12/12/2015   Hypertension with heart disease 12/12/2015   Seizure disorder as sequela of cerebrovascular accident ([place]) 04/17/2015   CAD (coronary artery disease) 04/17/2015   Anemia, unspecified 03/02/2010   Cough 03/02/2010   Depressive disorder, not elsewhere classified 03/02/2010   Pneumonia, organism unspecified([place]) 03/02/2010   Shortness of breath 03/02/2010   Type II diabetes mellitus with neurological manifestations ([place]) 03/02/2010   Wheezing 03/02/2010   Chronic airway obstruction, not elsewhere classified 03/02/2010   Essential hypertension 03/02/2010   Acute, but ill-defined, cerebrovascular disease 03/02/2010    Conditions to be addressed/monitored:HTN, HLD, COPD, DMII, Dementia, and  seizures  Care Plan : RN Care Manager Plan of Care  Updates made by Dimitri Ped, RN since 03/17/2021 12:00 AM     Problem: Chronic Disease Management and Care Coordination Needs (DM. COPD, seizures, dementia,HTN, HLD)   Priority: High     Long-Range Goal: Establish Plan of Care for Chronic Disease Management Needs (DM. COPD, seizures, dementia,HTN, HLD)   Start Date: 12/23/2020  Expected End Date: 06/21/2021  Priority: High  Note:   Current Barriers:  Care Coordination needs related to ADL IADL limitations, Memory Deficits, Inability to perform ADL's independently, and Inability to perform IADL's independently Chronic Disease Management support and education needs related to HTN, HLD, COPD, DMII, Dementia, and Seizures  Cognitive Deficits Pt lives at [place] with daughter Julena Barbour.  States pt still has poor safety awareness and need to be reminded to not get up quickly and to use her walker.  Denies any recent falls or seizures. Facility assists pt with medications, CBG checks and IADLS.  States pt's appetite is good and she will eat sweets if offered to her  at her residence.  States she get confused at times especially in the evening.  States her CBG are good but she does not know the numbers.  Denies any low or really high readings.  States her B/P have been good. Denies any shortness of breath or coughing. States that [facility] had a  visit with pt last week and pt still has palliative care  RNCM Clinical Goal(s):  Patient will verbalize basic understanding of  HTN, HLD, COPD, DMII, Dementia, and seizures disease process and self health management plan as evidenced by voiced adherence to plan of care take all medications exactly as prescribed and will call provider for medication related questions as evidenced by dispense report and pt verbalization attend all scheduled medical appointments: no up coming scheduled as evidenced by review of medical  record demonstrate Improved adherence to prescribed treatment plan for HTN, HLD, COPD, DMII, Dementia, and seizures as evidenced by voiced adherence to plan of care continue to work with RN Care Manager to address care management and care coordination needs related to  HTN, HLD, COPD, DMII, Dementia, and seizures as evidenced by adherence to CM Team Scheduled appointments through collaboration with RN Care manager, provider, and care team.   Interventions: 1:1 collaboration with primary care provider regarding development and update of comprehensive plan of care as evidenced by provider attestation and co-signature Inter-disciplinary care team collaboration (see longitudinal plan of care) Evaluation of current treatment plan related to  self management and patient's adherence to plan as established by provider   COPD Interventions:  (Status:  Goal on track:  Yes.) Long Term Goal Advised patient to track and manage COPD triggers Provided instruction about proper use of medications used for management of COPD including inhalers Advised patient to self assesses COPD action plan zone and make appointment with provider if in the yellow zone for 48 hours without improvement Provided education about and advised patient to utilize infection prevention strategies to reduce risk of respiratory infection   Diabetes Interventions:  (Status:  Goal on track:  Yes.) Long Term Goal Assessed patient's understanding of A1c goal: <7% Provided education to patient about basic DM disease process Reviewed medications with patient and discussed importance of medication adherence Counseled on importance of regular laboratory monitoring as prescribed Provided patient with written educational materials related to hypo and hyperglycemia and importance of correct treatment Advised patient, providing education and rationale, to check cbg daily and record, calling provider for findings outside established  parameters Review of patient status, including review of consultants reports, relevant laboratory and other test results, and medications completed Reviewed to try to encourage pt to limit concentrated sweets Lab Results  Component Value Date   HGBA1C 5.9 01/30/2021   Falls Interventions:  (Status:  Goal on track:  Yes.) Long Term Goal Reviewed medications and discussed potential side effects of medications such as dizziness and frequent urination Advised patient of importance of notifying provider of falls Assessed for falls since last encounter Assessed patients knowledge of fall risk prevention secondary to previously provided education Assessed working status of life alert bracelet and patient adherence Reinforced safety precautions with seizures and to encourage to use walker at all times   Hyperlipidemia Interventions:  (Status:  Condition stable.  Not addressed this visit.) Long Term Goal Medication review performed; medication list updated in electronic medical record.  Provider established cholesterol goals reviewed Counseled on importance of regular laboratory monitoring as prescribed Reviewed importance of limiting foods high in cholesterol  Hypertension Interventions:  (  Status:  Goal on track:  Yes.) Long Term Goal Last practice recorded BP readings:  BP Readings from Last 3 Encounters:  01/30/21 128/80  12/24/20 140/62  12/10/20 (!) 172/48  Most recent eGFR/CrCl: No results found for: EGFR  No components found for: CRCL  Evaluation of current treatment plan related to hypertension self management and patient's adherence to plan as established by provider Provided education to patient re: stroke prevention, s/s of heart attack and stroke Reviewed medications with patient and discussed importance of compliance Advised patient, providing education and rationale, to monitor blood pressure daily and record, calling PCP for findings outside established parameters Provided  education on prescribed diet low sodium low CHO Reviewed to notify provider for elevated B/P readings  Patient Goals/Self-Care Activities: Take all medications as prescribed Attend all scheduled provider appointments Call pharmacy for medication refills 3-7 days in advance of running out of medications Call provider office for new concerns or questions  schedule appointment with eye doctor check blood sugar at prescribed times: once daily and when you have symptoms of low or high blood sugar check feet daily for cuts, sores or redness take the blood sugar log to all doctor visits manage portion size switch to sugar-free drinks keep feet up while sitting identify and remove indoor air pollutants limit outdoor activity during cold weather do breathing exercises every day follow rescue plan if symptoms flare-up get at least 7 to 8 hours of sleep at night check blood pressure daily take blood pressure log to all doctor appointments call doctor for signs and symptoms of high blood pressure take medications for blood pressure exactly as prescribed call for medicine refill 2 or 3 days before it runs out take all medications exactly as prescribed call doctor with any symptoms you believe are related to your medicine  Follow Up Plan:  Telephone follow up appointment with care management team member scheduled for:  06/16/21 The patient has been provided with contact information for the care management team and has been advised to call with any health related questions or concerns.       Plan:Telephone follow up appointment with care management team member scheduled for:  06/16/21 The patient has been provided with contact information for the care management team and has been advised to call with any health related questions or concerns.  Peter Garter RN, Jackquline Denmark, CDE Care Management Coordinator [facility] [phone]

## 2021-03-18 ENCOUNTER — Other Ambulatory Visit: Payer: Self-pay | Admitting: Family Medicine

## 2021-03-18 DIAGNOSIS — I2510 Atherosclerotic heart disease of native coronary artery without angina pectoris: Secondary | ICD-10-CM

## 2021-03-23 ENCOUNTER — Other Ambulatory Visit: Payer: Self-pay | Admitting: Family Medicine

## 2021-03-23 DIAGNOSIS — I10 Essential (primary) hypertension: Secondary | ICD-10-CM

## 2021-03-24 ENCOUNTER — Non-Acute Institutional Stay: Payer: HMO | Admitting: Hospice

## 2021-03-24 ENCOUNTER — Other Ambulatory Visit: Payer: Self-pay

## 2021-03-24 DIAGNOSIS — E1169 Type 2 diabetes mellitus with other specified complication: Secondary | ICD-10-CM

## 2021-03-24 DIAGNOSIS — M1990 Unspecified osteoarthritis, unspecified site: Secondary | ICD-10-CM | POA: Diagnosis not present

## 2021-03-24 DIAGNOSIS — E1149 Type 2 diabetes mellitus with other diabetic neurological complication: Secondary | ICD-10-CM | POA: Diagnosis not present

## 2021-03-24 DIAGNOSIS — Z515 Encounter for palliative care: Secondary | ICD-10-CM

## 2021-03-24 DIAGNOSIS — E785 Hyperlipidemia, unspecified: Secondary | ICD-10-CM

## 2021-03-24 DIAGNOSIS — F0390 Unspecified dementia without behavioral disturbance: Secondary | ICD-10-CM

## 2021-03-24 DIAGNOSIS — R269 Unspecified abnormalities of gait and mobility: Secondary | ICD-10-CM

## 2021-03-24 DIAGNOSIS — J449 Chronic obstructive pulmonary disease, unspecified: Secondary | ICD-10-CM

## 2021-03-24 DIAGNOSIS — R296 Repeated falls: Secondary | ICD-10-CM | POA: Diagnosis not present

## 2021-03-24 DIAGNOSIS — I119 Hypertensive heart disease without heart failure: Secondary | ICD-10-CM

## 2021-03-24 NOTE — Progress Notes (Addendum)
[place] Consult Note Telephone: [phone]  Fax: [phone]  PATIENT NAME: Kathryn Lewis DOB: [birth year] MRN: [id number]  PRIMARY CARE PROVIDER:   [place], Betty G, MD Martin[place], Betty G, MD [place],  [place] [address]  REFERRING PROVIDER: [place], Betty G, MD [place], Betty G, MD [place],  [place] [address]  RESPONSIBLE PARTY:  Kathryn Lewis is the Legal guardian Contact Information     Name Relation Home Work Mobile   [place]  [place] Daughter   [phone]       Visit is to build trust and highlight Palliative Medicine as specialized medical care for people living with serious illness, aimed at facilitating better quality of life through symptoms relief, assisting with advance care planning and complex medical decision making. This is a follow up visit.  RECOMMENDATIONS/PLAN:   Advance Care Planning/Code Status: Patient is a Do Not Resuscitate.   Goals of Care: Goals of care include to maximize quality of life and symptom management.  Visit consisted of counseling and education dealing with the complex and emotionally intense issues of symptom management and palliative care in the setting of serious and potentially life-threatening illness.  Palliative care team will continue to support patient, patient's family, and medical team.  Symptom management/Plan:  COPD: Stable. No recent COPD exacerbation. SOB on exertion. Continue Spiriva daily. Patient has Albuterol on hand. Avoid triggers, encourage slow deep breathing. Type 2 DM: Continue insulin. Last A1c 6.2 07/14/2020.  Education reiterated on no concentrated sweets.  Repeat A1c. Pt saw PCP 01/30/2021.  Negative for symptoms of hypoglycemia, polyuria, polydipsia, foot ulcers. Gait disturbance: Completed PT. Fall precautions in place. Walks short distance with rolling walker; gets around mostly on her wheelchair.  Follow up: Palliative care will  continue to follow for complex medical decision making, advance care planning, and clarification of goals. Return 6 weeks or prn. Encouraged to call provider sooner with any concerns.  CHIEF COMPLAINT: Palliative follow up  HISTORY OF PRESENT ILLNESS:  Kathryn Lewis a 76 y.o. female with multiple medical problems including COPD, type 2 diabetes mellitus, hypertension, hyperlipidemia, CAD status post CVA.  Patient denies respiratory distress, no pain/discomfort. She denies epistaxis, negative for symptoms of hypoglycemia, hyperglycemia, polyuria, polydipsia, foot ulcers.  History obtained from review of EMR, discussion with primary team, family and/or patient. Records reviewed and summarized above. All 10 point systems reviewed and are negative except as documented in history of present illness above  Review and summarization of Epic records shows history from other than patient.   Palliative Care was asked to follow this patient to help address complex decision making in the context of advance care planning and goals of care clarification.   PHYSICAL EXAM  General: cooperative, appropriately dressed Cardiovascular: regular rate and rhythm; no edema in BLE Pulmonary: no cough, no increased work of breathing, normal respiratory effort Abdomen: soft, non tender, no guarding, positive bowel sounds in all quadrants GU:  no suprapubic tenderness Eyes: Normal lids, no discharge ENMT: Moist mucous membranes Musculoskeletal:  ambulatory with assistance device; uses wheelchair sometimes Skin: no rash to visible skin, warm without cyanosis,  Psych: non-anxious affect Neurological: Weakness but otherwise non focal, memory loss Heme/lymph/immuno: no bruises, no bleeding  PERTINENT MEDICATIONS:  Outpatient Encounter Medications as of 03/24/2021  Medication Sig   albuterol (VENTOLIN HFA) 108 (90 Base) MCG/ACT inhaler Inhale 2 puffs into the lungs every 6 (six) hours as needed for wheezing or shortness  of  breath.   amLODipine (NORVASC) 5 MG tablet Take 1 tablet (5 mg total) by mouth daily.   ARTIFICIAL TEARS 1.4 % ophthalmic solution USE AS NEEDED FOR DRY EYE   ASPIRIN LOW DOSE 81 MG EC tablet TAKE ONE TABLET BY MOUTH EVERY DAY   Cholecalciferol (VITAMIN D3 PO) Take 8,000 Units by mouth daily.   Cholecalciferol (VITAMIN D3) 50 MCG (2000 UT) TABS TAKE ONE TABLET BY MOUTH EVERY DAY   clopidogrel (PLAVIX) 75 MG tablet TAKE ONE TABLET BY MOUTH EVERY DAY   ezetimibe (ZETIA) 10 MG tablet TAKE ONE TABLET BY MOUTH EVERY DAY   FEROSUL 325 (65 Fe) MG tablet TAKE ONE TABLET BY MOUTH EVERY TWO DAYS with FOUR OUNCE of orange JUICE   fluticasone (FLONASE) 50 MCG/ACT nasal spray Place 2 sprays into both nostrils daily.   furosemide (LASIX) 20 MG tablet Take 1 tablet (20 mg total) by mouth daily as needed for edema.   GNP PAIN RELIEF EX-STRENGTH 500 MG tablet TAKE ONE TABLET BY MOUTH FOUR TIMES DAILY AS NEEDED FOR PAIN   guaiFENesin (MUCINEX) 600 MG 12 hr tablet Take 1 tablet (600 mg total) by mouth daily as needed for to loosen phlegm.   Insulin Aspart FlexPen 100 UNIT/ML SOPN INJECT subcutaneously THREE TIMES DAILY PER sliding scales BLOOD SUGAR (150 NO INSULIN)+ 151-180 Give FOUR units. 181-220 Give SIX units, 221-260 Give EIGHT units, 261-300 Give 10 units 301-340 Give 12 units AND > 341 Give 14 units   isosorbide mononitrate (IMDUR) 30 MG 24 hr tablet Take 30 mg by mouth daily.   lamoTRIgine (LAMICTAL) 200 MG tablet TAKE 1.5 TABLETS BY MOUTH TWICE DAILY   LANTUS SOLOSTAR 100 UNIT/ML Solostar Pen INJECT 20 units AT BEDTIME   losartan (COZAAR) 50 MG tablet TAKE ONE TABLET BY MOUTH EVERY DAY   metoprolol tartrate (LOPRESSOR) 50 MG tablet TAKE ONE TABLET BY MOUTH TWICE DAILY   Omega-3 Fatty Acids (FISH OIL) 1000 MG CAPS TAKE TWO CAPSULES BY MOUTH EVERY DAY   phenytoin (DILANTIN) 100 MG ER capsule Take 3 capsules (300 mg total) by mouth daily.   phenytoin (DILANTIN) 30 MG ER capsule Take 1 capsule (30 mg  total) by mouth at bedtime.   rosuvastatin (CRESTOR) 40 MG tablet TAKE ONE TABLET BY MOUTH EVERY DAY   Spacer/Aero Chamber Mouthpiece MISC 1 Device by Does not apply route daily as needed.   SPIRIVA HANDIHALER 18 MCG inhalation capsule place ONE capsule into inhaler AND inhale EVERY DAY   spironolactone (ALDACTONE) 50 MG tablet TAKE ONE TABLET BY MOUTH EVERY DAY   tiZANidine (ZANAFLEX) 4 MG tablet Take 0.5 tablets (2 mg total) by mouth every 12 (twelve) hours as needed for muscle spasms.   No facility-administered encounter medications on file as of 03/24/2021.    HOSPICE ELIGIBILITY/DIAGNOSIS: TBD  PAST MEDICAL HISTORY:  Past Medical History:  Diagnosis Date   Allergy    Arthritis    Bipolar 1 disorder ([place])    Blood transfusion without reported diagnosis    Carotid artery stenosis    50-69% bilateral ICA stenoses by velocity criteria but no visible plaque and ICA/CCA ratio 1.72 on right and 1.38 on left   Coronary artery disease    Depression    Diabetes mellitus without complication (HCC)    Echocardiogram    Echo 06/2018: Hyperdynamic systolic function, EF >38, mild concentric LVH, elevated LVEDP (E/E' suggests impaired relaxation), mild AI, lipomatous interatrial septum   Hypertension    Left adrenal mass ([place])  2.5 cm L adrenal mass on CT per note of Dr. Seleta Rhymes 05/20/2014   MI, old    PAD (peripheral artery disease) ([place])    03/2014 - CT aortogram with lower extremity runoff (mild to moderate disease in common iliac arteries, complete occulusion of her bilateral external iliac arteries with heavily disease common femoral arteries. She also has disease to her SFAs bilaterally. Popliteal arteries and tibial vessel runoff appears to be adequate)   Seizures (HCC)    Stroke ([place])      ALLERGIES:  Allergies  Allergen Reactions   Influenza Vaccines Anaphylaxis   Amoxicillin Other (See Comments)    colitis   Augmentin [Amoxicillin-Pot Clavulanate] Other (See Comments)    colitis    Cefadroxil Other (See Comments)    Reported by [facility] 2013 - unknown reaction   Cephalexin Other (See Comments)    colitis   Erythromycin Other (See Comments)    colitis   Niacin And Related Other (See Comments)    colitis   Nitrofurantoin Macrocrystal Other (See Comments)    Reported by [facility] 2013 - unknown reaction    I spent 45 minutes providing this consultation; time includes spent with patient/family, chart review and documentation. More than 50% of the time in this consultation was spent on care coordination  Thank you for the opportunity to participate in the care of [place] Please call our office at [phone] if we can be of additional assistance.  Note: Portions of this note were generated with [employer]. Dictation errors may occur despite best attempts at proofreading.  Teodoro Spray, NP

## 2021-03-24 NOTE — Addendum Note (Signed)
Addended by: Laverda Sorenson I on: 03/24/2021 04:23 PM   Modules accepted: Level of Service

## 2021-03-26 ENCOUNTER — Other Ambulatory Visit: Payer: Self-pay | Admitting: Family Medicine

## 2021-03-26 DIAGNOSIS — I119 Hypertensive heart disease without heart failure: Secondary | ICD-10-CM

## 2021-04-01 ENCOUNTER — Other Ambulatory Visit: Payer: Self-pay | Admitting: Family Medicine

## 2021-04-02 ENCOUNTER — Other Ambulatory Visit: Payer: Self-pay | Admitting: Family Medicine

## 2021-04-10 ENCOUNTER — Telehealth: Payer: Self-pay

## 2021-04-10 NOTE — Telephone Encounter (Signed)
I received a call from [place] at the [facility] of Services as they were doing a review of the patient's living facility. They have not been giving pt her iron, Lamictal, or insulin correctly. They also haven't been taking her blood pressure daily as ordered. Advised we had not received any notice of this. Hal Hope thanked me for answering his questions. Two patient identifiers were used to verify the correct patient.  ?

## 2021-04-22 ENCOUNTER — Telehealth: Payer: Self-pay | Admitting: Family Medicine

## 2021-04-22 MED ORDER — POLYVINYL ALCOHOL 1.4 % OP SOLN: 1.0000 [drp] | Freq: Two times a day (BID) | OPHTHALMIC | 6 refills | Status: AC

## 2021-04-22 NOTE — Telephone Encounter (Signed)
Kathryn Lewis pharm tech with [facility] can not read the rx written on 04-15-2021 ARTIFICIAL TEARS 1.4 % ophthalmic solution twice a day instead of as needed. Please resend  ?[place], [place] Phone:  [phone]  ?Fax:  [phone]  ?  ? ?

## 2021-04-25 ENCOUNTER — Other Ambulatory Visit: Payer: Self-pay | Admitting: Family Medicine

## 2021-05-04 ENCOUNTER — Other Ambulatory Visit: Payer: Self-pay | Admitting: Family Medicine

## 2021-05-04 DIAGNOSIS — M545 Low back pain, unspecified: Secondary | ICD-10-CM

## 2021-05-04 DIAGNOSIS — G8929 Other chronic pain: Secondary | ICD-10-CM

## 2021-05-21 ENCOUNTER — Telehealth: Payer: Self-pay | Admitting: Family Medicine

## 2021-05-21 NOTE — Telephone Encounter (Signed)
Zettie Pho from [employer] called to follow up on the paperwork that he sent over needing Dr.Jordan's signature. He is hoping another provider can sign for her as he needs to turn this in tomorrow for patient. He does not need anything filled out as the FL2 is prefilled and only needing a signature and for the medication portion only the agree or disagree box needs to be checked for if they have the correct medications, doses, instructions and then a signature. ? ? ? ? ? ? ?Please advise  ?

## 2021-05-22 NOTE — Telephone Encounter (Signed)
Form faxed

## 2021-05-22 NOTE — Telephone Encounter (Signed)
Medications reviewed. ?Please print list of medications to attach to form. ?Form signed. ?Thanks, ?BJ ?

## 2021-05-25 NOTE — Progress Notes (Addendum)
ACUTE VISIT Chief Complaint  Patient presents with   bleeding    X a week, on & off. Unsure if it is vaginal or rectal.   HPI: Kathryn Lewis is a 76 y.o. female with hx of DM II,peripheral neuropathy, seizure disorder,PAD,HTN, and dementia on palliative care here today with her daughter complaining of bleeding. She does not recall details, her daughter helps with hx. She mentioned it to her daughter a couple weeks ago. Small blood clot on tissue.  She states that it has resolved. Noted blood on tissue after urination,defecation, and on underwear. She is not sure if she is having blood from vagina or rectum. Her daughter went to the bathroom with her, blood on tissue when wiping around vagina after urination and when did around rectum without defecation, about 1/2-1 tsp. No hx of trauma.  Negative for fever,abnormal wt loss,abdominal pain,changes in bowel habits, dyschezia, nausea, vomiting,melena, pelvic pain,or urinary symptoms. CAD and PAD on Plavix 75 mg daily and Aspirin 81 mg daily. She is on Metoprolol tartrate 50 mg bid,Imdur 30 mg,and Amlodipine 5 mg daily. She is also on Losartan 50 mg daily and Spironolactone for HTN.  She has not noted nose/gum bleeding, CP,SOB,palpitations,or LE edema. Numbness LE's, stable. She is on Rosuvastatin 40 mg daily and Zetia 10 mg daily.  Anemia: She is on Ferrous sulfate 325 mg every other day. Component     Latest Ref Rng 12/10/2020  WBC     4.0 - 10.5 K/uL 8.5   RBC     3.87 - 5.11 Mil/uL 3.84 (L)   Hemoglobin     12.0 - 15.0 g/dL 16.1 (L)   HCT     09.6 - 46.0 % 35.3 (L)   MCV     78.0 - 100.0 fl 91.9   MCH     26.0 - 34.0 pg 29.9   MCHC     30.0 - 36.0 g/dL 04.5   RDW     40.9 - 81.1 % 14.1   Platelets     150.0 - 400.0 K/uL 211   Neutrophils     % 75   NEUT#     1.7 - 7.7 K/uL 6.4   Lymphocytes     % 14   Lymphocyte #     0.7 - 4.0 K/uL 1.2   Monocytes Relative     % 7   Monocyte #     0.1 - 1.0 K/uL  0.6   Eosinophil     % 2   Eosinophils Absolute     0.0 - 0.5 K/uL 0.1   Basophil     % 1   Basophils Absolute     0.0 - 0.1 K/uL 0.0   nRBC     0.0 - 0.2 % 0.0   Immature Granulocytes     % 1   Abs Immature Granulocytes     0.00 - 0.07 K/uL 0.09 (H)    Review of Systems  Constitutional:  Positive for fatigue. Negative for activity change and appetite change.  Respiratory:  Negative for shortness of breath and wheezing.   Gastrointestinal:  Positive for hematochezia. Negative for abdominal pain, nausea and vomiting.  Genitourinary:  Negative for decreased urine volume, dysuria and hematuria.  Skin:  Negative for color change.  Neurological:  Negative for syncope and headaches.  Rest see pertinent positives and negatives per HPI.  Current Outpatient Medications on File Prior to Visit  Medication Sig Dispense Refill  albuterol (VENTOLIN HFA) 108 (90 Base) MCG/ACT inhaler Inhale 2 puffs into the lungs every 6 (six) hours as needed for wheezing or shortness of breath. 1 each 6   amLODipine (NORVASC) 5 MG tablet Take 1 tablet (5 mg total) by mouth daily. 30 tablet 5   ASPIRIN LOW DOSE 81 MG EC tablet TAKE ONE TABLET BY MOUTH EVERY DAY 30 tablet 2   Cholecalciferol (VITAMIN D3 PO) Take 8,000 Units by mouth daily.     Cholecalciferol (VITAMIN D3) 50 MCG (2000 UT) TABS TAKE ONE TABLET BY MOUTH EVERY DAY 30 tablet 5   clopidogrel (PLAVIX) 75 MG tablet TAKE ONE TABLET BY MOUTH EVERY DAY 30 tablet 5   ezetimibe (ZETIA) 10 MG tablet TAKE ONE TABLET BY MOUTH EVERY DAY 30 tablet 5   FEROSUL 325 (65 Fe) MG tablet TAKE ONE TABLET BY MOUTH EVERY TWO DAYS with FOUR OUNCE of orange JUICE 100 tablet 0   fluticasone (FLONASE) 50 MCG/ACT nasal spray Place 2 sprays into both nostrils daily. 16 g 6   furosemide (LASIX) 20 MG tablet Take 1 tablet (20 mg total) by mouth daily as needed for edema. 30 tablet 3   GNP PAIN RELIEF EX-STRENGTH 500 MG tablet TAKE ONE TABLET BY MOUTH FOUR TIMES DAILY AS NEEDED  FOR PAIN 120 tablet 0   guaiFENesin (MUCINEX) 600 MG 12 hr tablet Take 1 tablet (600 mg total) by mouth daily as needed for to loosen phlegm. 24 tablet 0   Insulin Aspart FlexPen 100 UNIT/ML SOPN INJECT subcutaneously THREE TIMES DAILY PER sliding scales BLOOD SUGAR (150 NO INSULIN)+ 151-180 Give FOUR units. 181-220 Give SIX units, 221-260 Give EIGHT units, 261-300 Give 10 units 301-340 Give 12 units AND > 341 Give 14 units 15 mL 1   isosorbide mononitrate (IMDUR) 30 MG 24 hr tablet Take 1 tablet (30 mg total) by mouth daily. 30 tablet 5   lamoTRIgine (LAMICTAL) 200 MG tablet TAKE 1.5 TABLETS BY MOUTH TWICE DAILY 270 tablet 3   LANTUS SOLOSTAR 100 UNIT/ML Solostar Pen INJECT 20 units AT BEDTIME 15 mL 4   losartan (COZAAR) 50 MG tablet TAKE ONE TABLET BY MOUTH EVERY DAY 30 tablet 5   metoprolol tartrate (LOPRESSOR) 50 MG tablet TAKE ONE TABLET BY MOUTH TWICE DAILY 60 tablet 5   Omega-3 Fatty Acids (FISH OIL) 1000 MG CAPS TAKE TWO CAPSULES BY MOUTH EVERY DAY 60 capsule 5   phenytoin (DILANTIN) 100 MG ER capsule Take 3 capsules (300 mg total) by mouth daily. 270 capsule 3   phenytoin (DILANTIN) 30 MG ER capsule Take 1 capsule (30 mg total) by mouth at bedtime. 90 capsule 3   polyvinyl alcohol (ARTIFICIAL TEARS) 1.4 % ophthalmic solution Place 1 drop into both eyes in the morning and at bedtime. 15 mL 6   rosuvastatin (CRESTOR) 40 MG tablet TAKE ONE TABLET BY MOUTH EVERY DAY 30 tablet 5   Spacer/Aero Chamber Mouthpiece MISC 1 Device by Does not apply route daily as needed. 1 each 0   SPIRIVA HANDIHALER 18 MCG inhalation capsule place ONE capsule into inhaler AND inhale EVERY DAY 30 capsule 5   spironolactone (ALDACTONE) 50 MG tablet TAKE ONE TABLET BY MOUTH EVERY DAY 30 tablet 5   tiZANidine (ZANAFLEX) 4 MG tablet TAKE 1/2 TABLET BY MOUTH EVERY 12 HOURS AS NEEDED muscle SPASMS 30 tablet 1   No current facility-administered medications on file prior to visit.   Past Medical History:  Diagnosis Date    Allergy  Arthritis    Bipolar 1 disorder (HCC)    Blood transfusion without reported diagnosis    Carotid artery stenosis    50-69% bilateral ICA stenoses by velocity criteria but no visible plaque and ICA/CCA ratio 1.72 on right and 1.38 on left   Coronary artery disease    Depression    Diabetes mellitus without complication (HCC)    Echocardiogram    Echo 06/2018: Hyperdynamic systolic function, EF >65, mild concentric LVH, elevated LVEDP (E/E' suggests impaired relaxation), mild AI, lipomatous interatrial septum   Hypertension    Left adrenal mass (HCC)    2.5 cm L adrenal mass on CT per note of Dr. Ledell Peoples 05/20/2014   MI, old    PAD (peripheral artery disease) (HCC)    03/2014 - CT aortogram with lower extremity runoff (mild to moderate disease in common iliac arteries, complete occulusion of her bilateral external iliac arteries with heavily disease common femoral arteries. She also has disease to her SFAs bilaterally. Popliteal arteries and tibial vessel runoff appears to be adequate)   Seizures (HCC)    Stroke (HCC)    Allergies  Allergen Reactions   Influenza Vaccines Anaphylaxis   Amoxicillin Other (See Comments)    colitis   Augmentin [Amoxicillin-Pot Clavulanate] Other (See Comments)    colitis   Cefadroxil Other (See Comments)    Reported by [facility] 2013 - unknown reaction   Cephalexin Other (See Comments)    colitis   Erythromycin Other (See Comments)    colitis   Niacin And Related Other (See Comments)    colitis   Nitrofurantoin Macrocrystal Other (See Comments)    Reported by [facility] 2013 - unknown reaction   Social History   Socioeconomic History   Marital status: Single    Spouse name: Not on file   Number of children: 2   Years of education: Not on file   Highest education level: Not on file  Occupational History   Not on file  Tobacco Use   Smoking status: Former    Packs/day: 1.00    Types: Cigarettes    Quit date: 2015    Years  since quitting: 8.3   Smokeless tobacco: Never  Vaping Use   Vaping Use: Never used  Substance and Sexual Activity   Alcohol use: No   Drug use: No   Sexual activity: Not on file  Other Topics Concern   Not on file  Social History Narrative   Pt lives in assisted living facility   Has 2 adult children, she is widowed, husband died in his early 40's in a work related accident   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   Social Determinants of Corporate investment banker Strain: Not on file  Food Insecurity: Not on file  Transportation Needs: Not on file  Physical Activity: Not on file  Stress: Not on file  Social Connections: Not on file   Vitals:   05/26/21 1101  BP: 120/80  Pulse: 70  Resp: 16  SpO2: 98%   Body mass index is 24.09 kg/m.  Physical Exam Vitals and nursing note reviewed. Exam conducted with a chaperone present.  Constitutional:      General: She is not in acute distress.    Appearance: She is well-developed.  HENT:     Head: Normocephalic and atraumatic.     Mouth/Throat:     Mouth: Mucous membranes are moist.  Eyes:     Conjunctiva/sclera: Conjunctivae normal.  Cardiovascular:     Rate and Rhythm: Normal rate and regular rhythm.     Heart sounds: No murmur heard. Pulmonary:     Effort: Pulmonary effort is normal. No respiratory distress.     Breath sounds: Normal breath sounds.  Abdominal:     Palpations: Abdomen is soft. There is no mass.     Tenderness: There is no abdominal tenderness.  Genitourinary:    Exam position: Lithotomy position.     Labia:        Right: No rash, tenderness or lesion.        Left: No rash, tenderness or lesion.      Vagina: No vaginal discharge or bleeding.     Rectum: No tenderness or external hemorrhoid.     Comments: Dark red blood on glove after rectal examination.No masses and no tenderness. Skin tags. Skin:    General: Skin is warm.     Findings: No erythema.  Neurological:     Mental Status: She is  alert.     Comments: Oriented in person and place. Unstable gait, she is in a wheel chair today.  Psychiatric:        Mood and Affect: Mood and affect normal.   ASSESSMENT AND PLAN:  Ms.Leonard was seen today for bleeding.  Diagnoses and all orders for this visit: Orders Placed This Encounter  Procedures   Comprehensive metabolic panel   CBC   Ambulatory referral to Gastroenterology   Lab Results  Component Value Date   CREATININE 1.49 (H) 05/26/2021   BUN 15 05/26/2021   NA 129 (L) 05/26/2021   K 4.9 05/26/2021   CL 100 05/26/2021   CO2 25 05/26/2021   Lab Results  Component Value Date   ALT 18 05/26/2021   AST 22 05/26/2021   ALKPHOS 82 05/26/2021   BILITOT 0.2 05/26/2021   Lab Results  Component Value Date   WBC 7.8 05/26/2021   HGB 8.4 Repeated and verified X2. (L) 05/26/2021   HCT 24.9 (L) 05/26/2021   MCV 86.5 05/26/2021   PLT 389.0 05/26/2021   Rectal bleeding We discussed possible causes, including internal hemorrhoids and the probability of a more serious process. She is not interested in treatment or invasive procedures but agrees with GI evaluation. Hold Plavix. If not resolved, we will need to also discontinue Aspirin. Last colonoscopy in 01/2011.  Clearly instructed about warning signs.  Coronary artery disease involving native coronary artery of native heart with angina pectoris (HCC) Continue Crestor,Zetia,Imdur,and Metoprolol tartrate same dose. Continue Aspirin 81 mg daily. Follows with cardiologist, last visit 11/2020.  PAD (peripheral artery disease) (HCC) Continue Crestor 40 mg daily,Zetia 10 mg daily,and Aspirin 81 mg daily.   Anemia, unspecified type Iron def and chronic disease. Continue Fe Sulfate 325 mg every other day.  I spent a total of 44 minutes in both face to face and non face to face activities for this visit on the date of this encounter. During this time history was obtained and documented, examination was performed, prior  labs/imaging reviewed, and assessment/plan discussed.  Return if symptoms worsen or fail to improve, for Keep next appt.  Biannca Scantlin G. [place], MD  [facility]. [place] office.

## 2021-05-26 ENCOUNTER — Ambulatory Visit (INDEPENDENT_AMBULATORY_CARE_PROVIDER_SITE_OTHER): Payer: HMO | Admitting: Family Medicine

## 2021-05-26 ENCOUNTER — Encounter: Payer: Self-pay | Admitting: Family Medicine

## 2021-05-26 VITALS — BP 120/80 | HR 70 | Resp 16 | Ht 63.0 in

## 2021-05-26 DIAGNOSIS — I25119 Atherosclerotic heart disease of native coronary artery with unspecified angina pectoris: Secondary | ICD-10-CM | POA: Diagnosis not present

## 2021-05-26 DIAGNOSIS — K625 Hemorrhage of anus and rectum: Secondary | ICD-10-CM | POA: Diagnosis not present

## 2021-05-26 DIAGNOSIS — I739 Peripheral vascular disease, unspecified: Secondary | ICD-10-CM | POA: Diagnosis not present

## 2021-05-26 DIAGNOSIS — D649 Anemia, unspecified: Secondary | ICD-10-CM | POA: Diagnosis not present

## 2021-05-26 LAB — CBC
HCT: 24.9 % — ABNORMAL LOW (ref 36.0–46.0)
Hemoglobin: 8.4 g/dL — ABNORMAL LOW (ref 12.0–15.0)
MCHC: 33.9 g/dL (ref 30.0–36.0)
MCV: 86.5 fl (ref 78.0–100.0)
Platelets: 389 10*3/uL (ref 150.0–400.0)
RBC: 2.88 Mil/uL — ABNORMAL LOW (ref 3.87–5.11)
RDW: 14.3 % (ref 11.5–15.5)
WBC: 7.8 10*3/uL (ref 4.0–10.5)

## 2021-05-26 LAB — COMPREHENSIVE METABOLIC PANEL
ALT: 18 U/L (ref 0–35)
AST: 22 U/L (ref 0–37)
Albumin: 3.7 g/dL (ref 3.5–5.2)
Alkaline Phosphatase: 82 U/L (ref 39–117)
BUN: 15 mg/dL (ref 6–23)
CO2: 25 mEq/L (ref 19–32)
Calcium: 9.7 mg/dL (ref 8.4–10.5)
Chloride: 100 mEq/L (ref 96–112)
Creatinine, Ser: 1.49 mg/dL — ABNORMAL HIGH (ref 0.40–1.20)
GFR: 34.15 mL/min — ABNORMAL LOW (ref >60.00)
Glucose, Bld: 137 mg/dL — ABNORMAL HIGH (ref 70–99)
Potassium: 4.9 mEq/L (ref 3.5–5.1)
Sodium: 129 mEq/L — ABNORMAL LOW (ref 135–145)
Total Bilirubin: 0.2 mg/dL (ref 0.2–1.2)
Total Protein: 9.1 g/dL — ABNORMAL HIGH (ref 6.0–8.3)

## 2021-05-26 NOTE — Patient Instructions (Addendum)
A few things to remember from today's visit: ? ?Rectal bleeding - Plan: CBC, Comprehensive metabolic panel, Ambulatory referral to Gastroenterology ? ?If you need refills please call your pharmacy. ?Do not use My Chart to request refills or for acute issues that need immediate attention. ?  ?Avoid straining or constipation. ?Continue adequate fiber and fluid intake. ?Recommend stopping Plavix. ?Continue aspirin for now. ?If suddenly worse seek immediate medical attention. ? ?Please be sure medication list is accurate. ?If a new problem present, please set up appointment sooner than planned today. ? ? ? ? ? ? ? ?

## 2021-05-28 ENCOUNTER — Other Ambulatory Visit: Payer: Self-pay | Admitting: Family Medicine

## 2021-05-28 ENCOUNTER — Encounter: Payer: Self-pay | Admitting: Family Medicine

## 2021-06-01 DIAGNOSIS — F0390 Unspecified dementia without behavioral disturbance: Secondary | ICD-10-CM | POA: Diagnosis not present

## 2021-06-01 DIAGNOSIS — K625 Hemorrhage of anus and rectum: Secondary | ICD-10-CM | POA: Diagnosis not present

## 2021-06-01 DIAGNOSIS — D500 Iron deficiency anemia secondary to blood loss (chronic): Secondary | ICD-10-CM | POA: Diagnosis not present

## 2021-06-01 DIAGNOSIS — E119 Type 2 diabetes mellitus without complications: Secondary | ICD-10-CM | POA: Diagnosis not present

## 2021-06-11 ENCOUNTER — Emergency Department (HOSPITAL_BASED_OUTPATIENT_CLINIC_OR_DEPARTMENT_OTHER): Payer: HMO

## 2021-06-11 ENCOUNTER — Encounter (HOSPITAL_BASED_OUTPATIENT_CLINIC_OR_DEPARTMENT_OTHER): Payer: Self-pay | Admitting: *Deleted

## 2021-06-11 ENCOUNTER — Other Ambulatory Visit: Payer: Self-pay

## 2021-06-11 ENCOUNTER — Emergency Department (HOSPITAL_BASED_OUTPATIENT_CLINIC_OR_DEPARTMENT_OTHER)
Admission: EM | Admit: 2021-06-11 | Discharge: 2021-06-11 | Disposition: A | Discharge status: Home / Self Care | Payer: HMO | Attending: Emergency Medicine | Admitting: Emergency Medicine

## 2021-06-11 DIAGNOSIS — I2510 Atherosclerotic heart disease of native coronary artery without angina pectoris: Secondary | ICD-10-CM | POA: Diagnosis not present

## 2021-06-11 DIAGNOSIS — I10 Essential (primary) hypertension: Secondary | ICD-10-CM | POA: Insufficient documentation

## 2021-06-11 DIAGNOSIS — W010XXA Fall on same level from slipping, tripping and stumbling without subsequent striking against object, initial encounter: Secondary | ICD-10-CM | POA: Diagnosis not present

## 2021-06-11 DIAGNOSIS — Z043 Encounter for examination and observation following other accident: Secondary | ICD-10-CM | POA: Diagnosis not present

## 2021-06-11 DIAGNOSIS — Z794 Long term (current) use of insulin: Secondary | ICD-10-CM | POA: Diagnosis not present

## 2021-06-11 DIAGNOSIS — Z79899 Other long term (current) drug therapy: Secondary | ICD-10-CM | POA: Insufficient documentation

## 2021-06-11 DIAGNOSIS — W19XXXA Unspecified fall, initial encounter: Secondary | ICD-10-CM | POA: Insufficient documentation

## 2021-06-11 DIAGNOSIS — Z7982 Long term (current) use of aspirin: Secondary | ICD-10-CM | POA: Diagnosis not present

## 2021-06-11 DIAGNOSIS — S0990XA Unspecified injury of head, initial encounter: Secondary | ICD-10-CM | POA: Insufficient documentation

## 2021-06-11 DIAGNOSIS — M542 Cervicalgia: Secondary | ICD-10-CM | POA: Diagnosis not present

## 2021-06-11 NOTE — ED Provider Notes (Signed)
[facility] EMERGENCY DEPT Provider Note   CSN: [id number] Arrival date & time: 06/11/21  1700     History  Chief Complaint  Patient presents with   [facility] Maydelin Deming is a 76 y.o. female.  Patient is a 76 year old female who presents after a fall.  She has a history of prior stroke, coronary artery disease, hypertension, PAD, bipolar disorder.  [facility]  History is obtained from the patient and the patient's daughter who is at bedside.  The daughter has power of attorney.  She states that patient was transferring from a chair and fell, striking her head on the ground.  There is no reported loss of consciousness.  She has not had any nausea or vomiting.  She does have some pain in the back of her head.  She also has some neck pain.  She has had prior neck surgery.  She denies any other injuries from the fall.  No numbness or weakness in her extremities other than she has some chronic weakness of her left arm from the prior stroke.  She is not on anticoagulants.  Her daughter states that she was previously on Plavix but has subsequently been taken off.      Home Medications Prior to Admission medications   Medication Sig Start Date End Date Taking? Authorizing Provider  albuterol (VENTOLIN HFA) 108 (90 Base) MCG/ACT inhaler Inhale 2 puffs into the lungs every 6 (six) hours as needed for wheezing or shortness of breath. 11/28/20   [place], Betty G, MD  amLODipine (NORVASC) 5 MG tablet Take 1 tablet (5 mg total) by mouth daily. 04/01/21   [place], Betty G, MD  ASPIRIN LOW DOSE 81 MG EC tablet TAKE ONE TABLET BY MOUTH EVERY DAY 04/27/21   [place], Betty G, MD  Cholecalciferol (VITAMIN D3 PO) Take 8,000 Units by mouth daily.    [provider]  Cholecalciferol (VITAMIN D3) 50 MCG (2000 UT) TABS TAKE ONE TABLET BY MOUTH EVERY DAY 03/18/21   [place], Betty G, MD  clopidogrel (PLAVIX) 75 MG tablet TAKE ONE TABLET BY MOUTH EVERY DAY 03/18/21    [place], Betty G, MD  ezetimibe (ZETIA) 10 MG tablet TAKE ONE TABLET BY MOUTH EVERY DAY 03/18/21   [place], Betty G, MD  FEROSUL 325 (65 Fe) MG tablet TAKE ONE TABLET BY MOUTH EVERY TWO DAYS with FOUR OUNCE of orange JUICE 02/23/21   [place], Betty G, MD  fluticasone [facility]) 50 MCG/ACT nasal spray Place 2 sprays into both nostrils daily. 04/22/17   Nafziger, Tommi Rumps, NP  furosemide (LASIX) 20 MG tablet Take 1 tablet (20 mg total) by mouth daily as needed for edema. 11/28/20   [place], Betty G, MD  GNP PAIN RELIEF EX-STRENGTH 500 MG tablet TAKE ONE TABLET BY MOUTH FOUR TIMES DAILY AS NEEDED FOR PAIN 12/12/20   [place], Betty G, MD  guaiFENesin (MUCINEX) 600 MG 12 hr tablet Take 1 tablet (600 mg total) by mouth daily as needed for to loosen phlegm. 12/12/20   [place], Betty G, MD  Insulin Aspart FlexPen 100 UNIT/ML SOPN INJECT subcutaneously THREE TIMES DAILY PER sliding scales BLOOD SUGAR (150 NO INSULIN)+ 151-180 Give FOUR units. 181-220 Give SIX units, 221-260 Give EIGHT units, 261-300 Give 10 units 301-340 Give 12 units AND > 341 Give 14 units 11/26/19   [place], Betty G, MD  isosorbide mononitrate (IMDUR) 30 MG 24 hr tablet Take 1 tablet (30 mg total) by mouth daily. 04/02/21   [place],  Malka So, MD  lamoTRIgine (LAMICTAL) 200 MG tablet TAKE 1.5 TABLETS BY MOUTH TWICE DAILY 10/21/20   Cameron Sprang, MD  LANTUS SOLOSTAR 100 UNIT/ML Solostar Pen INJECT 20 units AT BEDTIME 09/03/20   [place], Betty G, MD  losartan (COZAAR) 50 MG tablet TAKE ONE TABLET BY MOUTH EVERY DAY 03/26/21   [place], Betty G, MD  metoprolol tartrate (LOPRESSOR) 50 MG tablet TAKE ONE TABLET BY MOUTH TWICE DAILY 05/29/21   [place], Betty G, MD  Omega-3 Fatty Acids ([place]) 1000 MG CAPS TAKE TWO CAPSULES BY MOUTH EVERY DAY 09/26/20   [place], Betty G, MD  phenytoin (DILANTIN) 100 MG ER capsule Take 3 capsules (300 mg total) by mouth daily. 10/21/20   Cameron Sprang, MD  phenytoin (DILANTIN) 30 MG ER capsule Take 1 capsule (30 mg total) by mouth at bedtime.  10/21/20   Cameron Sprang, MD  polyvinyl alcohol (ARTIFICIAL TEARS) 1.4 % ophthalmic solution Place 1 drop into both eyes in the morning and at bedtime. 04/22/21   [place], Betty G, MD  rosuvastatin (CRESTOR) 40 MG tablet TAKE ONE TABLET BY MOUTH EVERY DAY 01/12/21   [place], Betty G, MD  Spacer/Aero Chamber Mouthpiece MISC 1 Device by Does not apply route daily as needed. 12/24/16   Rigoberto Noel, MD  SPIRIVA HANDIHALER 18 MCG inhalation capsule place ONE capsule into inhaler AND inhale EVERY DAY 09/05/20   [place], Betty G, MD  spironolactone (ALDACTONE) 50 MG tablet TAKE ONE TABLET BY MOUTH EVERY DAY 03/23/21   [place], Betty G, MD  tiZANidine (ZANAFLEX) 4 MG tablet TAKE 1/2 TABLET BY MOUTH EVERY 12 HOURS AS NEEDED muscle SPASMS 05/04/21   [place], Betty G, MD      Allergies    Influenza vaccines, Amoxicillin, Augmentin [amoxicillin-pot clavulanate], Cefadroxil, Cephalexin, Erythromycin, Niacin and related, and Nitrofurantoin macrocrystal    Review of Systems   Review of Systems  Constitutional:  Negative for chills, diaphoresis, fatigue and fever.  HENT:  Negative for congestion, rhinorrhea and sneezing.   Eyes: Negative.   Respiratory:  Negative for cough, chest tightness and shortness of breath.   Cardiovascular:  Negative for chest pain and leg swelling.  Gastrointestinal:  Negative for abdominal pain, blood in stool, diarrhea, nausea and vomiting.  Genitourinary:  Negative for difficulty urinating, flank pain, frequency and hematuria.  Musculoskeletal:  Positive for neck pain. Negative for arthralgias and back pain.  Skin:  Negative for rash.  Neurological:  Positive for headaches. Negative for dizziness, speech difficulty, weakness and numbness.   Physical Exam Updated Vital Signs BP (!) 182/68 (BP Location: Left Arm)   Pulse 70   Temp 98.2 F (36.8 C)   Resp 18   LMP 03/17/2015   SpO2 100%  Physical Exam Constitutional:      Appearance: She is well-developed.  HENT:     Head:  Normocephalic and atraumatic.  Eyes:     Pupils: Pupils are equal, round, and reactive to light.  Neck:     Comments: Positive tenderness throughout the cervical spine, no pain to the thoracic or lumbosacral spine.  No step-offs or deformities. Cardiovascular:     Rate and Rhythm: Normal rate and regular rhythm.     Heart sounds: Normal heart sounds.  Pulmonary:     Effort: Pulmonary effort is normal. No respiratory distress.     Breath sounds: Normal breath sounds. No wheezing or rales.  Chest:     Chest wall: No tenderness.  Abdominal:  General: Bowel sounds are normal.     Palpations: Abdomen is soft.     Tenderness: There is no abdominal tenderness. There is no guarding or rebound.  Musculoskeletal:        General: Normal range of motion.     Comments: No pain with palpation or range of motion extremities  Lymphadenopathy:     Cervical: No cervical adenopathy.  Skin:    General: Skin is warm and dry.     Findings: No rash.  Neurological:     Mental Status: She is alert.     Comments: Oriented to person and place, baseline mental status per daughter    ED Results / Procedures / Treatments   Labs (all labs ordered are listed, but only abnormal results are displayed) Labs Reviewed - No data to display  EKG None  Radiology CT Head Wo Contrast  Result Date: 06/11/2021 CLINICAL DATA:  Fall EXAM: CT HEAD WITHOUT CONTRAST TECHNIQUE: Contiguous axial images were obtained from the base of the skull through the vertex without intravenous contrast. RADIATION DOSE REDUCTION: This exam was performed according to the departmental dose-optimization program which includes automated exposure control, adjustment of the mA and/or kV according to patient size and/or use of iterative reconstruction technique. COMPARISON:  03/12/2020 FINDINGS: Brain: Redemonstrated encephalomalacia in the right PCA territory. No acute infarct, hemorrhage, mass, mass effect, or midline shift. Ex vacuo  dilatation of the right lateral ventricle. No hydrocephalus or extra-axial collection. Periventricular white matter changes, likely the sequela of chronic small vessel ischemic disease. Vascular: Hyperdense vessel. Atherosclerotic calcifications in the intracranial carotid and vertebral arteries. Skull: No acute fracture or suspicious osseous lesion. Sinuses/Orbits: No acute finding. Status post bilateral lens replacements. Other: The mastoids are well aerated. IMPRESSION: No acute intracranial process. Electronically Signed   By: Merilyn Baba M.D.   On: 06/11/2021 19:23   CT Cervical Spine Wo Contrast  Result Date: 06/11/2021 CLINICAL DATA:  Fall EXAM: CT CERVICAL SPINE WITHOUT CONTRAST TECHNIQUE: Multidetector CT imaging of the cervical spine was performed without intravenous contrast. Multiplanar CT image reconstructions were also generated. RADIATION DOSE REDUCTION: This exam was performed according to the departmental dose-optimization program which includes automated exposure control, adjustment of the mA and/or kV according to patient size and/or use of iterative reconstruction technique. COMPARISON:  12/18/2017 FINDINGS: Alignment: Straightening of the normal cervical lordosis. 3 mm anterolisthesis C3 on C4, unchanged from 2019. Skull base and vertebrae: No acute fracture or suspicious osseous lesion. ACDF C5-C6 with solid arthrodesis. Degenerative narrowing of the atlanto dental interval. Osseous fusion of the left-greater-than-right facets at C2-C3 Soft tissues and spinal canal: No prevertebral fluid or swelling. No visible canal hematoma. Disc levels: Mild spinal canal stenosis C5-C6, secondary to osseous hypertrophy of the posterior aspect of these vertebral bodies. Multilevel uncovertebral and facet arthropathy, which causes severe neural foraminal narrowing bilaterally at C3-C4 and C4-C5, and on the right at C5-C6. Upper chest: No focal pulmonary opacity or pleural effusion. Other: None.  IMPRESSION: No acute fracture or traumatic listhesis in the cervical spine. Electronically Signed   By: Merilyn Baba M.D.   On: 06/11/2021 19:07    Procedures Procedures    Medications Ordered in ED Medications - No data to display  ED Course/ Medical Decision Making/ A&P                           Medical Decision Making Amount and/or Complexity of Data Reviewed Radiology: ordered.  Patient is a 76 year old female who presents after mechanical fall.  She has some pain to the back of her head and neck.  No other apparent injuries.  She is neurologically intact other than some baseline deficits.  She had a CT scan of her head and cervical spine which showed no acute abnormalities.  No intracranial hemorrhage.  No visible fractures.  She does not have radicular symptoms.  No other apparent injuries.  She is at her baseline mental status.  She was discharged home in good condition.  She was discharged with her daughter.  They were encouraged to follow-up with her PCP.  Return precautions were given.  Final Clinical Impression(s) / ED Diagnoses Final diagnoses:  Fall, initial encounter  Minor head injury, initial encounter  Acute neck pain    Rx / DC Orders ED Discharge Orders     None         Malvin Johns, MD 06/11/21 2002

## 2021-06-11 NOTE — ED Notes (Signed)
Pt refusing to wear c-collar correctly. Pt informed the reason to be wear the collar. Pt still didn't want to wear the collar correctly.

## 2021-06-11 NOTE — ED Triage Notes (Signed)
Pt arrives POV from [facility] with her daughter Rema Jasmine is her POA. Pt was transferring from wheelchair to bed when she fell to the ground.  Pt struck her head on the ground.  No LOC.  Pt reports pain in neck.  Cervical collar applied.  Pt is not on blood thinners

## 2021-06-16 ENCOUNTER — Ambulatory Visit (INDEPENDENT_AMBULATORY_CARE_PROVIDER_SITE_OTHER): Payer: HMO

## 2021-06-16 DIAGNOSIS — G40909 Epilepsy, unspecified, not intractable, without status epilepticus: Secondary | ICD-10-CM

## 2021-06-16 DIAGNOSIS — I119 Hypertensive heart disease without heart failure: Secondary | ICD-10-CM

## 2021-06-16 DIAGNOSIS — E1149 Type 2 diabetes mellitus with other diabetic neurological complication: Secondary | ICD-10-CM

## 2021-06-16 DIAGNOSIS — F0390 Unspecified dementia without behavioral disturbance: Secondary | ICD-10-CM

## 2021-06-16 DIAGNOSIS — J449 Chronic obstructive pulmonary disease, unspecified: Secondary | ICD-10-CM

## 2021-06-16 DIAGNOSIS — E1169 Type 2 diabetes mellitus with other specified complication: Secondary | ICD-10-CM

## 2021-06-16 NOTE — Chronic Care Management (AMB) (Signed)
Chronic Care Management   CCM RN Visit Note  06/16/2021 Name: Kathryn Lewis MRN: [id number] DOB: [birth year]  Subjective: Kathryn Lewis is a 76 y.o. year old female who is a primary care patient of [place], Malka So, MD. The care management team was consulted for assistance with disease management and care coordination needs.    Engaged with patient by telephone for follow up visit in response to provider referral for case management and/or care coordination services.   Consent to Services:  The patient was given information about Chronic Care Management services, agreed to services, and gave verbal consent prior to initiation of services.  Please see initial visit note for detailed documentation.   Patient agreed to services and verbal consent obtained.   Assessment: Review of patient past medical history, allergies, medications, health status, including review of consultants reports, laboratory and other test data, was performed as part of comprehensive evaluation and provision of chronic care management services.   SDOH (Social Determinants of Health) assessments and interventions performed:    CCM Care Plan  Allergies  Allergen Reactions   Influenza Vaccines Anaphylaxis   Amoxicillin Other (See Comments)    colitis   Augmentin [Amoxicillin-Pot Clavulanate] Other (See Comments)    colitis   Cefadroxil Other (See Comments)    Reported by [facility] 2013 - unknown reaction   Cephalexin Other (See Comments)    colitis   Erythromycin Other (See Comments)    colitis   Niacin And Related Other (See Comments)    colitis   Nitrofurantoin Macrocrystal Other (See Comments)    Reported by [facility] 2013 - unknown reaction    Outpatient Encounter Medications as of 06/16/2021  Medication Sig   albuterol (VENTOLIN HFA) 108 (90 Base) MCG/ACT inhaler Inhale 2 puffs into the lungs every 6 (six) hours as needed for wheezing or shortness of breath.   amLODipine (NORVASC) 5 MG  tablet Take 1 tablet (5 mg total) by mouth daily.   ASPIRIN LOW DOSE 81 MG EC tablet TAKE ONE TABLET BY MOUTH EVERY DAY   Cholecalciferol (VITAMIN D3 PO) Take 8,000 Units by mouth daily.   Cholecalciferol (VITAMIN D3) 50 MCG (2000 UT) TABS TAKE ONE TABLET BY MOUTH EVERY DAY   clopidogrel (PLAVIX) 75 MG tablet TAKE ONE TABLET BY MOUTH EVERY DAY   ezetimibe (ZETIA) 10 MG tablet TAKE ONE TABLET BY MOUTH EVERY DAY   FEROSUL 325 (65 Fe) MG tablet TAKE ONE TABLET BY MOUTH EVERY TWO DAYS with FOUR OUNCE of orange JUICE   fluticasone (FLONASE) 50 MCG/ACT nasal spray Place 2 sprays into both nostrils daily.   furosemide (LASIX) 20 MG tablet Take 1 tablet (20 mg total) by mouth daily as needed for edema.   GNP PAIN RELIEF EX-STRENGTH 500 MG tablet TAKE ONE TABLET BY MOUTH FOUR TIMES DAILY AS NEEDED FOR PAIN   guaiFENesin (MUCINEX) 600 MG 12 hr tablet Take 1 tablet (600 mg total) by mouth daily as needed for to loosen phlegm.   Insulin Aspart FlexPen 100 UNIT/ML SOPN INJECT subcutaneously THREE TIMES DAILY PER sliding scales BLOOD SUGAR (150 NO INSULIN)+ 151-180 Give FOUR units. 181-220 Give SIX units, 221-260 Give EIGHT units, 261-300 Give 10 units 301-340 Give 12 units AND > 341 Give 14 units   isosorbide mononitrate (IMDUR) 30 MG 24 hr tablet Take 1 tablet (30 mg total) by mouth daily.   lamoTRIgine (LAMICTAL) 200 MG tablet TAKE 1.5 TABLETS BY MOUTH TWICE DAILY   LANTUS SOLOSTAR 100 UNIT/ML Solostar  Pen INJECT 20 units AT BEDTIME   losartan (COZAAR) 50 MG tablet TAKE ONE TABLET BY MOUTH EVERY DAY   metoprolol tartrate (LOPRESSOR) 50 MG tablet TAKE ONE TABLET BY MOUTH TWICE DAILY   Omega-3 Fatty Acids (FISH OIL) 1000 MG CAPS TAKE TWO CAPSULES BY MOUTH EVERY DAY   phenytoin (DILANTIN) 100 MG ER capsule Take 3 capsules (300 mg total) by mouth daily.   phenytoin (DILANTIN) 30 MG ER capsule Take 1 capsule (30 mg total) by mouth at bedtime.   polyvinyl alcohol (ARTIFICIAL TEARS) 1.4 % ophthalmic solution Place  1 drop into both eyes in the morning and at bedtime.   rosuvastatin (CRESTOR) 40 MG tablet TAKE ONE TABLET BY MOUTH EVERY DAY   Spacer/Aero Chamber Mouthpiece MISC 1 Device by Does not apply route daily as needed.   SPIRIVA HANDIHALER 18 MCG inhalation capsule place ONE capsule into inhaler AND inhale EVERY DAY   spironolactone (ALDACTONE) 50 MG tablet TAKE ONE TABLET BY MOUTH EVERY DAY   tiZANidine (ZANAFLEX) 4 MG tablet TAKE 1/2 TABLET BY MOUTH EVERY 12 HOURS AS NEEDED muscle SPASMS   No facility-administered encounter medications on file as of 06/16/2021.    Patient Active Problem List   Diagnosis Date Noted   Dementia without behavioral disturbance ([place]) 01/23/2019   Peripheral polyneuropathy 10/24/2018   PAD (peripheral artery disease) ([place]) 04/05/2018   Carotid artery disease ([place]) 04/05/2018   Hypokalemia 02/27/2018   Frequent falls 07/08/2017   Back pain 07/08/2017   Mixed hyperlipidemia 05/27/2017   Unstable gait 01/10/2017   Hyperlipidemia associated with type 2 diabetes mellitus ([place]) 01/10/2017   OSA on CPAP 12/24/2016   COPD (chronic obstructive pulmonary disease) ([place]) 12/24/2016   Pulmonary nodule 12/24/2016   Generalized weakness 12/12/2015   Diabetes mellitus type 2 with neurological manifestations ([place]) 12/12/2015   Hypertension with heart disease 12/12/2015   Seizure disorder as sequela of cerebrovascular accident ([place]) 04/17/2015   CAD (coronary artery disease) 04/17/2015   Anemia, unspecified 03/02/2010   Cough 03/02/2010   Depressive disorder, not elsewhere classified 03/02/2010   Pneumonia, organism unspecified([place]) 03/02/2010   Shortness of breath 03/02/2010   Type II diabetes mellitus with neurological manifestations ([place]) 03/02/2010   Wheezing 03/02/2010   Chronic airway obstruction, not elsewhere classified 03/02/2010   Essential hypertension 03/02/2010   Acute, but ill-defined, cerebrovascular disease 03/02/2010    Conditions to be  addressed/monitored:HTN, HLD, COPD, DMII, Dementia, and seizures  Care Plan : RN Care Manager Plan of Care  Updates made by Dimitri Ped, RN since 06/16/2021 12:00 AM     Problem: Chronic Disease Management and Care Coordination Needs (DM. COPD, seizures, dementia,HTN, HLD)   Priority: High     Long-Range Goal: Establish Plan of Care for Chronic Disease Management Needs (DM. COPD, seizures, dementia,HTN, HLD)   Start Date: 12/23/2020  Expected End Date: 06/16/2022  Priority: High  Note:   Current Barriers:  Care Coordination needs related to ADL IADL limitations, Memory Deficits, Inability to perform ADL's independently, and Inability to perform IADL's independently Chronic Disease Management support and education needs related to HTN, HLD, COPD, DMII, Dementia, and Seizures  Cognitive Deficits Pt lives at [place] with daughter Malea Swilling.  States pt still has poor safety awareness and need to be reminded to not get up quickly and to use her walker.  States pt had a fall recently and hit her head and was seen at the ED.  States she did not lock her brakes  when transferring.  States her neck is feeling better now. Facility assists pt with medications, CBG checks and IADLS.  States pt's appetite is good and she will eat sweets if offered to her at her residence.  States she get confused at times especially in the evening.  States her CBG are good but she does not know the numbers.  Denies any low or really high readings.  States her B/P have been good. Denies any shortness of breath or coughing. States that [facility] and  palliative care are seeing pt.  RNCM Clinical Goal(s):  Patient will verbalize basic understanding of  HTN, HLD, COPD, DMII, Dementia, and seizures disease process and self health management plan as evidenced by voiced adherence to plan of care take all medications exactly as prescribed and will call provider for medication related  questions as evidenced by dispense report and pt verbalization attend all scheduled medical appointments: no up coming scheduled as evidenced by review of medical record demonstrate Improved adherence to prescribed treatment plan for HTN, HLD, COPD, DMII, Dementia, and seizures as evidenced by voiced adherence to plan of care continue to work with RN Care Manager to address care management and care coordination needs related to  HTN, HLD, COPD, DMII, Dementia, and seizures as evidenced by adherence to CM Team Scheduled appointments through collaboration with RN Care manager, provider, and care team.   Interventions: 1:1 collaboration with primary care provider regarding development and update of comprehensive plan of care as evidenced by provider attestation and co-signature Inter-disciplinary care team collaboration (see longitudinal plan of care) Evaluation of current treatment plan related to  self management and patient's adherence to plan as established by provider   COPD Interventions:  (Status:  Goal on track:  Yes.) Long Term Goal Advised patient to track and manage COPD triggers Provided instruction about proper use of medications used for management of COPD including inhalers Advised patient to self assesses COPD action plan zone and make appointment with provider if in the yellow zone for 48 hours without improvement Provided education about and advised patient to utilize infection prevention strategies to reduce risk of respiratory infection Discussed the importance of adequate rest and management of fatigue with COPD   Diabetes Interventions:  (Status:  Goal on track:  Yes.) Long Term Goal Assessed patient's understanding of A1c goal: <7% Provided education to patient about basic DM disease process Reviewed medications with patient and discussed importance of medication adherence Counseled on importance of regular laboratory monitoring as prescribed Provided patient with written  educational materials related to hypo and hyperglycemia and importance of correct treatment Advised patient, providing education and rationale, to check cbg daily and record, calling provider for findings outside established parameters Review of patient status, including review of consultants reports, relevant laboratory and other test results, and medications completed Reinforced to try to encourage pt to limit concentrated sweets Lab Results  Component Value Date   HGBA1C 5.9 01/30/2021   Falls Interventions:  (Status:  Goal on track:  NO.) Long Term Goal Reviewed medications and discussed potential side effects of medications such as dizziness and frequent urination Advised patient of importance of notifying provider of falls Assessed for falls since last encounter Assessed patients knowledge of fall risk prevention secondary to previously provided education Assessed working status of life alert bracelet and patient adherence Reviewed to reinforce to pt to lock her brakes when transferring. Reinforced safety precautions with seizures and to encourage to use walker at all times   Hyperlipidemia Interventions:  (  Status:  Condition stable.  Not addressed this visit.) Long Term Goal Medication review performed; medication list updated in electronic medical record.  Provider established cholesterol goals reviewed Counseled on importance of regular laboratory monitoring as prescribed Reviewed importance of limiting foods high in cholesterol  Hypertension Interventions:  (Status:  Goal on track:  Yes.) Long Term Goal Last practice recorded BP readings:  BP Readings from Last 3 Encounters:  06/11/21 (!) 166/70  05/26/21 120/80  01/30/21 128/80  Most recent eGFR/CrCl: No results found for: EGFR  No components found for: CRCL  Evaluation of current treatment plan related to hypertension self management and patient's adherence to plan as established by provider Provided education to patient  re: stroke prevention, s/s of heart attack and stroke Reviewed medications with patient and discussed importance of compliance Advised patient, providing education and rationale, to monitor blood pressure daily and record, calling PCP for findings outside established parameters Provided education on prescribed diet low sodium low CHO Reinforced to notify provider for elevated B/P readings  Patient Goals/Self-Care Activities: Take all medications as prescribed Attend all scheduled provider appointments Call pharmacy for medication refills 3-7 days in advance of running out of medications Call provider office for new concerns or questions  schedule appointment with eye doctor check blood sugar at prescribed times: once daily and when you have symptoms of low or high blood sugar check feet daily for cuts, sores or redness take the blood sugar log to all doctor visits manage portion size switch to sugar-free drinks keep feet up while sitting identify and remove indoor air pollutants limit outdoor activity during cold weather do breathing exercises every day follow rescue plan if symptoms flare-up get at least 7 to 8 hours of sleep at night check blood pressure daily take blood pressure log to all doctor appointments call doctor for signs and symptoms of high blood pressure take medications for blood pressure exactly as prescribed call for medicine refill 2 or 3 days before it runs out take all medications exactly as prescribed call doctor with any symptoms you believe are related to your medicine  Follow Up Plan:  Telephone follow up appointment with care management team member scheduled for:  09/17/21 The patient has been provided with contact information for the care management team and has been advised to call with any health related questions or concerns.       Plan:Telephone follow up appointment with care management team member scheduled for:  09/17/21 The patient has been  provided with contact information for the care management team and has been advised to call with any health related questions or concerns.  Peter Garter RN, Jackquline [place], CDE Care Management Coordinator [facility] [phone]

## 2021-06-16 NOTE — Patient Instructions (Signed)
Visit Information  Thank you for taking time to visit with me today. Please don't hesitate to contact me if I can be of assistance to you before our next scheduled telephone appointment.  Following are the goals we discussed today:  Take all medications as prescribed Attend all scheduled provider appointments Call pharmacy for medication refills 3-7 days in advance of running out of medications Call provider office for new concerns or questions  schedule appointment with eye doctor check blood sugar at prescribed times: once daily and when you have symptoms of low or high blood sugar check feet daily for cuts, sores or redness take the blood sugar log to all doctor visits manage portion size switch to sugar-free drinks keep feet up while sitting identify and remove indoor air pollutants limit outdoor activity during cold weather do breathing exercises every day follow rescue plan if symptoms flare-up get at least 7 to 8 hours of sleep at night check blood pressure daily take blood pressure log to all doctor appointments call doctor for signs and symptoms of high blood pressure take medications for blood pressure exactly as prescribed call for medicine refill 2 or 3 days before it runs out take all medications exactly as prescribed call doctor with any symptoms you believe are related to your medicine  Our next appointment is by telephone on 09/17/21 at 2:15 PM  Please call the care guide team at [phone] if you need to cancel or reschedule your appointment.   If you are experiencing a Mental Health or [place] or need someone to talk to, please call the Suicide and Crisis Lifeline: 988 call the [place] National Suicide Prevention Lifeline: (671)539-3493 or TTY: (336)735-2925 TTY (609)717-7738) to talk to a trained counselor call 1-800-273-TALK (toll free, 24 hour hotline) go to [facility] [address], [place]  [phone]) call 911   Patient verbalizes understanding of instructions and care plan provided today and agrees to view in [place]. Active MyChart status and patient understanding of how to access instructions and care plan via MyChart confirmed with patient.     Peter Garter RN, Jackquline [place], CDE Care Management Coordinator [facility] [phone]

## 2021-06-24 DIAGNOSIS — J449 Chronic obstructive pulmonary disease, unspecified: Secondary | ICD-10-CM | POA: Diagnosis not present

## 2021-06-24 DIAGNOSIS — Z87891 Personal history of nicotine dependence: Secondary | ICD-10-CM

## 2021-06-24 DIAGNOSIS — I10 Essential (primary) hypertension: Secondary | ICD-10-CM

## 2021-06-24 DIAGNOSIS — E1169 Type 2 diabetes mellitus with other specified complication: Secondary | ICD-10-CM

## 2021-06-24 DIAGNOSIS — E785 Hyperlipidemia, unspecified: Secondary | ICD-10-CM | POA: Diagnosis not present

## 2021-07-07 ENCOUNTER — Other Ambulatory Visit: Payer: Self-pay | Admitting: Family Medicine

## 2021-07-07 DIAGNOSIS — J441 Chronic obstructive pulmonary disease with (acute) exacerbation: Secondary | ICD-10-CM

## 2021-07-27 ENCOUNTER — Other Ambulatory Visit: Payer: Self-pay | Admitting: Family Medicine

## 2021-07-29 ENCOUNTER — Ambulatory Visit: Payer: Self-pay

## 2021-07-29 DIAGNOSIS — J449 Chronic obstructive pulmonary disease, unspecified: Secondary | ICD-10-CM

## 2021-07-29 DIAGNOSIS — E1149 Type 2 diabetes mellitus with other diabetic neurological complication: Secondary | ICD-10-CM

## 2021-07-29 NOTE — Chronic Care Management (AMB) (Signed)
Chronic Care Management   CCM RN Visit Note  07/29/2021 Name: Kathryn Lewis MRN: [id number] DOB: 10-01-1945  Subjective: Kathryn Lewis is a 76 y.o. year old female who is a primary care patient of [place], Malka So, MD. The care management team was consulted for assistance with disease management and care coordination needs.    Case closed goals met  for  Case closed goals met  in response to provider referral for case management and/or care coordination services.   Consent to Services:  The patient was given information about Chronic Care Management services, agreed to services, and gave verbal consent prior to initiation of services.  Please see initial visit note for detailed documentation.   Patient agreed to services and verbal consent obtained.   Assessment: Review of patient past medical history, allergies, medications, health status, including review of consultants reports, laboratory and other test data, was performed as part of comprehensive evaluation and provision of chronic care management services.   SDOH (Social Determinants of Health) assessments and interventions performed:    CCM Care Plan  Allergies  Allergen Reactions   Influenza Vaccines Anaphylaxis   Amoxicillin Other (See Comments)    colitis   Augmentin [Amoxicillin-Pot Clavulanate] Other (See Comments)    colitis   Cefadroxil Other (See Comments)    Reported by Kathryn Lewis 2013 - unknown reaction   Cephalexin Other (See Comments)    colitis   Erythromycin Other (See Comments)    colitis   Niacin And Related Other (See Comments)    colitis   Nitrofurantoin Macrocrystal Other (See Comments)    Reported by Kathryn [facility] 2013 - unknown reaction    Outpatient Encounter Medications as of 07/29/2021  Medication Sig   albuterol (VENTOLIN HFA) 108 (90 Base) MCG/ACT inhaler Inhale 2 puffs into the lungs every 6 (six) hours as needed for wheezing or shortness of breath.   amLODipine (NORVASC) 5 MG tablet  Take 1 tablet (5 mg total) by mouth daily.   ASPIRIN LOW DOSE 81 MG tablet TAKE ONE TABLET BY MOUTH EVERY DAY   Cholecalciferol (VITAMIN D3 PO) Take 8,000 Units by mouth daily.   Cholecalciferol (VITAMIN D3) 50 MCG (2000 UT) TABS TAKE ONE TABLET BY MOUTH EVERY DAY   clopidogrel (PLAVIX) 75 MG tablet TAKE ONE TABLET BY MOUTH EVERY DAY   ezetimibe (ZETIA) 10 MG tablet TAKE ONE TABLET BY MOUTH EVERY DAY   FEROSUL 325 (65 Fe) MG tablet TAKE ONE TABLET BY MOUTH EVERY TWO DAYS with FOUR OUNCE of orange JUICE   fluticasone (FLONASE) 50 MCG/ACT nasal spray Place 2 sprays into both nostrils daily.   furosemide (LASIX) 20 MG tablet Take 1 tablet (20 mg total) by mouth daily as needed for edema.   GNP PAIN RELIEF EX-STRENGTH 500 MG tablet TAKE ONE TABLET BY MOUTH FOUR TIMES DAILY AS NEEDED FOR PAIN   guaiFENesin (MUCINEX) 600 MG 12 hr tablet Take 1 tablet (600 mg total) by mouth daily as needed for to loosen phlegm.   Insulin Aspart FlexPen 100 UNIT/ML SOPN INJECT subcutaneously THREE TIMES DAILY PER sliding scales BLOOD SUGAR (150 NO INSULIN)+ 151-180 Give FOUR units. 181-220 Give SIX units, 221-260 Give EIGHT units, 261-300 Give 10 units 301-340 Give 12 units AND > 341 Give 14 units   isosorbide mononitrate (IMDUR) 30 MG 24 hr tablet Take 1 tablet (30 mg total) by mouth daily.   lamoTRIgine (LAMICTAL) 200 MG tablet TAKE 1.5 TABLETS BY MOUTH TWICE DAILY   LANTUS SOLOSTAR 100  UNIT/ML Solostar Pen INJECT 20 units AT BEDTIME   losartan (COZAAR) 50 MG tablet TAKE ONE TABLET BY MOUTH EVERY DAY   metoprolol tartrate (LOPRESSOR) 50 MG tablet TAKE ONE TABLET BY MOUTH TWICE DAILY   Omega-3 Fatty Acids (FISH OIL) 1000 MG CAPS TAKE TWO CAPSULES BY MOUTH EVERY DAY   phenytoin (DILANTIN) 100 MG ER capsule Take 3 capsules (300 mg total) by mouth daily.   phenytoin (DILANTIN) 30 MG ER capsule Take 1 capsule (30 mg total) by mouth at bedtime.   polyvinyl alcohol (ARTIFICIAL TEARS) 1.4 % ophthalmic solution Place 1 drop  into both eyes in the morning and at bedtime.   rosuvastatin (CRESTOR) 40 MG tablet TAKE ONE TABLET BY MOUTH EVERY DAY   Spacer/Aero Chamber Mouthpiece MISC 1 Device by Does not apply route daily as needed.   SPIRIVA HANDIHALER 18 MCG inhalation capsule place ONE capsule into inhaler AND inhale EVERY DAY   spironolactone (ALDACTONE) 50 MG tablet TAKE ONE TABLET BY MOUTH EVERY DAY   tiZANidine (ZANAFLEX) 4 MG tablet TAKE 1/2 TABLET BY MOUTH EVERY 12 HOURS AS NEEDED muscle SPASMS   No facility-administered encounter medications on file as of 07/29/2021.    Patient Active Problem List   Diagnosis Date Noted   Dementia without behavioral disturbance (Kathryn Lewis) 01/23/2019   Peripheral polyneuropathy 10/24/2018   PAD (peripheral artery disease) (Kathryn Lewis) 04/05/2018   Carotid artery disease (Kathryn Lewis) 04/05/2018   Hypokalemia 02/27/2018   Frequent falls 07/08/2017   Back pain 07/08/2017   Mixed hyperlipidemia 05/27/2017   Unstable gait 01/10/2017   Hyperlipidemia associated with type 2 diabetes mellitus (Kathryn Lewis) 01/10/2017   OSA on CPAP 12/24/2016   COPD (chronic obstructive pulmonary disease) (Kathryn Lewis) 12/24/2016   Pulmonary nodule 12/24/2016   Generalized weakness 12/12/2015   Diabetes mellitus type 2 with neurological manifestations (Kathryn Lewis) 12/12/2015   Hypertension with heart disease 12/12/2015   Seizure disorder as sequela of cerebrovascular accident (Kathryn Lewis) 04/17/2015   CAD (coronary artery disease) 04/17/2015   Anemia, unspecified 03/02/2010   Cough 03/02/2010   Depressive disorder, not elsewhere classified 03/02/2010   Pneumonia, organism unspecified(486) 03/02/2010   Shortness of breath 03/02/2010   Type II diabetes mellitus with neurological manifestations (Kathryn Lewis) 03/02/2010   Wheezing 03/02/2010   Chronic airway obstruction, not elsewhere classified 03/02/2010   Essential hypertension 03/02/2010   Acute, but ill-defined, cerebrovascular disease 03/02/2010    Conditions to be addressed/monitored:COPD  and DMII  Care Plan : RN Care Manager Plan of Care  Updates made by Dimitri Ped, RN since 07/29/2021 12:00 AM  Completed 07/29/2021   Problem: Chronic Disease Management and Care Coordination Needs (DM. COPD, seizures, dementia,HTN, HLD) Resolved 07/29/2021  Priority: High     Long-Range Goal: Establish Plan of Care for Chronic Disease Management Needs (DM. COPD, seizures, dementia,HTN, HLD) Completed 07/29/2021  Start Date: 12/23/2020  Expected End Date: 06/16/2022  Priority: High  Note:   Case closed goals met Current Barriers:  Care Coordination needs related to ADL IADL limitations, Memory Deficits, Inability to perform ADL's independently, and Inability to perform IADL's independently Chronic Disease Management support and education needs related to HTN, HLD, COPD, DMII, Dementia, and Seizures  Cognitive Deficits Pt lives at [place] with daughter Ramaya Guile.  States pt still has poor safety awareness and need to be reminded to not get up quickly and to use her walker.  States pt had a fall recently and hit her head and was seen at the ED.  States she  did not lock her brakes when transferring.  States her neck is feeling better now. Facility assists pt with medications, CBG checks and IADLS.  States pt's appetite is good and she will eat sweets if offered to her at her residence.  States she get confused at times especially in the evening.  States her CBG are good but she does not know the numbers.  Denies any low or really high readings.  States her B/P have been good. Denies any shortness of breath or coughing. States that [facility] and  palliative care are seeing pt.  RNCM Clinical Goal(s):  Patient will verbalize basic understanding of  HTN, HLD, COPD, DMII, Dementia, and seizures disease process and self health management plan as evidenced by voiced adherence to plan of care take all medications exactly as prescribed and will call provider for  medication related questions as evidenced by dispense report and pt verbalization attend all scheduled medical appointments: no up coming scheduled as evidenced by review of medical record demonstrate Improved adherence to prescribed treatment plan for HTN, HLD, COPD, DMII, Dementia, and seizures as evidenced by voiced adherence to plan of care continue to work with RN Care Manager to address care management and care coordination needs related to  HTN, HLD, COPD, DMII, Dementia, and seizures as evidenced by adherence to CM Team Scheduled appointments through collaboration with RN Care manager, provider, and care team.   Interventions: 1:1 collaboration with primary care provider regarding development and update of comprehensive plan of care as evidenced by provider attestation and co-signature Inter-disciplinary care team collaboration (see longitudinal plan of care) Evaluation of current treatment plan related to  self management and patient's adherence to plan as established by provider   COPD Interventions:  (Status:  Goal Met.) Long Term Goal Advised patient to track and manage COPD triggers Provided instruction about proper use of medications used for management of COPD including inhalers Advised patient to self assesses COPD action plan zone and make appointment with provider if in the yellow zone for 48 hours without improvement Provided education about and advised patient to utilize infection prevention strategies to reduce risk of respiratory infection Discussed the importance of adequate rest and management of fatigue with COPD   Diabetes Interventions:  (Status:  Goal Met.) Long Term Goal Assessed patient's understanding of A1c goal: <7% Provided education to patient about basic DM disease process Reviewed medications with patient and discussed importance of medication adherence Counseled on importance of regular laboratory monitoring as prescribed Provided patient with written  educational materials related to hypo and hyperglycemia and importance of correct treatment Advised patient, providing education and rationale, to check cbg daily and record, calling provider for findings outside established parameters Review of patient status, including review of consultants reports, relevant laboratory and other test results, and medications completed Reinforced to try to encourage pt to limit concentrated sweets Lab Results  Component Value Date   HGBA1C 5.9 01/30/2021   Falls Interventions:  (Status:  Gaol Not Met.) Long Term Goal Reviewed medications and discussed potential side effects of medications such as dizziness and frequent urination Advised patient of importance of notifying provider of falls Assessed for falls since last encounter Assessed patients knowledge of fall risk prevention secondary to previously provided education Assessed working status of life alert bracelet and patient adherence Reviewed to reinforce to pt to lock her brakes when transferring. Reinforced safety precautions with seizures and to encourage to use walker at all times   Hyperlipidemia Interventions:  (Status:  Goal Met.) Long Term Goal Medication review performed; medication list updated in electronic medical record.  Provider established cholesterol goals reviewed Counseled on importance of regular laboratory monitoring as prescribed Reviewed importance of limiting foods high in cholesterol  Hypertension Interventions:  (Status:  Goal Met.) Long Term Goal Last practice recorded BP readings:  BP Readings from Last 3 Encounters:  06/11/21 (!) 166/70  05/26/21 120/80  01/30/21 128/80  Most recent eGFR/CrCl: No results found for: EGFR  No components found for: CRCL  Evaluation of current treatment plan related to hypertension self management and patient's adherence to plan as established by provider Provided education to patient re: stroke prevention, s/s of heart attack and  stroke Reviewed medications with patient and discussed importance of compliance Advised patient, providing education and rationale, to monitor blood pressure daily and record, calling PCP for findings outside established parameters Provided education on prescribed diet low sodium low CHO Reinforced to notify provider for elevated B/P readings  Patient Goals/Self-Care Activities: Take all medications as prescribed Attend all scheduled provider appointments Call pharmacy for medication refills 3-7 days in advance of running out of medications Call provider office for new concerns or questions  schedule appointment with eye doctor check blood sugar at prescribed times: once daily and when you have symptoms of low or high blood sugar check feet daily for cuts, sores or redness take the blood sugar log to all doctor visits manage portion size switch to sugar-free drinks keep feet up while sitting identify and remove indoor air pollutants limit outdoor activity during cold weather do breathing exercises every day follow rescue plan if symptoms flare-up get at least 7 to 8 hours of sleep at night check blood pressure daily take blood pressure log to all doctor appointments call doctor for signs and symptoms of high blood pressure take medications for blood pressure exactly as prescribed call for medicine refill 2 or 3 days before it runs out take all medications exactly as prescribed call doctor with any symptoms you believe are related to your medicine  Follow Up Plan:  The patient has been provided with contact information for the care management team and has been advised to call with any health related questions or concerns.  No further follow up required: Case closed goals met       Plan:The patient has been provided with contact information for the care management team and has been advised to call with any health related questions or concerns.  No further follow up required: Case  closed goals met Peter Garter RN, [facility], CDE Care Management Coordinator [facility] [phone]

## 2021-07-29 NOTE — Patient Instructions (Signed)
Visit Information Case closed goals met Thank you for allowing me to share the care management and care coordination services that are available to you as part of your health plan and services through your primary care provider and medical home. Please reach out to me at [phone] if the care management/care coordination team may be of assistance to you in the future.   Marquese Burkland RN, BSN,CCM, CDE Care Management Coordinator [facility] [phone]   

## 2021-08-04 ENCOUNTER — Other Ambulatory Visit: Payer: Self-pay | Admitting: Family Medicine

## 2021-08-14 ENCOUNTER — Encounter: Payer: Self-pay | Admitting: Family Medicine

## 2021-08-14 ENCOUNTER — Non-Acute Institutional Stay: Payer: PPO | Admitting: Hospice

## 2021-08-14 DIAGNOSIS — E1169 Type 2 diabetes mellitus with other specified complication: Secondary | ICD-10-CM

## 2021-08-14 DIAGNOSIS — E782 Mixed hyperlipidemia: Secondary | ICD-10-CM | POA: Diagnosis not present

## 2021-08-14 DIAGNOSIS — Z515 Encounter for palliative care: Secondary | ICD-10-CM

## 2021-08-14 DIAGNOSIS — J449 Chronic obstructive pulmonary disease, unspecified: Secondary | ICD-10-CM

## 2021-08-14 DIAGNOSIS — R269 Unspecified abnormalities of gait and mobility: Secondary | ICD-10-CM

## 2021-08-14 NOTE — Progress Notes (Signed)
[facility] Consult Note Telephone: [phone]  Fax: [phone]  PATIENT NAME: Kathryn Lewis DOB: Mar 18, 1945 MRN: [id number]  PRIMARY CARE PROVIDER:   [place], Betty G, MD Martinique, Betty G, MD [place],  [place] [id number]  REFERRING PROVIDER: [place], Betty G, MD Martin[place], Betty G, MD [place],  [place] [address]  RESPONSIBLE PARTY:  [name] is the Legal guardian Contact Information     Name Relation Home Work Mobile   [place]  [place] Daughter   [phone]       Visit is to build trust and highlight Palliative Medicine as specialized medical care for people living with serious illness, aimed at facilitating better quality of life through symptoms relief, assisting with advance care planning and complex medical decision making. [name] and her son Helo are with patient during visit. This is a follow up visit.  RECOMMENDATIONS/PLAN:   Advance Care Planning/Code Status: Patient is a Do Not Resuscitate.   Goals of Care: Goals of care include to maximize quality of life and symptom management.  Visit consisted of counseling and education dealing with the complex and emotionally intense issues of symptom management and palliative care in the setting of serious and potentially life-threatening illness.  Palliative care team will continue to support patient, patient's family, and medical team.  Symptom management/Plan:  COPD: Stable. Not on oxygen. No recent COPD exacerbation. Continue Spiriva daily. Patient has Albuterol on hand. Avoid triggers, encourage slow deep breathing. Type 2 DM: A1c within normal range. Continue insulin as ordered. Education reiterated on no concentrated sweets.  Repeat A1c. Negative for symptoms of hypoglycemia, polyuria, polydipsia, foot ulcers. Gait disturbance: Last fall 06/11/2021. Patient refuses PT/OT, per [name].  Fall precautions in place;  gets around mostly on her  wheelchair.  Follow up: Palliative care will continue to follow for complex medical decision making, advance care planning, and clarification of goals. Return 6 weeks or prn. Encouraged to call provider sooner with any concerns.  CHIEF COMPLAINT: Palliative follow up  HISTORY OF PRESENT ILLNESS:  Kathryn Lewis a 76 y.o. female with multiple medical problems including COPD, type 2 diabetes mellitus, hypertension, hyperlipidemia, CAD status post CVA.   08/06/21: Patient had all her teeth removed, now has upper and lower dentures. Eating well on soft foods amd meal supplements. Patient denies respiratory distress, no pain/discomfort. History obtained from review of EMR, discussion with primary team, family and/or patient. Records reviewed and summarized above. All 10 point systems reviewed and are negative except as documented in history of present illness above  Review and summarization of Epic records shows history from other than patient.   Palliative Care was asked to follow this patient to help address complex decision making in the context of advance care planning and goals of care clarification.   PHYSICAL EXAM  General: cooperative, appropriately dressed Cardiovascular: regular rate and rhythm; no edema in BLE Pulmonary: no cough, no increased work of breathing, normal respiratory effort Abdomen: soft, non tender, no guarding, positive bowel sounds in all quadrants GU:  no suprapubic tenderness Eyes: Normal lids, no discharge ENMT: Moist mucous membranes Musculoskeletal:  ambulatory with assistance device; uses wheelchair sometimes Skin: no rash to visible skin, warm without cyanosis,  Psych: non-anxious affect Neurological: Weakness but otherwise non focal, memory loss Heme/lymph/immuno: no bruises, no bleeding  PERTINENT MEDICATIONS:  Outpatient Encounter Medications as of 08/14/2021  Medication Sig   albuterol (VENTOLIN HFA) 108 (90 Base) MCG/ACT inhaler Inhale 2  puffs into the  lungs every 6 (six) hours as needed for wheezing or shortness of breath.   amLODipine (NORVASC) 5 MG tablet Take 1 tablet (5 mg total) by mouth daily.   ASPIRIN LOW DOSE 81 MG tablet TAKE ONE TABLET BY MOUTH EVERY DAY   Cholecalciferol (VITAMIN D3 PO) Take 8,000 Units by mouth daily.   Cholecalciferol (VITAMIN D3) 50 MCG (2000 UT) TABS TAKE ONE TABLET BY MOUTH EVERY DAY   clopidogrel (PLAVIX) 75 MG tablet TAKE ONE TABLET BY MOUTH EVERY DAY   ezetimibe (ZETIA) 10 MG tablet TAKE ONE TABLET BY MOUTH EVERY DAY   FEROSUL 325 (65 Fe) MG tablet TAKE ONE TABLET BY MOUTH EVERY TWO DAYS with FOUR OUNCE of orange JUICE   fluticasone (FLONASE) 50 MCG/ACT nasal spray Place 2 sprays into both nostrils daily.   furosemide (LASIX) 20 MG tablet Take 1 tablet (20 mg total) by mouth daily as needed for edema.   GNP PAIN RELIEF EX-STRENGTH 500 MG tablet TAKE ONE TABLET BY MOUTH FOUR TIMES DAILY AS NEEDED FOR PAIN   guaiFENesin (MUCINEX) 600 MG 12 hr tablet Take 1 tablet (600 mg total) by mouth daily as needed for to loosen phlegm.   Insulin Aspart FlexPen 100 UNIT/ML SOPN INJECT subcutaneously THREE TIMES DAILY PER sliding scales BLOOD SUGAR (150 NO INSULIN)+ 151-180 Give FOUR units. 181-220 Give SIX units, 221-260 Give EIGHT units, 261-300 Give 10 units 301-340 Give 12 units AND > 341 Give 14 units   isosorbide mononitrate (IMDUR) 30 MG 24 hr tablet Take 1 tablet (30 mg total) by mouth daily.   lamoTRIgine (LAMICTAL) 200 MG tablet TAKE 1.5 TABLETS BY MOUTH TWICE DAILY   LANTUS SOLOSTAR 100 UNIT/ML Solostar Pen INJECT 20 units AT BEDTIME   losartan (COZAAR) 50 MG tablet TAKE ONE TABLET BY MOUTH EVERY DAY   metoprolol tartrate (LOPRESSOR) 50 MG tablet TAKE ONE TABLET BY MOUTH TWICE DAILY   Omega-3 Fatty Acids (FISH OIL) 1000 MG CAPS TAKE TWO CAPSULES BY MOUTH EVERY DAY   phenytoin (DILANTIN) 100 MG ER capsule Take 3 capsules (300 mg total) by mouth daily.   phenytoin (DILANTIN) 30 MG ER capsule Take 1 capsule (30 mg  total) by mouth at bedtime.   polyvinyl alcohol (ARTIFICIAL TEARS) 1.4 % ophthalmic solution Place 1 drop into both eyes in the morning and at bedtime.   rosuvastatin (CRESTOR) 40 MG tablet TAKE ONE TABLET BY MOUTH EVERY DAY   Spacer/Aero Chamber Mouthpiece MISC 1 Device by Does not apply route daily as needed.   SPIRIVA HANDIHALER 18 MCG inhalation capsule place ONE capsule into inhaler AND inhale EVERY DAY   spironolactone (ALDACTONE) 50 MG tablet TAKE ONE TABLET BY MOUTH EVERY DAY   tiZANidine (ZANAFLEX) 4 MG tablet TAKE 1/2 TABLET BY MOUTH EVERY 12 HOURS AS NEEDED muscle SPASMS   No facility-administered encounter medications on file as of 08/14/2021.    HOSPICE ELIGIBILITY/DIAGNOSIS: TBD  PAST MEDICAL HISTORY:  Past Medical History:  Diagnosis Date   Allergy    Arthritis    Bipolar 1 disorder ([place])    Blood transfusion without reported diagnosis    Carotid artery stenosis    50-69% bilateral ICA stenoses by velocity criteria but no visible plaque and ICA/CCA ratio 1.72 on right and 1.38 on left   Coronary artery disease    Depression    Diabetes mellitus without complication (HCC)    Echocardiogram    Echo 06/2018: Hyperdynamic systolic function, EF >86, mild concentric LVH, elevated LVEDP (  E/E' suggests impaired relaxation), mild AI, lipomatous interatrial septum   Hypertension    Left adrenal mass (HCC)    2.5 cm L adrenal mass on CT per note of Dr. Seleta Rhymes 05/20/2014   MI, old    PAD (peripheral artery disease) ([facility])    03/2014 - CT aortogram with lower extremity runoff (mild to moderate disease in common iliac arteries, complete occulusion of her bilateral external iliac arteries with heavily disease common femoral arteries. She also has disease to her SFAs bilaterally. Popliteal arteries and tibial vessel runoff appears to be adequate)   Seizures (HCC)    Stroke ([place])      ALLERGIES:  Allergies  Allergen Reactions   Influenza Vaccines Anaphylaxis   Amoxicillin Other (See  Comments)    colitis   Augmentin [Amoxicillin-Pot Clavulanate] Other (See Comments)    colitis   Cefadroxil Other (See Comments)    Reported by [facility] 2013 - unknown reaction   Cephalexin Other (See Comments)    colitis   Erythromycin Other (See Comments)    colitis   Niacin And Related Other (See Comments)    colitis   Nitrofurantoin Macrocrystal Other (See Comments)    Reported by [facility] 2013 - unknown reaction    I spent 45 minutes providing this consultation; time includes spent with patient/family, chart review and documentation. More than 50% of the time in this consultation was spent on care coordination  Thank you for the opportunity to participate in the care of [place] Please call our office at [phone] if we can be of additional assistance.  Note: Portions of this note were generated with [employer]. Dictation errors may occur despite best attempts at proofreading.  Teodoro Spray, NP

## 2021-08-19 ENCOUNTER — Other Ambulatory Visit: Payer: Self-pay | Admitting: Family Medicine

## 2021-08-20 ENCOUNTER — Telehealth: Payer: Self-pay

## 2021-08-20 NOTE — Telephone Encounter (Signed)
OV on 01/30/21 notes: Return in about 6 months (around 07/30/2021).  No f/u noted.  LVM instruction to call back to schedule appt. Appt needs to be prior to 10/24/21.

## 2021-08-28 ENCOUNTER — Other Ambulatory Visit: Payer: Self-pay | Admitting: Family Medicine

## 2021-09-09 ENCOUNTER — Telehealth: Payer: Self-pay | Admitting: Family Medicine

## 2021-09-09 NOTE — Telephone Encounter (Signed)
[facility] call and stated pt have the 300 mg of Dilantin but is out of the 30 mg and need a order for the 30 mg because he take 330 mg a night want it sent to  [facility], [place] Phone:  [phone]  Fax:  [phone]

## 2021-09-10 ENCOUNTER — Other Ambulatory Visit: Payer: Self-pay | Admitting: Neurology

## 2021-09-10 MED ORDER — PHENYTOIN SODIUM EXTENDED 30 MG PO CAPS: ORAL_CAPSULE | ORAL | 0 refills | Status: DC

## 2021-09-10 NOTE — Telephone Encounter (Signed)
Pt daughter who is on her DPR called informed that pt needs to make an appointment for a follow up with Dr Delice Lesch she was transferred to the front to get scheduled,

## 2021-09-16 NOTE — Progress Notes (Unsigned)
HPI: Kathryn Lewis is a 76 y.o. female with hx of HTN,DM II,PAD,peripheral neuropathy, dementia, depression,CVA with residual seizure disorder, OSA,and HLD here today with her daughter for follow up. No new problems since her last visit.  Diabetes Mellitus II: Dx'ed in 2008. - Checking BG at home: 110's-170's.  - Medications: Lantus 20 U and Novolog per sliding scale. Most days she does not need Novolog. - Diet: She still gets some sweets a few times per week, meals are provided at assisted living. - Exercise: None.  - Negative for symptoms of hypoglycemia, polyuria, polydipsia, foot ulcers/trauma. Peripheral neuropathy, numbness.  Lab Results  Component Value Date   HGBA1C 5.9 01/30/2021   Lab Results  Component Value Date   MICROALBUR 11.9 (H) 07/14/2020   Hypertension:  Medications:Spironolactone 50 mg daily, Metoprolol tartrate 50 mg bid, Imdur 30 mg daily, Losartan 50 mg daily, and Amlodipine 5 mg daily.  BP readings:Not checking. She cannot exercise regularly due to unstable gait.In a wheel chair, usus a walker in her room when she needs the bathroo. Side effects:No Negative for unusual or severe headache, visual changes, chest pain, dyspnea,new focal weakness, or edema.  Lab Results  Component Value Date   CREATININE 1.49 (H) 05/26/2021   BUN 15 05/26/2021   NA 129 (L) 05/26/2021   K 4.9 05/26/2021   CL 100 05/26/2021   CO2 25 05/26/2021   COPD: She is on Spiriva once daily. She has not used Albuterol inh in a while.  PAD on Plavix 75 mg daily,Aspirin 81 mg daily, Zetia 10 mg daily and Rosuvastatin 40 mg daily. Lab Results  Component Value Date   CHOL 183 09/09/2017   HDL 66 09/09/2017   LDLCALC 85 09/09/2017   TRIG 160 (H) 09/09/2017   CHOLHDL 2.8 09/09/2017   Review of Systems  Constitutional:  Positive for fatigue. Negative for activity change, appetite change and fever.  HENT:  Negative for nosebleeds and sore throat.   Respiratory:  Negative  for cough and wheezing.   Gastrointestinal:  Negative for abdominal pain, nausea and vomiting.       Negative for changes in bowel habits.  Genitourinary:  Negative for decreased urine volume and hematuria.  Musculoskeletal:  Positive for gait problem.  Neurological:  Negative for syncope and facial asymmetry.  Rest see pertinent positives and negatives per HPI.  Current Outpatient Medications on File Prior to Visit  Medication Sig Dispense Refill   albuterol (VENTOLIN HFA) 108 (90 Base) MCG/ACT inhaler Inhale 2 puffs into the lungs every 6 (six) hours as needed for wheezing or shortness of breath. 1 each 6   amLODipine (NORVASC) 5 MG tablet Take 1 tablet (5 mg total) by mouth daily. 30 tablet 5   ASPIRIN LOW DOSE 81 MG tablet TAKE ONE TABLET BY MOUTH EVERY DAY 30 tablet 2   Cholecalciferol (GNP VITAMIN D MAXIMUM STRENGTH) 50 MCG (2000 UT) TABS TAKE ONE TABLET BY MOUTH EVERY DAY 30 tablet 0   Cholecalciferol (VITAMIN D3 PO) Take 8,000 Units by mouth daily.     clopidogrel (PLAVIX) 75 MG tablet TAKE ONE TABLET BY MOUTH EVERY DAY 30 tablet 5   ezetimibe (ZETIA) 10 MG tablet TAKE ONE TABLET BY MOUTH EVERY DAY 30 tablet 5   FEROSUL 325 (65 Fe) MG tablet TAKE ONE TABLET BY MOUTH EVERY TWO DAYS with FOUR OUNCE of orange JUICE 100 tablet 0   fluticasone (FLONASE) 50 MCG/ACT nasal spray Place 2 sprays into both nostrils daily. 16 g 6  furosemide (LASIX) 20 MG tablet Take 1 tablet (20 mg total) by mouth daily as needed for edema. 30 tablet 3   GNP PAIN RELIEF EX-STRENGTH 500 MG tablet TAKE ONE TABLET BY MOUTH FOUR TIMES DAILY 120 tablet 0   guaiFENesin (MUCINEX) 600 MG 12 hr tablet Take 1 tablet (600 mg total) by mouth daily as needed for to loosen phlegm. 24 tablet 0   Insulin Aspart FlexPen 100 UNIT/ML SOPN INJECT subcutaneously THREE TIMES DAILY PER sliding scales BLOOD SUGAR (150 NO INSULIN)+ 151-180 Give FOUR units. 181-220 Give SIX units, 221-260 Give EIGHT units, 261-300 Give 10 units 301-340  Give 12 units AND > 341 Give 14 units 15 mL 1   isosorbide mononitrate (IMDUR) 30 MG 24 hr tablet Take 1 tablet (30 mg total) by mouth daily. 30 tablet 5   lamoTRIgine (LAMICTAL) 200 MG tablet TAKE 1.5 TABLETS BY MOUTH TWICE DAILY 270 tablet 3   LANTUS SOLOSTAR 100 UNIT/ML Solostar Pen INJECT 20 units AT BEDTIME 15 mL 4   losartan (COZAAR) 50 MG tablet TAKE ONE TABLET BY MOUTH EVERY DAY 30 tablet 5   metoprolol tartrate (LOPRESSOR) 50 MG tablet TAKE ONE TABLET BY MOUTH TWICE DAILY 60 tablet 5   Omega-3 Fatty Acids (FISH OIL) 1000 MG CAPS TAKE TWO CAPSULES BY MOUTH EVERY DAY 60 capsule 5   phenytoin (DILANTIN) 100 MG ER capsule Take 3 capsules (300 mg total) by mouth daily. 270 capsule 3   phenytoin (DILANTIN) 30 MG ER capsule Take 1 capsule (30 mg total) by mouth at bedtime. 90 capsule 0   polyvinyl alcohol (ARTIFICIAL TEARS) 1.4 % ophthalmic solution Place 1 drop into both eyes in the morning and at bedtime. 15 mL 6   rosuvastatin (CRESTOR) 40 MG tablet TAKE ONE TABLET BY MOUTH EVERY DAY 30 tablet 5   Spacer/Aero Chamber Mouthpiece MISC 1 Device by Does not apply route daily as needed. 1 each 0   SPIRIVA HANDIHALER 18 MCG inhalation capsule place ONE capsule into inhaler AND inhale EVERY DAY 30 capsule 5   spironolactone (ALDACTONE) 50 MG tablet TAKE ONE TABLET BY MOUTH EVERY DAY 30 tablet 5   tiZANidine (ZANAFLEX) 4 MG tablet TAKE 1/2 TABLET BY MOUTH EVERY 12 HOURS AS NEEDED muscle SPASMS 30 tablet 1   No current facility-administered medications on file prior to visit.   Past Medical History:  Diagnosis Date   Allergy    Arthritis    Bipolar 1 disorder ([place])    Blood transfusion without reported diagnosis    Carotid artery stenosis    50-69% bilateral ICA stenoses by velocity criteria but no visible plaque and ICA/CCA ratio 1.72 on right and 1.38 on left   Coronary artery disease    Depression    Diabetes mellitus without complication (HCC)    Echocardiogram    Echo 06/2018:  Hyperdynamic systolic function, EF >01, mild concentric LVH, elevated LVEDP (E/E' suggests impaired relaxation), mild AI, lipomatous interatrial septum   Hypertension    Left adrenal mass (HCC)    2.5 cm L adrenal mass on CT per note of Dr. Seleta Rhymes 05/20/2014   MI, old    PAD (peripheral artery disease) ([place])    03/2014 - CT aortogram with lower extremity runoff (mild to moderate disease in common iliac arteries, complete occulusion of her bilateral external iliac arteries with heavily disease common femoral arteries. She also has disease to her SFAs bilaterally. Popliteal arteries and tibial vessel runoff appears to be adequate)   Seizures ([place])  Stroke ([place])    Allergies  Allergen Reactions   Influenza Vaccines Anaphylaxis   Amoxicillin Other (See Comments)    colitis   Augmentin [Amoxicillin-Pot Clavulanate] Other (See Comments)    colitis   Cefadroxil Other (See Comments)    Reported by [facility] 2013 - unknown reaction   Cephalexin Other (See Comments)    colitis   Erythromycin Other (See Comments)    colitis   Niacin And Related Other (See Comments)    colitis   Nitrofurantoin Macrocrystal Other (See Comments)    Reported by [facility] 2013 - unknown reaction   Social History   Socioeconomic History   Marital status: Single    Spouse name: Not on file   Number of children: 2   Years of education: Not on file   Highest education level: Not on file  Occupational History   Not on file  Tobacco Use   Smoking status: Former    Packs/day: 1.00    Types: Cigarettes    Quit date: 2015    Years since quitting: 8.6   Smokeless tobacco: Never  Vaping Use   Vaping Use: Never used  Substance and Sexual Activity   Alcohol use: No   Drug use: No   Sexual activity: Not on file  Other Topics Concern   Not on file  Social History Narrative   Pt lives in assisted living facility   Has 2 adult children, she is widowed, husband died in his early 9's in a work related  accident   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   Social Determinants of Health   Financial Resource Strain: Industry  (11/27/2019)   Overall Financial Resource Strain (CARDIA)    Difficulty of Paying Living Expenses: Not hard at all  Food Insecurity: No Food Insecurity (11/27/2019)   Hunger Vital Sign    Worried About Running Out of Food in the Last Year: Never true    Ran Out of Food in the Last Year: Never true  Transportation Needs: No Transportation Needs (11/27/2019)   PRAPARE - Hydrologist (Medical): No    Lack of Transportation (Non-Medical): No  Physical Activity: Inactive (11/27/2019)   Exercise Vital Sign    Days of Exercise per Week: 0 days    Minutes of Exercise per Session: 0 min  Stress: No Stress Concern Present (11/27/2019)   [place]    Feeling of Stress : Not at all  Social Connections: Socially Isolated (11/27/2019)   Social Connection and Isolation Panel [NHANES]    Frequency of Communication with Friends and Family: More than three times a week    Frequency of Social Gatherings with Friends and Family: More than three times a week    Attends Religious Services: Never    Marine scientist or Organizations: No    Attends Archivist Meetings: Never    Marital Status: Widowed   Vitals:   09/18/21 1126  BP: 128/80  Pulse: 67  Resp: 16  SpO2: 99%  Body mass index is 24.09 kg/m. Physical Exam Vitals and nursing note reviewed.  Constitutional:      General: She is not in acute distress.    Appearance: She is well-developed.  HENT:     Head: Normocephalic and atraumatic.     Mouth/Throat:     Mouth: Mucous membranes are moist.     Pharynx: Oropharynx is clear.  Eyes:     Conjunctiva/sclera: Conjunctivae normal.  Cardiovascular:     Rate and Rhythm: Normal rate and regular rhythm.     Pulses:          Dorsalis pedis pulses are 2+ on the right  side and 2+ on the left side.     Heart sounds: No murmur heard. Pulmonary:     Effort: Pulmonary effort is normal. No respiratory distress.     Breath sounds: Normal breath sounds.  Abdominal:     Palpations: Abdomen is soft. There is no hepatomegaly or mass.     Tenderness: There is no abdominal tenderness.  Lymphadenopathy:     Cervical: No cervical adenopathy.  Skin:    General: Skin is warm.     Findings: No erythema or rash.  Neurological:     General: No focal deficit present.     Mental Status: She is alert. Mental status is at baseline.     Cranial Nerves: No cranial nerve deficit.     Comments: Wheel chair.  Psychiatric:     Comments: Well groomed, good eye contact.   ASSESSMENT AND PLAN:  Kathryn Lewis was seen today for follow-up.  Diagnoses and all orders for this visit: Orders Placed This Encounter  Procedures   Microalbumin / creatinine urine ratio   Comprehensive metabolic panel   POC HgB P5F   Lab Results  Component Value Date   HGBA1C 6.6 09/18/2021   Lab Results  Component Value Date   MICROALBUR 19.8 (H) 09/18/2021   MICROALBUR 11.9 (H) 07/14/2020   Lab Results  Component Value Date   CREATININE 1.16 09/18/2021   BUN 15 09/18/2021   NA 125 (L) 09/18/2021   K 4.2 09/18/2021   CL 96 09/18/2021   CO2 26 09/18/2021   Lab Results  Component Value Date   ALT 18 09/18/2021   AST 22 09/18/2021   ALKPHOS 94 09/18/2021   BILITOT 0.2 09/18/2021   Diabetes mellitus type 2 with neurological manifestations ([place]) HgA1C at goal, 6.6. No changes in current management. Annual eye exam, periodic dental and foot care recommended. F/U in 5-6 months  Hypertension with heart disease BP adequately controlled. Continue Spironolactone 50 mg daily, Metoprolol tartrate 50 mg bid, Imdur 30 mg daily, Losartan 50 mg daily, and Amlodipine 5 mg daily. Order given to have her BP monitored q 3-4 weeks and more frequent if needed. Continue low sat diet.  Hyperlipidemia  associated with type 2 diabetes mellitus ([place]) She is not fasting today. Continue Rosuvastatin 40 mg daily and Zetia 10 mg daily.  Chronic obstructive pulmonary disease, unspecified COPD type ([place]) Problem is well controlled. Continue Spiriva 1 cap daily and Albuterol inh 2 puff qid prn.  She does not want to have further screening tests and does not want pneumonia vaccines.  Return in about 6 months (around 03/21/2022).  Jaimon Bugaj G. [place], MD  [facility]. [place] office.

## 2021-09-17 ENCOUNTER — Telehealth: Payer: HMO

## 2021-09-18 ENCOUNTER — Ambulatory Visit (INDEPENDENT_AMBULATORY_CARE_PROVIDER_SITE_OTHER): Payer: HMO | Admitting: Family Medicine

## 2021-09-18 ENCOUNTER — Encounter: Payer: Self-pay | Admitting: Family Medicine

## 2021-09-18 VITALS — BP 128/80 | HR 67 | Resp 16 | Ht 63.0 in

## 2021-09-18 DIAGNOSIS — N1831 Chronic kidney disease, stage 3a: Secondary | ICD-10-CM | POA: Diagnosis not present

## 2021-09-18 DIAGNOSIS — R778 Other specified abnormalities of plasma proteins: Secondary | ICD-10-CM

## 2021-09-18 DIAGNOSIS — E785 Hyperlipidemia, unspecified: Secondary | ICD-10-CM

## 2021-09-18 DIAGNOSIS — J449 Chronic obstructive pulmonary disease, unspecified: Secondary | ICD-10-CM | POA: Diagnosis not present

## 2021-09-18 DIAGNOSIS — E1149 Type 2 diabetes mellitus with other diabetic neurological complication: Secondary | ICD-10-CM | POA: Diagnosis not present

## 2021-09-18 DIAGNOSIS — I119 Hypertensive heart disease without heart failure: Secondary | ICD-10-CM | POA: Diagnosis not present

## 2021-09-18 DIAGNOSIS — N183 Chronic kidney disease, stage 3 unspecified: Secondary | ICD-10-CM

## 2021-09-18 DIAGNOSIS — E1169 Type 2 diabetes mellitus with other specified complication: Secondary | ICD-10-CM

## 2021-09-18 HISTORY — DX: Chronic kidney disease, stage 3 unspecified: N18.30

## 2021-09-18 LAB — COMPREHENSIVE METABOLIC PANEL
ALT: 18 U/L (ref 0–35)
AST: 22 U/L (ref 0–37)
Albumin: 3.9 g/dL (ref 3.5–5.2)
Alkaline Phosphatase: 94 U/L (ref 39–117)
BUN: 15 mg/dL (ref 6–23)
CO2: 26 mEq/L (ref 19–32)
Calcium: 9.5 mg/dL (ref 8.4–10.5)
Chloride: 96 mEq/L (ref 96–112)
Creatinine, Ser: 1.16 mg/dL (ref 0.40–1.20)
GFR: 46.02 mL/min — ABNORMAL LOW (ref >60.00)
Glucose, Bld: 164 mg/dL — ABNORMAL HIGH (ref 70–99)
Potassium: 4.2 mEq/L (ref 3.5–5.1)
Sodium: 125 mEq/L — ABNORMAL LOW (ref 135–145)
Total Bilirubin: 0.2 mg/dL (ref 0.2–1.2)
Total Protein: 9 g/dL — ABNORMAL HIGH (ref 6.0–8.3)

## 2021-09-18 LAB — POCT GLYCOSYLATED HEMOGLOBIN (HGB A1C): HbA1c, POC (controlled diabetic range): 6.6 % (ref 0.0–7.0)

## 2021-09-18 LAB — MICROALBUMIN / CREATININE URINE RATIO
Creatinine,U: 54.6 mg/dL
Microalb Creat Ratio: 36.3 mg/g — ABNORMAL HIGH (ref 0.0–30.0)
Microalb, Ur: 19.8 mg/dL — ABNORMAL HIGH (ref 0.0–1.9)

## 2021-09-18 NOTE — Patient Instructions (Addendum)
A few things to remember from today's visit:   Diabetes mellitus type 2 with neurological manifestations ([place]) - Plan: Microalbumin / creatinine urine ratio, Comprehensive metabolic panel, POC HgB W9V, CANCELED: Hemoglobin A1c  Hypertension with heart disease  Hyperlipidemia associated with type 2 diabetes mellitus ([place])  Chronic obstructive pulmonary disease, unspecified COPD type ([place])  If you need refills please call your pharmacy. Do not use My Chart to request refills or for acute issues that need immediate attention.   No changes today. Blood pressure check once per month. Caution with sugar added food intake.  Please be sure medication list is accurate. If a new problem present, please set up appointment sooner than planned today.

## 2021-09-22 ENCOUNTER — Other Ambulatory Visit: Payer: Self-pay

## 2021-09-22 MED ORDER — SPIRONOLACTONE 25 MG PO TABS: 25.0000 mg | ORAL_TABLET | Freq: Every day | ORAL | 5 refills | Status: DC

## 2021-09-24 ENCOUNTER — Other Ambulatory Visit: Payer: Self-pay | Admitting: Family Medicine

## 2021-09-24 DIAGNOSIS — I119 Hypertensive heart disease without heart failure: Secondary | ICD-10-CM

## 2021-09-24 DIAGNOSIS — I10 Essential (primary) hypertension: Secondary | ICD-10-CM

## 2021-09-29 ENCOUNTER — Other Ambulatory Visit: Payer: Self-pay | Admitting: Family Medicine

## 2021-09-30 ENCOUNTER — Other Ambulatory Visit: Payer: Self-pay

## 2021-09-30 ENCOUNTER — Inpatient Hospital Stay (HOSPITAL_BASED_OUTPATIENT_CLINIC_OR_DEPARTMENT_OTHER)
Admission: EM | Admit: 2021-09-30 | Discharge: 2021-10-09 | DRG: 191 | Disposition: A | Discharge status: Skilled Nursing Facility | Payer: HMO | Source: Skilled Nursing Facility | Attending: Internal Medicine | Admitting: Internal Medicine

## 2021-09-30 ENCOUNTER — Emergency Department (HOSPITAL_BASED_OUTPATIENT_CLINIC_OR_DEPARTMENT_OTHER): Payer: HMO

## 2021-09-30 ENCOUNTER — Telehealth: Payer: Self-pay

## 2021-09-30 ENCOUNTER — Encounter (HOSPITAL_BASED_OUTPATIENT_CLINIC_OR_DEPARTMENT_OTHER): Payer: Self-pay | Admitting: Emergency Medicine

## 2021-09-30 DIAGNOSIS — Z20822 Contact with and (suspected) exposure to covid-19: Secondary | ICD-10-CM | POA: Diagnosis present

## 2021-09-30 DIAGNOSIS — I130 Hypertensive heart and chronic kidney disease with heart failure and stage 1 through stage 4 chronic kidney disease, or unspecified chronic kidney disease: Secondary | ICD-10-CM | POA: Diagnosis present

## 2021-09-30 DIAGNOSIS — Z79899 Other long term (current) drug therapy: Secondary | ICD-10-CM | POA: Diagnosis not present

## 2021-09-30 DIAGNOSIS — Z66 Do not resuscitate: Secondary | ICD-10-CM | POA: Diagnosis not present

## 2021-09-30 DIAGNOSIS — E1149 Type 2 diabetes mellitus with other diabetic neurological complication: Secondary | ICD-10-CM | POA: Diagnosis present

## 2021-09-30 DIAGNOSIS — I672 Cerebral atherosclerosis: Secondary | ICD-10-CM | POA: Diagnosis not present

## 2021-09-30 DIAGNOSIS — R778 Other specified abnormalities of plasma proteins: Secondary | ICD-10-CM | POA: Diagnosis not present

## 2021-09-30 DIAGNOSIS — Z87891 Personal history of nicotine dependence: Secondary | ICD-10-CM

## 2021-09-30 DIAGNOSIS — G40909 Epilepsy, unspecified, not intractable, without status epilepticus: Secondary | ICD-10-CM | POA: Diagnosis present

## 2021-09-30 DIAGNOSIS — R7989 Other specified abnormal findings of blood chemistry: Secondary | ICD-10-CM | POA: Diagnosis not present

## 2021-09-30 DIAGNOSIS — N183 Chronic kidney disease, stage 3 unspecified: Secondary | ICD-10-CM | POA: Diagnosis present

## 2021-09-30 DIAGNOSIS — Z818 Family history of other mental and behavioral disorders: Secondary | ICD-10-CM

## 2021-09-30 DIAGNOSIS — Z8249 Family history of ischemic heart disease and other diseases of the circulatory system: Secondary | ICD-10-CM

## 2021-09-30 DIAGNOSIS — Z825 Family history of asthma and other chronic lower respiratory diseases: Secondary | ICD-10-CM

## 2021-09-30 DIAGNOSIS — N179 Acute kidney failure, unspecified: Secondary | ICD-10-CM | POA: Diagnosis not present

## 2021-09-30 DIAGNOSIS — R059 Cough, unspecified: Secondary | ICD-10-CM | POA: Diagnosis not present

## 2021-09-30 DIAGNOSIS — I214 Non-ST elevation (NSTEMI) myocardial infarction: Secondary | ICD-10-CM | POA: Diagnosis not present

## 2021-09-30 DIAGNOSIS — I69398 Other sequelae of cerebral infarction: Secondary | ICD-10-CM | POA: Diagnosis not present

## 2021-09-30 DIAGNOSIS — Z7902 Long term (current) use of antithrombotics/antiplatelets: Secondary | ICD-10-CM

## 2021-09-30 DIAGNOSIS — N1832 Chronic kidney disease, stage 3b: Secondary | ICD-10-CM | POA: Diagnosis present

## 2021-09-30 DIAGNOSIS — E872 Acidosis, unspecified: Secondary | ICD-10-CM | POA: Diagnosis not present

## 2021-09-30 DIAGNOSIS — Z83438 Family history of other disorder of lipoprotein metabolism and other lipidemia: Secondary | ICD-10-CM

## 2021-09-30 DIAGNOSIS — I2510 Atherosclerotic heart disease of native coronary artery without angina pectoris: Secondary | ICD-10-CM | POA: Diagnosis present

## 2021-09-30 DIAGNOSIS — E1151 Type 2 diabetes mellitus with diabetic peripheral angiopathy without gangrene: Secondary | ICD-10-CM | POA: Diagnosis present

## 2021-09-30 DIAGNOSIS — J9811 Atelectasis: Secondary | ICD-10-CM | POA: Diagnosis not present

## 2021-09-30 DIAGNOSIS — Z7952 Long term (current) use of systemic steroids: Secondary | ICD-10-CM

## 2021-09-30 DIAGNOSIS — J441 Chronic obstructive pulmonary disease with (acute) exacerbation: Principal | ICD-10-CM | POA: Diagnosis present

## 2021-09-30 DIAGNOSIS — Z880 Allergy status to penicillin: Secondary | ICD-10-CM

## 2021-09-30 DIAGNOSIS — F319 Bipolar disorder, unspecified: Secondary | ICD-10-CM | POA: Diagnosis present

## 2021-09-30 DIAGNOSIS — Z823 Family history of stroke: Secondary | ICD-10-CM

## 2021-09-30 DIAGNOSIS — Z7982 Long term (current) use of aspirin: Secondary | ICD-10-CM

## 2021-09-30 DIAGNOSIS — I700 Atherosclerosis of aorta: Secondary | ICD-10-CM | POA: Diagnosis not present

## 2021-09-30 DIAGNOSIS — I5032 Chronic diastolic (congestive) heart failure: Secondary | ICD-10-CM | POA: Diagnosis present

## 2021-09-30 DIAGNOSIS — Z888 Allergy status to other drugs, medicaments and biological substances status: Secondary | ICD-10-CM

## 2021-09-30 DIAGNOSIS — E871 Hypo-osmolality and hyponatremia: Secondary | ICD-10-CM | POA: Diagnosis present

## 2021-09-30 DIAGNOSIS — I252 Old myocardial infarction: Secondary | ICD-10-CM

## 2021-09-30 DIAGNOSIS — Z833 Family history of diabetes mellitus: Secondary | ICD-10-CM

## 2021-09-30 DIAGNOSIS — E1165 Type 2 diabetes mellitus with hyperglycemia: Secondary | ICD-10-CM | POA: Diagnosis present

## 2021-09-30 DIAGNOSIS — J069 Acute upper respiratory infection, unspecified: Secondary | ICD-10-CM | POA: Diagnosis present

## 2021-09-30 DIAGNOSIS — G4733 Obstructive sleep apnea (adult) (pediatric): Secondary | ICD-10-CM | POA: Diagnosis present

## 2021-09-30 DIAGNOSIS — S0990XA Unspecified injury of head, initial encounter: Secondary | ICD-10-CM | POA: Diagnosis not present

## 2021-09-30 DIAGNOSIS — F0390 Unspecified dementia without behavioral disturbance: Secondary | ICD-10-CM | POA: Diagnosis present

## 2021-09-30 DIAGNOSIS — Z8261 Family history of arthritis: Secondary | ICD-10-CM

## 2021-09-30 DIAGNOSIS — E785 Hyperlipidemia, unspecified: Secondary | ICD-10-CM | POA: Diagnosis present

## 2021-09-30 DIAGNOSIS — Z794 Long term (current) use of insulin: Secondary | ICD-10-CM

## 2021-09-30 DIAGNOSIS — I25119 Atherosclerotic heart disease of native coronary artery with unspecified angina pectoris: Secondary | ICD-10-CM | POA: Diagnosis not present

## 2021-09-30 DIAGNOSIS — E1122 Type 2 diabetes mellitus with diabetic chronic kidney disease: Secondary | ICD-10-CM | POA: Diagnosis present

## 2021-09-30 DIAGNOSIS — R0602 Shortness of breath: Secondary | ICD-10-CM | POA: Diagnosis not present

## 2021-09-30 DIAGNOSIS — K449 Diaphragmatic hernia without obstruction or gangrene: Secondary | ICD-10-CM | POA: Diagnosis not present

## 2021-09-30 DIAGNOSIS — I10 Essential (primary) hypertension: Secondary | ICD-10-CM | POA: Diagnosis not present

## 2021-09-30 DIAGNOSIS — N1831 Chronic kidney disease, stage 3a: Secondary | ICD-10-CM | POA: Diagnosis not present

## 2021-09-30 LAB — CBC
HCT: 32 % — ABNORMAL LOW (ref 36.0–46.0)
Hemoglobin: 10.7 g/dL — ABNORMAL LOW (ref 12.0–15.0)
MCH: 28.8 pg (ref 26.0–34.0)
MCHC: 33.4 g/dL (ref 30.0–36.0)
MCV: 86 fL (ref 80.0–100.0)
Platelets: 215 10*3/uL (ref 150–400)
RBC: 3.72 MIL/uL — ABNORMAL LOW (ref 3.87–5.11)
RDW: 14.4 % (ref 11.5–15.5)
WBC: 11.8 10*3/uL — ABNORMAL HIGH (ref 4.0–10.5)
nRBC: 0 % (ref 0.0–0.2)

## 2021-09-30 LAB — I-STAT VENOUS BLOOD GAS, ED
Acid-base deficit: 1 mmol/L (ref 0.0–2.0)
Bicarbonate: 25.5 mmol/L (ref 20.0–28.0)
Calcium, Ion: 1.3 mmol/L (ref 1.15–1.40)
HCT: 34 % — ABNORMAL LOW (ref 36.0–46.0)
Hemoglobin: 11.6 g/dL — ABNORMAL LOW (ref 12.0–15.0)
O2 Saturation: 25 %
Potassium: 4.4 mmol/L (ref 3.5–5.1)
Sodium: 133 mmol/L — ABNORMAL LOW (ref 135–145)
TCO2: 27 mmol/L (ref 22–32)
pCO2, Ven: 46.8 mmHg (ref 44–60)
pH, Ven: 7.345 (ref 7.25–7.43)
pO2, Ven: 18 mmHg — CL (ref 32–45)

## 2021-09-30 LAB — BRAIN NATRIURETIC PEPTIDE: B Natriuretic Peptide: 62.3 pg/mL (ref 0.0–100.0)

## 2021-09-30 LAB — COMPREHENSIVE METABOLIC PANEL
ALT: 11 U/L (ref 0–44)
AST: 15 U/L (ref 15–41)
Albumin: 4.1 g/dL (ref 3.5–5.0)
Alkaline Phosphatase: 82 U/L (ref 38–126)
Anion gap: 7 (ref 5–15)
BUN: 17 mg/dL (ref 8–23)
CO2: 25 mmol/L (ref 22–32)
Calcium: 10.4 mg/dL — ABNORMAL HIGH (ref 8.9–10.3)
Chloride: 97 mmol/L — ABNORMAL LOW (ref 98–111)
Creatinine, Ser: 1.16 mg/dL — ABNORMAL HIGH (ref 0.44–1.00)
GFR, Estimated: 49 mL/min — ABNORMAL LOW (ref >60)
Glucose, Bld: 108 mg/dL — ABNORMAL HIGH (ref 70–99)
Potassium: 4.2 mmol/L (ref 3.5–5.1)
Sodium: 129 mmol/L — ABNORMAL LOW (ref 135–145)
Total Bilirubin: 0.4 mg/dL (ref 0.3–1.2)
Total Protein: 9.7 g/dL — ABNORMAL HIGH (ref 6.5–8.1)

## 2021-09-30 LAB — RESP PANEL BY RT-PCR (FLU A&B, COVID) ARPGX2
Influenza A by PCR: NEGATIVE
Influenza B by PCR: NEGATIVE
SARS Coronavirus 2 by RT PCR: NEGATIVE

## 2021-09-30 LAB — TROPONIN I (HIGH SENSITIVITY): Troponin I (High Sensitivity): 10 ng/L (ref <18)

## 2021-09-30 MED ORDER — METHYLPREDNISOLONE SODIUM SUCC 125 MG IJ SOLR
125.0000 mg | Freq: Once | INTRAMUSCULAR | Status: AC
  Administered 2021-09-30: 125 mg via INTRAVENOUS
  Filled 2021-09-30: qty 2

## 2021-09-30 MED ORDER — IPRATROPIUM-ALBUTEROL 0.5-2.5 (3) MG/3ML IN SOLN
3.0000 mL | RESPIRATORY_TRACT | Status: AC
  Administered 2021-09-30 (×3): 3 mL via RESPIRATORY_TRACT
  Filled 2021-09-30: qty 9

## 2021-09-30 MED ORDER — LORAZEPAM 1 MG PO TABS
1.0000 mg | ORAL_TABLET | Freq: Once | ORAL | Status: AC
Start: 2021-09-30 — End: 2021-09-30
  Administered 2021-09-30: 1 mg via ORAL
  Filled 2021-09-30: qty 1

## 2021-09-30 MED ORDER — PREDNISONE 10 MG PO TABS: 40.0000 mg | ORAL_TABLET | Freq: Every day | ORAL | 0 refills | Status: DC

## 2021-09-30 MED ORDER — IOHEXOL 350 MG/ML SOLN
60.0000 mL | Freq: Once | INTRAVENOUS | Status: AC | PRN
Start: 2021-09-30 — End: 2021-09-30
  Administered 2021-09-30: 60 mL via INTRAVENOUS

## 2021-09-30 MED ORDER — DOXYCYCLINE HYCLATE 100 MG PO CAPS: 100.0000 mg | ORAL_CAPSULE | Freq: Two times a day (BID) | ORAL | 0 refills | Status: DC

## 2021-09-30 NOTE — ED Notes (Signed)
Pt care taken, assisted patient to bedside commode, minimal results, then assisted with placement of an iv catheter, blood work obtained and patient made comfortable.

## 2021-09-30 NOTE — Telephone Encounter (Signed)
--  Caller states she is calling in regards to her mother. She states she is not doing well. She had a nosebleed yesterday. No bleeding today. She is wondering if she should try to get her in sometime today. States Covid neg but she is not feeling well today. Pt is NOT with the caller at this time and states the pt is at [facility] living facility.  09/30/2021 2:24:40 PM Clinical Call Thad Ranger, RN, [place]

## 2021-09-30 NOTE — ED Provider Notes (Signed)
[place] EMERGENCY DEPT Provider Note   CSN: [id number] Arrival date & time: 09/30/21  1653     History  Chief Complaint  Patient presents with   Shortness of Breath    Kathryn Lewis is a 76 y.o. female.   Shortness of Breath Associated symptoms: cough      76 year old female with medical history significant for dementia, COPD, CKD, HTN, DM 2, pneumonia, depression, has a legal guardian (her daughter who presents bedside), OSA on CPAP, CAD, seizure disorder as a sequelae of CVA who presents to the emergency department with cough and shortness of breath.  [facility]  Per her daughter, she has had a productive cough of sputum and increasing shortness of breath over the past few days.  She also has had some upper respiratory congestion with nasal congestion.  No fevers or chills per her daughter.  Patient has dementia and is unable provide further information.  She has had worsening shortness of breath and productive cough productive of sputum over the past 24 hours.  Home Medications Prior to Admission medications   Medication Sig Start Date End Date Taking? Authorizing Provider  amLODipine (NORVASC) 5 MG tablet Take 1 tablet (5 mg total) by mouth daily. 09/29/21  Yes Martinique, Betty G, MD  ASPIRIN LOW DOSE 81 MG tablet TAKE ONE TABLET BY MOUTH EVERY DAY 07/27/21  Yes Martinique, Betty G, MD  Cholecalciferol (GNP VITAMIN D MAXIMUM STRENGTH) 50 MCG (2000 UT) TABS TAKE ONE TABLET BY MOUTH EVERY DAY 08/28/21  Yes Martinique, Betty G, MD  doxycycline (VIBRAMYCIN) 100 MG capsule Take 1 capsule (100 mg total) by mouth 2 (two) times daily for 5 days. 09/30/21 10/05/21 Yes Regan Lemming, MD  ezetimibe (ZETIA) 10 MG tablet TAKE ONE TABLET BY MOUTH EVERY DAY 09/25/21  Yes Martinique, Betty G, MD  HYDROcodone-acetaminophen (NORCO/VICODIN) 5-325 MG tablet Take 1 tablet by mouth every 6 (six) hours as needed for moderate pain.   Yes [provider]  Insulin Aspart  FlexPen 100 UNIT/ML SOPN INJECT subcutaneously THREE TIMES DAILY PER sliding scales BLOOD SUGAR (150 NO INSULIN)+ 151-180 Give FOUR units. 181-220 Give SIX units, 221-260 Give EIGHT units, 261-300 Give 10 units 301-340 Give 12 units AND > 341 Give 14 units 11/26/19  Yes Martinique, Betty G, MD  isosorbide mononitrate (IMDUR) 30 MG 24 hr tablet TAKE ONE TABLET BY MOUTH EVERY DAY 09/29/21  Yes Martinique, Betty G, MD  lamoTRIgine (LAMICTAL) 200 MG tablet TAKE 1.5 TABLETS BY MOUTH TWICE DAILY 10/21/20  Yes Cameron Sprang, MD  LANTUS SOLOSTAR 100 UNIT/ML Solostar Pen INJECT 20 units AT BEDTIME 09/03/20  Yes Martinique, Betty G, MD  losartan (COZAAR) 50 MG tablet TAKE ONE TABLET BY MOUTH EVERY DAY 09/25/21  Yes Martinique, Betty G, MD  metoprolol tartrate (LOPRESSOR) 50 MG tablet TAKE ONE TABLET BY MOUTH TWICE DAILY 05/29/21  Yes Martinique, Betty G, MD  polyvinyl alcohol (ARTIFICIAL TEARS) 1.4 % ophthalmic solution Place 1 drop into both eyes in the morning and at bedtime. 04/22/21  Yes Martinique, Betty G, MD  predniSONE (DELTASONE) 10 MG tablet Take 4 tablets (40 mg total) by mouth daily for 5 days. 09/30/21 10/05/21 Yes Regan Lemming, MD  rosuvastatin (CRESTOR) 40 MG tablet TAKE ONE TABLET BY MOUTH EVERY DAY 08/04/21  Yes Martinique, Betty G, MD  [facility] HANDIHALER 18 MCG inhalation capsule place ONE capsule into inhaler AND inhale EVERY DAY 07/07/21  Yes Martinique, Betty G, MD  spironolactone (ALDACTONE) 50 MG  tablet TAKE ONE TABLET BY MOUTH EVERY DAY 09/25/21  Yes Martinique, Betty G, MD  albuterol (VENTOLIN HFA) 108 (90 Base) MCG/ACT inhaler Inhale 2 puffs into the lungs every 6 (six) hours as needed for wheezing or shortness of breath. 11/28/20   Martinique, Betty G, MD  clopidogrel (PLAVIX) 75 MG tablet TAKE ONE TABLET BY MOUTH EVERY DAY 03/18/21   Martinique, Betty G, MD  FEROSUL 325 (65 Fe) MG tablet TAKE ONE TABLET BY MOUTH EVERY TWO DAYS with FOUR OUNCE of orange JUICE 02/23/21   Martinique, Betty G, MD  fluticasone [facility]) 50 MCG/ACT nasal spray Place 2  sprays into both nostrils daily. 04/22/17   Nafziger, Tommi Rumps, NP  furosemide (LASIX) 20 MG tablet Take 1 tablet (20 mg total) by mouth daily as needed for edema. 11/28/20   Martinique, Betty G, MD  GNP PAIN RELIEF EX-STRENGTH 500 MG tablet TAKE ONE TABLET BY MOUTH FOUR TIMES DAILY 08/19/21   Martinique, Betty G, MD  guaiFENesin (MUCINEX) 600 MG 12 hr tablet Take 1 tablet (600 mg total) by mouth daily as needed for to loosen phlegm. 12/12/20   Martinique, Betty G, MD  Omega-3 Fatty Acids ([place]ura) 1000 MG CAPS TAKE TWO CAPSULES BY MOUTH EVERY DAY 09/26/20   Martinique, Betty G, MD  phenytoin (DILANTIN) 100 MG ER capsule Take 3 capsules (300 mg total) by mouth daily. 10/21/20   Cameron Sprang, MD  phenytoin (DILANTIN) 30 MG ER capsule Take 1 capsule (30 mg total) by mouth at bedtime. 09/10/21   Pieter Partridge, DO  Spacer/Aero Chamber Mouthpiece MISC 1 Device by Does not apply route daily as needed. 12/24/16   Rigoberto Noel, MD  spironolactone (ALDACTONE) 25 MG tablet Take 1 tablet (25 mg total) by mouth daily. 09/22/21   Martinique, Betty G, MD  tiZANidine (ZANAFLEX) 4 MG tablet TAKE 1/2 TABLET BY MOUTH EVERY 12 HOURS AS NEEDED muscle SPASMS 05/04/21   [place], Betty G, MD      Allergies    Amoxicillin, Augmentin [amoxicillin-pot clavulanate], and Cephalexin    Review of Systems   Review of Systems  Respiratory:  Positive for cough and shortness of breath.   All other systems reviewed and are negative.   Physical Exam Updated Vital Signs BP (!) 173/93   Pulse 87   Temp 97.7 F (36.5 C) (Oral)   Resp 18   Ht '5\' 3"'$  (1.6 m)   Wt 61.7 kg   LMP 03/17/2015   SpO2 94%   BMI 24.10 kg/m  Physical Exam Vitals and nursing note reviewed.  Constitutional:      General: She is not in acute distress.    Appearance: She is well-developed.  HENT:     Head: Normocephalic and atraumatic.  Eyes:     Conjunctiva/sclera: Conjunctivae normal.  Cardiovascular:     Rate and Rhythm: Normal rate and regular rhythm.     Heart  sounds: No murmur heard. Pulmonary:     Effort: Pulmonary effort is normal. Tachypnea present. No respiratory distress.     Breath sounds: Wheezing and rhonchi present.  Abdominal:     Palpations: Abdomen is soft.     Tenderness: There is no abdominal tenderness.  Musculoskeletal:        General: No swelling.     Cervical back: Neck supple.  Skin:    General: Skin is warm and dry.     Capillary Refill: Capillary refill takes less than 2 seconds.  Neurological:     Mental  Status: She is alert.  Psychiatric:        Mood and Affect: Mood normal.     ED Results / Procedures / Treatments   Labs (all labs ordered are listed, but only abnormal results are displayed) Labs Reviewed  COMPREHENSIVE METABOLIC PANEL - Abnormal; Notable for the following components:      Result Value   Sodium 129 (*)    Chloride 97 (*)    Glucose, Bld 108 (*)    Creatinine, Ser 1.16 (*)    Calcium 10.4 (*)    Total Protein 9.7 (*)    GFR, Estimated 49 (*)    All other components within normal limits  CBC - Abnormal; Notable for the following components:   WBC 11.8 (*)    RBC 3.72 (*)    Hemoglobin 10.7 (*)    HCT 32.0 (*)    All other components within normal limits  BASIC METABOLIC PANEL - Abnormal; Notable for the following components:   Sodium 129 (*)    Glucose, Bld 182 (*)    Creatinine, Ser 1.17 (*)    GFR, Estimated 49 (*)    All other components within normal limits  CBC - Abnormal; Notable for the following components:   WBC 13.0 (*)    RBC 3.85 (*)    Hemoglobin 10.8 (*)    HCT 32.6 (*)    All other components within normal limits  GLUCOSE, CAPILLARY - Abnormal; Notable for the following components:   Glucose-Capillary 208 (*)    All other components within normal limits  HEPARIN LEVEL (UNFRACTIONATED) - Abnormal; Notable for the following components:   Heparin Unfractionated 0.15 (*)    All other components within normal limits  HEMOGLOBIN A1C - Abnormal; Notable for the  following components:   Hgb A1c MFr Bld 6.8 (*)    All other components within normal limits  BASIC METABOLIC PANEL - Abnormal; Notable for the following components:   Sodium 130 (*)    CO2 19 (*)    Glucose, Bld 206 (*)    All other components within normal limits  GLUCOSE, CAPILLARY - Abnormal; Notable for the following components:   Glucose-Capillary 210 (*)    All other components within normal limits  GLUCOSE, CAPILLARY - Abnormal; Notable for the following components:   Glucose-Capillary 236 (*)    All other components within normal limits  GLUCOSE, CAPILLARY - Abnormal; Notable for the following components:   Glucose-Capillary 185 (*)    All other components within normal limits  RETICULOCYTES - Abnormal; Notable for the following components:   RBC. 3.79 (*)    Immature Retic Fract 16.7 (*)    All other components within normal limits  I-STAT VENOUS BLOOD GAS, ED - Abnormal; Notable for the following components:   pO2, Ven 18 (*)    Sodium 133 (*)    HCT 34.0 (*)    Hemoglobin 11.6 (*)    All other components within normal limits  TROPONIN I (HIGH SENSITIVITY) - Abnormal; Notable for the following components:   Troponin I (High Sensitivity) 145 (*)    All other components within normal limits  TROPONIN I (HIGH SENSITIVITY) - Abnormal; Notable for the following components:   Troponin I (High Sensitivity) 304 (*)    All other components within normal limits  TROPONIN I (HIGH SENSITIVITY) - Abnormal; Notable for the following components:   Troponin I (High Sensitivity) 328 (*)    All other components within normal limits  TROPONIN I (HIGH  SENSITIVITY) - Abnormal; Notable for the following components:   Troponin I (High Sensitivity) 351 (*)    All other components within normal limits  RESP PANEL BY RT-PCR (FLU A&B, COVID) ARPGX2  BRAIN NATRIURETIC PEPTIDE  LIPID PANEL  MAGNESIUM  PHOSPHORUS  HEPARIN LEVEL (UNFRACTIONATED)  PROCALCITONIN  GLUCOSE, CAPILLARY  FOLATE   FERRITIN  CBC  HEPARIN LEVEL (UNFRACTIONATED)  VITAMIN B12  IRON AND TIBC  CBG MONITORING, ED  TROPONIN I (HIGH SENSITIVITY)    EKG EKG Interpretation  Date/Time:  Wednesday September 30 2021 17:04:10 EDT Ventricular Rate:  88 PR Interval:  144 QRS Duration: 95 QT Interval:  340 QTC Calculation: 412 R Axis:   65 Text Interpretation: Sinus rhythm Borderline repolarization abnormality Confirmed by Regan Lemming ([id number]) on 09/30/2021 6:28:10 PM  Radiology CT Angio Chest PE W and/or Wo Contrast  Result Date: 09/30/2021 CLINICAL DATA:  Concern for pulmonary embolism. EXAM: CT ANGIOGRAPHY CHEST WITH CONTRAST TECHNIQUE: Multidetector CT imaging of the chest was performed using the standard protocol during bolus administration of intravenous contrast. Multiplanar CT image reconstructions and MIPs were obtained to evaluate the vascular anatomy. RADIATION DOSE REDUCTION: This exam was performed according to the departmental dose-optimization program which includes automated exposure control, adjustment of the mA and/or kV according to patient size and/or use of iterative reconstruction technique. CONTRAST:  14m OMNIPAQUE IOHEXOL 350 MG/ML SOLN COMPARISON:  Chest radiograph dated 09/30/2021. FINDINGS: Cardiovascular: There is no cardiomegaly or pericardial effusion. Mild atherosclerotic calcification of the thoracic aorta. No aneurysmal dilatation. The origins of the great vessels of the aortic arch appear patent. No pulmonary artery embolus identified. Mediastinum/Nodes: No hilar or mediastinal adenopathy. Top-normal left hilar lymph node measures 8 mm. The esophagus and the thyroid gland are grossly unremarkable. No mediastinal fluid collection. Lungs/Pleura: The lungs are clear. There is no pleural effusion pneumothorax. The central airways are patent. Upper Abdomen: No acute abnormality. Musculoskeletal: No chest wall abnormality. No acute or significant osseous findings. Review of the MIP images  confirms the above findings. IMPRESSION: 1. No acute intrathoracic pathology. No CT evidence of pulmonary artery embolus. 2. Aortic Atherosclerosis (ICD10-I70.0). Electronically Signed   By: AAnner CreteM.D.   On: 09/30/2021 21:04   DG Chest Port 1 View  Result Date: 09/30/2021 CLINICAL DATA:  Shortness of breath and cough beginning last night. EXAM: PORTABLE CHEST 1 VIEW COMPARISON:  One-view chest x-ray 03/14/2019. FINDINGS: The heart is enlarged. Atherosclerotic calcifications are present at the aorta. A moderate-sized hiatal hernia is noted. Lung volumes are low. Chronic interstitial coarsening is present without superimposed airspace disease. Degenerative changes are noted both shoulders. Cervical spine surgery noted. IMPRESSION: 1. Cardiomegaly without failure. 2. No acute cardiopulmonary disease. 3. Moderate-sized hiatal hernia. Electronically Signed   By: CSan MorelleM.D.   On: 09/30/2021 17:42    Procedures Procedures    Medications Ordered in ED Medications  heparin ADULT infusion 100 units/mL (25000 units/2545m (1,000 Units/hr Intravenous New Bag/Given 10/02/21 0743)  aspirin EC tablet 81 mg (81 mg Oral Given 10/02/21 0855)  clopidogrel (PLAVIX) tablet 75 mg (75 mg Oral Given 10/02/21 0855)  ezetimibe (ZETIA) tablet 10 mg (10 mg Oral Given 10/02/21 0855)  isosorbide mononitrate (IMDUR) 24 hr tablet 30 mg (30 mg Oral Given 10/02/21 0856)  losartan (COZAAR) tablet 50 mg (50 mg Oral Given 10/02/21 0856)  metoprolol tartrate (LOPRESSOR) tablet 50 mg (50 mg Oral Given 10/02/21 0855)  phenytoin (DILANTIN) ER capsule 300 mg (300 mg Oral Given 10/02/21 0855)  phenytoin (DILANTIN)  ER capsule 30 mg (30 mg Oral Given 10/02/21 0048)  rosuvastatin (CRESTOR) tablet 40 mg (40 mg Oral Given 10/02/21 0856)  tiotropium (SPIRIVA) inhalation capsule ([facility] use ONLY) 18 mcg (18 mcg Inhalation Not Given 10/02/21 0746)  spironolactone (ALDACTONE) tablet 25 mg (25 mg Oral Given 10/02/21 0855)  albuterol (PROVENTIL)  (2.5 MG/3ML) 0.083% nebulizer solution 2.5 mg (2.5 mg Nebulization Given 10/01/21 1257)  LORazepam (ATIVAN) injection 2 mg (has no administration in time range)  insulin aspart (novoLOG) injection 0-9 Units (2 Units Subcutaneous Given 10/02/21 0642)  ipratropium-albuterol (DUONEB) 0.5-2.5 (3) MG/3ML nebulizer solution 3 mL (3 mLs Nebulization Given 10/02/21 0745)  azithromycin (ZITHROMAX) 500 mg in sodium chloride 0.9 % 250 mL IVPB (500 mg Intravenous New Bag/Given 10/02/21 0049)  cefTRIAXone (ROCEPHIN) 2 g in sodium chloride 0.9 % 100 mL IVPB (2 g Intravenous New Bag/Given 10/01/21 2327)  acetaminophen (TYLENOL) tablet 650 mg (has no administration in time range)  prochlorperazine (COMPAZINE) injection 10 mg (has no administration in time range)  melatonin tablet 5 mg (has no administration in time range)  polyethylene glycol (MIRALAX / GLYCOLAX) packet 17 g (has no administration in time range)  hydrALAZINE (APRESOLINE) injection 10 mg (has no administration in time range)  guaiFENesin (ROBITUSSIN) 100 MG/5ML liquid 5 mL (5 mLs Oral Given 10/02/21 0854)  phenol (CHLORASEPTIC) mouth spray 1 spray (1 spray Mouth/Throat Given 10/01/21 2307)  amLODipine (NORVASC) tablet 10 mg (10 mg Oral Given 10/02/21 0855)  insulin glargine-yfgn (SEMGLEE) injection 15 Units (has no administration in time range)  ipratropium-albuterol (DUONEB) 0.5-2.5 (3) MG/3ML nebulizer solution 3 mL (3 mLs Nebulization Given 09/30/21 1954)  methylPREDNISolone sodium succinate (SOLU-MEDROL) 125 mg/2 mL injection 125 mg (125 mg Intravenous Given 09/30/21 1943)  LORazepam (ATIVAN) tablet 1 mg (1 mg Oral Given 09/30/21 1943)  iohexol (OMNIPAQUE) 350 MG/ML injection 60 mL (60 mLs Intravenous Contrast Given 09/30/21 2045)  aspirin chewable tablet 324 mg (324 mg Oral Given 10/01/21 0048)  heparin bolus via infusion 3,500 Units (3,500 Units Intravenous Bolus from Bag 10/01/21 0748)  methylPREDNISolone sodium succinate (SOLU-MEDROL) 40 mg/mL injection 40 mg (40  mg Intravenous Given 10/01/21 2306)    ED Course/ Medical Decision Making/ A&P                           Medical Decision Making Amount and/or Complexity of Data Reviewed Labs: ordered. Radiology: ordered.  Risk OTC drugs. Prescription drug management. Decision regarding hospitalization.     76 year old female with medical history significant for dementia, COPD, CKD, HTN, DM 2, pneumonia, depression, has a legal guardian (her daughter who presents bedside), OSA on CPAP, CAD, seizure disorder as a sequelae of CVA who presents to the emergency department with cough and shortness of breath.  The [facility] resides [facility].  Per her daughter, she has had a productive cough of sputum and increasing shortness of breath over the past few days.  She also has had some upper respiratory congestion with nasal congestion.  No fevers or chills per her daughter.  Patient has dementia and is unable provide further information.  She has had worsening shortness of breath and productive cough productive of sputum over the past 24 hours.  On arrival, the patient was afebrile, not tachycardic or tachypneic, hypertensive BP 173/73, 98% saturations on room air with occasional desaturations while coughing down to 86 to 88%.  Physical exam significant for diffuse wheezing and rhonchi present, mild tachypnea.  Differential diagnosis includes viral  URI, COPD exacerbation, COVID-19, pneumonia, pneumothorax, less likely ACS or PE but considered.  EKG revealed sinus rhythm, rate 88, borderline repolarization abnormality, no STEMI.  A CXR did not reveal an acute cardiac or pulmonary abnormality. CTA PE study ordered:  IMPRESSION:  1. No acute intrathoracic pathology. No CT evidence of pulmonary  artery embolus.  2. Aortic Atherosclerosis (ICD10-I70.0).   An initial troponin was pending. Pt denies any pain at this time. Treated for potential COPD exacerbation. Repeat troponin pending at time of signout.  Signout given to Dr. Karle Starch at 2330. 2nd troponin resulted positive and the patient was admitted for NSTEMI.    Final Clinical Impression(s) / ED Diagnoses Final diagnoses:  Viral URI  COPD exacerbation ([place])  NSTEMI (non-ST elevated myocardial infarction) ([place])    Rx / DC Orders ED Discharge Orders          Ordered    predniSONE (DELTASONE) 10 MG tablet  Daily        09/30/21 2338    doxycycline (VIBRAMYCIN) 100 MG capsule  2 times daily        09/30/21 2338              Regan Lemming, MD 10/02/21 1041

## 2021-09-30 NOTE — ED Notes (Signed)
Back from ct.

## 2021-09-30 NOTE — ED Triage Notes (Signed)
Pt arrives to ED with c/o shortness of breath and cough that started last night. SOB continued throughout the night and worsened today.

## 2021-09-30 NOTE — ED Notes (Addendum)
Pt denis pain. Pt is oriented to herself and dob but not to location and month. Family states that Pt is at baseline cognitively and deficits are due to a past stroke. Pt  is DNR and on palliative care. RT notified and responded

## 2021-09-30 NOTE — Discharge Instructions (Addendum)
Your symptoms are very consistent with a viral URI which resulted in a COPD exacerbation.  We will treat with steroids and a course of doxycycline.

## 2021-10-01 DIAGNOSIS — Z8249 Family history of ischemic heart disease and other diseases of the circulatory system: Secondary | ICD-10-CM | POA: Diagnosis not present

## 2021-10-01 DIAGNOSIS — E1165 Type 2 diabetes mellitus with hyperglycemia: Secondary | ICD-10-CM | POA: Diagnosis present

## 2021-10-01 DIAGNOSIS — R7989 Other specified abnormal findings of blood chemistry: Secondary | ICD-10-CM | POA: Diagnosis present

## 2021-10-01 DIAGNOSIS — I10 Essential (primary) hypertension: Secondary | ICD-10-CM

## 2021-10-01 DIAGNOSIS — Z66 Do not resuscitate: Secondary | ICD-10-CM | POA: Diagnosis not present

## 2021-10-01 DIAGNOSIS — E785 Hyperlipidemia, unspecified: Secondary | ICD-10-CM | POA: Diagnosis present

## 2021-10-01 DIAGNOSIS — Z87891 Personal history of nicotine dependence: Secondary | ICD-10-CM | POA: Diagnosis not present

## 2021-10-01 DIAGNOSIS — Z20822 Contact with and (suspected) exposure to covid-19: Secondary | ICD-10-CM | POA: Diagnosis present

## 2021-10-01 DIAGNOSIS — I2510 Atherosclerotic heart disease of native coronary artery without angina pectoris: Secondary | ICD-10-CM | POA: Diagnosis present

## 2021-10-01 DIAGNOSIS — F0390 Unspecified dementia without behavioral disturbance: Secondary | ICD-10-CM | POA: Diagnosis present

## 2021-10-01 DIAGNOSIS — N1832 Chronic kidney disease, stage 3b: Secondary | ICD-10-CM | POA: Diagnosis present

## 2021-10-01 DIAGNOSIS — J069 Acute upper respiratory infection, unspecified: Secondary | ICD-10-CM | POA: Diagnosis present

## 2021-10-01 DIAGNOSIS — I69398 Other sequelae of cerebral infarction: Secondary | ICD-10-CM | POA: Diagnosis not present

## 2021-10-01 DIAGNOSIS — E1149 Type 2 diabetes mellitus with other diabetic neurological complication: Secondary | ICD-10-CM | POA: Diagnosis present

## 2021-10-01 DIAGNOSIS — J441 Chronic obstructive pulmonary disease with (acute) exacerbation: Secondary | ICD-10-CM | POA: Diagnosis present

## 2021-10-01 DIAGNOSIS — N1831 Chronic kidney disease, stage 3a: Secondary | ICD-10-CM | POA: Diagnosis not present

## 2021-10-01 DIAGNOSIS — I25119 Atherosclerotic heart disease of native coronary artery with unspecified angina pectoris: Secondary | ICD-10-CM | POA: Diagnosis not present

## 2021-10-01 DIAGNOSIS — Z794 Long term (current) use of insulin: Secondary | ICD-10-CM | POA: Diagnosis not present

## 2021-10-01 DIAGNOSIS — I130 Hypertensive heart and chronic kidney disease with heart failure and stage 1 through stage 4 chronic kidney disease, or unspecified chronic kidney disease: Secondary | ICD-10-CM | POA: Diagnosis present

## 2021-10-01 DIAGNOSIS — R778 Other specified abnormalities of plasma proteins: Secondary | ICD-10-CM | POA: Diagnosis present

## 2021-10-01 DIAGNOSIS — N179 Acute kidney failure, unspecified: Secondary | ICD-10-CM | POA: Diagnosis not present

## 2021-10-01 DIAGNOSIS — E872 Acidosis, unspecified: Secondary | ICD-10-CM | POA: Diagnosis not present

## 2021-10-01 DIAGNOSIS — Z823 Family history of stroke: Secondary | ICD-10-CM | POA: Diagnosis not present

## 2021-10-01 DIAGNOSIS — F319 Bipolar disorder, unspecified: Secondary | ICD-10-CM | POA: Diagnosis present

## 2021-10-01 DIAGNOSIS — E871 Hypo-osmolality and hyponatremia: Secondary | ICD-10-CM | POA: Diagnosis present

## 2021-10-01 DIAGNOSIS — E1151 Type 2 diabetes mellitus with diabetic peripheral angiopathy without gangrene: Secondary | ICD-10-CM | POA: Diagnosis present

## 2021-10-01 DIAGNOSIS — E1122 Type 2 diabetes mellitus with diabetic chronic kidney disease: Secondary | ICD-10-CM | POA: Diagnosis present

## 2021-10-01 DIAGNOSIS — G40909 Epilepsy, unspecified, not intractable, without status epilepticus: Secondary | ICD-10-CM | POA: Diagnosis present

## 2021-10-01 DIAGNOSIS — Z79899 Other long term (current) drug therapy: Secondary | ICD-10-CM | POA: Diagnosis not present

## 2021-10-01 DIAGNOSIS — I5032 Chronic diastolic (congestive) heart failure: Secondary | ICD-10-CM | POA: Diagnosis present

## 2021-10-01 LAB — GLUCOSE, CAPILLARY
Glucose-Capillary: 208 mg/dL — ABNORMAL HIGH (ref 70–99)
Glucose-Capillary: 96 mg/dL (ref 70–99)

## 2021-10-01 LAB — BASIC METABOLIC PANEL
Anion gap: 9 (ref 5–15)
BUN: 18 mg/dL (ref 8–23)
CO2: 22 mmol/L (ref 22–32)
Calcium: 10.2 mg/dL (ref 8.9–10.3)
Chloride: 98 mmol/L (ref 98–111)
Creatinine, Ser: 1.17 mg/dL — ABNORMAL HIGH (ref 0.44–1.00)
GFR, Estimated: 49 mL/min — ABNORMAL LOW (ref >60)
Glucose, Bld: 182 mg/dL — ABNORMAL HIGH (ref 70–99)
Potassium: 4.4 mmol/L (ref 3.5–5.1)
Sodium: 129 mmol/L — ABNORMAL LOW (ref 135–145)

## 2021-10-01 LAB — CBC
HCT: 32.6 % — ABNORMAL LOW (ref 36.0–46.0)
Hemoglobin: 10.8 g/dL — ABNORMAL LOW (ref 12.0–15.0)
MCH: 28.1 pg (ref 26.0–34.0)
MCHC: 33.1 g/dL (ref 30.0–36.0)
MCV: 84.7 fL (ref 80.0–100.0)
Platelets: 241 10*3/uL (ref 150–400)
RBC: 3.85 MIL/uL — ABNORMAL LOW (ref 3.87–5.11)
RDW: 14.2 % (ref 11.5–15.5)
WBC: 13 10*3/uL — ABNORMAL HIGH (ref 4.0–10.5)
nRBC: 0 % (ref 0.0–0.2)

## 2021-10-01 LAB — CBG MONITORING, ED: Glucose-Capillary: 92 mg/dL (ref 70–99)

## 2021-10-01 LAB — TROPONIN I (HIGH SENSITIVITY)
Troponin I (High Sensitivity): 145 ng/L (ref <18)
Troponin I (High Sensitivity): 304 ng/L (ref <18)
Troponin I (High Sensitivity): 328 ng/L (ref <18)

## 2021-10-01 LAB — HEPARIN LEVEL (UNFRACTIONATED): Heparin Unfractionated: 0.15 IU/mL — ABNORMAL LOW (ref 0.30–0.70)

## 2021-10-01 MED ORDER — ISOSORBIDE MONONITRATE ER 30 MG PO TB24
30.0000 mg | ORAL_TABLET | Freq: Every day | ORAL | Status: DC
  Administered 2021-10-02 – 2021-10-09 (×8): 30 mg via ORAL
  Filled 2021-10-01 (×9): qty 1

## 2021-10-01 MED ORDER — ALBUTEROL SULFATE (2.5 MG/3ML) 0.083% IN NEBU
2.5000 mg | INHALATION_SOLUTION | Freq: Four times a day (QID) | RESPIRATORY_TRACT | Status: DC | PRN
Start: 2021-10-01 — End: 2021-10-09
  Administered 2021-10-01: 2.5 mg via RESPIRATORY_TRACT
  Filled 2021-10-01: qty 3

## 2021-10-01 MED ORDER — INSULIN ASPART 100 UNIT/ML IJ SOLN
0.0000 [IU] | INTRAMUSCULAR | Status: DC
  Administered 2021-10-02 (×2): 2 [IU] via SUBCUTANEOUS
  Administered 2021-10-02 (×2): 3 [IU] via SUBCUTANEOUS
  Administered 2021-10-03 (×2): 2 [IU] via SUBCUTANEOUS
  Administered 2021-10-03: 7 [IU] via SUBCUTANEOUS
  Administered 2021-10-04: 5 [IU] via SUBCUTANEOUS
  Administered 2021-10-04 (×2): 1 [IU] via SUBCUTANEOUS
  Administered 2021-10-04: 2 [IU] via SUBCUTANEOUS
  Administered 2021-10-05: 5 [IU] via SUBCUTANEOUS
  Administered 2021-10-05 (×3): 2 [IU] via SUBCUTANEOUS
  Administered 2021-10-06 (×2): 1 [IU] via SUBCUTANEOUS
  Administered 2021-10-06: 3 [IU] via SUBCUTANEOUS
  Administered 2021-10-06: 5 [IU] via SUBCUTANEOUS
  Administered 2021-10-06 – 2021-10-07 (×2): 2 [IU] via SUBCUTANEOUS
  Administered 2021-10-07: 3 [IU] via SUBCUTANEOUS
  Administered 2021-10-07 (×2): 2 [IU] via SUBCUTANEOUS
  Administered 2021-10-07 – 2021-10-08 (×2): 1 [IU] via SUBCUTANEOUS
  Administered 2021-10-08: 9 [IU] via SUBCUTANEOUS
  Administered 2021-10-08 – 2021-10-09 (×2): 5 [IU] via SUBCUTANEOUS
  Administered 2021-10-09: 2 [IU] via SUBCUTANEOUS

## 2021-10-01 MED ORDER — GUAIFENESIN 100 MG/5ML PO LIQD
5.0000 mL | ORAL | Status: DC | PRN
  Administered 2021-10-01 – 2021-10-02 (×2): 5 mL via ORAL
  Filled 2021-10-01 (×2): qty 10

## 2021-10-01 MED ORDER — ACETAMINOPHEN 325 MG PO TABS
650.0000 mg | ORAL_TABLET | Freq: Four times a day (QID) | ORAL | Status: DC | PRN
  Administered 2021-10-06 – 2021-10-07 (×2): 650 mg via ORAL
  Filled 2021-10-01 (×2): qty 2

## 2021-10-01 MED ORDER — CLOPIDOGREL BISULFATE 75 MG PO TABS
75.0000 mg | ORAL_TABLET | Freq: Every day | ORAL | Status: DC
  Administered 2021-10-01 – 2021-10-09 (×9): 75 mg via ORAL
  Filled 2021-10-01 (×9): qty 1

## 2021-10-01 MED ORDER — HYDRALAZINE HCL 20 MG/ML IJ SOLN
10.0000 mg | Freq: Four times a day (QID) | INTRAMUSCULAR | Status: DC | PRN
  Administered 2021-10-03 – 2021-10-06 (×4): 10 mg via INTRAVENOUS
  Filled 2021-10-01 (×4): qty 1

## 2021-10-01 MED ORDER — INSULIN GLARGINE-YFGN 100 UNIT/ML ~~LOC~~ SOLN
20.0000 [IU] | Freq: Every day | SUBCUTANEOUS | Status: DC
  Filled 2021-10-01 (×2): qty 0.2

## 2021-10-01 MED ORDER — ROSUVASTATIN CALCIUM 20 MG PO TABS
40.0000 mg | ORAL_TABLET | Freq: Every day | ORAL | Status: DC
  Administered 2021-10-02 – 2021-10-09 (×8): 40 mg via ORAL
  Filled 2021-10-01: qty 1
  Filled 2021-10-01 (×8): qty 2

## 2021-10-01 MED ORDER — AMLODIPINE BESYLATE 10 MG PO TABS
10.0000 mg | ORAL_TABLET | Freq: Every day | ORAL | Status: DC
  Administered 2021-10-02 – 2021-10-09 (×8): 10 mg via ORAL
  Filled 2021-10-01 (×8): qty 1

## 2021-10-01 MED ORDER — SODIUM CHLORIDE 0.9 % IV SOLN
2.0000 g | INTRAVENOUS | Status: AC
  Administered 2021-10-01 – 2021-10-05 (×5): 2 g via INTRAVENOUS
  Filled 2021-10-01 (×5): qty 20

## 2021-10-01 MED ORDER — HEPARIN BOLUS VIA INFUSION
3500.0000 [IU] | Freq: Once | INTRAVENOUS | Status: AC
  Administered 2021-10-01: 3500 [IU] via INTRAVENOUS

## 2021-10-01 MED ORDER — HEPARIN (PORCINE) 25000 UT/250ML-% IV SOLN
1000.0000 [IU]/h | INTRAVENOUS | Status: DC
  Administered 2021-10-01: 850 [IU]/h via INTRAVENOUS
  Administered 2021-10-02: 1000 [IU]/h via INTRAVENOUS
  Filled 2021-10-01 (×2): qty 250

## 2021-10-01 MED ORDER — METOPROLOL TARTRATE 50 MG PO TABS
50.0000 mg | ORAL_TABLET | Freq: Two times a day (BID) | ORAL | Status: DC
  Administered 2021-10-01 – 2021-10-09 (×17): 50 mg via ORAL
  Filled 2021-10-01 (×4): qty 1
  Filled 2021-10-01: qty 2
  Filled 2021-10-01 (×12): qty 1

## 2021-10-01 MED ORDER — SPIRONOLACTONE 25 MG PO TABS
25.0000 mg | ORAL_TABLET | Freq: Every day | ORAL | Status: DC
  Administered 2021-10-01 – 2021-10-09 (×9): 25 mg via ORAL
  Filled 2021-10-01 (×9): qty 1

## 2021-10-01 MED ORDER — SODIUM CHLORIDE 0.9 % IV SOLN
2.0000 g | INTRAVENOUS | Status: DC
  Filled 2021-10-01: qty 20

## 2021-10-01 MED ORDER — METHYLPREDNISOLONE SODIUM SUCC 40 MG IJ SOLR
40.0000 mg | INTRAMUSCULAR | Status: AC
  Administered 2021-10-01: 40 mg via INTRAVENOUS
  Filled 2021-10-01: qty 1

## 2021-10-01 MED ORDER — LORAZEPAM 2 MG/ML IJ SOLN
2.0000 mg | Freq: Four times a day (QID) | INTRAMUSCULAR | Status: DC | PRN
  Administered 2021-10-03: 2 mg via INTRAVENOUS
  Filled 2021-10-01: qty 1

## 2021-10-01 MED ORDER — POLYETHYLENE GLYCOL 3350 17 G PO PACK
17.0000 g | PACK | Freq: Every day | ORAL | Status: DC | PRN
  Administered 2021-10-09: 17 g via ORAL
  Filled 2021-10-01: qty 1

## 2021-10-01 MED ORDER — EZETIMIBE 10 MG PO TABS
10.0000 mg | ORAL_TABLET | Freq: Every day | ORAL | Status: DC
  Administered 2021-10-02 – 2021-10-09 (×8): 10 mg via ORAL
  Filled 2021-10-01 (×9): qty 1

## 2021-10-01 MED ORDER — PHENYTOIN SODIUM EXTENDED 100 MG PO CAPS
300.0000 mg | ORAL_CAPSULE | Freq: Every day | ORAL | Status: DC
  Administered 2021-10-01 – 2021-10-09 (×9): 300 mg via ORAL
  Filled 2021-10-01 (×10): qty 3

## 2021-10-01 MED ORDER — MELATONIN 5 MG PO TABS
5.0000 mg | ORAL_TABLET | Freq: Every evening | ORAL | Status: DC | PRN
  Administered 2021-10-03 – 2021-10-05 (×3): 5 mg via ORAL
  Filled 2021-10-01 (×3): qty 1

## 2021-10-01 MED ORDER — PHENYTOIN SODIUM EXTENDED 30 MG PO CAPS
30.0000 mg | ORAL_CAPSULE | Freq: Every day | ORAL | Status: DC
  Administered 2021-10-02 – 2021-10-08 (×8): 30 mg via ORAL
  Filled 2021-10-01 (×10): qty 1

## 2021-10-01 MED ORDER — ASPIRIN 81 MG PO CHEW
324.0000 mg | CHEWABLE_TABLET | Freq: Once | ORAL | Status: AC
  Administered 2021-10-01: 324 mg via ORAL
  Filled 2021-10-01: qty 4

## 2021-10-01 MED ORDER — PHENOL 1.4 % MT LIQD
1.0000 | OROMUCOSAL | Status: DC | PRN
Start: 2021-10-01 — End: 2021-10-09
  Administered 2021-10-01 – 2021-10-06 (×2): 1 via OROMUCOSAL
  Filled 2021-10-01: qty 177

## 2021-10-01 MED ORDER — IPRATROPIUM-ALBUTEROL 0.5-2.5 (3) MG/3ML IN SOLN
3.0000 mL | Freq: Four times a day (QID) | RESPIRATORY_TRACT | Status: DC
  Administered 2021-10-01 – 2021-10-06 (×17): 3 mL via RESPIRATORY_TRACT
  Filled 2021-10-01 (×19): qty 3

## 2021-10-01 MED ORDER — TIOTROPIUM BROMIDE MONOHYDRATE 18 MCG IN CAPS
18.0000 ug | ORAL_CAPSULE | Freq: Every day | RESPIRATORY_TRACT | Status: DC
  Filled 2021-10-01: qty 5

## 2021-10-01 MED ORDER — AMLODIPINE BESYLATE 5 MG PO TABS
5.0000 mg | ORAL_TABLET | Freq: Every day | ORAL | Status: DC
Start: 2021-10-01 — End: 2021-10-01
  Administered 2021-10-01: 5 mg via ORAL
  Filled 2021-10-01: qty 1

## 2021-10-01 MED ORDER — LOSARTAN POTASSIUM 50 MG PO TABS
50.0000 mg | ORAL_TABLET | Freq: Every day | ORAL | Status: DC
Start: 2021-10-01 — End: 2021-10-03
  Administered 2021-10-01 – 2021-10-02 (×2): 50 mg via ORAL
  Filled 2021-10-01: qty 1
  Filled 2021-10-01: qty 2

## 2021-10-01 MED ORDER — ALBUTEROL SULFATE HFA 108 (90 BASE) MCG/ACT IN AERS: 2.0000 | INHALATION_SPRAY | Freq: Four times a day (QID) | RESPIRATORY_TRACT | Status: DC | PRN

## 2021-10-01 MED ORDER — ASPIRIN 81 MG PO TBEC
81.0000 mg | DELAYED_RELEASE_TABLET | Freq: Every day | ORAL | Status: DC
  Administered 2021-10-01 – 2021-10-09 (×9): 81 mg via ORAL
  Filled 2021-10-01 (×9): qty 1

## 2021-10-01 MED ORDER — PROCHLORPERAZINE EDISYLATE 10 MG/2ML IJ SOLN
10.0000 mg | Freq: Four times a day (QID) | INTRAMUSCULAR | Status: DC | PRN
Start: 2021-10-01 — End: 2021-10-09

## 2021-10-01 MED ORDER — SODIUM CHLORIDE 0.9 % IV SOLN
500.0000 mg | INTRAVENOUS | Status: AC
  Administered 2021-10-02 – 2021-10-03 (×3): 500 mg via INTRAVENOUS
  Filled 2021-10-01 (×3): qty 5

## 2021-10-01 NOTE — Consult Note (Addendum)
Cardiology Consultation   Patient ID: Kathryn Lewis MRN: [id number]; DOB: [birth year]  Admit date: 09/30/2021 Date of Consult: 10/01/2021  PCP:  [place], Kathryn Lewis Providers Cardiologist:  Dorris Carnes, MD  Cardiology APP:  Liliane Shi, PA-C     Patient Profile:   Kathryn Lewis is a 76 y.o. female with a hx of  prior MI, previously followed by Dr. Randol Kern in [place], [place], [place] w/ Dr. Harrington Challenger during admx for L arm pain and HTN urgency in 2017.  Prior cath (date unknown): LAD - p50, m80, d90; D1 p60, d80; mLCx 40, mOM1 80, RCA - p20, m70, d20, EF 65, Prior CVA, Subsequent seizures (Dr. Delice Lesch), Peripheral arterial disease, CTA >>mild to mod dz in CIA, bilat EIA occluded, bilat SFA dz, Notes indicate she is not a candidate for intervention (per [employer], [place]), Carotid artery disease, [place] (date unknown) w/ Bilat 50-69%, DM, HTN, HLD, bipolar, COPD.  She is being seen 10/01/2021 for the evaluation of elevated troponin at the request of Dr Karle Starch.  History of Present Illness:   [name] is able to walk around the house, but her activity level is not high. She has not been having any chest pain w/ activity.   She admits that her memory is very bad and cannot recall events well at all.  She does not know how long ago she started getting short of breath.  She does not know how long she has been coughing.  However, she has been coughing long enough to make her abdominal muscles sore.  She does not remember having any chest pain when her breathing started getting worse.  She does not know if she had any fevers and she does not know if her cough is productive.  However, notes indicate that she has produced sputum.  She also has nasal congestion.  She is coughing very hard and very frequently, but says this is not as bad as it was when she came in.  She is a resident at [facility] and her daughter is very involved in her care.  However,  her daughter is not currently present.   Past Medical History:  Diagnosis Date   Allergy    Arthritis    Bipolar 1 disorder ([place])    Blood transfusion without reported diagnosis    Carotid artery stenosis    50-69% bilateral ICA stenoses by velocity criteria but no visible plaque and ICA/CCA ratio 1.72 on right and 1.38 on left   Coronary artery disease    Depression    Diabetes mellitus without complication (HCC)    Echocardiogram    Echo 06/2018: Hyperdynamic systolic function, EF >99, mild concentric LVH, elevated LVEDP (E/E' suggests impaired relaxation), mild AI, lipomatous interatrial septum   Hypertension    Left adrenal mass (HCC)    2.5 cm L adrenal mass on CT per note of Dr. Seleta Rhymes 05/20/2014   MI, old    PAD (peripheral artery disease) ([place])    03/2014 - CT aortogram with lower extremity runoff (mild to moderate disease in common iliac arteries, complete occulusion of her bilateral external iliac arteries with heavily disease common femoral arteries. She also has disease to her SFAs bilaterally. Popliteal arteries and tibial vessel runoff appears to be adequate)   Seizures ([place])    Stroke [facility])     Past Surgical History:  Procedure Laterality Date   BREAST SURGERY  2012   small lump  nonmilignant   NECK SURGERY       Home Medications:  Prior to Admission medications   Medication Sig Start Date End Date Taking? Authorizing Provider  amLODipine (NORVASC) 5 MG tablet Take 1 tablet (5 mg total) by mouth daily. 09/29/21  Yes [place], Kathryn G, MD  ASPIRIN LOW DOSE 81 MG tablet TAKE ONE TABLET BY MOUTH EVERY DAY 07/27/21  Yes [place], Kathryn G, MD  Cholecalciferol (GNP VITAMIN D MAXIMUM STRENGTH) 50 MCG (2000 UT) TABS TAKE ONE TABLET BY MOUTH EVERY DAY 08/28/21  Yes [place], Kathryn G, MD  doxycycline (VIBRAMYCIN) 100 MG capsule Take 1 capsule (100 mg total) by mouth 2 (two) times daily for 5 days. 09/30/21 10/05/21 Yes Regan Lemming, MD  ezetimibe (ZETIA) 10 MG tablet TAKE ONE TABLET BY  MOUTH EVERY DAY 09/25/21  Yes [place], Kathryn G, MD  HYDROcodone-acetaminophen (NORCO/VICODIN) 5-325 MG tablet Take 1 tablet by mouth every 6 (six) hours as needed for moderate pain.   Yes [provider]  Insulin Aspart FlexPen 100 UNIT/ML SOPN INJECT subcutaneously THREE TIMES DAILY PER sliding scales BLOOD SUGAR (150 NO INSULIN)+ 151-180 Give FOUR units. 181-220 Give SIX units, 221-260 Give EIGHT units, 261-300 Give 10 units 301-340 Give 12 units AND > 341 Give 14 units 11/26/19  Yes [place], Kathryn G, MD  isosorbide mononitrate (IMDUR) 30 MG 24 hr tablet TAKE ONE TABLET BY MOUTH EVERY DAY 09/29/21  Yes [place], Kathryn G, MD  lamoTRIgine (LAMICTAL) 200 MG tablet TAKE 1.5 TABLETS BY MOUTH TWICE DAILY 10/21/20  Yes Cameron Sprang, MD  LANTUS SOLOSTAR 100 UNIT/ML Solostar Pen INJECT 20 units AT BEDTIME 09/03/20  Yes [place], Kathryn G, MD  losartan (COZAAR) 50 MG tablet TAKE ONE TABLET BY MOUTH EVERY DAY 09/25/21  Yes [place], Kathryn G, MD  metoprolol tartrate (LOPRESSOR) 50 MG tablet TAKE ONE TABLET BY MOUTH TWICE DAILY 05/29/21  Yes [place], Kathryn G, MD  polyvinyl alcohol (ARTIFICIAL TEARS) 1.4 % ophthalmic solution Place 1 drop into both eyes in the morning and at bedtime. 04/22/21  Yes [place], Kathryn G, MD  predniSONE (DELTASONE) 10 MG tablet Take 4 tablets (40 mg total) by mouth daily for 5 days. 09/30/21 10/05/21 Yes Regan Lemming, MD  rosuvastatin (CRESTOR) 40 MG tablet TAKE ONE TABLET BY MOUTH EVERY DAY 08/04/21  Yes [place], Kathryn G, MD  SPIRIVA HANDIHALER 18 MCG inhalation capsule place ONE capsule into inhaler AND inhale EVERY DAY 07/07/21  Yes [place], Kathryn G, MD  spironolactone (ALDACTONE) 50 MG tablet TAKE ONE TABLET BY MOUTH EVERY DAY 09/25/21  Yes [place], Kathryn G, MD  albuterol (VENTOLIN HFA) 108 (90 Base) MCG/ACT inhaler Inhale 2 puffs into the lungs every 6 (six) hours as needed for wheezing or shortness of breath. 11/28/20   [place], Kathryn G, MD  clopidogrel (PLAVIX) 75 MG tablet TAKE ONE TABLET BY MOUTH  EVERY DAY 03/18/21   [place], Kathryn G, MD  FEROSUL 325 (65 Fe) MG tablet TAKE ONE TABLET BY MOUTH EVERY TWO DAYS with FOUR OUNCE of orange JUICE 02/23/21   [place], Kathryn G, MD  fluticasone [facility]) 50 MCG/ACT nasal spray Place 2 sprays into both nostrils daily. 04/22/17   Nafziger, Tommi Rumps, NP  furosemide (LASIX) 20 MG tablet Take 1 tablet (20 mg total) by mouth daily as needed for edema. 11/28/20   [place], Kathryn G, MD  GNP PAIN RELIEF EX-STRENGTH 500 MG tablet TAKE ONE TABLET BY MOUTH FOUR TIMES DAILY 08/19/21   [place], Kathryn G, MD  guaiFENesin [facility]) 600 MG  12 hr tablet Take 1 tablet (600 mg total) by mouth daily as needed for to loosen phlegm. 12/12/20   [place], Kathryn G, MD  Omega-3 Fatty Acids ([place]) 1000 MG CAPS TAKE TWO CAPSULES BY MOUTH EVERY DAY 09/26/20   [place], Kathryn G, MD  phenytoin (DILANTIN) 100 MG ER capsule Take 3 capsules (300 mg total) by mouth daily. 10/21/20   Cameron Sprang, MD  phenytoin (DILANTIN) 30 MG ER capsule Take 1 capsule (30 mg total) by mouth at bedtime. 09/10/21   Pieter Partridge, DO  Spacer/Aero Chamber Mouthpiece MISC 1 Device by Does not apply route daily as needed. 12/24/16   Rigoberto Noel, MD  spironolactone (ALDACTONE) 25 MG tablet Take 1 tablet (25 mg total) by mouth daily. 09/22/21   [place], Kathryn G, MD  tiZANidine (ZANAFLEX) 4 MG tablet TAKE 1/2 TABLET BY MOUTH EVERY 12 HOURS AS NEEDED muscle SPASMS 05/04/21   [place], Kathryn G, MD    Inpatient Medications: Scheduled Meds:  amLODipine  5 mg Oral Daily   aspirin EC  81 mg Oral Daily   clopidogrel  75 mg Oral Daily   ezetimibe  10 mg Oral Daily   insulin glargine-yfgn  20 Units Subcutaneous QHS   isosorbide mononitrate  30 mg Oral Daily   losartan  50 mg Oral Daily   metoprolol tartrate  50 mg Oral BID   phenytoin  30 mg Oral QHS   phenytoin  300 mg Oral Daily   rosuvastatin  40 mg Oral Daily   spironolactone  25 mg Oral Daily   tiotropium  18 mcg Inhalation Daily   Continuous Infusions:  heparin 850  Units/hr (10/01/21 0748)   PRN Meds: albuterol, LORazepam  Allergies:    Allergies  Allergen Reactions   Amoxicillin Other (See Comments)    colitis   Augmentin [Amoxicillin-Pot Clavulanate] Other (See Comments)    colitis   Cephalexin Other (See Comments)    colitis    Social History:   Social History   Socioeconomic History   Marital status: Single    Spouse name: Not on file   Number of children: 2   Years of education: Not on file   Highest education level: Not on file  Occupational History   Not on file  Tobacco Use   Smoking status: Former    Packs/day: 1.00    Types: Cigarettes    Quit date: 2015    Years since quitting: 8.6   Smokeless tobacco: Never  Vaping Use   Vaping Use: Never used  Substance and Sexual Activity   Alcohol use: No   Drug use: No   Sexual activity: Not on file  Other Topics Concern   Not on file  Social History Narrative   Pt lives in assisted living facility   Has 2 adult children, she is widowed, husband died in his early 44's in a work related accident   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   Social Determinants of [place] Strain: [place]  (11/27/2019)   Overall Financial Resource Strain (CARDIA)    Difficulty of Paying Living Expenses: Not hard at all  Food Insecurity: No [place] (11/27/2019)   Hunger Vital Sign    Worried About Running Out of Food in the Last Year: Never true    [place] in the Last Year: Never true  Transportation Needs: No Transportation Needs (11/27/2019)   [place] - Transportation  Lack of Transportation (Medical): No    Lack of Transportation (Non-Medical): No  Physical Activity: Inactive (11/27/2019)   Exercise Vital Sign    Days of Exercise per Week: 0 days    Minutes of Exercise per Session: 0 min  Stress: No Stress Concern Present (11/27/2019)   [place]    Feeling of Stress : Not at all   Social Connections: Socially Isolated (11/27/2019)   Social Connection and Isolation Panel [NHANES]    Frequency of Communication with Friends and Family: More than three times a week    Frequency of Social Gatherings with Friends and Family: More than three times a week    Attends Religious Services: Never    Marine scientist or Organizations: No    Attends Archivist Meetings: Never    Marital Status: Widowed  Intimate Partner Violence: Not At Risk (11/27/2019)   Humiliation, Afraid, Rape, and Kick questionnaire    Fear of Current or Ex-Partner: No    Emotionally Abused: No    Physically Abused: No    Sexually Abused: No    Family History:   Family History  Problem Relation Age of Onset   Hypertension Mother    Arthritis Mother    Diabetes Mother    Early death Mother    Hyperlipidemia Mother    Stroke Mother    Hypertension Father    Alcohol abuse Father    Early death Father    Hyperlipidemia Father    COPD Father    Heart attack Father    Hypertension Sister    Birth defects Brother    Depression Brother    Hyperlipidemia Brother    Hypertension Brother    Arthritis Daughter    Asthma Daughter    COPD Daughter    Arthritis Son    COPD Son    Hypertension Son    Hyperlipidemia Son    Heart attack Son    Arthritis Maternal Grandmother    Cancer Maternal Grandmother      ROS:  Please see the history of present illness.  All other ROS reviewed and negative.     Physical Exam/Data:   Vitals:   10/01/21 1100 10/01/21 1300 10/01/21 1451 10/01/21 1600  BP: (!) 211/75 (!) 197/90  (!) 187/72  Pulse: 70 75  88  Resp: '13 20  16  '$ Temp:   98.3 F (36.8 C)   TempSrc:   Oral   SpO2: 100% 100%  100%  Weight:      Height:       No intake or output data in the 24 hours ending 10/01/21 1934    09/30/2021    4:59 PM 12/24/2020    1:28 PM  Last 3 Weights  Weight (lbs) 136 lb 0.4 oz 136 lb  Weight (kg) 61.7 kg 61.689 kg     Body mass index is 24.1  kg/m.  General:  Well nourished, well developed, elderly female in no acute distress except for significant coughing HEENT: normal for age Neck: no JVD seen, patient cannot quit coughing long enough for this to be accurately assessed Vascular: No carotid bruits heard; Distal pulses 2+ bilaterally Cardiac:  normal S1, S2; RRR; no murmur  Lungs: Scattered dry rales with wheezy cough, she is not able to cooperate fully with exam because of her coughing no rhonchi Abd: soft, nontender, no hepatomegaly  Ext: no edema Musculoskeletal:  No deformities, BUE and BLE strength normal  and equal Skin: warm and dry  Neuro:  CNs 2-12 intact, no focal abnormalities noted Psych:  Normal affect   EKG:  The EKG was personally reviewed and demonstrates:  SR, HR 88, diffuse ST depression Telemetry:  Telemetry was personally reviewed and demonstrates:  SR, ST  Relevant CV Studies:  ECHO: 06/29/2018  1. The left ventricle has hyperdynamic systolic function, with an  ejection fraction of >65%. The cavity size was decreased. There is mild  concentric left ventricular hypertrophy. Left ventricular diastolic  Doppler parameters are indeterminate. Elevated  left ventricular end-diastolic pressure The E/e' is 18.6.   2. The right ventricle has normal systolic function. The cavity was  normal. There is no increase in right ventricular wall thickness. Right  ventricular systolic pressure could not be assessed.   3. The aortic valve is tricuspid. Mild thickening of the aortic valve.  Mild calcification of the aortic valve. Aortic valve regurgitation is mild  by color flow Doppler.   4. The ascending aorta, aortic arch and aortic root are normal in size  and structure.   5. The interatrial septum appears to be lipomatous.   Laboratory Data:  High Sensitivity Troponin:   Recent Labs  Lab 09/30/21 1811 09/30/21 2318 10/01/21 0736  TROPONINIHS 10 145* 304*     Chemistry Recent Labs  Lab 09/30/21 1811  09/30/21 1933 10/01/21 0736  NA 129* 133* 129*  K 4.2 4.4 4.4  CL 97*  --  98  CO2 25  --  22  GLUCOSE 108*  --  182*  BUN 17  --  18  CREATININE 1.16*  --  1.17*  CALCIUM 10.4*  --  10.2  GFRNONAA 49*  --  49*  ANIONGAP 7  --  9    Recent Labs  Lab 09/30/21 1811  PROT 9.7*  ALBUMIN 4.1  AST 15  ALT 11  ALKPHOS 82  BILITOT 0.4   Lipids  Lab Results  Component Value Date   CHOL 183 09/09/2017   HDL 66 09/09/2017   LDLCALC 85 09/09/2017   TRIG 160 (H) 09/09/2017   CHOLHDL 2.8 09/09/2017     Hematology Recent Labs  Lab 09/30/21 1811 09/30/21 1933 10/01/21 0736  WBC 11.8*  --  13.0*  RBC 3.72*  --  3.85*  HGB 10.7* 11.6* 10.8*  HCT 32.0* 34.0* 32.6*  MCV 86.0  --  84.7  MCH 28.8  --  28.1  MCHC 33.4  --  33.1  RDW 14.4  --  14.2  PLT 215  --  241   Thyroid No results found for: "TSH" Lab Results  Component Value Date   HGBA1C 6.6 09/18/2021     BNP Recent Labs  Lab 09/30/21 1811  BNP 62.3    DDimer No results for input(s): "DDIMER" in the last 168 hours.   Radiology/Studies:  CT Angio Chest PE W and/or Wo Contrast  Result Date: 09/30/2021 CLINICAL DATA:  Concern for pulmonary embolism. EXAM: CT ANGIOGRAPHY CHEST WITH CONTRAST TECHNIQUE: Multidetector CT imaging of the chest was performed using the standard protocol during bolus administration of intravenous contrast. Multiplanar CT image reconstructions and MIPs were obtained to evaluate the vascular anatomy. RADIATION DOSE REDUCTION: This exam was performed according to the departmental dose-optimization program which includes automated exposure control, adjustment of the mA and/or kV according to patient size and/or use of iterative reconstruction technique. CONTRAST:  4m OMNIPAQUE IOHEXOL 350 MG/ML SOLN COMPARISON:  Chest radiograph dated 09/30/2021. FINDINGS: Cardiovascular: There is no  cardiomegaly or pericardial effusion. Mild atherosclerotic calcification of the thoracic aorta. No aneurysmal  dilatation. The origins of the great vessels of the aortic arch appear patent. No pulmonary artery embolus identified. Mediastinum/Nodes: No hilar or mediastinal adenopathy. Top-normal left hilar lymph node measures 8 mm. The esophagus and the thyroid gland are grossly unremarkable. No mediastinal fluid collection. Lungs/Pleura: The lungs are clear. There is no pleural effusion pneumothorax. The central airways are patent. Upper Abdomen: No acute abnormality. Musculoskeletal: No chest wall abnormality. No acute or significant osseous findings. Review of the MIP images confirms the above findings. IMPRESSION: 1. No acute intrathoracic pathology. No CT evidence of pulmonary artery embolus. 2. Aortic Atherosclerosis (ICD10-I70.0). Electronically Signed   By: Anner Crete M.D.   On: 09/30/2021 21:04   DG Chest Port 1 View  Result Date: 09/30/2021 CLINICAL DATA:  Shortness of breath and cough beginning last night. EXAM: PORTABLE CHEST 1 VIEW COMPARISON:  One-view chest x-ray 03/14/2019. FINDINGS: The heart is enlarged. Atherosclerotic calcifications are present at the aorta. A moderate-sized hiatal hernia is noted. Lung volumes are low. Chronic interstitial coarsening is present without superimposed airspace disease. Degenerative changes are noted both shoulders. Cervical spine surgery noted. IMPRESSION: 1. Cardiomegaly without failure. 2. No acute cardiopulmonary disease. 3. Moderate-sized hiatal hernia. Electronically Signed   By: San Morelle M.D.   On: 09/30/2021 17:42     Assessment and Plan:   Elevated troponin  -With no reports of chest pain. -Continue to cycle enzymes to the peak -Continue aspirin, Plavix, Imdur 30 mg, metoprolol 50 mg twice daily, Crestor 40 mg daily and Zetia 10 mg daily.  Continue heparin gtt -Could represent demand ischemia from significantly elevated BP in setting of underlying CAD.  Check echocardiogram.  Likely just medical management given her comorbidities,  including dementia (she is oriented to person only, which is baseline per her son)  2.  URI -She has been started on steroids and continued on her home dose of Spiriva as well as nebulizers. -Per IM  3.  Hypertension: -Her blood pressure is elevated up into the 180s at times despite being on her home medications of amlodipine 5 mg daily, losartan 50 mg daily, spironolactone 25 mg daily and metoprolol 50 mg twice daily -Increase amlodipine to 10 mg daily - Because of the steroids given her for her URI, the pressure may be harder to control    Risk Assessment/Risk Scores:     TIMI Risk Score for Unstable Angina or Non-ST Elevation MI:   The patient's TIMI risk score is 5, which indicates a 26% risk of all cause mortality, new or recurrent myocardial infarction or need for urgent revascularization in the next 14 days.   For questions or updates, please contact [place] Please consult [email or web address] for contact info under    Signed, Rosaria Ferries, PA-C  10/01/2021 7:34 PM   Patient seen and examined.  Agree with above documentation.  Ms. Bognar is a 76 year old female with a history of dementia, CAD, CVA, PAD, carotid artery disease, T2DM, hypertension, hyperlipidemia, PVD, COPD, tobacco use, OSA we are consulted by Dr. Karle Starch for evaluation of troponin elevation.  She previously followed with cardiology in [facility].  Last saw Richardson Dopp 12/24/2020.  She has a history of multivessel CAD for which medical management was recommended.  She initially presented to ED at [facility] with cough.  Denies any chest pain.  CTA was done which showed no evidence of PE.  COVID-19 negative.  Vital signs  notable for significantly elevated BP, up to 212/88.  Labs notable for creatinine 1.16, sodium 129, WBC 11.8, hemoglobin 10.7, platelets 215, BNP 62, troponin 10 > 145 > 304.  EKG shows sinus rhythm, rate 88, less than 1 mm ST depressions in leads I, II, III, aVF, V4-6.  She was  started on heparin drip and transferred to [facility].  On exam, patient is oriented to person only, tachycardic, regular, no murmurs, rhonchi on lung exam, no LE edema or JVD.  For her troponin elevation, she has known multivessel CAD for which medical management has been recommended.  Would trend troponins to peak.  Could represent demand ischemia in setting of significantly elevated blood pressures, but cannot rule out type I MI.  Reasonable to continue heparin drip for now.  Will check echocardiogram.  Plan medical management given her comorbidities, including dementia (she is oriented to person only, which per son is her baseline).  Donato Heinz, MD

## 2021-10-01 NOTE — H&P (Addendum)
History and Physical  [name] IHK:[id number] DOB: Oct 01, 1945 DOA: 09/30/2021  Referring physician: Dr. Karle Starch, [place]  PCP: [place], Betty G, MD  Outpatient Specialists: GI, neurology, palliative care medicine. Patient coming from: Assisted living facility. Chief Complaint: Persistent cough and shortness of breath  HPI: Kathryn Lewis is a 76 y.o. female with medical history significant for dementia, type 2 diabetes, hyperlipidemia, hypertension, CKD 3B, coronary artery disease, peripheral artery disease, prior CVA, seizure disorder, OSA, COPD, former tobacco user quit in 2015, who initially presented to [facility] ED with complaints of persistent nonproductive cough.  She is unable to tell me how long she has been coughing.  She denies any chest pain at the time of this visit.  She is unable to tell me whether she has had chest pain prior to presentation.    Upon arrival to the ED, she was noted to have a nonproductive persistent cough.  Her CTA chest was negative for pulmonary embolism and unequivocal for pneumonia.  COVID-19 screening test negative.  As part of her work-up she had a troponin checked which was initially negative and repeat up trended, peaked at 304.  EDP discussed the case with cardiology and was started on heparin drip.  The patient was transferred to [facility] for further evaluation and management.  Due to concern for early atypical pneumonia she was started on Rocephin and azithromycin as well as DuoNebs every 6 hours.  ED Course: BP 206/64, pulse 110, respiration rate 15, saturation 98% on 2 L.  WBC 13.0, hemoglobin 10.8, platelet 241.  Serum sodium 129, glucose 182, creatinine 1.17 and GFR 49.  Review of Systems: Review of systems as noted in the HPI. All other systems reviewed and are negative.   Past Medical History:  Diagnosis Date   Allergy    Arthritis    Bipolar 1 disorder ([place])    Blood transfusion without reported diagnosis    Carotid artery  stenosis    50-69% bilateral ICA stenoses by velocity criteria but no visible plaque and ICA/CCA ratio 1.72 on right and 1.38 on left   Coronary artery disease    Depression    Diabetes mellitus without complication (HCC)    Echocardiogram    Echo 06/2018: Hyperdynamic systolic function, EF >75, mild concentric LVH, elevated LVEDP (E/E' suggests impaired relaxation), mild AI, lipomatous interatrial septum   Hypertension    Left adrenal mass (HCC)    2.5 cm L adrenal mass on CT per note of Dr. Seleta Rhymes 05/20/2014   MI, old    PAD (peripheral artery disease) ([place])    03/2014 - CT aortogram with lower extremity runoff (mild to moderate disease in common iliac arteries, complete occulusion of her bilateral external iliac arteries with heavily disease common femoral arteries. She also has disease to her SFAs bilaterally. Popliteal arteries and tibial vessel runoff appears to be adequate)   Seizures ([place])    Stroke [facility])    Past Surgical History:  Procedure Laterality Date   BREAST SURGERY  2012   small lump nonmilignant   NECK SURGERY      Social History:  reports that she quit smoking about 8 years ago. Her smoking use included cigarettes. She smoked an average of 1 pack per day. She has never used smokeless tobacco. She reports that she does not drink alcohol and does not use drugs.   Allergies  Allergen Reactions   Amoxicillin Other (See Comments)    colitis   Augmentin [Amoxicillin-Pot Clavulanate] Other (See  Comments)    colitis   Cephalexin Other (See Comments)    colitis    Family History  Problem Relation Age of Onset   Hypertension Mother    Arthritis Mother    Diabetes Mother    Early death Mother    Hyperlipidemia Mother    Stroke Mother    Hypertension Father    Alcohol abuse Father    Early death Father    Hyperlipidemia Father    COPD Father    Heart attack Father    Hypertension Sister    Birth defects Brother    Depression Brother    Hyperlipidemia Brother     Hypertension Brother    Arthritis Daughter    Asthma Daughter    COPD Daughter    Arthritis Son    COPD Son    Hypertension Son    Hyperlipidemia Son    Heart attack Son    Arthritis Maternal Grandmother    Cancer Maternal Grandmother       Prior to Admission medications   Medication Sig Start Date End Date Taking? Authorizing Provider  amLODipine (NORVASC) 5 MG tablet Take 1 tablet (5 mg total) by mouth daily. 09/29/21  Yes Martinique, Betty G, MD  ASPIRIN LOW DOSE 81 MG tablet TAKE ONE TABLET BY MOUTH EVERY DAY 07/27/21  Yes Martinique, Betty G, MD  Cholecalciferol (GNP VITAMIN D MAXIMUM STRENGTH) 50 MCG (2000 UT) TABS TAKE ONE TABLET BY MOUTH EVERY DAY 08/28/21  Yes [place], Betty G, MD  doxycycline (VIBRAMYCIN) 100 MG capsule Take 1 capsule (100 mg total) by mouth 2 (two) times daily for 5 days. 09/30/21 10/05/21 Yes Regan Lemming, MD  ezetimibe (ZETIA) 10 MG tablet TAKE ONE TABLET BY MOUTH EVERY DAY 09/25/21  Yes [place], Betty G, MD  HYDROcodone-acetaminophen (NORCO/VICODIN) 5-325 MG tablet Take 1 tablet by mouth every 6 (six) hours as needed for moderate pain.   Yes [provider]  Insulin Aspart FlexPen 100 UNIT/ML SOPN INJECT subcutaneously THREE TIMES DAILY PER sliding scales BLOOD SUGAR (150 NO INSULIN)+ 151-180 Give FOUR units. 181-220 Give SIX units, 221-260 Give EIGHT units, 261-300 Give 10 units 301-340 Give 12 units AND > 341 Give 14 units 11/26/19  Yes Martinique, Betty G, MD  isosorbide mononitrate (IMDUR) 30 MG 24 hr tablet TAKE ONE TABLET BY MOUTH EVERY DAY 09/29/21  Yes [place], Betty G, MD  lamoTRIgine (LAMICTAL) 200 MG tablet TAKE 1.5 TABLETS BY MOUTH TWICE DAILY 10/21/20  Yes Cameron Sprang, MD  LANTUS SOLOSTAR 100 UNIT/ML Solostar Pen INJECT 20 units AT BEDTIME 09/03/20  Yes Martinique, Betty G, MD  losartan (COZAAR) 50 MG tablet TAKE ONE TABLET BY MOUTH EVERY DAY 09/25/21  Yes Martinique, Betty G, MD  metoprolol tartrate (LOPRESSOR) 50 MG tablet TAKE ONE TABLET BY MOUTH TWICE DAILY 05/29/21   Yes [place], Betty G, MD  polyvinyl alcohol (ARTIFICIAL TEARS) 1.4 % ophthalmic solution Place 1 drop into both eyes in the morning and at bedtime. 04/22/21  Yes [place], Betty G, MD  predniSONE (DELTASONE) 10 MG tablet Take 4 tablets (40 mg total) by mouth daily for 5 days. 09/30/21 10/05/21 Yes Regan Lemming, MD  rosuvastatin (CRESTOR) 40 MG tablet TAKE ONE TABLET BY MOUTH EVERY DAY 08/04/21  Yes Martinique, Betty G, MD  [facility] HANDIHALER 18 MCG inhalation capsule place ONE capsule into inhaler AND inhale EVERY DAY 07/07/21  Yes [place], Betty G, MD  spironolactone (ALDACTONE) 50 MG tablet TAKE ONE TABLET BY MOUTH EVERY DAY 09/25/21  Yes  [place], Betty G, MD  albuterol (VENTOLIN HFA) 108 (90 Base) MCG/ACT inhaler Inhale 2 puffs into the lungs every 6 (six) hours as needed for wheezing or shortness of breath. 11/28/20   [place], Betty G, MD  clopidogrel (PLAVIX) 75 MG tablet TAKE ONE TABLET BY MOUTH EVERY DAY 03/18/21   Martin[place], Betty G, MD  FEROSUL 325 (65 Fe) MG tablet TAKE ONE TABLET BY MOUTH EVERY TWO DAYS with FOUR OUNCE of orange JUICE 02/23/21   Martin[place], Betty G, MD  fluticasone [facility]) 50 MCG/ACT nasal spray Place 2 sprays into both nostrils daily. 04/22/17   Nafziger, Tommi Rumps, NP  furosemide (LASIX) 20 MG tablet Take 1 tablet (20 mg total) by mouth daily as needed for edema. 11/28/20   Martinique, Betty G, MD  GNP PAIN RELIEF EX-STRENGTH 500 MG tablet TAKE ONE TABLET BY MOUTH FOUR TIMES DAILY 08/19/21   Martinique, Betty G, MD  guaiFENesin (MUCINEX) 600 MG 12 hr tablet Take 1 tablet (600 mg total) by mouth daily as needed for to loosen phlegm. 12/12/20   Martin[place], Betty G, MD  Omega-3 Fatty Acids ([place]) 1000 MG CAPS TAKE TWO CAPSULES BY MOUTH EVERY DAY 09/26/20   Martin[place], Betty G, MD  phenytoin (DILANTIN) 100 MG ER capsule Take 3 capsules (300 mg total) by mouth daily. 10/21/20   Cameron Sprang, MD  phenytoin (DILANTIN) 30 MG ER capsule Take 1 capsule (30 mg total) by mouth at bedtime. 09/10/21   Pieter Partridge, DO   Spacer/Aero Chamber Mouthpiece MISC 1 Device by Does not apply route daily as needed. 12/24/16   Rigoberto Noel, MD  spironolactone (ALDACTONE) 25 MG tablet Take 1 tablet (25 mg total) by mouth daily. 09/22/21   Martin[place], Betty G, MD  tiZANidine (ZANAFLEX) 4 MG tablet TAKE 1/2 TABLET BY MOUTH EVERY 12 HOURS AS NEEDED muscle SPASMS 05/04/21   [place], Betty G, MD    Physical Exam: BP (!) 187/72   Pulse 88   Temp 98.3 F (36.8 C) (Oral)   Resp 16   Ht '5\' 3"'$  (1.6 m)   Wt 61.7 kg   LMP 03/17/2015   SpO2 100%   BMI 24.10 kg/m   General: 76 y.o. year-old female well developed well nourished in no acute distress.  Alert and oriented x1. Cardiovascular: Regular rate and rhythm with no rubs or gallops.  No thyromegaly or JVD noted.  No lower extremity edema. 2/4 pulses in all 4 extremities. Respiratory: Rales noted at bases with mild wheezing at lung fields. Good inspiratory effort. Abdomen: Soft nontender nondistended with normal bowel sounds x4 quadrants. Muskuloskeletal: No cyanosis, clubbing or edema noted bilaterally Neuro: CN II-XII intact, strength, sensation, reflexes Skin: No ulcerative lesions noted or rashes Psychiatry: Judgement and insight appear altered in the setting of dementia. Mood is appropriate for condition and setting          Labs on Admission:  Basic Metabolic Panel: Recent Labs  Lab 09/30/21 1811 09/30/21 1933 10/01/21 0736  NA 129* 133* 129*  K 4.2 4.4 4.4  CL 97*  --  98  CO2 25  --  22  GLUCOSE 108*  --  182*  BUN 17  --  18  CREATININE 1.16*  --  1.17*  CALCIUM 10.4*  --  10.2   Liver Function Tests: Recent Labs  Lab 09/30/21 1811  AST 15  ALT 11  ALKPHOS 82  BILITOT 0.4  PROT 9.7*  ALBUMIN 4.1   No results for input(s): "LIPASE", "AMYLASE"  in the last 168 hours. No results for input(s): "AMMONIA" in the last 168 hours. CBC: Recent Labs  Lab 09/30/21 1811 09/30/21 1933 10/01/21 0736  WBC 11.8*  --  13.0*  HGB 10.7* 11.6* 10.8*  HCT  32.0* 34.0* 32.6*  MCV 86.0  --  84.7  PLT 215  --  241   Cardiac Enzymes: No results for input(s): "CKTOTAL", "CKMB", "CKMBINDEX", "TROPONINI" in the last 168 hours.  BNP (last 3 results) Recent Labs    09/30/21 1811  BNP 62.3    ProBNP (last 3 results) No results for input(s): "PROBNP" in the last 8760 hours.  CBG: Recent Labs  Lab 10/01/21 1248 10/01/21 1849  GLUCAP 92 208*    Radiological Exams on Admission: CT Angio Chest PE W and/or Wo Contrast  Result Date: 09/30/2021 CLINICAL DATA:  Concern for pulmonary embolism. EXAM: CT ANGIOGRAPHY CHEST WITH CONTRAST TECHNIQUE: Multidetector CT imaging of the chest was performed using the standard protocol during bolus administration of intravenous contrast. Multiplanar CT image reconstructions and MIPs were obtained to evaluate the vascular anatomy. RADIATION DOSE REDUCTION: This exam was performed according to the departmental dose-optimization program which includes automated exposure control, adjustment of the mA and/or kV according to patient size and/or use of iterative reconstruction technique. CONTRAST:  13m OMNIPAQUE IOHEXOL 350 MG/ML SOLN COMPARISON:  Chest radiograph dated 09/30/2021. FINDINGS: Cardiovascular: There is no cardiomegaly or pericardial effusion. Mild atherosclerotic calcification of the thoracic aorta. No aneurysmal dilatation. The origins of the great vessels of the aortic arch appear patent. No pulmonary artery embolus identified. Mediastinum/Nodes: No hilar or mediastinal adenopathy. Top-normal left hilar lymph node measures 8 mm. The esophagus and the thyroid gland are grossly unremarkable. No mediastinal fluid collection. Lungs/Pleura: The lungs are clear. There is no pleural effusion pneumothorax. The central airways are patent. Upper Abdomen: No acute abnormality. Musculoskeletal: No chest wall abnormality. No acute or significant osseous findings. Review of the MIP images confirms the above findings.  IMPRESSION: 1. No acute intrathoracic pathology. No CT evidence of pulmonary artery embolus. 2. Aortic Atherosclerosis (ICD10-I70.0). Electronically Signed   By: AAnner CreteM.D.   On: 09/30/2021 21:04   DG Chest Port 1 View  Result Date: 09/30/2021 CLINICAL DATA:  Shortness of breath and cough beginning last night. EXAM: PORTABLE CHEST 1 VIEW COMPARISON:  One-view chest x-ray 03/14/2019. FINDINGS: The heart is enlarged. Atherosclerotic calcifications are present at the aorta. A moderate-sized hiatal hernia is noted. Lung volumes are low. Chronic interstitial coarsening is present without superimposed airspace disease. Degenerative changes are noted both shoulders. Cervical spine surgery noted. IMPRESSION: 1. Cardiomegaly without failure. 2. No acute cardiopulmonary disease. 3. Moderate-sized hiatal hernia. Electronically Signed   By: CSan MorelleM.D.   On: 09/30/2021 17:42    EKG: I independently viewed the EKG done and my findings are as followed: Sinus rhythm rate of 88.  Nonspecific ST-T changes.  QTc 412.  Assessment/Plan Present on Admission:  Elevated troponin  Principal Problem:   Elevated troponin  Elevated troponin, suspect demand ischemia in the setting of presumptive atypical pneumonia, need to rule out ACS. High-sensitivity troponin peaked at 304. No evidence of acute ischemia on twelve-lead EKG. Started on heparin drip in the ED Seen by cardiology, appreciate recommendations. Follow 2D echo, fasting lipid panel. Monitor on telemetry  Suspected early atypical pneumonia/COPD exacerbation, POA CT scan unequivocal Persistent nonproductive cough, unclear chronicity Started Rocephin and azithromycin x3 days DuoNeb every 6 hours 1 dose of Solu-Medrol 40 mg x 1. Obtain  baseline procalcitonin in the morning  Essential pretension, uncontrolled BP not at goal, elevated Resume home oral antihypertensives Monitor vital signs  Type 2 diabetes with  hyperglycemia Obtain hemoglobin A1c Start insulin sliding scale.  Coronary artery disease/PAD Resume home regimen. Continue aspirin, Plavix, Zetia  Hyperlipidemia Resume home regimen.  Chronic diastolic CHF Resume home regimen. Start strict I's and O's and daily weight  Seizure disorder Resume home regimen. Seizure precautions IV Ativan as needed for breakthrough seizures.  Dementia Reorient as needed Livial assisted living facility [facility] consulted to assist with DC planning.  Chronic hyponatremia/CKD 3B On Lasix and losartan which may contribute to chronic hyponatremia. Renal function and hyponatremia at baseline Monitor electrolytes and renal function     DVT prophylaxis: Heparin drip  Code Status: Full code, unable to discuss CODE STATUS due to confusion in the setting of dementia.  Please reassess CODE STATUS.  Family Communication: None at bedside  Disposition Plan: Admitted to telemetry cardiac unit  Consults called: Cardiology  Admission status: Inpatient status   Status is: Inpatient The patient requires at least 2 midnights for further evaluation and treatment of present condition.   Kayleen Memos MD [facility] Pager [phone]  If 7PM-7AM, please contact night-coverage [email or web address] Password [facility]  10/01/2021, 7:32 PM

## 2021-10-01 NOTE — ED Notes (Signed)
Patient complains of congestion in ears. Patient repositioned in bed

## 2021-10-01 NOTE — Progress Notes (Signed)
Dr. Cyd Silence notified about pt elevated BP. Orders placed on pt if needed after pt receives scheduled metoprolol.

## 2021-10-01 NOTE — ED Notes (Signed)
Patient provided with tissue and emesis bag because she has to blow her nose and also needs to spit out phlegm due to excess coughing.

## 2021-10-01 NOTE — Significant Event (Signed)
I was paged about patient with rising troponin. In short this is a 76 year old female with multiple medical problems including hypertension, hyperlipidemia, type 2 diabetes, peripheral arterial disease, peripheral neuropathy, seizure disorder and dementia.  Initially patient presented to the hospital with cough and shortness of breath.  Initial concern for URI. No apparent complaints for chest pain given dementia.  EKG unremarkable.  Hemodynamically stable.  Initial troponin 10 follow-up 135. Evidence of multiple electrolyte derangements probably in the setting of prerenal AKI.  Hemoglobin 10.7 with a baseline around 9 probably the setting of hemoconcentration. Overall I feel patient will benefit from medical management at this point in time.   Plan. Follow-up troponin Serial EKGs. Follow-up TTE. Continue patient's on a heparin drip. Load patient with Plavix 300 mg followed by 75 mg p.o. daily. Low-dose aspirin 81 mg p.o. daily. Start patient on high-dose statin. Uptitrate GDMT as tolerated.

## 2021-10-01 NOTE — Progress Notes (Signed)
ANTICOAGULATION CONSULT NOTE - Initial Consult  Pharmacy Consult for heparin Indication: chest pain/ACS  Allergies  Allergen Reactions   Amoxicillin Other (See Comments)    colitis   Augmentin [Amoxicillin-Pot Clavulanate] Other (See Comments)    colitis   Cephalexin Other (See Comments)    colitis    Patient Measurements: Height: '5\' 3"'$  (160 cm) Weight: 61.7 kg (136 lb 0.4 oz) IBW/kg (Calculated) : 52.4 Heparin Dosing Weight: 62 kg  Vital Signs: Temp: 98.3 F (36.8 C) (09/07 1451) Temp Source: Oral (09/07 1451) BP: 187/72 (09/07 1600) Pulse Rate: 88 (09/07 1600)  Labs: Recent Labs    09/30/21 1811 09/30/21 1933 09/30/21 2318 10/01/21 0736 10/01/21 1845  HGB 10.7* 11.6*  --  10.8*  --   HCT 32.0* 34.0*  --  32.6*  --   PLT 215  --   --  241  --   HEPARINUNFRC  --   --   --   --  0.15*  CREATININE 1.16*  --   --  1.17*  --   TROPONINIHS 10  --  145* 304*  --      Estimated Creatinine Clearance: 34.4 mL/min (A) (by C-G formula based on SCr of 1.17 mg/dL (H)).   Medical History: Past Medical History:  Diagnosis Date   Allergy    Arthritis    Bipolar 1 disorder ([place])    Blood transfusion without reported diagnosis    Carotid artery stenosis    50-69% bilateral ICA stenoses by velocity criteria but no visible plaque and ICA/CCA ratio 1.72 on right and 1.38 on left   Coronary artery disease    Depression    Diabetes mellitus without complication (HCC)    Echocardiogram    Echo 06/2018: Hyperdynamic systolic function, EF >02, mild concentric LVH, elevated LVEDP (E/E' suggests impaired relaxation), mild AI, lipomatous interatrial septum   Hypertension    Left adrenal mass (HCC)    2.5 cm L adrenal mass on CT per note of Dr. Seleta Rhymes 05/20/2014   MI, old    PAD (peripheral artery disease) ([place])    03/2014 - CT aortogram with lower extremity runoff (mild to moderate disease in common iliac arteries, complete occulusion of her bilateral external iliac arteries with  heavily disease common femoral arteries. She also has disease to her SFAs bilaterally. Popliteal arteries and tibial vessel runoff appears to be adequate)   Seizures (HCC)    Stroke (HCC)     Medications:  Awaiting med rec  Assessment: 76 y.o. F presents with SOB/cough. Trop up to 145. To begin heparin for ACS. CBC ok on admission. Cards plan for medical management at this time. No AC PTA.  HL 0.15 which is subtherapeutic.  Goal of Therapy:  Heparin level 0.3-0.7 units/ml Monitor platelets by anticoagulation protocol: Yes   Plan:  Incr Heparin gtt at 1000 units/hr Will f/u heparin level in 8 hours Daily heparin level and CBC  Alanda Slim, PharmD, [facility] Clinical Pharmacist Please see AMION for all Pharmacists' Contact Phone Numbers 10/01/2021, 7:38 PM

## 2021-10-01 NOTE — ED Notes (Signed)
Dr. Karle Starch aware of troponin of 145.

## 2021-10-01 NOTE — ED Notes (Signed)
First contact with Patient. Patient calm/cooperative. Patient repositioned at this time.   1112: heparin continues to infuse. Report given to [facility] for continuation of care. No acute distress noted upon this Rns departure to Patient.

## 2021-10-01 NOTE — ED Notes (Signed)
RT Note: Pt. seen yesterday 09/30/2021 before end of shift and was not able to complete Peak Flow Spirometry.

## 2021-10-01 NOTE — ED Provider Notes (Addendum)
Care of the patient assumed at the change of shift. Here for SOB, was agitated initially so given some Ativan to facilitate her workup which was unremarkable at the time of shift change. She was pending a repeat Trop and then plan to discharge back to her ALF when more awake. Her second troponin has come back elevated to 145. Her daughter, who is her POA, was at bedside previously but had left to go home per RN. I attempted to contact her by phone but did not get an answer. VM was left. Will discuss admission with the Hospitalist.   1:03 AM Spoke with Dr. Aileen Fass, hospitalist, who will accept for admission.    1:26 AM Spoke with Dr. Sheppard Coil, Cardiology, who will arrange for consult.    Truddie Hidden, MD 10/01/21 [phone]

## 2021-10-01 NOTE — ED Provider Notes (Signed)
Patient is boarding in this ED awaiting bed.  Per hospitalist note 0500 had recommended heparin ggt, which is ordered now. Will repeat labs.   Wyvonnia Dusky, MD 10/01/21 [phone]

## 2021-10-01 NOTE — Progress Notes (Signed)
Dr. Marcelle Smiling notified about pt Troponin 328. Pt is w/o complaints of pain at this time. VS stable elevated BP is being treated.

## 2021-10-01 NOTE — ED Notes (Signed)
Blood work collected and sent to lab as ordered. Heparin running as ordered. Patient states she is not in pain but is uncomfortable due to the constant coughing. She stated that she has been unable to sleep due to the constant coughing as well. Patient requested for coke which she was provided and call bell was also handed to her.

## 2021-10-01 NOTE — Telephone Encounter (Signed)
Pt has been hospitalized. Kathryn Erbes [place], MD

## 2021-10-01 NOTE — Progress Notes (Signed)
D/w Dr. [place] Crane, we will change Levaquin to ceftriaxone instead since keflex issue is more of an intolerance.   Onnie Boer, PharmD, BCIDP, AAHIVP, CPP Infectious Disease Pharmacist 10/01/2021 8:14 PM

## 2021-10-01 NOTE — Progress Notes (Signed)
ANTICOAGULATION CONSULT NOTE - Initial Consult  Pharmacy Consult for heparin Indication: chest pain/ACS  Allergies  Allergen Reactions   Influenza Vaccines Anaphylaxis   Amoxicillin Other (See Comments)    colitis   Augmentin [Amoxicillin-Pot Clavulanate] Other (See Comments)    colitis   Cefadroxil Other (See Comments)    Reported by [facility] 2013 - unknown reaction   Cephalexin Other (See Comments)    colitis   Erythromycin Other (See Comments)    colitis   Niacin And Related Other (See Comments)    colitis   Nitrofurantoin Macrocrystal Other (See Comments)    Reported by [facility] 2013 - unknown reaction    Patient Measurements: Height: '5\' 3"'$  (160 cm) Weight: 61.7 kg (136 lb 0.4 oz) IBW/kg (Calculated) : 52.4 Heparin Dosing Weight: 62 kg  Vital Signs: Temp: 98.1 F (36.7 C) (09/07 0600) BP: 169/75 (09/07 0600) Pulse Rate: 79 (09/07 0600)  Labs: Recent Labs    09/30/21 1811 09/30/21 1933 09/30/21 2318  HGB 10.7* 11.6*  --   HCT 32.0* 34.0*  --   PLT 215  --   --   CREATININE 1.16*  --   --   TROPONINIHS 10  --  145*    Estimated Creatinine Clearance: 34.7 mL/min (A) (by C-G formula based on SCr of 1.16 mg/dL (H)).   Medical History: Past Medical History:  Diagnosis Date   Allergy    Arthritis    Bipolar 1 disorder ([place])    Blood transfusion without reported diagnosis    Carotid artery stenosis    50-69% bilateral ICA stenoses by velocity criteria but no visible plaque and ICA/CCA ratio 1.72 on right and 1.38 on left   Coronary artery disease    Depression    Diabetes mellitus without complication (HCC)    Echocardiogram    Echo 06/2018: Hyperdynamic systolic function, EF >52, mild concentric LVH, elevated LVEDP (E/E' suggests impaired relaxation), mild AI, lipomatous interatrial septum   Hypertension    Left adrenal mass (HCC)    2.5 cm L adrenal mass on CT per note of Dr. Seleta Rhymes 05/20/2014   MI, old    PAD (peripheral artery disease)  ([place])    03/2014 - CT aortogram with lower extremity runoff (mild to moderate disease in common iliac arteries, complete occulusion of her bilateral external iliac arteries with heavily disease common femoral arteries. She also has disease to her SFAs bilaterally. Popliteal arteries and tibial vessel runoff appears to be adequate)   Seizures (HCC)    Stroke (HCC)     Medications:  Awaiting med rec  Assessment: 76 y.o. F presents with SOB/cough. Trop up to 145. To begin heparin for ACS. CBC ok on admission. Cards plan for medical management at this time. No AC PTA.  Goal of Therapy:  Heparin level 0.3-0.7 units/ml Monitor platelets by anticoagulation protocol: Yes   Plan:  Heparin IV bolus 3500 units Heparin gtt at 850 units/hr Will f/u heparin level in 8 hours Daily heparin level and CBC  Sherlon Handing, PharmD, BCPS Please see amion for complete clinical pharmacist phone list 10/01/2021,7:28 AM

## 2021-10-02 ENCOUNTER — Encounter (HOSPITAL_COMMUNITY): Payer: Self-pay | Admitting: Internal Medicine

## 2021-10-02 ENCOUNTER — Inpatient Hospital Stay (HOSPITAL_COMMUNITY): Payer: HMO

## 2021-10-02 DIAGNOSIS — R7989 Other specified abnormal findings of blood chemistry: Secondary | ICD-10-CM | POA: Diagnosis not present

## 2021-10-02 DIAGNOSIS — I2510 Atherosclerotic heart disease of native coronary artery without angina pectoris: Secondary | ICD-10-CM | POA: Diagnosis not present

## 2021-10-02 DIAGNOSIS — I10 Essential (primary) hypertension: Secondary | ICD-10-CM

## 2021-10-02 DIAGNOSIS — R778 Other specified abnormalities of plasma proteins: Secondary | ICD-10-CM | POA: Diagnosis not present

## 2021-10-02 LAB — BASIC METABOLIC PANEL
Anion gap: 12 (ref 5–15)
BUN: 18 mg/dL (ref 8–23)
CO2: 19 mmol/L — ABNORMAL LOW (ref 22–32)
Calcium: 9.1 mg/dL (ref 8.9–10.3)
Chloride: 99 mmol/L (ref 98–111)
Creatinine, Ser: 0.97 mg/dL (ref 0.44–1.00)
GFR, Estimated: >60 mL/min (ref >60)
Glucose, Bld: 206 mg/dL — ABNORMAL HIGH (ref 70–99)
Potassium: 4.3 mmol/L (ref 3.5–5.1)
Sodium: 130 mmol/L — ABNORMAL LOW (ref 135–145)

## 2021-10-02 LAB — PHOSPHORUS: Phosphorus: 3.1 mg/dL (ref 2.5–4.6)

## 2021-10-02 LAB — ECHOCARDIOGRAM COMPLETE
AR max vel: 1.63 cm2
AV Area VTI: 1.45 cm2
AV Area mean vel: 1.6 cm2
AV Mean grad: 6 mmHg
AV Peak grad: 11.6 mmHg
Ao pk vel: 1.7 m/s
Area-P 1/2: 2.56 cm2
Height: 63 in
MV VTI: 2.47 cm2
S' Lateral: 2.2 cm
Weight: 2176.38 oz

## 2021-10-02 LAB — FERRITIN: Ferritin: 203 ng/mL (ref 11–307)

## 2021-10-02 LAB — IRON AND TIBC
Iron: 49 ug/dL (ref 28–170)
Saturation Ratios: 22 % (ref 10.4–31.8)
TIBC: 220 ug/dL — ABNORMAL LOW (ref 250–450)
UIBC: 171 ug/dL

## 2021-10-02 LAB — HEMOGLOBIN A1C
Hgb A1c MFr Bld: 6.8 % — ABNORMAL HIGH (ref 4.8–5.6)
Mean Plasma Glucose: 148.46 mg/dL

## 2021-10-02 LAB — LIPID PANEL
Cholesterol: 123 mg/dL (ref 0–200)
HDL: 49 mg/dL (ref >40)
LDL Cholesterol: 61 mg/dL (ref 0–99)
Total CHOL/HDL Ratio: 2.5 RATIO
Triglycerides: 66 mg/dL (ref <150)
VLDL: 13 mg/dL (ref 0–40)

## 2021-10-02 LAB — TROPONIN I (HIGH SENSITIVITY): Troponin I (High Sensitivity): 351 ng/L (ref <18)

## 2021-10-02 LAB — PROCALCITONIN: Procalcitonin: 0.15 ng/mL

## 2021-10-02 LAB — GLUCOSE, CAPILLARY
Glucose-Capillary: 210 mg/dL — ABNORMAL HIGH (ref 70–99)
Glucose-Capillary: 236 mg/dL — ABNORMAL HIGH (ref 70–99)
Glucose-Capillary: 185 mg/dL — ABNORMAL HIGH (ref 70–99)
Glucose-Capillary: 149 mg/dL — ABNORMAL HIGH (ref 70–99)
Glucose-Capillary: 112 mg/dL — ABNORMAL HIGH (ref 70–99)
Glucose-Capillary: 187 mg/dL — ABNORMAL HIGH (ref 70–99)
Glucose-Capillary: 241 mg/dL — ABNORMAL HIGH (ref 70–99)

## 2021-10-02 LAB — RETICULOCYTES
Immature Retic Fract: 16.7 % — ABNORMAL HIGH (ref 2.3–15.9)
RBC.: 3.79 MIL/uL — ABNORMAL LOW (ref 3.87–5.11)
Retic Count, Absolute: 69.7 10*3/uL (ref 19.0–186.0)
Retic Ct Pct: 1.8 % (ref 0.4–3.1)

## 2021-10-02 LAB — HEPARIN LEVEL (UNFRACTIONATED)
Heparin Unfractionated: 0.41 IU/mL (ref 0.30–0.70)
Heparin Unfractionated: 0.32 IU/mL (ref 0.30–0.70)

## 2021-10-02 LAB — MAGNESIUM: Magnesium: 2 mg/dL (ref 1.7–2.4)

## 2021-10-02 LAB — VITAMIN B12: Vitamin B-12: 441 pg/mL (ref 180–914)

## 2021-10-02 LAB — FOLATE: Folate: 7.5 ng/mL (ref >5.9)

## 2021-10-02 MED ORDER — PREDNISONE 20 MG PO TABS
40.0000 mg | ORAL_TABLET | Freq: Every day | ORAL | Status: DC
Start: 2021-10-02 — End: 2021-10-04
  Administered 2021-10-02 – 2021-10-04 (×3): 40 mg via ORAL
  Filled 2021-10-02 (×3): qty 2

## 2021-10-02 MED ORDER — UMECLIDINIUM BROMIDE 62.5 MCG/ACT IN AEPB
1.0000 | INHALATION_SPRAY | Freq: Every day | RESPIRATORY_TRACT | Status: DC
  Administered 2021-10-03 – 2021-10-09 (×7): 1 via RESPIRATORY_TRACT
  Filled 2021-10-02: qty 7

## 2021-10-02 MED ORDER — BUDESONIDE 0.25 MG/2ML IN SUSP
0.2500 mg | Freq: Two times a day (BID) | RESPIRATORY_TRACT | Status: DC
  Administered 2021-10-02 – 2021-10-09 (×14): 0.25 mg via RESPIRATORY_TRACT
  Filled 2021-10-02 (×14): qty 2

## 2021-10-02 MED ORDER — SODIUM CHLORIDE 3 % IN NEBU
4.0000 mL | INHALATION_SOLUTION | Freq: Every day | RESPIRATORY_TRACT | Status: DC
Start: 2021-10-02 — End: 2021-10-04
  Administered 2021-10-03: 4 mL via RESPIRATORY_TRACT
  Filled 2021-10-02 (×3): qty 4

## 2021-10-02 MED ORDER — LAMOTRIGINE 100 MG PO TABS
300.0000 mg | ORAL_TABLET | Freq: Two times a day (BID) | ORAL | Status: DC
  Administered 2021-10-02 – 2021-10-09 (×13): 300 mg via ORAL
  Filled 2021-10-02 (×13): qty 3

## 2021-10-02 MED ORDER — INSULIN GLARGINE-YFGN 100 UNIT/ML ~~LOC~~ SOLN
15.0000 [IU] | Freq: Every day | SUBCUTANEOUS | Status: DC
  Administered 2021-10-02 – 2021-10-08 (×7): 15 [IU] via SUBCUTANEOUS
  Filled 2021-10-02 (×8): qty 0.15

## 2021-10-02 MED ORDER — ARFORMOTEROL TARTRATE 15 MCG/2ML IN NEBU
15.0000 ug | INHALATION_SOLUTION | Freq: Two times a day (BID) | RESPIRATORY_TRACT | Status: DC
  Administered 2021-10-02 – 2021-10-09 (×14): 15 ug via RESPIRATORY_TRACT
  Filled 2021-10-02 (×14): qty 2

## 2021-10-02 MED ORDER — GUAIFENESIN ER 600 MG PO TB12
1200.0000 mg | ORAL_TABLET | Freq: Two times a day (BID) | ORAL | Status: DC
Start: 2021-10-02 — End: 2021-10-09
  Administered 2021-10-02 – 2021-10-09 (×15): 1200 mg via ORAL
  Filled 2021-10-02 (×15): qty 2

## 2021-10-02 MED ORDER — BENZONATATE 100 MG PO CAPS
100.0000 mg | ORAL_CAPSULE | Freq: Three times a day (TID) | ORAL | Status: DC | PRN
  Administered 2021-10-02 – 2021-10-07 (×9): 100 mg via ORAL
  Filled 2021-10-02 (×9): qty 1

## 2021-10-02 NOTE — Progress Notes (Addendum)
PROGRESS NOTE    Kathryn Lewis  GLO:[id number] DOB: 1945/10/06 DOA: 09/30/2021 PCP: [place], Betty G, MD   Brief Narrative: 76 year old with past medical history significant for dementia type 2 diabetes, hyperlipidemia, hypertension, CKD stage IIIb, CAD, peripheral artery disease, prior CVA, seizure disorder, OSA, COPD former smoker quit 2015 who presents complaining of productive cough.  She is a poor historian due to history of dementia.  CTA chest was negative for PE, lungs clear no pleural effusion.  She was found to have elevation of troponin which peaked to 304.  Cardiology was consulted and recommendation was to start heparin drip and blood pressure control.  Patient was found to have  blood pressure 206/64.    Assessment & Plan:   Principal Problem:   Elevated troponin Active Problems:   Seizure disorder as sequela of cerebrovascular accident ([place])   CAD (coronary artery disease)   Diabetes mellitus type 2 with neurological manifestations ([place])   Type II diabetes mellitus with neurological manifestations ([place])   CKD (chronic kidney disease) stage 3, GFR 30-59 ml/min (HCC)   1-Elevation of troponin: Cardiology following.  Thought to be secondary to demand ischemia in setting Bronchitis, COPD.  On Heparin gtt for 48 hours.    Acute  COPD exacerbation:  She has cough, SOB.  Bronchitis -Continue with Antibiotics.  -Nebulizer.  -Will add brovana, Pulmicort.  -flutter valve, guaifenesin.  -Start prednisone -speech evaluation.   Hypertension uncontrolled Continue with Norvasc and metoprolol.  Diabetes type 2 with hyperglycemia Sliding scale insulin, singling 15 units  Hyperlipidemia: Continue with Crestor  Chronic diastolic heart failure: Continue with the spironolactone   Seizure disorder: Continue with Dilantin Lamictal.  Dementia: Delirium precaution Check B12  Chronic hyponatremia/chronic CKD 3B: Monitor  Estimated body mass index is 24.1 kg/m as  calculated from the following:   Height as of this encounter: '5\' 3"'$  (1.6 m).   Weight as of this encounter: 61.7 kg.   DVT prophylaxis: Heparin drip Code Status: DNR, discussed with daughter  Family Communication: Daughter Disposition Plan:  Status is: Inpatient Remains inpatient appropriate because: management of COPD    Consultants:  Cardiology   Procedures:    Antimicrobials:    Subjective: She has productive cough, since Tuesday. She has SOB.   Objective: Vitals:   10/02/21 0747 10/02/21 0824 10/02/21 1300 10/02/21 1458  BP: (!) 199/78 (!) 173/93 (!) 165/77   Pulse: 87  82   Resp: 18  17   Temp: 97.7 F (36.5 C)  97.7 F (36.5 C)   TempSrc: Oral  Oral   SpO2: 94%  97% 97%  Weight:      Height:        Intake/Output Summary (Last 24 hours) at 10/02/2021 1538 Last data filed at 10/02/2021 1500 Gross per 24 hour  Intake 924.02 ml  Output 500 ml  Net 424.02 ml   Filed Weights   09/30/21 1659  Weight: 61.7 kg    Examination:  General exam: Appears calm and comfortable  Respiratory system: BL wheezing Cardiovascular system: S1 & S2 heard, RRR. No JVD, murmurs, rubs, gallops or clicks. No pedal edema. Gastrointestinal system: Abdomen is nondistended, soft and nontender. No organomegaly or masses felt. Normal bowel sounds heard. Central nervous system: Alert    Data Reviewed: I have personally reviewed following labs and imaging studies  CBC: Recent Labs  Lab 09/30/21 1811 09/30/21 1933 10/01/21 0736  WBC 11.8*  --  13.0*  HGB 10.7* 11.6* 10.8*  HCT 32.0* 34.0* 32.6*  MCV 86.0  --  84.7  PLT 215  --  676   Basic Metabolic Panel: Recent Labs  Lab 09/30/21 1811 09/30/21 1933 10/01/21 0736 10/02/21 0512  NA 129* 133* 129* 130*  K 4.2 4.4 4.4 4.3  CL 97*  --  98 99  CO2 25  --  22 19*  GLUCOSE 108*  --  182* 206*  BUN 17  --  18 18  CREATININE 1.16*  --  1.17* 0.97  CALCIUM 10.4*  --  10.2 9.1  MG  --   --   --  2.0  PHOS  --   --   --   3.1   GFR: Estimated Creatinine Clearance: 41.5 mL/min (by C-G formula based on SCr of 0.97 mg/dL). Liver Function Tests: Recent Labs  Lab 09/30/21 1811  AST 15  ALT 11  ALKPHOS 82  BILITOT 0.4  PROT 9.7*  ALBUMIN 4.1   No results for input(s): "LIPASE", "AMYLASE" in the last 168 hours. No results for input(s): "AMMONIA" in the last 168 hours. Coagulation Profile: No results for input(s): "INR", "PROTIME" in the last 168 hours. Cardiac Enzymes: No results for input(s): "CKTOTAL", "CKMB", "CKMBINDEX", "TROPONINI" in the last 168 hours. BNP (last 3 results) No results for input(s): "PROBNP" in the last 8760 hours. HbA1C: Recent Labs    10/02/21 0512  HGBA1C 6.8*   CBG: Recent Labs  Lab 10/02/21 0105 10/02/21 0249 10/02/21 0558 10/02/21 0811 10/02/21 1219  GLUCAP 210* 236* 185* 149* 112*   Lipid Profile: Recent Labs    10/02/21 0512  CHOL 123  HDL 49  LDLCALC 61  TRIG 66  CHOLHDL 2.5   Thyroid Function Tests: No results for input(s): "TSH", "T4TOTAL", "FREET4", "T3FREE", "THYROIDAB" in the last 72 hours. Anemia Panel: Recent Labs    10/02/21 0512 10/02/21 1140  VITAMINB12  --  441  FOLATE 7.5  --   FERRITIN 203  --   TIBC  --  220*  IRON  --  49  RETICCTPCT 1.8  --    Sepsis Labs: Recent Labs  Lab 10/02/21 0512  PROCALCITON 0.15    Recent Results (from the past 240 hour(s))  Resp Panel by RT-PCR (Flu A&B, Covid) Anterior Nasal Swab     Status: None   Collection Time: 09/30/21  6:11 PM   Specimen: Anterior Nasal Swab  Result Value Ref Range Status   SARS Coronavirus 2 by RT PCR NEGATIVE NEGATIVE Final    Comment: (NOTE) SARS-CoV-2 target nucleic acids are NOT DETECTED.  The SARS-CoV-2 RNA is generally detectable in upper respiratory specimens during the acute phase of infection. The lowest concentration of SARS-CoV-2 viral copies this assay can detect is 138 copies/mL. A negative result does not preclude SARS-Cov-2 infection and should  not be used as the sole basis for treatment or other patient management decisions. A negative result may occur with  improper specimen collection/handling, submission of specimen other than nasopharyngeal swab, presence of viral mutation(s) within the areas targeted by this assay, and inadequate number of viral copies(<138 copies/mL). A negative result must be combined with clinical observations, patient history, and epidemiological information. The expected result is Negative.  Fact Sheet for Patients:  EntrepreneurPulse.com.au  Fact Sheet for Healthcare Providers:  IncredibleEmployment.be  This test is no t yet approved or cleared by the [place] FDA and  has been authorized for detection and/or diagnosis of SARS-CoV-2 by FDA under an Emergency Use Authorization (EUA). This EUA will remain  in effect (  meaning this test can be used) for the duration of the COVID-19 declaration under Section 564(b)(1) of the Act, 21 U.S.C.section 360bbb-3(b)(1), unless the authorization is terminated  or revoked sooner.       Influenza A by PCR NEGATIVE NEGATIVE Final   Influenza B by PCR NEGATIVE NEGATIVE Final    Comment: (NOTE) The Xpert Xpress SARS-CoV-2/FLU/RSV plus assay is intended as an aid in the diagnosis of influenza from Nasopharyngeal swab specimens and should not be used as a sole basis for treatment. Nasal washings and aspirates are unacceptable for Xpert Xpress SARS-CoV-2/FLU/RSV testing.  Fact Sheet for Patients: EntrepreneurPulse.com.au  Fact Sheet for Healthcare Providers: IncredibleEmployment.be  This test is not yet approved or cleared by the [place] FDA and has been authorized for detection and/or diagnosis of SARS-CoV-2 by FDA under an Emergency Use Authorization (EUA). This EUA will remain in effect (meaning this test can be used) for the duration of the COVID-19 declaration under  Section 564(b)(1) of the Act, 21 U.S.C. section 360bbb-3(b)(1), unless the authorization is terminated or revoked.  Performed at [facility], [address] [place],  [id number]          Radiology Studies: ECHOCARDIOGRAM COMPLETE  Result Date: 10/02/2021    ECHOCARDIOGRAM REPORT   Patient Name:   Kathryn Lewis Dejager Date of Exam: 10/02/2021 Medical Rec #:  [id number]        Height:       63.0 in Accession #:    [id number]       Weight:       136.0 lb Date of Birth:  30-Nov-1945        BSA:          1.641 m Patient Age:    31 years         BP:           199/78 mmHg Patient Gender: F                HR:           96 bpm. Exam Location:  Inpatient Procedure: 2D Echo, Cardiac Doppler and Color Doppler Indications:    Elevated troponin  History:        Patient has prior history of Echocardiogram examinations, most                 recent 06/29/2018. Previous Myocardial Infarction and CAD, COPD,                 Signs/Symptoms:Shortness of Breath; Risk Factors:Diabetes and                 Hypertension. Hx CVA. PAD.  Sonographer:    Clayton Lefort RDCS (AE) Referring Phys: [id number] [place]  1. Left ventricular ejection fraction, by estimation, is 70 to 75%. The left ventricle has hyperdynamic function. The left ventricle has no regional wall motion abnormalities. There is mild left ventricular hypertrophy of the basal-septal segment. Left ventricular diastolic parameters are consistent with Grade I diastolic dysfunction (impaired relaxation). Elevated left atrial pressure.  2. Right ventricular systolic function is normal. The right ventricular size is normal.  3. The mitral valve is normal in structure. No evidence of mitral valve regurgitation. No evidence of mitral stenosis.  4. The aortic valve was not well visualized. Aortic valve regurgitation is not visualized. No aortic stenosis is present.  5. The inferior vena cava is normal in size with greater than 50% respiratory  variability, suggesting right atrial  pressure of 3 mmHg. FINDINGS  Left Ventricle: Left ventricular ejection fraction, by estimation, is 70 to 75%. The left ventricle has hyperdynamic function. The left ventricle has no regional wall motion abnormalities. The left ventricular internal cavity size was normal in size. There is mild left ventricular hypertrophy of the basal-septal segment. Left ventricular diastolic parameters are consistent with Grade I diastolic dysfunction (impaired relaxation). Elevated left atrial pressure. Right Ventricle: The right ventricular size is normal. Right ventricular systolic function is normal. Left Atrium: Left atrial size was normal in size. Right Atrium: Right atrial size was normal in size. Pericardium: There is no evidence of pericardial effusion. Mitral Valve: The mitral valve is normal in structure. No evidence of mitral valve regurgitation. No evidence of mitral valve stenosis. MV peak gradient, 6.9 mmHg. The mean mitral valve gradient is 3.0 mmHg. Tricuspid Valve: The tricuspid valve is normal in structure. Tricuspid valve regurgitation is trivial. No evidence of tricuspid stenosis. Aortic Valve: The aortic valve was not well visualized. Aortic valve regurgitation is not visualized. No aortic stenosis is present. Aortic valve mean gradient measures 6.0 mmHg. Aortic valve peak gradient measures 11.6 mmHg. Aortic valve area, by VTI measures 1.45 cm. Pulmonic Valve: The pulmonic valve was not well visualized. Pulmonic valve regurgitation is not visualized. No evidence of pulmonic stenosis. Aorta: The aortic root is normal in size and structure. Venous: The inferior vena cava is normal in size with greater than 50% respiratory variability, suggesting right atrial pressure of 3 mmHg. IAS/Shunts: No atrial level shunt detected by color flow Doppler.  LEFT VENTRICLE PLAX 2D LVIDd:         3.10 cm   Diastology LVIDs:         2.20 cm   LV e' medial:    4.46 cm/s LV PW:         1.20  cm   LV E/e' medial:  20.7 LV IVS:        1.70 cm   LV e' lateral:   5.44 cm/s LVOT diam:     1.80 cm   LV E/e' lateral: 17.0 LV SV:         47 LV SV Index:   29 LVOT Area:     2.54 cm  RIGHT VENTRICLE RV Basal diam:  2.50 cm RV S prime:     14.70 cm/s TAPSE (M-mode): 2.5 cm LEFT ATRIUM             Index        RIGHT ATRIUM           Index LA diam:        2.30 cm 1.40 cm/m   RA Area:     10.20 cm LA Vol (A2C):   30.1 ml 18.34 ml/m  RA Volume:   17.80 ml  10.84 ml/m LA Vol (A4C):   31.2 ml 19.01 ml/m LA Biplane Vol: 33.2 ml 20.23 ml/m  AORTIC VALVE AV Area (Vmax):    1.63 cm AV Area (Vmean):   1.60 cm AV Area (VTI):     1.45 cm AV Vmax:           170.00 cm/s AV Vmean:          114.000 cm/s AV VTI:            0.327 m AV Peak Grad:      11.6 mmHg AV Mean Grad:      6.0 mmHg LVOT Vmax:  109.00 cm/s LVOT Vmean:        71.900 cm/s LVOT VTI:          0.186 m LVOT/AV VTI ratio: 0.57  AORTA Ao Root diam: 2.50 cm Ao Asc diam:  2.60 cm MITRAL VALVE MV Area (PHT): 2.56 cm     SHUNTS MV Area VTI:   2.47 cm     Systemic VTI:  0.19 m MV Peak grad:  6.9 mmHg     Systemic Diam: 1.80 cm MV Mean grad:  3.0 mmHg MV Vmax:       1.31 m/s MV Vmean:      85.2 cm/s MV Decel Time: 296 msec MV E velocity: 92.40 cm/s MV A velocity: 121.00 cm/s MV E/A ratio:  0.76 Kirk Ruths MD Electronically signed by Kirk Ruths MD Signature Date/Time: 10/02/2021/11:33:52 AM    Final    CT Angio Chest PE W and/or Wo Contrast  Result Date: 09/30/2021 CLINICAL DATA:  Concern for pulmonary embolism. EXAM: CT ANGIOGRAPHY CHEST WITH CONTRAST TECHNIQUE: Multidetector CT imaging of the chest was performed using the standard protocol during bolus administration of intravenous contrast. Multiplanar CT image reconstructions and MIPs were obtained to evaluate the vascular anatomy. RADIATION DOSE REDUCTION: This exam was performed according to the departmental dose-optimization program which includes automated exposure control, adjustment of  the mA and/or kV according to patient size and/or use of iterative reconstruction technique. CONTRAST:  62m OMNIPAQUE IOHEXOL 350 MG/ML SOLN COMPARISON:  Chest radiograph dated 09/30/2021. FINDINGS: Cardiovascular: There is no cardiomegaly or pericardial effusion. Mild atherosclerotic calcification of the thoracic aorta. No aneurysmal dilatation. The origins of the great vessels of the aortic arch appear patent. No pulmonary artery embolus identified. Mediastinum/Nodes: No hilar or mediastinal adenopathy. Top-normal left hilar lymph node measures 8 mm. The esophagus and the thyroid gland are grossly unremarkable. No mediastinal fluid collection. Lungs/Pleura: The lungs are clear. There is no pleural effusion pneumothorax. The central airways are patent. Upper Abdomen: No acute abnormality. Musculoskeletal: No chest wall abnormality. No acute or significant osseous findings. Review of the MIP images confirms the above findings. IMPRESSION: 1. No acute intrathoracic pathology. No CT evidence of pulmonary artery embolus. 2. Aortic Atherosclerosis (ICD10-I70.0). Electronically Signed   By: AAnner CreteM.D.   On: 09/30/2021 21:04   DG Chest Port 1 View  Result Date: 09/30/2021 CLINICAL DATA:  Shortness of breath and cough beginning last night. EXAM: PORTABLE CHEST 1 VIEW COMPARISON:  One-view chest x-ray 03/14/2019. FINDINGS: The heart is enlarged. Atherosclerotic calcifications are present at the aorta. A moderate-sized hiatal hernia is noted. Lung volumes are low. Chronic interstitial coarsening is present without superimposed airspace disease. Degenerative changes are noted both shoulders. Cervical spine surgery noted. IMPRESSION: 1. Cardiomegaly without failure. 2. No acute cardiopulmonary disease. 3. Moderate-sized hiatal hernia. Electronically Signed   By: CSan MorelleM.D.   On: 09/30/2021 17:42        Scheduled Meds:  amLODipine  10 mg Oral Daily   arformoterol  15 mcg Nebulization BID    aspirin EC  81 mg Oral Daily   budesonide (PULMICORT) nebulizer solution  0.25 mg Nebulization BID   clopidogrel  75 mg Oral Daily   ezetimibe  10 mg Oral Daily   guaiFENesin  1,200 mg Oral BID   insulin aspart  0-9 Units Subcutaneous Q4H   insulin glargine-yfgn  15 Units Subcutaneous QHS   ipratropium-albuterol  3 mL Nebulization Q6H   isosorbide mononitrate  30 mg Oral Daily   lamoTRIgine  300 mg Oral BID   losartan  50 mg Oral Daily   metoprolol tartrate  50 mg Oral BID   phenytoin  30 mg Oral QHS   phenytoin  300 mg Oral Daily   predniSONE  40 mg Oral Q breakfast   rosuvastatin  40 mg Oral Daily   sodium chloride HYPERTONIC  4 mL Nebulization Daily   spironolactone  25 mg Oral Daily   [START ON 10/03/2021] umeclidinium bromide  1 puff Inhalation Daily   Continuous Infusions:  azithromycin 500 mg (10/02/21 0049)   cefTRIAXone (ROCEPHIN)  IV 2 g (10/01/21 2327)   heparin 1,000 Units/hr (10/02/21 0743)     LOS: 1 day    Time spent: 35 minutes   Toneshia Coello A Jaimya Feliciano, MD [facility]   If 7PM-7AM, please contact night-coverage [email or web address]  10/02/2021, 3:38 PM

## 2021-10-02 NOTE — Evaluation (Signed)
Clinical/Bedside Swallow Evaluation Patient Details  Name: Kathryn Lewis MRN: [id number] Date of Birth: [birth year]  Today's Date: 10/02/2021 Time: SLP Start Time (ACUTE ONLY): 57 SLP Stop Time (ACUTE ONLY): 1440 SLP Time Calculation (min) (ACUTE ONLY): 22 min  Past Medical History:  Past Medical History:  Diagnosis Date   Allergy    Arthritis    Bipolar 1 disorder ([place])    Blood transfusion without reported diagnosis    CAD (coronary artery disease) 04/17/2015   Carotid artery stenosis    50-69% bilateral ICA stenoses by velocity criteria but no visible plaque and ICA/CCA ratio 1.72 on right and 1.38 on left   Chronic airway obstruction, not elsewhere classified 03/02/2010   Overview:  Chronic Obstructive Pulmonary Disease   CKD (chronic kidney disease) stage 3, GFR 30-59 ml/min ([place]) 09/18/2021   Coronary artery disease    Depression    Diabetes mellitus without complication (HCC)    Echocardiogram    Echo 06/2018: Hyperdynamic systolic function, EF >28, mild concentric LVH, elevated LVEDP (E/E' suggests impaired relaxation), mild AI, lipomatous interatrial septum   Hypertension    Left adrenal mass (HCC)    2.5 cm L adrenal mass on CT per note of Dr. Seleta Rhymes 05/20/2014   MI, old    PAD (peripheral artery disease) ([place])    03/2014 - CT aortogram with lower extremity runoff (mild to moderate disease in common iliac arteries, complete occulusion of her bilateral external iliac arteries with heavily disease common femoral arteries. She also has disease to her SFAs bilaterally. Popliteal arteries and tibial vessel runoff appears to be adequate)   Seizures ([place])    Stroke [facility])    Past Surgical History:  Past Surgical History:  Procedure Laterality Date   BREAST SURGERY  2012   small lump nonmilignant   NECK SURGERY     HPI:  Kathryn Lewis is a 76 y.o. female with medical history significant for type 2 diabetes, COPD, hyperlipidemia, hypertension, CKD 3B, coronary artery disease,  peripheral artery disease, CVA, seizure disorder, OSA, who initially presented to [facility] ED with complaints of persistent nonproductive cough. CTA chest was negative for pulmonary embolism and unequivocal for pneumonia.  COVID-19 negative.  Troponin up trended, peaked at 304.    Assessment / Plan / Recommendation  Clinical Impression  Pt's baseline cough is impeding her ability to effectively coordinate swallow and respirations. She initiates mastication, then frequently begins to cough due to lung process which causes her to fatigue and/or leads to expectoration of her food. Do not suspect she is aspirating solids or liquids but opportunity may be increased if apenic period of swallow is reduced. She needed frequent breaks and was only able to consume 3 or 4 bites during assessment. She prefers to drink stating her throat is very dry. Discussed with pt and daughter to control the "what" and the "when" related to food. For instance, order softer foods/liquids that do not require much mastication. Eat when not experiencing significant coughing, take frequent breaks or eat smaller more frequent meals. Pt has decreased vision in right eye and daugher stated she would help her order appropriate foods from menu. Continue regular texture (to allow more choices), thin liquids, pills whole in puree, sit upright, take breaks when needed. ST will plan to follow up briefly. SLP Visit Diagnosis: Dysphagia, unspecified (R13.10)    Aspiration Risk  Mild aspiration risk    Diet Recommendation Regular;Thin liquid   Liquid Administration via: Straw;Cup Medication Administration: Whole meds with puree  Supervision: Patient able to self feed Compensations: Minimize environmental distractions Postural Changes: Seated upright at 90 degrees    Other  Recommendations Oral Care Recommendations: Oral care BID    Recommendations for follow up therapy are one component of a multi-disciplinary discharge planning process,  led by the attending physician.  Recommendations may be updated based on patient status, additional functional criteria and insurance authorization.  Follow up Recommendations No SLP follow up      Assistance Recommended at Discharge None  Functional Status Assessment Patient has had a recent decline in their functional status and demonstrates the ability to make significant improvements in function in a reasonable and predictable amount of time.  Frequency and Duration min 1 x/week  1 week       Prognosis        Swallow Study   General Date of Onset: 10/02/21 HPI: Kathryn Lewis is a 76 y.o. female with medical history significant for type 2 diabetes, COPD, hyperlipidemia, hypertension, CKD 3B, coronary artery disease, peripheral artery disease, CVA, seizure disorder, OSA, who initially presented to [facility] ED with complaints of persistent nonproductive cough. CTA chest was negative for pulmonary embolism and unequivocal for pneumonia.  COVID-19 negative.  Troponin up trended, peaked at 304. Type of Study: Bedside Swallow Evaluation Previous Swallow Assessment:  (none) Diet Prior to this Study: Regular;Thin liquids Temperature Spikes Noted: No Respiratory Status: Room air History of Recent Intubation: No Behavior/Cognition: Alert;Cooperative;Pleasant mood Oral Cavity Assessment: Within Functional Limits Oral Care Completed by SLP: No Oral Cavity - Dentition: Dentures, top (no lower) Vision: Functional for self-feeding (does have decreased vision in left eye after stroke) Self-Feeding Abilities: Able to feed self Patient Positioning: Upright in bed Baseline Vocal Quality: Normal Volitional Cough: Strong Volitional Swallow: Able to elicit    Oral/Motor/Sensory Function Overall Oral Motor/Sensory Function: Within functional limits   Ice Chips Ice chips: Not tested   Thin Liquid Thin Liquid: Impaired Pharyngeal  Phase Impairments: Cough - Delayed    Nectar Thick Nectar  Thick Liquid: Not tested   Honey Thick Honey Thick Liquid: Not tested   Puree Puree: Within functional limits   Solid     Solid: Impaired Pharyngeal Phase Impairments: Cough - Delayed      Mick Sell Orbie Pyo 10/02/2021,3:57 PM

## 2021-10-02 NOTE — Progress Notes (Signed)
ANTICOAGULATION CONSULT NOTE - follow-up  Pharmacy Consult for heparin Indication: chest pain/ACS  Allergies  Allergen Reactions   Amoxicillin Other (See Comments)    colitis   Augmentin [Amoxicillin-Pot Clavulanate] Other (See Comments)    colitis   Cephalexin Other (See Comments)    colitis    Patient Measurements: Height: '5\' 3"'$  (160 cm) Weight: 61.7 kg (136 lb 0.4 oz) IBW/kg (Calculated) : 52.4 Heparin Dosing Weight: 62 kg  Vital Signs: Temp: 97.7 F (36.5 C) (09/08 1300) Temp Source: Oral (09/08 1300) BP: 165/77 (09/08 1300) Pulse Rate: 82 (09/08 1300)  Labs: Recent Labs    09/30/21 1811 09/30/21 1933 09/30/21 2318 10/01/21 0736 10/01/21 1845 10/01/21 2026 10/01/21 2236 10/02/21 0512 10/02/21 1140  HGB 10.7* 11.6*  --  10.8*  --   --   --   --   --   HCT 32.0* 34.0*  --  32.6*  --   --   --   --   --   PLT 215  --   --  241  --   --   --   --   --   HEPARINUNFRC  --   --   --   --  0.15*  --   --  0.41 0.32  CREATININE 1.16*  --   --  1.17*  --   --   --  0.97  --   TROPONINIHS 10  --    < > 304*  --  328* 351*  --   --    < > = values in this interval not displayed.     Estimated Creatinine Clearance: 41.5 mL/min (by C-G formula based on SCr of 0.97 mg/dL).   Medical History: Past Medical History:  Diagnosis Date   Allergy    Arthritis    Bipolar 1 disorder ([place])    Blood transfusion without reported diagnosis    Carotid artery stenosis    50-69% bilateral ICA stenoses by velocity criteria but no visible plaque and ICA/CCA ratio 1.72 on right and 1.38 on left   Coronary artery disease    Depression    Diabetes mellitus without complication (HCC)    Echocardiogram    Echo 06/2018: Hyperdynamic systolic function, EF >63, mild concentric LVH, elevated LVEDP (E/E' suggests impaired relaxation), mild AI, lipomatous interatrial septum   Hypertension    Left adrenal mass (HCC)    2.5 cm L adrenal mass on CT per note of Dr. Seleta Rhymes 05/20/2014   MI, old     PAD (peripheral artery disease) ([place])    03/2014 - CT aortogram with lower extremity runoff (mild to moderate disease in common iliac arteries, complete occulusion of her bilateral external iliac arteries with heavily disease common femoral arteries. She also has disease to her SFAs bilaterally. Popliteal arteries and tibial vessel runoff appears to be adequate)   Seizures (HCC)    Stroke (HCC)     Medications:  Awaiting med rec  Assessment: 76 y.o. F presents with SOB/cough. She is on heparin for r/o ACS. Cards plan for medical management at this time (heparin for 48 hrs). No AC PTA. -heparin level at goal on 1000 units/hr  Goal of Therapy:  Heparin level 0.3-0.7 units/ml Monitor platelets by anticoagulation protocol: Yes   Plan:  Continue Heparin gtt at 1000 units/hr Daily heparin level and CBC Anticipate heparin to stop early on 9/9  Hildred Laser, PharmD Clinical Pharmacist **Pharmacist phone directory can now be found on [email or web address] ([email or web address]).  Listed under [place].

## 2021-10-02 NOTE — TOC Initial Note (Addendum)
Transition of Care [facility]) - Initial/Assessment Note    Patient Details  Name: Kathryn Lewis MRN: [id number] Date of Birth: Jun 23, 1945  [facility] CM/SW Contact:    Milas Gain, [place] Phone Number: 10/02/2021, 4:20 PM  Clinical Narrative:                  Patient is from [facility]. Patient with patients daughter Kathryn Lewis confirmed plan is for patient to return to [facility] when medically ready for dc . CSW will continue to follow and assist with patients dc planning needs.       Patient Goals and CMS Choice        Expected Discharge Plan and Services                                                Prior Living Arrangements/Services                       Activities of Daily Living      Permission Sought/Granted                  Emotional Assessment              Admission diagnosis:  Viral URI [J06.9] Elevated troponin [R77.8] COPD exacerbation ([place]) [J44.1] Patient Active Problem List   Diagnosis Date Noted   Elevated troponin 10/01/2021   CKD (chronic kidney disease) stage 3, GFR 30-59 ml/min ([place]) 09/18/2021   Dementia without behavioral disturbance ([place]) 01/23/2019   Peripheral polyneuropathy 10/24/2018   PAD (peripheral artery disease) ([place]) 04/05/2018   Carotid artery disease ([place]) 04/05/2018   Hypokalemia 02/27/2018   Frequent falls 07/08/2017   Back pain 07/08/2017   Mixed hyperlipidemia 05/27/2017   Unstable gait 01/10/2017   Hyperlipidemia associated with type 2 diabetes mellitus ([place]) 01/10/2017   OSA on CPAP 12/24/2016   COPD (chronic obstructive pulmonary disease) ([place]) 12/24/2016   Pulmonary nodule 12/24/2016   Generalized weakness 12/12/2015   Diabetes mellitus type 2 with neurological manifestations ([place]) 12/12/2015   Hypertension with heart disease 12/12/2015   Acute bronchitis    Seizure disorder as sequela of cerebrovascular accident ([place]) 04/17/2015   CAD (coronary artery  disease) 04/17/2015   Anemia, unspecified 03/02/2010   Cough 03/02/2010   Depressive disorder, not elsewhere classified 03/02/2010   Pneumonia, organism unspecified([place]) 03/02/2010   Shortness of breath 03/02/2010   Type II diabetes mellitus with neurological manifestations ([place]) 03/02/2010   Wheezing 03/02/2010   Chronic airway obstruction, not elsewhere classified 03/02/2010   Essential hypertension 03/02/2010   Acute, but ill-defined, cerebrovascular disease 03/02/2010   PCP:  [place], Betty G, MD Pharmacy:   [facility] Perfect, [place] [address] [place] [address] Phone: [phone] Fax: [place], [place] [address] [place] [address] Phone: [phone] Fax: [phone]     Social Determinants of Health (SDOH) Interventions    Readmission Risk Interventions     No data to display

## 2021-10-02 NOTE — Progress Notes (Signed)
Mobility Specialist - Progress Note   10/02/21 1145  Mobility  Activity Ambulated with assistance in hallway  Level of Assistance Contact guard assist, steadying assist  Assistive Device Front wheel walker  Distance Ambulated (ft) 100 ft  Activity Response Tolerated well  $Mobility charge 1 Mobility   Pt was received in bed and agreeable to mobility. No c/o pain throughout ambulation. Pt was returned back to bed with all needs met.   Larey Seat

## 2021-10-02 NOTE — Progress Notes (Addendum)
Rounding Note    Patient Name: Kathryn Lewis Date of Encounter: 10/02/2021  [place] Cardiologist: Dorris Carnes, MD   Subjective   Cough started right before coming to the hospital. Denies any CP or SOB. Last cath at [facility] by Dr. Gildardo Griffes in 2015, moderate disease in the large vessels with severe 80% distal disease in D1, LAD and OM  Inpatient Medications    Scheduled Meds:  amLODipine  10 mg Oral Daily   aspirin EC  81 mg Oral Daily   clopidogrel  75 mg Oral Daily   ezetimibe  10 mg Oral Daily   insulin aspart  0-9 Units Subcutaneous Q4H   insulin glargine-yfgn  15 Units Subcutaneous QHS   ipratropium-albuterol  3 mL Nebulization Q6H   isosorbide mononitrate  30 mg Oral Daily   losartan  50 mg Oral Daily   metoprolol tartrate  50 mg Oral BID   phenytoin  30 mg Oral QHS   phenytoin  300 mg Oral Daily   rosuvastatin  40 mg Oral Daily   spironolactone  25 mg Oral Daily   tiotropium  18 mcg Inhalation Daily   Continuous Infusions:  azithromycin 500 mg (10/02/21 0049)   cefTRIAXone (ROCEPHIN)  IV 2 g (10/01/21 2327)   heparin 1,000 Units/hr (10/02/21 0743)   PRN Meds: acetaminophen, albuterol, guaiFENesin, hydrALAZINE, LORazepam, melatonin, phenol, polyethylene glycol, prochlorperazine   Vital Signs    Vitals:   10/02/21 0023 10/02/21 0127 10/02/21 0208 10/02/21 0423  BP: (!) 172/65 (!) 175/75 (!) 175/75 (!) 170/74  Pulse: 96  99 92  Resp: '16  16 18  '$ Temp: 98 F (36.7 C)   99.7 F (37.6 C)  TempSrc: Oral   Oral  SpO2:   99%   Weight:      Height:       No intake or output data in the 24 hours ending 10/02/21 0743    09/30/2021    4:59 PM 12/24/2020    1:28 PM  Last 3 Weights  Weight (lbs) 136 lb 0.4 oz 136 lb  Weight (kg) 61.7 kg 61.689 kg      Telemetry    NSR without significant ventricular ectopy - Personally Reviewed  ECG    Sinus tach with HR 120s - Personally Reviewed  Physical Exam   GEN: No acute distress.    Neck: No JVD Cardiac: RRR, no murmurs, rubs, or gallops.  Respiratory: bilateral rhonchi with wheezing.  GI: Soft, nontender, non-distended  MS: No edema; No deformity. Neuro:  Nonfocal  Psych: Normal affect   Labs    High Sensitivity Troponin:   Recent Labs  Lab 09/30/21 1811 09/30/21 2318 10/01/21 0736 10/01/21 2026 10/01/21 2236  TROPONINIHS 10 145* 304* 328* 351*     Chemistry Recent Labs  Lab 09/30/21 1811 09/30/21 1933 10/01/21 0736 10/02/21 0512  NA 129* 133* 129* 130*  K 4.2 4.4 4.4 4.3  CL 97*  --  98 99  CO2 25  --  22 19*  GLUCOSE 108*  --  182* 206*  BUN 17  --  18 18  CREATININE 1.16*  --  1.17* 0.97  CALCIUM 10.4*  --  10.2 9.1  MG  --   --   --  2.0  PROT 9.7*  --   --   --   ALBUMIN 4.1  --   --   --   AST 15  --   --   --  ALT 11  --   --   --   ALKPHOS 82  --   --   --   BILITOT 0.4  --   --   --   GFRNONAA 49*  --  49* >60  ANIONGAP 7  --  9 12    Lipids  Recent Labs  Lab 10/02/21 0512  CHOL 123  TRIG 66  HDL 49  LDLCALC 61  CHOLHDL 2.5    Hematology Recent Labs  Lab 09/30/21 1811 09/30/21 1933 10/01/21 0736  WBC 11.8*  --  13.0*  RBC 3.72*  --  3.85*  HGB 10.7* 11.6* 10.8*  HCT 32.0* 34.0* 32.6*  MCV 86.0  --  84.7  MCH 28.8  --  28.1  MCHC 33.4  --  33.1  RDW 14.4  --  14.2  PLT 215  --  241   Thyroid No results for input(s): "TSH", "FREET4" in the last 168 hours.  BNP Recent Labs  Lab 09/30/21 1811  BNP 62.3    DDimer No results for input(s): "DDIMER" in the last 168 hours.   Radiology    CT Angio Chest PE W and/or Wo Contrast  Result Date: 09/30/2021 CLINICAL DATA:  Concern for pulmonary embolism. EXAM: CT ANGIOGRAPHY CHEST WITH CONTRAST TECHNIQUE: Multidetector CT imaging of the chest was performed using the standard protocol during bolus administration of intravenous contrast. Multiplanar CT image reconstructions and MIPs were obtained to evaluate the vascular anatomy. RADIATION DOSE REDUCTION: This exam  was performed according to the departmental dose-optimization program which includes automated exposure control, adjustment of the mA and/or kV according to patient size and/or use of iterative reconstruction technique. CONTRAST:  16m OMNIPAQUE IOHEXOL 350 MG/ML SOLN COMPARISON:  Chest radiograph dated 09/30/2021. FINDINGS: Cardiovascular: There is no cardiomegaly or pericardial effusion. Mild atherosclerotic calcification of the thoracic aorta. No aneurysmal dilatation. The origins of the great vessels of the aortic arch appear patent. No pulmonary artery embolus identified. Mediastinum/Nodes: No hilar or mediastinal adenopathy. Top-normal left hilar lymph node measures 8 mm. The esophagus and the thyroid gland are grossly unremarkable. No mediastinal fluid collection. Lungs/Pleura: The lungs are clear. There is no pleural effusion pneumothorax. The central airways are patent. Upper Abdomen: No acute abnormality. Musculoskeletal: No chest wall abnormality. No acute or significant osseous findings. Review of the MIP images confirms the above findings. IMPRESSION: 1. No acute intrathoracic pathology. No CT evidence of pulmonary artery embolus. 2. Aortic Atherosclerosis (ICD10-I70.0). Electronically Signed   By: AAnner CreteM.D.   On: 09/30/2021 21:04   DG Chest Port 1 View  Result Date: 09/30/2021 CLINICAL DATA:  Shortness of breath and cough beginning last night. EXAM: PORTABLE CHEST 1 VIEW COMPARISON:  One-view chest x-ray 03/14/2019. FINDINGS: The heart is enlarged. Atherosclerotic calcifications are present at the aorta. A moderate-sized hiatal hernia is noted. Lung volumes are low. Chronic interstitial coarsening is present without superimposed airspace disease. Degenerative changes are noted both shoulders. Cervical spine surgery noted. IMPRESSION: 1. Cardiomegaly without failure. 2. No acute cardiopulmonary disease. 3. Moderate-sized hiatal hernia. Electronically Signed   By: CSan MorelleM.D.    On: 09/30/2021 17:42    Cardiac Studies   Echo 06/29/2018  1. The left ventricle has hyperdynamic systolic function, with an  ejection fraction of >65%. The cavity size was decreased. There is mild  concentric left ventricular hypertrophy. Left ventricular diastolic  Doppler parameters are indeterminate. Elevated  left ventricular end-diastolic pressure The E/e' is 18.6.   2. The right  ventricle has normal systolic function. The cavity was  normal. There is no increase in right ventricular wall thickness. Right  ventricular systolic pressure could not be assessed.   3. The aortic valve is tricuspid. Mild thickening of the aortic valve.  Mild calcification of the aortic valve. Aortic valve regurgitation is mild  by color flow Doppler.   4. The ascending aorta, aortic arch and aortic root are normal in size  and structure.   5. The interatrial septum appears to be lipomatous.   Patient Profile     76 y.o. female with PMH of CAD, seizure, PAD, carotid artery disease, HTN, HLD, bipolar and COPD presented with SOB and found to have elevated troponin  Assessment & Plan    Elevated troponin  - serial trop 10-->145-->304--> 328-->351   - occurred in the setting of COPD exacerbation/URI/PNA, uncontrolled BP and known severe distal vessel disease  - denies any chest pain, started having SOB a few days prior to arrival  - could be demand ischemia vs mild NSTEMI in the setting of uncontrolled BP, URI and known CAD  - continue IV heparin for 48 hours. Continue ASA, plavix, Imdur 30, metoprolol and crestor  - pending echocardiogram, patient has some underlying dementia, if echo is normal, may consider treat it medically  CAD: Cath 2015 at [place] LAD - p50, m80, d90; D1 p60, d80; mLCx 40, mOM1 80, RCA - p20, m70, d20, EF 65. Medical management recommended at the time  Uncontrolled HTN: BP was normal when she saw her PCP on 09/18/2021 and 01/30/2021. Likely caused by upper respiratory  issue/COPD exaceration/atypical PNA. Amlodipine increased to '10mg'$  this morning. Continue metoprolol tartrate '50mg'$  BID, spironolactone '25mg'$  daily and Imdur '30mg'$  daily.   HLD: continue crestor '40mg'$  daily.   Acute respiratory failure: hospitalist suspect atypical PNA, COPD exacerbation. Treated with abx. Continue to have frequent cough and bilateral rhonchi. Received 2 doses of IV steroid so far. Negative COVID and influenza  Hyponatemia: stable at 130. Related to pulmonary issue?      For questions or updates, please contact [place] Please consult [email or web address] for contact info under        Signed, Almyra Deforest, [place]  10/02/2021, 7:43 AM    Patient examined chart reviewed no chest pain Coughing a lot with rhonchi and wheezing on exam. Known moderate CAD Demand ischemia in setting of HTN and URI/viral. With no chest pain or functional status and dementia would use heparin for 48 hours Rx infection and observe TTE would not be aggressive with cath in this elderly female with no chest pain and dementia  Jenkins Rouge MD [facility]

## 2021-10-02 NOTE — Progress Notes (Signed)
ANTICOAGULATION CONSULT NOTE - follow-up  Pharmacy Consult for heparin Indication: chest pain/ACS  Allergies  Allergen Reactions   Amoxicillin Other (See Comments)    colitis   Augmentin [Amoxicillin-Pot Clavulanate] Other (See Comments)    colitis   Cephalexin Other (See Comments)    colitis    Patient Measurements: Height: '5\' 3"'$  (160 cm) Weight: 61.7 kg (136 lb 0.4 oz) IBW/kg (Calculated) : 52.4 Heparin Dosing Weight: 62 kg  Vital Signs: Temp: 99.7 F (37.6 C) (09/08 0423) Temp Source: Oral (09/08 0423) BP: 170/74 (09/08 0423) Pulse Rate: 92 (09/08 0423)  Labs: Recent Labs    09/30/21 1811 09/30/21 1933 09/30/21 2318 10/01/21 0736 10/01/21 1845 10/01/21 2026 10/01/21 2236 10/02/21 0512  HGB 10.7* 11.6*  --  10.8*  --   --   --   --   HCT 32.0* 34.0*  --  32.6*  --   --   --   --   PLT 215  --   --  241  --   --   --   --   HEPARINUNFRC  --   --   --   --  0.15*  --   --  0.41  CREATININE 1.16*  --   --  1.17*  --   --   --   --   TROPONINIHS 10  --    < > 304*  --  328* 351*  --    < > = values in this interval not displayed.     Estimated Creatinine Clearance: 34.4 mL/min (A) (by C-G formula based on SCr of 1.17 mg/dL (H)).   Medical History: Past Medical History:  Diagnosis Date   Allergy    Arthritis    Bipolar 1 disorder ([place])    Blood transfusion without reported diagnosis    Carotid artery stenosis    50-69% bilateral ICA stenoses by velocity criteria but no visible plaque and ICA/CCA ratio 1.72 on right and 1.38 on left   Coronary artery disease    Depression    Diabetes mellitus without complication (HCC)    Echocardiogram    Echo 06/2018: Hyperdynamic systolic function, EF >56, mild concentric LVH, elevated LVEDP (E/E' suggests impaired relaxation), mild AI, lipomatous interatrial septum   Hypertension    Left adrenal mass (HCC)    2.5 cm L adrenal mass on CT per note of Dr. Seleta Rhymes 05/20/2014   MI, old    PAD (peripheral artery disease)  ([place])    03/2014 - CT aortogram with lower extremity runoff (mild to moderate disease in common iliac arteries, complete occulusion of her bilateral external iliac arteries with heavily disease common femoral arteries. She also has disease to her SFAs bilaterally. Popliteal arteries and tibial vessel runoff appears to be adequate)   Seizures (HCC)    Stroke (HCC)     Medications:  Awaiting med rec  Assessment: 76 y.o. F presents with SOB/cough. Trop up to 145. To begin heparin for ACS. CBC ok on admission. Cards plan for medical management at this time. No AC PTA.  HL 0.41 which is therapeutic.  Goal of Therapy:  Heparin level 0.3-0.7 units/ml Monitor platelets by anticoagulation protocol: Yes   Plan:  Continue Heparin gtt at 1000 units/hr Will f/u heparin level in 8 hours Daily heparin level and CBC  Selene Peltzer BS, PharmD, BCPS Clinical Pharmacist 10/02/2021 6:18 AM  Contact: [phone] after 3 PM  "Be curious, not judgmental..." -Jamal Maes

## 2021-10-02 NOTE — Progress Notes (Signed)
  Echocardiogram 2D Echocardiogram has been performed.  Kathryn Lewis 10/02/2021, 9:27 AM

## 2021-10-02 NOTE — Plan of Care (Signed)
  Problem: Clinical Measurements: Goal: Cardiovascular complication will be avoided Outcome: Progressing   Problem: Elimination: Goal: Will not experience complications related to urinary retention Outcome: Progressing   Problem: Safety: Goal: Ability to remain free from injury will improve Outcome: Progressing   Problem: Skin Integrity: Goal: Risk for impaired skin integrity will decrease Outcome: Progressing

## 2021-10-03 DIAGNOSIS — R778 Other specified abnormalities of plasma proteins: Secondary | ICD-10-CM | POA: Diagnosis not present

## 2021-10-03 LAB — BASIC METABOLIC PANEL
Anion gap: 11 (ref 5–15)
BUN: 20 mg/dL (ref 8–23)
CO2: 19 mmol/L — ABNORMAL LOW (ref 22–32)
Calcium: 9.2 mg/dL (ref 8.9–10.3)
Chloride: 102 mmol/L (ref 98–111)
Creatinine, Ser: 0.97 mg/dL (ref 0.44–1.00)
GFR, Estimated: >60 mL/min (ref >60)
Glucose, Bld: 106 mg/dL — ABNORMAL HIGH (ref 70–99)
Potassium: 4.4 mmol/L (ref 3.5–5.1)
Sodium: 132 mmol/L — ABNORMAL LOW (ref 135–145)

## 2021-10-03 LAB — GLUCOSE, CAPILLARY
Glucose-Capillary: 111 mg/dL — ABNORMAL HIGH (ref 70–99)
Glucose-Capillary: 111 mg/dL — ABNORMAL HIGH (ref 70–99)
Glucose-Capillary: 162 mg/dL — ABNORMAL HIGH (ref 70–99)
Glucose-Capillary: 168 mg/dL — ABNORMAL HIGH (ref 70–99)
Glucose-Capillary: 188 mg/dL — ABNORMAL HIGH (ref 70–99)
Glucose-Capillary: 312 mg/dL — ABNORMAL HIGH (ref 70–99)
Glucose-Capillary: 92 mg/dL (ref 70–99)

## 2021-10-03 LAB — CBC
HCT: 28.3 % — ABNORMAL LOW (ref 36.0–46.0)
Hemoglobin: 9.6 g/dL — ABNORMAL LOW (ref 12.0–15.0)
MCH: 28.8 pg (ref 26.0–34.0)
MCHC: 33.9 g/dL (ref 30.0–36.0)
MCV: 85 fL (ref 80.0–100.0)
Platelets: 240 10*3/uL (ref 150–400)
RBC: 3.33 MIL/uL — ABNORMAL LOW (ref 3.87–5.11)
RDW: 14.1 % (ref 11.5–15.5)
WBC: 12.7 10*3/uL — ABNORMAL HIGH (ref 4.0–10.5)
nRBC: 0 % (ref 0.0–0.2)

## 2021-10-03 LAB — HEPARIN LEVEL (UNFRACTIONATED): Heparin Unfractionated: 0.28 IU/mL — ABNORMAL LOW (ref 0.30–0.70)

## 2021-10-03 MED ORDER — HEPARIN SODIUM (PORCINE) 5000 UNIT/ML IJ SOLN
5000.0000 [IU] | Freq: Three times a day (TID) | INTRAMUSCULAR | Status: DC
  Administered 2021-10-03 – 2021-10-09 (×19): 5000 [IU] via SUBCUTANEOUS
  Filled 2021-10-03 (×19): qty 1

## 2021-10-03 MED ORDER — LOSARTAN POTASSIUM 50 MG PO TABS
100.0000 mg | ORAL_TABLET | Freq: Every day | ORAL | Status: DC
  Administered 2021-10-03 – 2021-10-09 (×7): 100 mg via ORAL
  Filled 2021-10-03 (×7): qty 2

## 2021-10-03 NOTE — Progress Notes (Signed)
Pt had activated bed alarm multiple times during shift very very forgetful about calling for assistance and that [place] is in place. Pt also pulled out an IV during the shift as well. Pt reoriented several times and states forgets things a lot since having a stroke in the past. Pt is now resting in bed appears to be asleep. Alarm remains in place for safety.

## 2021-10-03 NOTE — Progress Notes (Signed)
Mobility Specialist - Progress Note   10/03/21 1326  Mobility  Activity Refused mobility    Pt refused mobility stating she is exhausted and over did herself yesterday and wont be able to get up today.   Larey Seat

## 2021-10-03 NOTE — Progress Notes (Signed)
Rounding Note    Patient Name: Kathryn Lewis Date of Encounter: 10/03/2021  [place] Cardiologist: Dorris Carnes, MD   Subjective   No chest pain  Inpatient Medications    Scheduled Meds:  amLODipine  10 mg Oral Daily   arformoterol  15 mcg Nebulization BID   aspirin EC  81 mg Oral Daily   budesonide (PULMICORT) nebulizer solution  0.25 mg Nebulization BID   clopidogrel  75 mg Oral Daily   ezetimibe  10 mg Oral Daily   guaiFENesin  1,200 mg Oral BID   insulin aspart  0-9 Units Subcutaneous Q4H   insulin glargine-yfgn  15 Units Subcutaneous QHS   ipratropium-albuterol  3 mL Nebulization Q6H   isosorbide mononitrate  30 mg Oral Daily   lamoTRIgine  300 mg Oral BID   losartan  50 mg Oral Daily   metoprolol tartrate  50 mg Oral BID   phenytoin  30 mg Oral QHS   phenytoin  300 mg Oral Daily   predniSONE  40 mg Oral Q breakfast   rosuvastatin  40 mg Oral Daily   sodium chloride HYPERTONIC  4 mL Nebulization Daily   spironolactone  25 mg Oral Daily   umeclidinium bromide  1 puff Inhalation Daily   Continuous Infusions:  azithromycin 500 mg (10/03/21 0039)   cefTRIAXone (ROCEPHIN)  IV 2 g (10/02/21 2318)   heparin 1,000 Units/hr (10/02/21 0743)   PRN Meds: acetaminophen, albuterol, benzonatate, hydrALAZINE, LORazepam, melatonin, phenol, polyethylene glycol, prochlorperazine   Vital Signs    Vitals:   10/02/21 2112 10/02/21 2323 10/03/21 0057 10/03/21 0449  BP: (!) 182/78 (!) 185/90  (!) 197/84  Pulse: (!) 101 88 70 82  Resp: '18 18 20 19  '$ Temp: (!) 97.4 F (36.3 C) 98 F (36.7 C)  97.9 F (36.6 C)  TempSrc: Oral Oral  Oral  SpO2: 97% 95% 98% 99%  Weight:      Height:        Intake/Output Summary (Last 24 hours) at 10/03/2021 0725 Last data filed at 10/03/2021 0450 Gross per 24 hour  Intake 924.02 ml  Output 2850 ml  Net -1925.98 ml      09/30/2021    4:59 PM 12/24/2020    1:28 PM  Last 3 Weights  Weight (lbs) 136 lb 0.4 oz 136 lb  Weight  (kg) 61.7 kg 61.689 kg      Telemetry    SR - Personally Reviewed  ECG    N/a - Personally Reviewed  Physical Exam   GEN: No acute distress.   Neck: No JVD Cardiac: RRR, no murmurs, rubs, or gallops.  Respiratory: coarse bilaterally GI: Soft, nontender, non-distended  MS: No edema; No deformity. Neuro:  Nonfocal  Psych: Normal affect   Labs    High Sensitivity Troponin:   Recent Labs  Lab 09/30/21 1811 09/30/21 2318 10/01/21 0736 10/01/21 2026 10/01/21 2236  TROPONINIHS 10 145* 304* 328* 351*     Chemistry Recent Labs  Lab 09/30/21 1811 09/30/21 1933 10/01/21 0736 10/02/21 0512 10/03/21 0234  NA 129*   < > 129* 130* 132*  K 4.2   < > 4.4 4.3 4.4  CL 97*  --  98 99 102  CO2 25  --  22 19* 19*  GLUCOSE 108*  --  182* 206* 106*  BUN 17  --  '18 18 20  '$ CREATININE 1.16*  --  1.17* 0.97 0.97  CALCIUM 10.4*  --  10.2 9.1 9.2  MG  --   --   --  2.0  --   PROT 9.7*  --   --   --   --   ALBUMIN 4.1  --   --   --   --   AST 15  --   --   --   --   ALT 11  --   --   --   --   ALKPHOS 82  --   --   --   --   BILITOT 0.4  --   --   --   --   GFRNONAA 49*  --  49* >60 >60  ANIONGAP 7  --  '9 12 11   '$ < > = values in this interval not displayed.    Lipids  Recent Labs  Lab 10/02/21 0512  CHOL 123  TRIG 66  HDL 49  LDLCALC 61  CHOLHDL 2.5    Hematology Recent Labs  Lab 09/30/21 1811 09/30/21 1933 10/01/21 0736 10/02/21 0512 10/03/21 0234  WBC 11.8*  --  13.0*  --  12.7*  RBC 3.72*  --  3.85* 3.79* 3.33*  HGB 10.7* 11.6* 10.8*  --  9.6*  HCT 32.0* 34.0* 32.6*  --  28.3*  MCV 86.0  --  84.7  --  85.0  MCH 28.8  --  28.1  --  28.8  MCHC 33.4  --  33.1  --  33.9  RDW 14.4  --  14.2  --  14.1  PLT 215  --  241  --  240   Thyroid No results for input(s): "TSH", "FREET4" in the last 168 hours.  BNP Recent Labs  Lab 09/30/21 1811  BNP 62.3    DDimer No results for input(s): "DDIMER" in the last 168 hours.   Radiology    ECHOCARDIOGRAM  COMPLETE  Result Date: 10/02/2021    ECHOCARDIOGRAM REPORT   Patient Name:   Kathryn Lewis Stage Date of Exam: 10/02/2021 Medical Rec #:  [id number]        Height:       63.0 in Accession #:    [id number]       Weight:       136.0 lb Date of Birth:  07-Jul-1945        BSA:          1.641 m Patient Age:    76 years         BP:           199/78 mmHg Patient Gender: F                HR:           96 bpm. Exam Location:  Inpatient Procedure: 2D Echo, Cardiac Doppler and Color Doppler Indications:    Elevated troponin  History:        Patient has prior history of Echocardiogram examinations, most                 recent 06/29/2018. Previous Myocardial Infarction and CAD, COPD,                 Signs/Symptoms:Shortness of Breath; Risk Factors:Diabetes and                 Hypertension. Hx CVA. PAD.  Sonographer:    Clayton Lefort RDCS (AE) Referring Phys: [id number] [place]  1. Left ventricular ejection fraction, by estimation, is 70 to 75%. The left ventricle has hyperdynamic function.  The left ventricle has no regional wall motion abnormalities. There is mild left ventricular hypertrophy of the basal-septal segment. Left ventricular diastolic parameters are consistent with Grade I diastolic dysfunction (impaired relaxation). Elevated left atrial pressure.  2. Right ventricular systolic function is normal. The right ventricular size is normal.  3. The mitral valve is normal in structure. No evidence of mitral valve regurgitation. No evidence of mitral stenosis.  4. The aortic valve was not well visualized. Aortic valve regurgitation is not visualized. No aortic stenosis is present.  5. The inferior vena cava is normal in size with greater than 50% respiratory variability, suggesting right atrial pressure of 3 mmHg. FINDINGS  Left Ventricle: Left ventricular ejection fraction, by estimation, is 70 to 75%. The left ventricle has hyperdynamic function. The left ventricle has no regional wall motion abnormalities. The left  ventricular internal cavity size was normal in size. There is mild left ventricular hypertrophy of the basal-septal segment. Left ventricular diastolic parameters are consistent with Grade I diastolic dysfunction (impaired relaxation). Elevated left atrial pressure. Right Ventricle: The right ventricular size is normal. Right ventricular systolic function is normal. Left [facility]: Left atrial size was normal in size. Right [facility]: Right atrial size was normal in size. Pericardium: There is no evidence of pericardial effusion. Mitral Valve: The mitral valve is normal in structure. No evidence of mitral valve regurgitation. No evidence of mitral valve stenosis. MV peak gradient, 6.9 mmHg. The mean mitral valve gradient is 3.0 mmHg. Tricuspid Valve: The tricuspid valve is normal in structure. Tricuspid valve regurgitation is trivial. No evidence of tricuspid stenosis. Aortic Valve: The aortic valve was not well visualized. Aortic valve regurgitation is not visualized. No aortic stenosis is present. Aortic valve mean gradient measures 6.0 mmHg. Aortic valve peak gradient measures 11.6 mmHg. Aortic valve area, by VTI measures 1.45 cm. Pulmonic Valve: The pulmonic valve was not well visualized. Pulmonic valve regurgitation is not visualized. No evidence of pulmonic stenosis. Aorta: The aortic root is normal in size and structure. Venous: The inferior vena cava is normal in size with greater than 50% respiratory variability, suggesting right atrial pressure of 3 mmHg. IAS/Shunts: No atrial level shunt detected by color flow Doppler.  LEFT VENTRICLE PLAX 2D LVIDd:         3.10 cm   Diastology LVIDs:         2.20 cm   LV e' medial:    4.46 cm/s LV PW:         1.20 cm   LV E/e' medial:  20.7 LV IVS:        1.70 cm   LV e' lateral:   5.44 cm/s LVOT diam:     1.80 cm   LV E/e' lateral: 17.0 LV SV:         47 LV SV Index:   29 LVOT Area:     2.54 cm  RIGHT VENTRICLE RV Basal diam:  2.50 cm RV S prime:     14.70 cm/s TAPSE  (M-mode): 2.5 cm LEFT ATRIUM             Index        RIGHT ATRIUM           Index LA diam:        2.30 cm 1.40 cm/m   RA Area:     10.20 cm LA Vol (A2C):   30.1 ml 18.34 ml/m  RA Volume:   17.80 ml  10.84 ml/m LA Vol (A4C):  31.2 ml 19.01 ml/m LA Biplane Vol: 33.2 ml 20.23 ml/m  AORTIC VALVE AV Area (Vmax):    1.63 cm AV Area (Vmean):   1.60 cm AV Area (VTI):     1.45 cm AV Vmax:           170.00 cm/s AV Vmean:          114.000 cm/s AV VTI:            0.327 m AV Peak Grad:      11.6 mmHg AV Mean Grad:      6.0 mmHg LVOT Vmax:         109.00 cm/s LVOT Vmean:        71.900 cm/s LVOT VTI:          0.186 m LVOT/AV VTI ratio: 0.57  AORTA Ao Root diam: 2.50 cm Ao Asc diam:  2.60 cm MITRAL VALVE MV Area (PHT): 2.56 cm     SHUNTS MV Area VTI:   2.47 cm     Systemic VTI:  0.19 m MV Peak grad:  6.9 mmHg     Systemic Diam: 1.80 cm MV Mean grad:  3.0 mmHg MV Vmax:       1.31 m/s MV Vmean:      85.2 cm/s MV Decel Time: 296 msec MV E velocity: 92.40 cm/s MV A velocity: 121.00 cm/s MV E/A ratio:  0.76 Kirk Ruths MD Electronically signed by Kirk Ruths MD Signature Date/Time: 10/02/2021/11:33:52 AM    Final     Cardiac Studies     Patient Profile     76 y.o. female with PMH of CAD, seizure, PAD, carotid artery disease, HTN, HLD, bipolar and COPD presented with SOB and found to have elevated troponin  Assessment & Plan    1.Elevated tropnin - serial trop 10-->145-->304--> 328-->351             - occurred in the setting of COPD exacerbation/URI/PNA, uncontrolled BP and known severe distal vessel disease - presented with SOB, no chest pain - Cath 2015 at [facility] - p50, m80, d90; D1 p60, d80; mLCx 40, mOM1 80, RCA - p20, m70, d20, EF 65. Medical management recommended at the time - echo LVEF 70-75%, no WMAs, grade I dd, normal RV  - suspect demand ischemia in setting of known disease. Advanced age, dementia would treat medicaly 48 hrs total of heparin. No plans for cath at this time.   -this AM marks 48 hrs of heparin, will d/c   2.HTN - remain elevated, she is on norvasc 10, losartan 50, lopressor '50mg'$  bid, aldactone 25. Increase losartan to '100mg'$  daily   3. COPD exacerbation/pneumonia - per primary team   No further cardiac testing planned, we will sign off inpatient care.   For questions or updates, please contact [place] Please consult [email or web address] for contact info under        Signed, Carlyle Dolly, MD  10/03/2021, 7:25 AM

## 2021-10-03 NOTE — Plan of Care (Signed)
  Problem: Clinical Measurements: Goal: Respiratory complications will improve Outcome: Progressing Goal: Cardiovascular complication will be avoided Outcome: Progressing   Problem: Activity: Goal: Risk for activity intolerance will decrease Outcome: Progressing   Problem: Nutrition: Goal: Adequate nutrition will be maintained Outcome: Progressing   Problem: Coping: Goal: Level of anxiety will decrease Outcome: Progressing   Problem: Elimination: Goal: Will not experience complications related to urinary retention Outcome: Progressing   Problem: Pain Managment: Goal: General experience of comfort will improve Outcome: Progressing   Problem: Safety: Goal: Ability to remain free from injury will improve Outcome: Progressing   

## 2021-10-03 NOTE — Progress Notes (Addendum)
PROGRESS NOTE    Kathryn Lewis  OQH:[id number] DOB: 1945-12-01 DOA: 09/30/2021 PCP: Martinique, Betty G, MD   Brief Narrative: 76 year old with past medical history significant for dementia type 2 diabetes, hyperlipidemia, hypertension, CKD stage IIIb, CAD, peripheral artery disease, prior CVA, seizure disorder, OSA, COPD former smoker quit 2015 who presents complaining of productive cough.  She is a poor historian due to history of dementia.  CTA chest was negative for PE, lungs clear no pleural effusion.  She was found to have elevation of troponin which peaked to 304.  Cardiology was consulted and recommendation was to start heparin drip and blood pressure control.  Patient was found to have  blood pressure 206/64.    Assessment & Plan:   Principal Problem:   Elevated troponin Active Problems:   Seizure disorder as sequela of cerebrovascular accident ([place])   CAD (coronary artery disease)   Diabetes mellitus type 2 with neurological manifestations ([place])   Type II diabetes mellitus with neurological manifestations ([place])   CKD (chronic kidney disease) stage 3, GFR 30-59 ml/min (HCC)   1-Elevation of troponin: Cardiology following.  Thought to be secondary to demand ischemia in setting Bronchitis, COPD.  Received  Heparin gtt for 48 hours.  Medical management.   Acute  COPD exacerbation:  She has cough, SOB.  Bronchitis Acute resp failure ruled out  -Continue with Antibiotics.  -Nebulizer.  -Continue with  brovana, Pulmicort.  -flutter valve, guaifenesin.  -Continue with  prednisone -speech evaluation.  -Hypertonic saline Nebulizer.   Hypertension uncontrolled Continue with Norvasc and metoprolol. Cozaar increase to 100 mg today.  Might need hydralazine.   Diabetes type 2 with hyperglycemia Sliding scale insulin, singling 15 units  Hyperlipidemia: Continue with Crestor  Chronic diastolic heart failure: Continue with the spironolactone   Seizure disorder: Continue  with Dilantin Lamictal.  Dementia: Delirium precaution B12: 441.  Chronic hyponatremia/chronic CKD 3B: Monitor  Estimated body mass index is 24.1 kg/m as calculated from the following:   Height as of this encounter: '5\' 3"'$  (1.6 m).   Weight as of this encounter: 61.7 kg.   DVT prophylaxis: Heparin drip Code Status: DNR, discussed with daughter  Family Communication: Daughter Disposition Plan:  Status is: Inpatient Remains inpatient appropriate because: management of COPD    Consultants:  Cardiology   Procedures:    Antimicrobials:    Subjective: She is sleepy, still coughing , she wants to rest    Objective: Vitals:   10/03/21 0449 10/03/21 0800 10/03/21 0810 10/03/21 1300  BP: (!) 197/84 (!) 162/104  (!) 160/81  Pulse: 82 (!) 102 88 93  Resp: '19 18 17 16  '$ Temp: 97.9 F (36.6 C) 98 F (36.7 C)  98.2 F (36.8 C)  TempSrc: Oral Oral  Oral  SpO2: 99% 97% 99% 92%  Weight:      Height:        Intake/Output Summary (Last 24 hours) at 10/03/2021 1417 Last data filed at 10/03/2021 5465 Gross per 24 hour  Intake 924.02 ml  Output 3150 ml  Net -2225.98 ml    Filed Weights   09/30/21 1659  Weight: 61.7 kg    Examination:  General exam: NAD Respiratory system: BL [place] Cardiovascular system:S 1, S 2 RRR Gastrointestinal system: BS present, soft, nt Central nervous system: alert   Data Reviewed: I have personally reviewed following labs and imaging studies  CBC: Recent Labs  Lab 09/30/21 1811 09/30/21 1933 10/01/21 0736 10/03/21 0234  WBC 11.8*  --  13.0* 12.7*  HGB 10.7* 11.6* 10.8* 9.6*  HCT 32.0* 34.0* 32.6* 28.3*  MCV 86.0  --  84.7 85.0  PLT 215  --  241 625    Basic Metabolic Panel: Recent Labs  Lab 09/30/21 1811 09/30/21 1933 10/01/21 0736 10/02/21 0512 10/03/21 0234  NA 129* 133* 129* 130* 132*  K 4.2 4.4 4.4 4.3 4.4  CL 97*  --  98 99 102  CO2 25  --  22 19* 19*  GLUCOSE 108*  --  182* 206* 106*  BUN 17  --  '18 18 20   '$ CREATININE 1.16*  --  1.17* 0.97 0.97  CALCIUM 10.4*  --  10.2 9.1 9.2  MG  --   --   --  2.0  --   PHOS  --   --   --  3.1  --     GFR: Estimated Creatinine Clearance: 41.5 mL/min (by C-G formula based on SCr of 0.97 mg/dL). Liver Function Tests: Recent Labs  Lab 09/30/21 1811  AST 15  ALT 11  ALKPHOS 82  BILITOT 0.4  PROT 9.7*  ALBUMIN 4.1    No results for input(s): "LIPASE", "AMYLASE" in the last 168 hours. No results for input(s): "AMMONIA" in the last 168 hours. Coagulation Profile: No results for input(s): "INR", "PROTIME" in the last 168 hours. Cardiac Enzymes: No results for input(s): "CKTOTAL", "CKMB", "CKMBINDEX", "TROPONINI" in the last 168 hours. BNP (last 3 results) No results for input(s): "PROBNP" in the last 8760 hours. HbA1C: Recent Labs    10/02/21 0512  HGBA1C 6.8*    CBG: Recent Labs  Lab 10/03/21 0016 10/03/21 0144 10/03/21 0445 10/03/21 0822 10/03/21 1157  GLUCAP 168* 111* 111* 92 162*    Lipid Profile: Recent Labs    10/02/21 0512  CHOL 123  HDL 49  LDLCALC 61  TRIG 66  CHOLHDL 2.5    Thyroid Function Tests: No results for input(s): "TSH", "T4TOTAL", "FREET4", "T3FREE", "THYROIDAB" in the last 72 hours. Anemia Panel: Recent Labs    10/02/21 0512 10/02/21 1140  VITAMINB12  --  441  FOLATE 7.5  --   FERRITIN 203  --   TIBC  --  220*  IRON  --  49  RETICCTPCT 1.8  --     Sepsis Labs: Recent Labs  Lab 10/02/21 0512  PROCALCITON 0.15     Recent Results (from the past 240 hour(s))  Resp Panel by RT-PCR (Flu A&B, Covid) Anterior Nasal Swab     Status: None   Collection Time: 09/30/21  6:11 PM   Specimen: Anterior Nasal Swab  Result Value Ref Range Status   SARS Coronavirus 2 by RT PCR NEGATIVE NEGATIVE Final    Comment: (NOTE) SARS-CoV-2 target nucleic acids are NOT DETECTED.  The SARS-CoV-2 RNA is generally detectable in upper respiratory specimens during the acute phase of infection. The  lowest concentration of SARS-CoV-2 viral copies this assay can detect is 138 copies/mL. A negative result does not preclude SARS-Cov-2 infection and should not be used as the sole basis for treatment or other patient management decisions. A negative result may occur with  improper specimen collection/handling, submission of specimen other than nasopharyngeal swab, presence of viral mutation(s) within the areas targeted by this assay, and inadequate number of viral copies(<138 copies/mL). A negative result must be combined with clinical observations, patient history, and epidemiological information. The expected result is Negative.  Fact Sheet for Patients:  EntrepreneurPulse.com.au  Fact Sheet for Healthcare Providers:  IncredibleEmployment.be  This test  is no t yet approved or cleared by the [place] and  has been authorized for detection and/or diagnosis of SARS-CoV-2 by FDA under an Emergency Use Authorization (EUA). This EUA will remain  in effect (meaning this test can be used) for the duration of the COVID-19 declaration under Section 564(b)(1) of the Act, 21 U.S.C.section 360bbb-3(b)(1), unless the authorization is terminated  or revoked sooner.       Influenza A by PCR NEGATIVE NEGATIVE Final   Influenza B by PCR NEGATIVE NEGATIVE Final    Comment: (NOTE) The Xpert Xpress SARS-CoV-2/FLU/RSV plus assay is intended as an aid in the diagnosis of influenza from Nasopharyngeal swab specimens and should not be used as a sole basis for treatment. Nasal washings and aspirates are unacceptable for Xpert Xpress SARS-CoV-2/FLU/RSV testing.  Fact Sheet for Patients: EntrepreneurPulse.com.au  Fact Sheet for Healthcare Providers: IncredibleEmployment.be  This test is not yet approved or cleared by the [place] FDA and has been authorized for detection and/or diagnosis of SARS-CoV-2 by FDA under  an Emergency Use Authorization (EUA). This EUA will remain in effect (meaning this test can be used) for the duration of the COVID-19 declaration under Section 564(b)(1) of the Act, 21 U.S.C. section 360bbb-3(b)(1), unless the authorization is terminated or revoked.  Performed at [facility], [address], [place], [place] [address]          Radiology Studies: ECHOCARDIOGRAM COMPLETE  Result Date: 10/02/2021    ECHOCARDIOGRAM REPORT   Patient Name:   Kathryn Lewis Date of Exam: 10/02/2021 Medical Rec #:  [id number]        Height:       63.0 in Accession #:    [id number]       Weight:       136.0 lb Date of Birth:  [birth year]        BSA:          1.641 m Patient Age:    31 years         BP:           199/78 mmHg Patient Gender: F                HR:           96 bpm. Exam Location:  Inpatient Procedure: 2D Echo, Cardiac Doppler and Color Doppler Indications:    Elevated troponin  History:        Patient has prior history of Echocardiogram examinations, most                 recent 06/29/2018. Previous Myocardial Infarction and CAD, COPD,                 Signs/Symptoms:Shortness of Breath; Risk Factors:Diabetes and                 Hypertension. Hx CVA. PAD.  Sonographer:    Clayton Lefort RDCS (AE) Referring Phys: [id number] [place]  1. Left ventricular ejection fraction, by estimation, is 70 to 75%. The left ventricle has hyperdynamic function. The left ventricle has no regional wall motion abnormalities. There is mild left ventricular hypertrophy of the basal-septal segment. Left ventricular diastolic parameters are consistent with Grade I diastolic dysfunction (impaired relaxation). Elevated left atrial pressure.  2. Right ventricular systolic function is normal. The right ventricular size is normal.  3. The mitral valve is normal in structure. No evidence of mitral valve regurgitation. No evidence of mitral stenosis.  4.  The aortic valve was not well visualized.  Aortic valve regurgitation is not visualized. No aortic stenosis is present.  5. The inferior vena cava is normal in size with greater than 50% respiratory variability, suggesting right atrial pressure of 3 mmHg. FINDINGS  Left Ventricle: Left ventricular ejection fraction, by estimation, is 70 to 75%. The left ventricle has hyperdynamic function. The left ventricle has no regional wall motion abnormalities. The left ventricular internal cavity size was normal in size. There is mild left ventricular hypertrophy of the basal-septal segment. Left ventricular diastolic parameters are consistent with Grade I diastolic dysfunction (impaired relaxation). Elevated left atrial pressure. Right Ventricle: The right ventricular size is normal. Right ventricular systolic function is normal. Left Atrium: Left atrial size was normal in size. Right Atrium: Right atrial size was normal in size. Pericardium: There is no evidence of pericardial effusion. Mitral Valve: The mitral valve is normal in structure. No evidence of mitral valve regurgitation. No evidence of mitral valve stenosis. MV peak gradient, 6.9 mmHg. The mean mitral valve gradient is 3.0 mmHg. Tricuspid Valve: The tricuspid valve is normal in structure. Tricuspid valve regurgitation is trivial. No evidence of tricuspid stenosis. Aortic Valve: The aortic valve was not well visualized. Aortic valve regurgitation is not visualized. No aortic stenosis is present. Aortic valve mean gradient measures 6.0 mmHg. Aortic valve peak gradient measures 11.6 mmHg. Aortic valve area, by VTI measures 1.45 cm. Pulmonic Valve: The pulmonic valve was not well visualized. Pulmonic valve regurgitation is not visualized. No evidence of pulmonic stenosis. Aorta: The aortic root is normal in size and structure. Venous: The inferior vena cava is normal in size with greater than 50% respiratory variability, suggesting right atrial pressure of 3 mmHg. IAS/Shunts: No atrial level shunt detected  by color flow Doppler.  LEFT VENTRICLE PLAX 2D LVIDd:         3.10 cm   Diastology LVIDs:         2.20 cm   LV e' medial:    4.46 cm/s LV PW:         1.20 cm   LV E/e' medial:  20.7 LV IVS:        1.70 cm   LV e' lateral:   5.44 cm/s LVOT diam:     1.80 cm   LV E/e' lateral: 17.0 LV SV:         47 LV SV Index:   29 LVOT Area:     2.54 cm  RIGHT VENTRICLE RV Basal diam:  2.50 cm RV S prime:     14.70 cm/s TAPSE (M-mode): 2.5 cm LEFT ATRIUM             Index        RIGHT ATRIUM           Index LA diam:        2.30 cm 1.40 cm/m   RA Area:     10.20 cm LA Vol (A2C):   30.1 ml 18.34 ml/m  RA Volume:   17.80 ml  10.84 ml/m LA Vol (A4C):   31.2 ml 19.01 ml/m LA Biplane Vol: 33.2 ml 20.23 ml/m  AORTIC VALVE AV Area (Vmax):    1.63 cm AV Area (Vmean):   1.60 cm AV Area (VTI):     1.45 cm AV Vmax:           170.00 cm/s AV Vmean:          114.000 cm/s AV VTI:  0.327 m AV Peak Grad:      11.6 mmHg AV Mean Grad:      6.0 mmHg LVOT Vmax:         109.00 cm/s LVOT Vmean:        71.900 cm/s LVOT VTI:          0.186 m LVOT/AV VTI ratio: 0.57  AORTA Ao Root diam: 2.50 cm Ao Asc diam:  2.60 cm MITRAL VALVE MV Area (PHT): 2.56 cm     SHUNTS MV Area VTI:   2.47 cm     Systemic VTI:  0.19 m MV Peak grad:  6.9 mmHg     Systemic Diam: 1.80 cm MV Mean grad:  3.0 mmHg MV Vmax:       1.31 m/s MV Vmean:      85.2 cm/s MV Decel Time: 296 msec MV E velocity: 92.40 cm/s MV A velocity: 121.00 cm/s MV E/A ratio:  0.76 Kirk Ruths MD Electronically signed by Kirk Ruths MD Signature Date/Time: 10/02/2021/11:33:52 AM    Final         Scheduled Meds:  amLODipine  10 mg Oral Daily   arformoterol  15 mcg Nebulization BID   aspirin EC  81 mg Oral Daily   budesonide (PULMICORT) nebulizer solution  0.25 mg Nebulization BID   clopidogrel  75 mg Oral Daily   ezetimibe  10 mg Oral Daily   guaiFENesin  1,200 mg Oral BID   heparin  5,000 Units Subcutaneous Q8H   insulin aspart  0-9 Units Subcutaneous Q4H   insulin  glargine-yfgn  15 Units Subcutaneous QHS   ipratropium-albuterol  3 mL Nebulization Q6H   isosorbide mononitrate  30 mg Oral Daily   lamoTRIgine  300 mg Oral BID   losartan  100 mg Oral Daily   metoprolol tartrate  50 mg Oral BID   phenytoin  30 mg Oral QHS   phenytoin  300 mg Oral Daily   predniSONE  40 mg Oral Q breakfast   rosuvastatin  40 mg Oral Daily   sodium chloride HYPERTONIC  4 mL Nebulization Daily   spironolactone  25 mg Oral Daily   umeclidinium bromide  1 puff Inhalation Daily   Continuous Infusions:  azithromycin 500 mg (10/03/21 0039)   cefTRIAXone (ROCEPHIN)  IV 2 g (10/02/21 2318)     LOS: 2 days    Time spent: 35 minutes   Sherrica Niehaus A Mitzy Naron, MD [facility]   If 7PM-7AM, please contact night-coverage [email or web address]  10/03/2021, 2:17 PM

## 2021-10-04 ENCOUNTER — Inpatient Hospital Stay (HOSPITAL_COMMUNITY): Payer: HMO

## 2021-10-04 DIAGNOSIS — R778 Other specified abnormalities of plasma proteins: Secondary | ICD-10-CM | POA: Diagnosis not present

## 2021-10-04 LAB — GLUCOSE, CAPILLARY
Glucose-Capillary: 103 mg/dL — ABNORMAL HIGH (ref 70–99)
Glucose-Capillary: 107 mg/dL — ABNORMAL HIGH (ref 70–99)
Glucose-Capillary: 145 mg/dL — ABNORMAL HIGH (ref 70–99)
Glucose-Capillary: 162 mg/dL — ABNORMAL HIGH (ref 70–99)
Glucose-Capillary: 182 mg/dL — ABNORMAL HIGH (ref 70–99)
Glucose-Capillary: 276 mg/dL — ABNORMAL HIGH (ref 70–99)

## 2021-10-04 LAB — BASIC METABOLIC PANEL
Anion gap: 15 (ref 5–15)
BUN: 19 mg/dL (ref 8–23)
CO2: 16 mmol/L — ABNORMAL LOW (ref 22–32)
Calcium: 9.7 mg/dL (ref 8.9–10.3)
Chloride: 102 mmol/L (ref 98–111)
Creatinine, Ser: 1.04 mg/dL — ABNORMAL HIGH (ref 0.44–1.00)
GFR, Estimated: 56 mL/min — ABNORMAL LOW (ref >60)
Glucose, Bld: 112 mg/dL — ABNORMAL HIGH (ref 70–99)
Potassium: 3.8 mmol/L (ref 3.5–5.1)
Sodium: 133 mmol/L — ABNORMAL LOW (ref 135–145)

## 2021-10-04 LAB — BLOOD GAS, ARTERIAL
Acid-base deficit: 3.4 mmol/L — ABNORMAL HIGH (ref 0.0–2.0)
Bicarbonate: 20.3 mmol/L (ref 20.0–28.0)
Drawn by: 44135
O2 Saturation: 99.3 %
Patient temperature: 37
pCO2 arterial: 32 mmHg (ref 32–48)
pH, Arterial: 7.41 (ref 7.35–7.45)
pO2, Arterial: 120 mmHg — ABNORMAL HIGH (ref 83–108)

## 2021-10-04 LAB — CBC
HCT: 30 % — ABNORMAL LOW (ref 36.0–46.0)
Hemoglobin: 10.2 g/dL — ABNORMAL LOW (ref 12.0–15.0)
MCH: 29.1 pg (ref 26.0–34.0)
MCHC: 34 g/dL (ref 30.0–36.0)
MCV: 85.5 fL (ref 80.0–100.0)
Platelets: 288 10*3/uL (ref 150–400)
RBC: 3.51 MIL/uL — ABNORMAL LOW (ref 3.87–5.11)
RDW: 14.6 % (ref 11.5–15.5)
WBC: 14.9 10*3/uL — ABNORMAL HIGH (ref 4.0–10.5)
nRBC: 0 % (ref 0.0–0.2)

## 2021-10-04 MED ORDER — LORAZEPAM 2 MG/ML IJ SOLN
0.5000 mg | Freq: Four times a day (QID) | INTRAMUSCULAR | Status: AC | PRN
  Administered 2021-10-04: 0.5 mg via INTRAVENOUS
  Filled 2021-10-04: qty 1

## 2021-10-04 MED ORDER — LACTATED RINGERS IV SOLN
1000.0000 mL | INTRAVENOUS | Status: DC
  Administered 2021-10-04 – 2021-10-05 (×2): 1000 mL via INTRAVENOUS

## 2021-10-04 MED ORDER — HYDRALAZINE HCL 25 MG PO TABS
25.0000 mg | ORAL_TABLET | Freq: Three times a day (TID) | ORAL | Status: DC
  Administered 2021-10-04 – 2021-10-09 (×16): 25 mg via ORAL
  Filled 2021-10-04 (×17): qty 1

## 2021-10-04 MED ORDER — METHYLPREDNISOLONE SODIUM SUCC 40 MG IJ SOLR
40.0000 mg | Freq: Two times a day (BID) | INTRAMUSCULAR | Status: DC
  Administered 2021-10-05 – 2021-10-07 (×6): 40 mg via INTRAVENOUS
  Filled 2021-10-04 (×6): qty 1

## 2021-10-04 MED ORDER — METHYLPREDNISOLONE SODIUM SUCC 40 MG IJ SOLR: 40.0000 mg | Freq: Two times a day (BID) | INTRAMUSCULAR | Status: DC

## 2021-10-04 MED ORDER — SODIUM CHLORIDE 3 % IN NEBU
4.0000 mL | INHALATION_SOLUTION | Freq: Two times a day (BID) | RESPIRATORY_TRACT | Status: AC
  Administered 2021-10-04 – 2021-10-06 (×6): 4 mL via RESPIRATORY_TRACT
  Filled 2021-10-04 (×5): qty 4

## 2021-10-04 MED ORDER — SODIUM CHLORIDE 0.9 % IV SOLN
500.0000 mg | INTRAVENOUS | Status: AC
  Administered 2021-10-04 – 2021-10-05 (×2): 500 mg via INTRAVENOUS
  Filled 2021-10-04 (×2): qty 5

## 2021-10-04 NOTE — Progress Notes (Signed)
PROGRESS NOTE    Kathryn Lewis  EGB:[id number] DOB: May 27, 1945 DOA: 09/30/2021 PCP: [place], Betty G, MD   Brief Narrative: 76 year old with past medical history significant for dementia type 2 diabetes, hyperlipidemia, hypertension, CKD stage IIIb, CAD, peripheral artery disease, prior CVA, seizure disorder, OSA, COPD former smoker quit 2015 who presents complaining of productive cough.  She is a poor historian due to history of dementia.  CTA chest was negative for PE, lungs clear no pleural effusion.  She was found to have elevation of troponin which peaked to 304.  Cardiology was consulted and recommendation was to start heparin drip and blood pressure control.  Patient was found to have  blood pressure 206/64.    Assessment & Plan:   Principal Problem:   Elevated troponin Active Problems:   Seizure disorder as sequela of cerebrovascular accident ([place])   CAD (coronary artery disease)   Diabetes mellitus type 2 with neurological manifestations ([place])   Type II diabetes mellitus with neurological manifestations ([place])   CKD (chronic kidney disease) stage 3, GFR 30-59 ml/min (HCC)   1-Elevation of troponin: Cardiology following.  Thought to be secondary to demand ischemia in setting Bronchitis, COPD.  Received  Heparin gtt for 48 hours.  Medical management.   Acute  COPD exacerbation:  She has cough, SOB.  Bronchitis Acute resp failure ruled out  -Continue with Antibiotics.  -Nebulizer.  -Continue with  brovana, Pulmicort.  -flutter valve, guaifenesin.  -having more wheezing and SOB this am. Check chest x ray. Change prednisone to IV solumedrol.  -Hypertonic saline Nebulizer. BID She refuse nebulizer this am.  Chest PT>   Hypertension uncontrolled Continue with Norvasc and metoprolol. Cozaar increase to 100 mg today.  Start oral Hydralazine.   Metabolic acidosis;  ABG PH normal.  Plan to give IV hydration.   Diabetes type 2 with hyperglycemia Sliding scale insulin,  singling 15 units  Hyperlipidemia: Continue with Crestor  Chronic diastolic heart failure: Continue with the spironolactone   Seizure disorder: Continue with Dilantin Lamictal.  Dementia: Delirium precaution B12: 441.  Chronic hyponatremia/chronic CKD 3B: Monitor  Estimated body mass index is 24.1 kg/m as calculated from the following:   Height as of this encounter: '5\' 3"'$  (1.6 m).   Weight as of this encounter: 61.7 kg.   DVT prophylaxis: Heparin drip Code Status: DNR, discussed with daughter  Family Communication: Daughter Disposition Plan:  Status is: Inpatient Remains inpatient appropriate because: management of COPD    Consultants:  Cardiology   Procedures:    Antimicrobials:    Subjective: She is more SOB this am and more confuse. She decline nebulizer earlier.  She is more sleepy   Objective: Vitals:   10/04/21 1042 10/04/21 1049 10/04/21 1113 10/04/21 1204  BP:    (!) 130/98  Pulse:    80  Resp:    18  Temp:    97.8 F (36.6 C)  TempSrc:    Oral  SpO2: 100% 97% 97% 92%  Weight:      Height:        Intake/Output Summary (Last 24 hours) at 10/04/2021 1441 Last data filed at 10/04/2021 0447 Gross per 24 hour  Intake 720 ml  Output 1100 ml  Net -380 ml    Filed Weights   09/30/21 1659  Weight: 61.7 kg    Examination:  General exam: NAD Respiratory system: BL [place] Cardiovascular system: S 1, S 2 RRR Gastrointestinal system: BS present, soft nt nt Central nervous system: Alert  Data Reviewed: I have personally reviewed following labs and imaging studies  CBC: Recent Labs  Lab 09/30/21 1811 09/30/21 1933 10/01/21 0736 10/03/21 0234 10/04/21 0817  WBC 11.8*  --  13.0* 12.7* 14.9*  HGB 10.7* 11.6* 10.8* 9.6* 10.2*  HCT 32.0* 34.0* 32.6* 28.3* 30.0*  MCV 86.0  --  84.7 85.0 85.5  PLT 215  --  241 240 884    Basic Metabolic Panel: Recent Labs  Lab 09/30/21 1811 09/30/21 1933 10/01/21 0736 10/02/21 0512  10/03/21 0234 10/04/21 0817  NA 129* 133* 129* 130* 132* 133*  K 4.2 4.4 4.4 4.3 4.4 3.8  CL 97*  --  98 99 102 102  CO2 25  --  22 19* 19* 16*  GLUCOSE 108*  --  182* 206* 106* 112*  BUN 17  --  '18 18 20 19  '$ CREATININE 1.16*  --  1.17* 0.97 0.97 1.04*  CALCIUM 10.4*  --  10.2 9.1 9.2 9.7  MG  --   --   --  2.0  --   --   PHOS  --   --   --  3.1  --   --     GFR: Estimated Creatinine Clearance: 38.7 mL/min (A) (by C-Lewis formula based on SCr of 1.04 mg/dL (H)). Liver Function Tests: Recent Labs  Lab 09/30/21 1811  AST 15  ALT 11  ALKPHOS 82  BILITOT 0.4  PROT 9.7*  ALBUMIN 4.1    No results for input(s): "LIPASE", "AMYLASE" in the last 168 hours. No results for input(s): "AMMONIA" in the last 168 hours. Coagulation Profile: No results for input(s): "INR", "PROTIME" in the last 168 hours. Cardiac Enzymes: No results for input(s): "CKTOTAL", "CKMB", "CKMBINDEX", "TROPONINI" in the last 168 hours. BNP (last 3 results) No results for input(s): "PROBNP" in the last 8760 hours. HbA1C: Recent Labs    10/02/21 0512  HGBA1C 6.8*    CBG: Recent Labs  Lab 10/03/21 2010 10/04/21 0031 10/04/21 0445 10/04/21 0842 10/04/21 1157  GLUCAP 188* 182* 103* 107* 145*    Lipid Profile: Recent Labs    10/02/21 0512  CHOL 123  HDL 49  LDLCALC 61  TRIG 66  CHOLHDL 2.5    Thyroid Function Tests: No results for input(s): "TSH", "T4TOTAL", "FREET4", "T3FREE", "THYROIDAB" in the last 72 hours. Anemia Panel: Recent Labs    10/02/21 0512 10/02/21 1140  VITAMINB12  --  441  FOLATE 7.5  --   FERRITIN 203  --   TIBC  --  220*  IRON  --  49  RETICCTPCT 1.8  --     Sepsis Labs: Recent Labs  Lab 10/02/21 0512  PROCALCITON 0.15     Recent Results (from the past 240 hour(s))  Resp Panel by RT-PCR (Flu A&B, Covid) Anterior Nasal Swab     Status: None   Collection Time: 09/30/21  6:11 PM   Specimen: Anterior Nasal Swab  Result Value Ref Range Status   SARS Coronavirus  2 by RT PCR NEGATIVE NEGATIVE Final    Comment: (NOTE) SARS-CoV-2 target nucleic acids are NOT DETECTED.  The SARS-CoV-2 RNA is generally detectable in upper respiratory specimens during the acute phase of infection. The lowest concentration of SARS-CoV-2 viral copies this assay can detect is 138 copies/mL. A negative result does not preclude SARS-Cov-2 infection and should not be used as the sole basis for treatment or other patient management decisions. A negative result may occur with  improper specimen collection/handling, submission of specimen  other than nasopharyngeal swab, presence of viral mutation(s) within the areas targeted by this assay, and inadequate number of viral copies(<138 copies/mL). A negative result must be combined with clinical observations, patient history, and epidemiological information. The expected result is Negative.  Fact Sheet for Patients:  EntrepreneurPulse.com.au  Fact Sheet for Healthcare Providers:  IncredibleEmployment.be  This test is no t yet approved or cleared by the [place] FDA and  has been authorized for detection and/or diagnosis of SARS-CoV-2 by FDA under an Emergency Use Authorization (EUA). This EUA will remain  in effect (meaning this test can be used) for the duration of the COVID-19 declaration under Section 564(b)(1) of the Act, 21 U.S.C.section 360bbb-3(b)(1), unless the authorization is terminated  or revoked sooner.       Influenza A by PCR NEGATIVE NEGATIVE Final   Influenza B by PCR NEGATIVE NEGATIVE Final    Comment: (NOTE) The Xpert Xpress SARS-CoV-2/FLU/RSV plus assay is intended as an aid in the diagnosis of influenza from Nasopharyngeal swab specimens and should not be used as a sole basis for treatment. Nasal washings and aspirates are unacceptable for Xpert Xpress SARS-CoV-2/FLU/RSV testing.  Fact Sheet for Patients: EntrepreneurPulse.com.au  Fact  Sheet for Healthcare Providers: IncredibleEmployment.be  This test is not yet approved or cleared by the [place] FDA and has been authorized for detection and/or diagnosis of SARS-CoV-2 by FDA under an Emergency Use Authorization (EUA). This EUA will remain in effect (meaning this test can be used) for the duration of the COVID-19 declaration under Section 564(b)(1) of the Act, 21 U.S.C. section 360bbb-3(b)(1), unless the authorization is terminated or revoked.  Performed at [facility], [address], [place],  [address]          Radiology Studies: DG CHEST PORT 1 VIEW  Result Date: 10/04/2021 CLINICAL DATA:  Shortness of breath EXAM: PORTABLE CHEST 1 VIEW COMPARISON:  09/30/2021 FINDINGS: Stable cardiomediastinal contours. Aortic atherosclerosis. Slightly low lung volumes with mild bibasilar atelectasis. No appreciable pleural fluid collection. No pneumothorax. IMPRESSION: Low lung volumes with mild bibasilar atelectasis. Electronically Signed   By: Davina Poke D.O.   On: 10/04/2021 11:37        Scheduled Meds:  amLODipine  10 mg Oral Daily   arformoterol  15 mcg Nebulization BID   aspirin EC  81 mg Oral Daily   budesonide (PULMICORT) nebulizer solution  0.25 mg Nebulization BID   clopidogrel  75 mg Oral Daily   ezetimibe  10 mg Oral Daily   guaiFENesin  1,200 mg Oral BID   heparin  5,000 Units Subcutaneous Q8H   hydrALAZINE  25 mg Oral Q8H   insulin aspart  0-9 Units Subcutaneous Q4H   insulin glargine-yfgn  15 Units Subcutaneous QHS   ipratropium-albuterol  3 mL Nebulization Q6H   isosorbide mononitrate  30 mg Oral Daily   lamoTRIgine  300 mg Oral BID   losartan  100 mg Oral Daily   methylPREDNISolone (SOLU-MEDROL) injection  40 mg Intravenous Q12H   metoprolol tartrate  50 mg Oral BID   phenytoin  30 mg Oral QHS   phenytoin  300 mg Oral Daily   rosuvastatin  40 mg Oral Daily   sodium chloride HYPERTONIC   4 mL Nebulization BID   spironolactone  25 mg Oral Daily   umeclidinium bromide  1 puff Inhalation Daily   Continuous Infusions:  azithromycin 500 mg (10/04/21 1158)   cefTRIAXone (ROCEPHIN)  IV 2 Lewis (10/03/21 2032)   lactated ringers 1,000  mL (10/04/21 1154)     LOS: 3 days    Time spent: 35 minutes   Haddon Fyfe A Bryam Taborda, MD [facility]   If 7PM-7AM, please contact night-coverage [email or web address]  10/04/2021, 2:41 PM

## 2021-10-04 NOTE — Plan of Care (Signed)
  Problem: Clinical Measurements: Goal: Diagnostic test results will improve Outcome: Progressing   Problem: Activity: Goal: Risk for activity intolerance will decrease Outcome: Progressing   Problem: Nutrition: Goal: Adequate nutrition will be maintained Outcome: Progressing   Problem: Coping: Goal: Level of anxiety will decrease Outcome: Progressing   Problem: Safety: Goal: Ability to remain free from injury will improve Outcome: Progressing   Problem: Skin Integrity: Goal: Risk for impaired skin integrity will decrease Outcome: Progressing   Problem: Tissue Perfusion: Goal: Adequacy of tissue perfusion will improve Outcome: Progressing   Problem: Clinical Measurements: Goal: Respiratory complications will improve Outcome: Progressing   Problem: Clinical Measurements: Goal: Cardiovascular complication will be avoided Outcome: Progressing

## 2021-10-04 NOTE — Progress Notes (Signed)
Mobility Specialist - Progress Note   10/04/21 1402  Mobility  Activity Contraindicated/medical hold   Pt is currently very confused and not suitable for mobility at this time. Will follow up.  Larey Seat

## 2021-10-05 ENCOUNTER — Inpatient Hospital Stay (HOSPITAL_COMMUNITY): Payer: HMO

## 2021-10-05 DIAGNOSIS — R778 Other specified abnormalities of plasma proteins: Secondary | ICD-10-CM | POA: Diagnosis not present

## 2021-10-05 LAB — BASIC METABOLIC PANEL
Anion gap: 9 (ref 5–15)
BUN: 17 mg/dL (ref 8–23)
CO2: 18 mmol/L — ABNORMAL LOW (ref 22–32)
Calcium: 9.3 mg/dL (ref 8.9–10.3)
Chloride: 106 mmol/L (ref 98–111)
Creatinine, Ser: 1.03 mg/dL — ABNORMAL HIGH (ref 0.44–1.00)
GFR, Estimated: 57 mL/min — ABNORMAL LOW (ref >60)
Glucose, Bld: 312 mg/dL — ABNORMAL HIGH (ref 70–99)
Potassium: 4.5 mmol/L (ref 3.5–5.1)
Sodium: 133 mmol/L — ABNORMAL LOW (ref 135–145)

## 2021-10-05 LAB — CBC
HCT: 30.2 % — ABNORMAL LOW (ref 36.0–46.0)
Hemoglobin: 10 g/dL — ABNORMAL LOW (ref 12.0–15.0)
MCH: 28.8 pg (ref 26.0–34.0)
MCHC: 33.1 g/dL (ref 30.0–36.0)
MCV: 87 fL (ref 80.0–100.0)
Platelets: 290 10*3/uL (ref 150–400)
RBC: 3.47 MIL/uL — ABNORMAL LOW (ref 3.87–5.11)
RDW: 14.7 % (ref 11.5–15.5)
WBC: 11.3 10*3/uL — ABNORMAL HIGH (ref 4.0–10.5)
nRBC: 0 % (ref 0.0–0.2)

## 2021-10-05 LAB — GLUCOSE, CAPILLARY
Glucose-Capillary: 166 mg/dL — ABNORMAL HIGH (ref 70–99)
Glucose-Capillary: 195 mg/dL — ABNORMAL HIGH (ref 70–99)
Glucose-Capillary: 198 mg/dL — ABNORMAL HIGH (ref 70–99)
Glucose-Capillary: 258 mg/dL — ABNORMAL HIGH (ref 70–99)
Glucose-Capillary: 312 mg/dL — ABNORMAL HIGH (ref 70–99)
Glucose-Capillary: 70 mg/dL (ref 70–99)
Glucose-Capillary: 97 mg/dL (ref 70–99)

## 2021-10-05 MED ORDER — ORAL CARE MOUTH RINSE: 15.0000 mL | OROMUCOSAL | Status: DC | PRN

## 2021-10-05 NOTE — Care Management Important Message (Signed)
Important Message  Patient Details  Name: Kathryn Lewis MRN: [id number] Date of Birth: 1945-06-09   Medicare Important Message Given:  Yes     [name] 10/05/2021, 10:11 AM

## 2021-10-05 NOTE — Progress Notes (Signed)
Speech Language Pathology Treatment: Dysphagia  Patient Details Name: Kathryn Lewis MRN: [id number] DOB: 05-17-1945 Today's Date: 10/05/2021 Time: 2505-3976 SLP Time Calculation (min) (ACUTE ONLY): 19 min  Assessment / Plan / Recommendation Clinical Impression  Pt seen for dysphagia and is demonstrating signs of aspiration. Prior session 9/8, she was coughing before po's and exhibited difficulty coordinating respiration and swallow. Today there was no baseline cough on arrival and she immediately coughed after first sip thin liquids and continued to demonstrate cough related to swallows for duration of session. Upper dentures donned and pt masticated cracker with intermittent coughing possibly due to trials water and able to clear oral cavity. MBS warranted to further assess swallow integrity scheduled for 12:00 today. Continue regular/thin until study.    HPI HPI: Kathryn Lewis is a 76 y.o. female with medical history significant for type 2 diabetes, COPD, hyperlipidemia, hypertension, CKD 3B, coronary artery disease, peripheral artery disease, CVA, seizure disorder, OSA, who initially presented to [facility] ED with complaints of persistent nonproductive cough. CTA chest was negative for pulmonary embolism and unequivocal for pneumonia.  COVID-19 negative.  Troponin up trended, peaked at 304.      SLP Plan  MBS      Recommendations for follow up therapy are one component of a multi-disciplinary discharge planning process, led by the attending physician.  Recommendations may be updated based on patient status, additional functional criteria and insurance authorization.    Recommendations  Diet recommendations: Regular;Thin liquid Liquids provided via: Straw;Cup Medication Administration: Whole meds with puree Supervision: Patient able to self feed Compensations: Minimize environmental distractions;Slow rate;Small sips/bites Postural Changes and/or Swallow Maneuvers: Seated upright  90 degrees                Oral Care Recommendations: Oral care BID Follow Up Recommendations:  (TBD) Assistance recommended at discharge: None SLP Visit Diagnosis: Dysphagia, unspecified (R13.10) Plan: MBS           Houston Siren  10/05/2021, 9:56 AM

## 2021-10-05 NOTE — Evaluation (Signed)
Physical Therapy Evaluation Patient Details Name: Kathryn Lewis MRN: [id number] DOB: Mar 15, 1945 Today's Date: 10/05/2021  History of Present Illness  Patient is a 76 y/o female who presents on 09/30/21 with SOB and cough. CTA chest was negative for pulmonary embolism and unequivocal for pneumonia. Admitted with Acute COPD exacerbation. Troponin up trended, peaked at 304. PMH includes CAD, PAD, CVA, MI, bipolar, CKD, OSA, depression, DM.  Clinical Impression  Patient presents with generalized weakness, impaired balance, baseline cognitive deficits, decreased activity tolerance and impaired mobility s/p above. Pt is from [facility] and reports being independent for ADLs at baseline and uses w/c to get to the dining hall for meals. Walks around familiar room without difficulty using RW. Pt with hx of visual deficits from prior CVA and cognitive deficits as well per daughter. Today, pt tolerated transfers and gait training with Min A and use of RW for support with max directional cues for RW use, safety and balance. Pt running into things on left side. Will likely do better in familiar environment. Will follow acutely to maximize independence and mobility prior to return home.       Recommendations for follow up therapy are one component of a multi-disciplinary discharge planning process, led by the attending physician.  Recommendations may be updated based on patient status, additional functional criteria and insurance authorization.  Follow Up Recommendations Home health PT      Assistance Recommended at Discharge Intermittent Supervision/Assistance  Patient can return home with the following  A little help with walking and/or transfers;A little help with bathing/dressing/bathroom;Direct supervision/assist for financial management;Direct supervision/assist for medications management;Assistance with cooking/housework    Equipment Recommendations None recommended by PT  Recommendations for  Other Services       Functional Status Assessment Patient has had a recent decline in their functional status and demonstrates the ability to make significant improvements in function in a reasonable and predictable amount of time.     Precautions / Restrictions Precautions Precautions: Fall Restrictions Weight Bearing Restrictions: No      Mobility  Bed Mobility Overal bed mobility: Needs Assistance Bed Mobility: Supine to Sit, Sit to Supine     Supine to sit: Min guard, HOB elevated Sit to supine: Min guard, HOB elevated   General bed mobility comments: increased time and effort but no physical assist needed. Use of rail. Able to scoot self up in bed.    Transfers Overall transfer level: Needs assistance Equipment used: Rolling walker (2 wheels) Transfers: Sit to/from Stand Sit to Stand: Min guard           General transfer comment: Min guard for safety. Stood from [employer], cues for hand placement/technique.    Ambulation/Gait Ambulation/Gait assistance: Min assist Gait Distance (Feet): 100 Feet Assistive device: Rolling walker (2 wheels) Gait Pattern/deviations: Step-through pattern, Decreased stride length, Trunk flexed Gait velocity: decreased Gait velocity interpretation: <1.31 ft/sec, indicative of household ambulator   General Gait Details: Slow, unsteady gait with cues for RW proximity/management and max directional cues due to difficulty seeing in left visual field running into things on left side. Cues for upright. Fatigues.  Stairs            Wheelchair Mobility    Modified Rankin (Stroke Patients Only)       Balance Overall balance assessment: Needs assistance Sitting-balance support: Feet supported, No upper extremity supported Sitting balance-Leahy Scale: Fair Sitting balance - Comments: Cannot reach down to feet or will fall over per report, hx of  dizziness from prior CVA.   Standing balance support: During functional activity,  Reliant on assistive device for balance, Bilateral upper extremity supported Standing balance-Leahy Scale: Poor                               Pertinent Vitals/Pain Pain Assessment Pain Assessment: Faces Faces Pain Scale: Hurts a little bit Pain Location: throat from coughing Pain Descriptors / Indicators: Sore Pain Intervention(s): Monitored during session    Home Living Family/patient expects to be discharged to:: Assisted living                 Home Equipment: Conservation officer, nature (2 wheels);BSC/3in1;Wheelchair - manual      Prior Function Prior Level of Function : Independent/Modified Independent             Mobility Comments: USes w/c to go down to dining hall using feet or arms or facility pushes her,w alks around room but has vision deficits. ADLs Comments: independent     Hand Dominance   Dominant Hand: Right    Extremity/Trunk Assessment   Upper Extremity Assessment Upper Extremity Assessment: Defer to OT evaluation    Lower Extremity Assessment Lower Extremity Assessment: Generalized weakness       Communication   Communication: No difficulties  Cognition Arousal/Alertness: Awake/alert Behavior During Therapy: WFL for tasks assessed/performed Overall Cognitive Status: History of cognitive impairments - at baseline                                 General Comments: Per daughter, pt is at cognitive baseline, "mixes up words sometimes" and has poor memory. Sundowns at baseline. Is sarcastic and jokes a lot.        General Comments General comments (skin integrity, edema, etc.): Daughter present during session.    Exercises     Assessment/Plan    PT Assessment Patient needs continued PT services  PT Problem List Decreased strength;Decreased mobility;Decreased balance;Pain;Decreased cognition;Decreased activity tolerance;Decreased safety awareness       PT Treatment Interventions Therapeutic activities;Gait  training;Therapeutic exercise;Patient/family education;Balance training;Functional mobility training;DME instruction    PT Goals (Current goals can be found in the Care Plan section)  Acute Rehab PT Goals Patient Stated Goal: to get better and go home PT Goal Formulation: With patient Time For Goal Achievement: 10/19/21 Potential to Achieve Goals: Fair    Frequency Min 3X/week     Co-evaluation               AM-PAC PT "6 Clicks" Mobility  Outcome Measure Help needed turning from your back to your side while in a flat bed without using bedrails?: A Little Help needed moving from lying on your back to sitting on the side of a flat bed without using bedrails?: A Little Help needed moving to and from a bed to a chair (including a wheelchair)?: A Little Help needed standing up from a chair using your arms (e.g., wheelchair or bedside chair)?: A Little Help needed to walk in hospital room?: A Little Help needed climbing 3-5 steps with a railing? : A Lot 6 Click Score: 17    End of Session Equipment Utilized During Treatment: Gait belt Activity Tolerance: Patient tolerated treatment well;Patient limited by fatigue Patient left: in bed;with call bell/phone within reach;with bed alarm set;with family/visitor present Nurse Communication: Mobility status PT Visit Diagnosis: Muscle weakness (generalized) (M62.81);Difficulty in walking, not  elsewhere classified (R26.2);Unsteadiness on feet (R26.81)    Time: 5258-9483 PT Time Calculation (min) (ACUTE ONLY): 25 min   Charges:   PT Evaluation $PT Eval Moderate Complexity: 1 Mod PT Treatments $Gait Training: 8-22 mins        Marisa Severin, PT, DPT Acute Rehabilitation Services Secure chat preferred Office [place] 10/05/2021, 2:14 PM

## 2021-10-05 NOTE — Progress Notes (Signed)
Modified Barium Swallow Progress Note  Patient Details  Name: Kathryn Lewis MRN: [id number] Date of Birth: June 28, 1945  Today's Date: 10/05/2021  Modified Barium Swallow completed.  Full report located under Chart Review in the Imaging Section.  Brief recommendations include the following:  Clinical Impression  Pt seen for MBS and demonstrated mild oropharyngeal dysphagia marked by decreased cohesion with brief sublingual spill. Residue from oral cavity spilled to pyriform sinuses during straw trial.  Pharyngeal phase overall, within functional limits, with once instance of flash penetration with thin. System was challenged with multiple sips thin via straw. There was vallecular residue mild-mod that cleared with spontaneous second swallows. Pt did not have episodes of coughing at baseline as during previous encounters and work of breathing was stable during MBS. Her risk of penetration/aspiration increases as she becomes short of breath or is coughing at baseline. Recommend she continue with regular/thin liquids, straws allowed, pills whole in puree and rest breaks if needed. No further ST needed at this time.   Swallow Evaluation Recommendations       SLP Diet Recommendations: Regular solids;Thin liquid   Liquid Administration via: Cup;Straw   Medication Administration: Whole meds with puree   Supervision: Patient able to self feed   Compensations: Small sips/bites;Slow rate (rest breaks as needed)   Postural Changes: Seated upright at 90 degrees   Oral Care Recommendations: Oral care BID        [place] Siren 10/05/2021,3:02 PM

## 2021-10-05 NOTE — Progress Notes (Signed)
PROGRESS NOTE    Kathryn Lewis  BJY:[id number] DOB: 16-Oct-1945 DOA: 09/30/2021 PCP: [place], Betty G, MD   Brief Narrative: 76 year old with past medical history significant for dementia type 2 diabetes, hyperlipidemia, hypertension, CKD stage IIIb, CAD, peripheral artery disease, prior CVA, seizure disorder, OSA, COPD former smoker quit 2015 who presents complaining of productive cough.  She is a poor historian due to history of dementia.  CTA chest was negative for PE, lungs clear no pleural effusion.  She was found to have elevation of troponin which peaked to 304.  Cardiology was consulted and recommendation was to start heparin drip and blood pressure control.  Patient was found to have  blood pressure 206/64.    Assessment & Plan:   Principal Problem:   Elevated troponin Active Problems:   Seizure disorder as sequela of cerebrovascular accident ([place])   CAD (coronary artery disease)   Diabetes mellitus type 2 with neurological manifestations ([place])   Type II diabetes mellitus with neurological manifestations ([place])   CKD (chronic kidney disease) stage 3, GFR 30-59 ml/min ([facility])   1-Elevation of troponin: Cardiology following.  Thought to be secondary to demand ischemia in setting Bronchitis, COPD.  Received  Heparin gtt for 48 hours.  Medical management.   Acute  COPD exacerbation:  She has cough, SOB.  Bronchitis Acute resp failure ruled out  -Continue with Antibiotics.  -Nebulizer.  -Continue with  brovana, Pulmicort.  -flutter valve, guaifenesin.  -Continue with IV solumedrol.  -Hypertonic saline Nebulizer. BID Chest PT>  Still with Cough, SOB, continue with current management.   Hypertension uncontrolled Continue with Norvasc and metoprolol. Cozaar increase to 100 mg  Started  oral Hydralazine.   Metabolic acidosis;  ABG PH normal.  Improved with IV fluids. Discontinue fluids today.   Diabetes type 2 with hyperglycemia Sliding scale insulin, singling 15  units  Hyperlipidemia: Continue with Crestor.  Chronic diastolic heart failure: Continue with the spironolactone  Seizure disorder: Continue with Dilantin,  Lamictal.  Dementia: Delirium precaution B12: 441. She has been having some worsening confusion.   Chronic hyponatremia/chronic CKD 3B: Monitor  Estimated body mass index is 24.1 kg/m as calculated from the following:   Height as of this encounter: '5\' 3"'$  (1.6 m).   Weight as of this encounter: 61.7 kg.   DVT prophylaxis: Heparin drip Code Status: DNR, discussed with daughter 55/10 Family Communication: Daughter Disposition Plan:  Status is: Inpatient Remains inpatient appropriate because: management of COPD    Consultants:  Cardiology   Procedures:    Antimicrobials:    Subjective: He continue to have cough, and SOB.  She has been confuse.    Objective: Vitals:   10/05/21 0757 10/05/21 0804 10/05/21 0805 10/05/21 1203  BP:    131/78  Pulse:    68  Resp:    19  Temp:    (!) 97.5 F (36.4 C)  TempSrc:    Oral  SpO2: 100% 98% 98% 100%  Weight:      Height:        Intake/Output Summary (Last 24 hours) at 10/05/2021 1355 Last data filed at 10/05/2021 0800 Gross per 24 hour  Intake 2513.25 ml  Output 950 ml  Net 1563.25 ml    Filed Weights   09/30/21 1659  Weight: 61.7 kg    Examination:  General exam: NAD Respiratory system: BL [place].  Cardiovascular system: S 1, S 2 RRR Gastrointestinal system: BS present, soft, nt Central nervous system: Alert, follows command   Data Reviewed:  I have personally reviewed following labs and imaging studies  CBC: Recent Labs  Lab 09/30/21 1811 09/30/21 1933 10/01/21 0736 10/03/21 0234 10/04/21 0817 10/05/21 0550  WBC 11.8*  --  13.0* 12.7* 14.9* 11.3*  HGB 10.7* 11.6* 10.8* 9.6* 10.2* 10.0*  HCT 32.0* 34.0* 32.6* 28.3* 30.0* 30.2*  MCV 86.0  --  84.7 85.0 85.5 87.0  PLT 215  --  241 240 288 314    Basic Metabolic Panel: Recent Labs   Lab 10/01/21 0736 10/02/21 0512 10/03/21 0234 10/04/21 0817 10/05/21 0550  NA 129* 130* 132* 133* 133*  K 4.4 4.3 4.4 3.8 4.5  CL 98 99 102 102 106  CO2 22 19* 19* 16* 18*  GLUCOSE 182* 206* 106* 112* 312*  BUN '18 18 20 19 17  '$ CREATININE 1.17* 0.97 0.97 1.04* 1.03*  CALCIUM 10.2 9.1 9.2 9.7 9.3  MG  --  2.0  --   --   --   PHOS  --  3.1  --   --   --     GFR: Estimated Creatinine Clearance: 39 mL/min (A) (by C-G formula based on SCr of 1.03 mg/dL (H)). Liver Function Tests: Recent Labs  Lab 09/30/21 1811  AST 15  ALT 11  ALKPHOS 82  BILITOT 0.4  PROT 9.7*  ALBUMIN 4.1    No results for input(s): "LIPASE", "AMYLASE" in the last 168 hours. No results for input(s): "AMMONIA" in the last 168 hours. Coagulation Profile: No results for input(s): "INR", "PROTIME" in the last 168 hours. Cardiac Enzymes: No results for input(s): "CKTOTAL", "CKMB", "CKMBINDEX", "TROPONINI" in the last 168 hours. BNP (last 3 results) No results for input(s): "PROBNP" in the last 8760 hours. HbA1C: No results for input(s): "HGBA1C" in the last 72 hours.  CBG: Recent Labs  Lab 10/05/21 0006 10/05/21 0359 10/05/21 0610 10/05/21 0754 10/05/21 1158  GLUCAP 166* 70 312* 198* 97    Lipid Profile: No results for input(s): "CHOL", "HDL", "LDLCALC", "TRIG", "CHOLHDL", "LDLDIRECT" in the last 72 hours.  Thyroid Function Tests: No results for input(s): "TSH", "T4TOTAL", "FREET4", "T3FREE", "THYROIDAB" in the last 72 hours. Anemia Panel: No results for input(s): "VITAMINB12", "FOLATE", "FERRITIN", "TIBC", "IRON", "RETICCTPCT" in the last 72 hours.  Sepsis Labs: Recent Labs  Lab 10/02/21 0512  PROCALCITON 0.15     Recent Results (from the past 240 hour(s))  Resp Panel by RT-PCR (Flu A&B, Covid) Anterior Nasal Swab     Status: None   Collection Time: 09/30/21  6:11 PM   Specimen: Anterior Nasal Swab  Result Value Ref Range Status   SARS Coronavirus 2 by RT PCR NEGATIVE NEGATIVE  Final    Comment: (NOTE) SARS-CoV-2 target nucleic acids are NOT DETECTED.  The SARS-CoV-2 RNA is generally detectable in upper respiratory specimens during the acute phase of infection. The lowest concentration of SARS-CoV-2 viral copies this assay can detect is 138 copies/mL. A negative result does not preclude SARS-Cov-2 infection and should not be used as the sole basis for treatment or other patient management decisions. A negative result may occur with  improper specimen collection/handling, submission of specimen other than nasopharyngeal swab, presence of viral mutation(s) within the areas targeted by this assay, and inadequate number of viral copies(<138 copies/mL). A negative result must be combined with clinical observations, patient history, and epidemiological information. The expected result is Negative.  Fact Sheet for Patients:  EntrepreneurPulse.com.au  Fact Sheet for Healthcare Providers:  IncredibleEmployment.be  This test is no t yet approved or cleared  by the [place] and  has been authorized for detection and/or diagnosis of SARS-CoV-2 by FDA under an Emergency Use Authorization (EUA). This EUA will remain  in effect (meaning this test can be used) for the duration of the COVID-19 declaration under Section 564(b)(1) of the Act, 21 U.S.C.section 360bbb-3(b)(1), unless the authorization is terminated  or revoked sooner.       Influenza A by PCR NEGATIVE NEGATIVE Final   Influenza B by PCR NEGATIVE NEGATIVE Final    Comment: (NOTE) The Xpert Xpress SARS-CoV-2/FLU/RSV plus assay is intended as an aid in the diagnosis of influenza from Nasopharyngeal swab specimens and should not be used as a sole basis for treatment. Nasal washings and aspirates are unacceptable for Xpert Xpress SARS-CoV-2/FLU/RSV testing.  Fact Sheet for Patients: EntrepreneurPulse.com.au  Fact Sheet for Healthcare  Providers: IncredibleEmployment.be  This test is not yet approved or cleared by the [place] FDA and has been authorized for detection and/or diagnosis of SARS-CoV-2 by FDA under an Emergency Use Authorization (EUA). This EUA will remain in effect (meaning this test can be used) for the duration of the COVID-19 declaration under Section 564(b)(1) of the Act, 21 U.S.C. section 360bbb-3(b)(1), unless the authorization is terminated or revoked.  Performed at [facility], [address] [place], [place] [address]          Radiology Studies: DG CHEST PORT 1 VIEW  Result Date: 10/04/2021 CLINICAL DATA:  Shortness of breath EXAM: PORTABLE CHEST 1 VIEW COMPARISON:  09/30/2021 FINDINGS: Stable cardiomediastinal contours. Aortic atherosclerosis. Slightly low lung volumes with mild bibasilar atelectasis. No appreciable pleural fluid collection. No pneumothorax. IMPRESSION: Low lung volumes with mild bibasilar atelectasis. Electronically Signed   By: Davina Poke D.O.   On: 10/04/2021 11:37        Scheduled Meds:  amLODipine  10 mg Oral Daily   arformoterol  15 mcg Nebulization BID   aspirin EC  81 mg Oral Daily   budesonide (PULMICORT) nebulizer solution  0.25 mg Nebulization BID   clopidogrel  75 mg Oral Daily   ezetimibe  10 mg Oral Daily   guaiFENesin  1,200 mg Oral BID   heparin  5,000 Units Subcutaneous Q8H   hydrALAZINE  25 mg Oral Q8H   insulin aspart  0-9 Units Subcutaneous Q4H   insulin glargine-yfgn  15 Units Subcutaneous QHS   ipratropium-albuterol  3 mL Nebulization Q6H   isosorbide mononitrate  30 mg Oral Daily   lamoTRIgine  300 mg Oral BID   losartan  100 mg Oral Daily   methylPREDNISolone (SOLU-MEDROL) injection  40 mg Intravenous Q12H   metoprolol tartrate  50 mg Oral BID   phenytoin  30 mg Oral QHS   phenytoin  300 mg Oral Daily   rosuvastatin  40 mg Oral Daily   sodium chloride HYPERTONIC  4 mL Nebulization BID    spironolactone  25 mg Oral Daily   umeclidinium bromide  1 puff Inhalation Daily   Continuous Infusions:  cefTRIAXone (ROCEPHIN)  IV Stopped (10/04/21 2132)     LOS: 4 days    Time spent: 35 minutes   Sonita Michiels A Martie Muhlbauer, MD [facility]   If 7PM-7AM, please contact night-coverage [email or web address]  10/05/2021, 1:55 PM

## 2021-10-05 NOTE — Progress Notes (Signed)
CPT held at this time. Patient sleeping. Per PRN do not disturb.

## 2021-10-06 DIAGNOSIS — R778 Other specified abnormalities of plasma proteins: Secondary | ICD-10-CM | POA: Diagnosis not present

## 2021-10-06 LAB — BASIC METABOLIC PANEL
Anion gap: 9 (ref 5–15)
BUN: 19 mg/dL (ref 8–23)
CO2: 19 mmol/L — ABNORMAL LOW (ref 22–32)
Calcium: 9.9 mg/dL (ref 8.9–10.3)
Chloride: 109 mmol/L (ref 98–111)
Creatinine, Ser: 1.07 mg/dL — ABNORMAL HIGH (ref 0.44–1.00)
GFR, Estimated: 54 mL/min — ABNORMAL LOW (ref >60)
Glucose, Bld: 136 mg/dL — ABNORMAL HIGH (ref 70–99)
Potassium: 3.8 mmol/L (ref 3.5–5.1)
Sodium: 137 mmol/L (ref 135–145)

## 2021-10-06 LAB — GLUCOSE, CAPILLARY
Glucose-Capillary: 89 mg/dL (ref 70–99)
Glucose-Capillary: 153 mg/dL — ABNORMAL HIGH (ref 70–99)
Glucose-Capillary: 122 mg/dL — ABNORMAL HIGH (ref 70–99)
Glucose-Capillary: 289 mg/dL — ABNORMAL HIGH (ref 70–99)
Glucose-Capillary: 129 mg/dL — ABNORMAL HIGH (ref 70–99)
Glucose-Capillary: 258 mg/dL — ABNORMAL HIGH (ref 70–99)

## 2021-10-06 MED ORDER — IPRATROPIUM-ALBUTEROL 0.5-2.5 (3) MG/3ML IN SOLN
3.0000 mL | Freq: Two times a day (BID) | RESPIRATORY_TRACT | Status: DC
  Administered 2021-10-06 – 2021-10-09 (×6): 3 mL via RESPIRATORY_TRACT
  Filled 2021-10-06 (×6): qty 3

## 2021-10-06 NOTE — Evaluation (Signed)
Occupational Therapy Evaluation Patient Details Name: Kathryn Lewis MRN: [id number] DOB: 1945/03/08 Today's Date: 10/06/2021   History of Present Illness Patient is a 76 y/o female who presents on 09/30/21 with SOB and cough. CTA chest was negative for pulmonary embolism and unequivocal for pneumonia. Admitted with Acute COPD exacerbation. Troponin up trended, peaked at 304. PMH includes CAD, PAD, CVA, MI, bipolar, CKD, OSA, depression, DM.   Clinical Impression   Pt admitted as above presenting with deficits as listed below.  Pt is overall Min guard for safety and sequencing with RW and transfers to 3:1. Pt with left visual field deficit from previous CVA as well as decreased cognition at baseline. She is from ALF and plans to d/c back to ALF when medically able. She reports being independent for ADLs at baseline and uses w/c to get to the dining hall for meals. Per her report, she walks around her room without difficulty using RW. Pt should benefit from continued acute OT to assist in maximizing independence with functional mobiity and ADL's for safe return to ALF.     Recommendations for follow up therapy are one component of a multi-disciplinary discharge planning process, led by the attending physician.  Recommendations may be updated based on patient status, additional functional criteria and insurance authorization.   Follow Up Recommendations  Home health OT    Assistance Recommended at Discharge Frequent or constant Supervision/Assistance  Patient can return home with the following A little help with walking and/or transfers;A little help with bathing/dressing/bathroom;Assistance with cooking/housework;Help with stairs or ramp for entrance;Assist for transportation;Direct supervision/assist for medications management;Direct supervision/assist for financial management    Functional Status Assessment  Patient has had a recent decline in their functional status and demonstrates the  ability to make significant improvements in function in a reasonable and predictable amount of time.  Equipment Recommendations  None recommended by OT    Recommendations for Other Services       Precautions / Restrictions Precautions Precautions: Fall Restrictions Weight Bearing Restrictions: No      Mobility Bed Mobility Overal bed mobility: Needs Assistance Bed Mobility: Supine to Sit, Sit to Supine     Supine to sit: Min guard, HOB elevated Sit to supine: Min guard, HOB elevated   General bed mobility comments: increased time and effort w/ verbal cues, no physical assist needed. Use of rail. Able to scoot self up in bed with cues    Transfers Overall transfer level: Needs assistance Equipment used: Rolling walker (2 wheels) Transfers: Sit to/from Stand Sit to Stand: Min guard   General transfer comment: Min guard for safety. Stood from EOB and transferred to 3:1 with consistent verbal cues for safety and sequencing      Balance Overall balance assessment: Needs assistance Sitting-balance support: Feet supported, No upper extremity supported Sitting balance-Leahy Scale: Fair     Standing balance support: During functional activity, Bilateral upper extremity supported Standing balance-Leahy Scale: Fair     ADL either performed or assessed with clinical judgement   ADL Overall ADL's : Needs assistance/impaired Eating/Feeding: Set up;Sitting;Bed level   Grooming: Wash/dry hands;Wash/dry face;Set up;Sitting   Upper Body Bathing: Set up;Sitting   Lower Body Bathing: Min guard;Sit to/from stand   Upper Body Dressing : Sitting;Set up;Min guard   Lower Body Dressing: Min guard;Supervision/safety;Bed level;Sit to/from stand   Toilet Transfer: Min guard;BSC/3in1;Rolling walker (2 wheels);Ambulation;Cueing for safety;Cueing for sequencing Toilet Transfer Details (indicate cue type and reason): VC's and redirection to task for safety & sequencing  with  RW Toileting- Clothing Manipulation and Hygiene: Modified independent;Set up;Sitting/lateral lean   Functional mobility during ADLs: Min guard;Rolling walker (2 wheels);Cueing for safety;Cueing for sequencing General ADL Comments: Pt was seen for acute OT assessment followed by verbal education on rold of OT and goals. Pt is overall Min guard for safety and sequencing with RW and transfers to 3:1. Pt with previous left visual field deficit from previous CVA as well as decreased cognition at baseline. She is from ALF and plans to d/c back to ALF when medically able. She reports being independent for ADLs at baseline and uses w/c to get to the dining hall for meals. Per her report, she walks around her room without difficulty using RW. Pt should benefit from continued acute OT to assist in maximizing independence with functional mobiity and ADL's for safe return to ALF.     Vision Baseline Vision/History:  (H/o left visual field deficit from previous CVA. "I can't see out of this eye" left per pt report) Patient Visual Report: No change from baseline;Other (comment) (H/o left visual field deficit from previous CVA) Additional Comments: Left visual field deficit from previous CVA, no change from baseline            Pertinent Vitals/Pain Pain Assessment Pain Assessment: No/denies pain Pain Score: 0-No pain     Hand Dominance Right   Extremity/Trunk Assessment Upper Extremity Assessment Upper Extremity Assessment: Overall WFL for tasks assessed   Lower Extremity Assessment Lower Extremity Assessment: Defer to PT evaluation;Generalized weakness       Communication Communication Communication: No difficulties   Cognition Arousal/Alertness: Awake/alert Behavior During Therapy: WFL for tasks assessed/performed Overall Cognitive Status: History of cognitive impairments - at baseline     General Comments: Per chart review and PT assessment: per daughter, pt is at cognitive baseline,  "mixes up words sometimes" and has poor memory. Sundowns at baseline. Is sarcastic and jokes a lot.     General Comments               Home Living Family/patient expects to be discharged to:: Assisted living   Home Equipment: Rolling Walker (2 wheels);BSC/3in1;Wheelchair - manual          Prior Functioning/Environment Prior Level of Function : Independent/Modified Independent     Mobility Comments: USes w/c to go down to dining hall using feet or arms or facility pushes her,w alks around room but has vision deficits. ADLs Comments: independent        OT Problem List: Decreased knowledge of use of DME or AE;Decreased activity tolerance;Decreased safety awareness      OT Treatment/Interventions: Self-care/ADL training;Patient/family education;Therapeutic activities;DME and/or AE instruction    OT Goals(Current goals can be found in the care plan section) Acute Rehab OT Goals Patient Stated Goal: Go back to ALF OT Goal Formulation: Patient unable to participate in goal setting Time For Goal Achievement: 10/20/21 Potential to Achieve Goals: Fair  OT Frequency: Min 2X/week       AM-PAC OT "6 Clicks" Daily Activity     Outcome Measure Help from another person eating meals?: A Little Help from another person taking care of personal grooming?: A Little Help from another person toileting, which includes using toliet, bedpan, or urinal?: A Little Help from another person bathing (including washing, rinsing, drying)?: A Little Help from another person to put on and taking off regular upper body clothing?: A Little Help from another person to put on and taking off regular lower body clothing?: A Little  6 Click Score: 18   End of Session Equipment Utilized During Treatment: Rolling walker (2 wheels) Nurse Communication: Mobility status  Activity Tolerance: Patient tolerated treatment well Patient left: in bed;with call bell/phone within reach;with bed alarm set  OT Visit  Diagnosis: Unsteadiness on feet (R26.81);Other abnormalities of gait and mobility (R26.89)                Time: 9702-6378 OT Time Calculation (min): 25 min Charges:  OT General Charges $OT Visit: 1 Visit OT Evaluation $OT Eval Low Complexity: 1 Low  Emrys Mckamie Beth Dixon, OT 10/06/2021, 10:02 AM

## 2021-10-06 NOTE — Plan of Care (Signed)

## 2021-10-06 NOTE — Progress Notes (Signed)
Physical Therapy Treatment Patient Details Name: Kathryn Lewis MRN: [id number] DOB: 01-15-1946 Today's Date: 10/06/2021   History of Present Illness Patient is a 76 y/o female who presents on 09/30/21 with SOB and cough. CTA chest  negative for PE and unequivocal for PNA. Workup for acute COPD exacerbation. Elevated troponin suspect due to demand ischemia. PMH includes CAD, PAD, CVA, MI, bipolar, CKD, OSA, depression, DM.   PT Comments    Pt progressing with mobility. Today's session focused on transfer and gait training with RW, requiring intermittent minA for stability and verbal cues for sequencing with visual deficits. Pt with poor short-term memory and safety awareness requiring frequent repetition. Pt remains limited by generalized weakness, decreased activity tolerance, poor balance strategies/postural reactions and impaired cognition. Continue to recommend follow-up with HHPT at ALF to maximize functional mobility and independence.    Recommendations for follow up therapy are one component of a multi-disciplinary discharge planning process, led by the attending physician.  Recommendations may be updated based on patient status, additional functional criteria and insurance authorization.  Follow Up Recommendations  Home health PT     Assistance Recommended at Discharge Frequent or constant Supervision/Assistance  Patient can return home with the following A little help with walking and/or transfers;A little help with bathing/dressing/bathroom;Direct supervision/assist for financial management;Direct supervision/assist for medications management;Assistance with cooking/housework   Equipment Recommendations  None recommended by PT    Recommendations for Other Services  Mobility Specialist     Precautions / Restrictions Precautions Precautions: Fall Restrictions Weight Bearing Restrictions: No     Mobility  Bed Mobility Overal bed mobility: Needs Assistance Bed Mobility:  Supine to Sit     Supine to sit: Supervision, HOB elevated     General bed mobility comments: use of bed rail    Transfers Overall transfer level: Needs assistance Equipment used: 1 person hand held assist Transfers: Sit to/from Stand, Bed to chair/wheelchair/BSC Sit to Stand: Min guard   Step pivot transfers: Min guard       General transfer comment: initial stand and pivotal steps to recliner with min guard for HHA, max directional cues. additional sit<>stands from recliner to RW, cues for hand placement, min guard for balance    Ambulation/Gait Ambulation/Gait assistance: Min guard, Min assist Gait Distance (Feet): 60 Feet Assistive device: Rolling walker (2 wheels) Gait Pattern/deviations: Step-through pattern, Decreased stride length, Trunk flexed Gait velocity: decreased     General Gait Details: Slow, unsteady gait with cues for RW proximity/management and max directional cues due to difficulty seeing in left visual field running into things on left side. pt quick to fatigue reporting "I have to sit now"   Stairs             Wheelchair Mobility    Modified Rankin (Stroke Patients Only)       Balance Overall balance assessment: Needs assistance Sitting-balance support: Feet supported, No upper extremity supported Sitting balance-Leahy Scale: Fair     Standing balance support: During functional activity, Bilateral upper extremity supported, No upper extremity supported Standing balance-Leahy Scale: Fair Standing balance comment: can static stand without UE support, unable to accept challenge; preferemce for UE support                            Cognition Arousal/Alertness: Awake/alert Behavior During Therapy: WFL for tasks assessed/performed Overall Cognitive Status: History of cognitive impairments - at baseline  General Comments: Per chart review and PT assessment: per daughter, pt is at  cognitive baseline, "mixes up words sometimes" and has poor memory. Sundowns at baseline. Is sarcastic and jokes a lot.        Exercises Other Exercises Other Exercises: 5x repeated sit<>stand from recliner to RW (reliant on UE support; various seated rest breaks due to bouts of coughing)    General Comments General comments (skin integrity, edema, etc.): pt endorses SOB and coughing with mobility, SpO2 96% on RA, HR 78      Pertinent Vitals/Pain Pain Assessment Pain Assessment: Faces Faces Pain Scale: Hurts a little bit Pain Location: R-side abdomen Pain Descriptors / Indicators: Sore Pain Intervention(s): Monitored during session    Home Living Family/patient expects to be discharged to:: Assisted living                 Home Equipment: Conservation officer, nature (2 wheels);BSC/3in1;Wheelchair - manual      Prior Function            PT Goals (current goals can now be found in the care plan section) Progress towards PT goals: Progressing toward goals    Frequency    Min 3X/week      PT Plan Current plan remains appropriate    Co-evaluation              AM-PAC PT "6 Clicks" Mobility   Outcome Measure  Help needed turning from your back to your side while in a flat bed without using bedrails?: A Little Help needed moving from lying on your back to sitting on the side of a flat bed without using bedrails?: A Little Help needed moving to and from a bed to a chair (including a wheelchair)?: A Little Help needed standing up from a chair using your arms (e.g., wheelchair or bedside chair)?: A Little Help needed to walk in hospital room?: A Little Help needed climbing 3-5 steps with a railing? : A Lot 6 Click Score: 17    End of Session Equipment Utilized During Treatment: Gait belt Activity Tolerance: Patient tolerated treatment well;Patient limited by fatigue Patient left: in chair;with call bell/phone within reach;with chair alarm set Nurse Communication:  Mobility status PT Visit Diagnosis: Muscle weakness (generalized) (M62.81);Difficulty in walking, not elsewhere classified (R26.2);Unsteadiness on feet (R26.81)     Time: 3557-3220 PT Time Calculation (min) (ACUTE ONLY): 20 min  Charges:  $Therapeutic Activity: 8-22 mins                     Mabeline Caras, PT, DPT Acute Rehabilitation Services  Personal: [facility] Office: [place] 10/06/2021, 1:08 PM

## 2021-10-06 NOTE — Progress Notes (Addendum)
PROGRESS NOTE    Kathryn Lewis  AJG:[id number] DOB: 02-15-1945 DOA: 09/30/2021 PCP: [place], Betty G, MD   Brief Narrative: 76 year old with past medical history significant for dementia type 2 diabetes, hyperlipidemia, hypertension, CKD stage IIIb, CAD, peripheral artery disease, prior CVA, seizure disorder, OSA, COPD former smoker quit 2015 who presents complaining of productive cough.  She is a poor historian due to history of dementia.  CTA chest was negative for PE, lungs clear no pleural effusion.  She was found to have elevation of troponin which peaked to 304.  Cardiology was consulted and recommendation was to start heparin drip and blood pressure control.  Patient was found to have  blood pressure 206/64.   Admitted with COPD exacerbation, slow to improved.   Assessment & Plan:   Principal Problem:   Elevated troponin Active Problems:   Seizure disorder as sequela of cerebrovascular accident ([place])   CAD (coronary artery disease)   Diabetes mellitus type 2 with neurological manifestations ([place])   Type II diabetes mellitus with neurological manifestations ([place])   CKD (chronic kidney disease) stage 3, GFR 30-59 ml/min (HCC)   1-Elevation of troponin: Cardiology following.  Thought to be secondary to demand ischemia in setting Bronchitis, COPD.  Received  Heparin gtt for 48 hours.  Medical management.   Acute  COPD exacerbation:  She has cough, SOB.  Bronchitis Acute resp failure ruled out  -Completed 5 days of antibiotics.  -Nebulizer.  -Continue with  brovana, Pulmicort.  -Flutter valve, guaifenesin.  -Continue with IV solumedrol.  -Hypertonic saline Nebulizer. BID Chest PT>  Improved today, she will benefit from IV steroid for another 24 hours  Hypertension uncontrolled Continue with Norvasc and metoprolol. Cozaar increase to 100 mg  Started  oral Hydralazine.   Metabolic acidosis;  ABG PH normal.  Improved with IV fluids. NSL.   Diabetes type 2 with  hyperglycemia Sliding scale insulin, singling 15 units  Hyperlipidemia: Continue with Crestor.  Chronic diastolic heart failure: Continue with  spironolactone  Seizure disorder: Continue with Dilantin,  Lamictal.  History Of Stroke;  Per daughter patient doesn't have history of dementia. She has had some confusion, mild after she had stroke.  Patient with confusion, likely Delirium.  B12: 441.   Chronic hyponatremia/chronic CKD 3B: Monitor  Estimated body mass index is 24.1 kg/m as calculated from the following:   Height as of this encounter: '5\' 3"'$  (1.6 m).   Weight as of this encounter: 61.7 kg.   DVT prophylaxis: Heparin drip Code Status: DNR, discussed with daughter 48/10 Family Communication: Daughter 9/12 over phone Disposition Plan:  Status is: Inpatient Remains inpatient appropriate because: management of COPD. Discharge in 1 or 2 days. HH ordered.     Consultants:  Cardiology   Procedures:    Antimicrobials:    Subjective: She is feeling better today, cough improving.   Objective: Vitals:   10/06/21 0743 10/06/21 0809 10/06/21 1219 10/06/21 1409  BP:   (!) 120/52 (!) 155/55  Pulse:   76   Resp:   17   Temp:   (!) 97.5 F (36.4 C)   TempSrc:   Oral   SpO2: 99% 99% 98%   Weight:      Height:        Intake/Output Summary (Last 24 hours) at 10/06/2021 1414 Last data filed at 10/05/2021 1900 Gross per 24 hour  Intake 1341.42 ml  Output 750 ml  Net 591.42 ml    Filed Weights   09/30/21 1659  Weight:  61.7 kg    Examination:  General exam: NAD Respiratory system: BL ronchus Cardiovascular system: S 1, S 2 RRR Gastrointestinal system: BS present, soft,nt Central nervous system: Alert, calm,. Following command   Data Reviewed: I have personally reviewed following labs and imaging studies  CBC: Recent Labs  Lab 09/30/21 1811 09/30/21 1933 10/01/21 0736 10/03/21 0234 10/04/21 0817 10/05/21 0550  WBC 11.8*  --  13.0* 12.7* 14.9*  11.3*  HGB 10.7* 11.6* 10.8* 9.6* 10.2* 10.0*  HCT 32.0* 34.0* 32.6* 28.3* 30.0* 30.2*  MCV 86.0  --  84.7 85.0 85.5 87.0  PLT 215  --  241 240 288 937    Basic Metabolic Panel: Recent Labs  Lab 10/02/21 0512 10/03/21 0234 10/04/21 0817 10/05/21 0550 10/06/21 0838  NA 130* 132* 133* 133* 137  K 4.3 4.4 3.8 4.5 3.8  CL 99 102 102 106 109  CO2 19* 19* 16* 18* 19*  GLUCOSE 206* 106* 112* 312* 136*  BUN '18 20 19 17 19  '$ CREATININE 0.97 0.97 1.04* 1.03* 1.07*  CALCIUM 9.1 9.2 9.7 9.3 9.9  MG 2.0  --   --   --   --   PHOS 3.1  --   --   --   --     GFR: Estimated Creatinine Clearance: 37.6 mL/min (A) (by C-G formula based on SCr of 1.07 mg/dL (H)). Liver Function Tests: Recent Labs  Lab 09/30/21 1811  AST 15  ALT 11  ALKPHOS 82  BILITOT 0.4  PROT 9.7*  ALBUMIN 4.1    No results for input(s): "LIPASE", "AMYLASE" in the last 168 hours. No results for input(s): "AMMONIA" in the last 168 hours. Coagulation Profile: No results for input(s): "INR", "PROTIME" in the last 168 hours. Cardiac Enzymes: No results for input(s): "CKTOTAL", "CKMB", "CKMBINDEX", "TROPONINI" in the last 168 hours. BNP (last 3 results) No results for input(s): "PROBNP" in the last 8760 hours. HbA1C: No results for input(s): "HGBA1C" in the last 72 hours.  CBG: Recent Labs  Lab 10/05/21 2100 10/06/21 0059 10/06/21 0452 10/06/21 0831 10/06/21 1223  GLUCAP 195* 89 153* 122* 289*    Lipid Profile: No results for input(s): "CHOL", "HDL", "LDLCALC", "TRIG", "CHOLHDL", "LDLDIRECT" in the last 72 hours.  Thyroid Function Tests: No results for input(s): "TSH", "T4TOTAL", "FREET4", "T3FREE", "THYROIDAB" in the last 72 hours. Anemia Panel: No results for input(s): "VITAMINB12", "FOLATE", "FERRITIN", "TIBC", "IRON", "RETICCTPCT" in the last 72 hours.  Sepsis Labs: Recent Labs  Lab 10/02/21 0512  PROCALCITON 0.15     Recent Results (from the past 240 hour(s))  Resp Panel by RT-PCR (Flu A&B,  Covid) Anterior Nasal Swab     Status: None   Collection Time: 09/30/21  6:11 PM   Specimen: Anterior Nasal Swab  Result Value Ref Range Status   SARS Coronavirus 2 by RT PCR NEGATIVE NEGATIVE Final    Comment: (NOTE) SARS-CoV-2 target nucleic acids are NOT DETECTED.  The SARS-CoV-2 RNA is generally detectable in upper respiratory specimens during the acute phase of infection. The lowest concentration of SARS-CoV-2 viral copies this assay can detect is 138 copies/mL. A negative result does not preclude SARS-Cov-2 infection and should not be used as the sole basis for treatment or other patient management decisions. A negative result may occur with  improper specimen collection/handling, submission of specimen other than nasopharyngeal swab, presence of viral mutation(s) within the areas targeted by this assay, and inadequate number of viral copies(<138 copies/mL). A negative result must be combined with  clinical observations, patient history, and epidemiological information. The expected result is Negative.  Fact Sheet for Patients:  EntrepreneurPulse.com.au  Fact Sheet for Healthcare Providers:  IncredibleEmployment.be  This test is no t yet approved or cleared by the [place] FDA and  has been authorized for detection and/or diagnosis of SARS-CoV-2 by FDA under an Emergency Use Authorization (EUA). This EUA will remain  in effect (meaning this test can be used) for the duration of the COVID-19 declaration under Section 564(b)(1) of the Act, 21 U.S.C.section 360bbb-3(b)(1), unless the authorization is terminated  or revoked sooner.       Influenza A by PCR NEGATIVE NEGATIVE Final   Influenza B by PCR NEGATIVE NEGATIVE Final    Comment: (NOTE) The Xpert Xpress SARS-CoV-2/FLU/RSV plus assay is intended as an aid in the diagnosis of influenza from Nasopharyngeal swab specimens and should not be used as a sole basis for treatment. Nasal  washings and aspirates are unacceptable for Xpert Xpress SARS-CoV-2/FLU/RSV testing.  Fact Sheet for Patients: EntrepreneurPulse.com.au  Fact Sheet for Healthcare Providers: IncredibleEmployment.be  This test is not yet approved or cleared by the [place] FDA and has been authorized for detection and/or diagnosis of SARS-CoV-2 by FDA under an Emergency Use Authorization (EUA). This EUA will remain in effect (meaning this test can be used) for the duration of the COVID-19 declaration under Section 564(b)(1) of the Act, 21 U.S.C. section 360bbb-3(b)(1), unless the authorization is terminated or revoked.  Performed at [facility], [address], [place], [place] [address]          Radiology Studies: DG Swallowing [facility] Pathology  Result Date: 10/05/2021 Table formatting from the original result was not included. Images from the original result were not included. Objective Swallowing Evaluation: Type of Study: MBS-Modified Barium Swallow Study  Patient Details Name: Kathryn Lewis MRN: [id number] Date of Birth: May 06, 1945 Today's Date: 10/05/2021 Time: SLP Start Time (ACUTE ONLY): 1219 -SLP Stop Time (ACUTE ONLY): 1235 SLP Time Calculation (min) (ACUTE ONLY): 16 min Past Medical History: Past Medical History: Diagnosis Date  Allergy   Arthritis   Bipolar 1 disorder ([place])   Blood transfusion without reported diagnosis   CAD (coronary artery disease) 04/17/2015  Carotid artery stenosis   50-69% bilateral ICA stenoses by velocity criteria but no visible plaque and ICA/CCA ratio 1.72 on right and 1.38 on left  Chronic airway obstruction, not elsewhere classified 03/02/2010  Overview:  Chronic Obstructive Pulmonary Disease  CKD (chronic kidney disease) stage 3, GFR 30-59 ml/min ([place]) 09/18/2021  Coronary artery disease   Depression   Diabetes mellitus without complication ([place])   Echocardiogram   Echo 06/2018: Hyperdynamic systolic  function, EF >38, mild concentric LVH, elevated LVEDP (E/E' suggests impaired relaxation), mild AI, lipomatous interatrial septum  Hypertension   Left adrenal mass (HCC)   2.5 cm L adrenal mass on CT per note of Dr. Seleta Rhymes 05/20/2014  MI, old   PAD (peripheral artery disease) ([place])   03/2014 - CT aortogram with lower extremity runoff (mild to moderate disease in common iliac arteries, complete occulusion of her bilateral external iliac arteries with heavily disease common femoral arteries. She also has disease to her SFAs bilaterally. Popliteal arteries and tibial vessel runoff appears to be adequate)  Seizures ([place])   Stroke [facility])  Past Surgical History: Past Surgical History: Procedure Laterality Date  BREAST SURGERY  2012  small lump nonmilignant  NECK SURGERY   HPI: Kathryn Lewis is a 76 y.o. female with medical history  significant for type 2 diabetes, COPD, hyperlipidemia, hypertension, CKD 3B, coronary artery disease, peripheral artery disease, CVA, seizure disorder, OSA, who initially presented to [facility] ED with complaints of persistent nonproductive cough. CTA chest was negative for pulmonary embolism and unequivocal for pneumonia.  COVID-19 negative.  Troponin up trended, peaked at 304.  No data recorded  Recommendations for follow up therapy are one component of a multi-disciplinary discharge planning process, led by the attending physician.  Recommendations may be updated based on patient status, additional functional criteria and insurance authorization. Assessment / Plan / Recommendation   10/05/2021   1:03 PM Clinical Impressions Clinical Impression Pt seen for MBS and demonstrated mild oropharyngeal dysphagia marked by decreased cohesion with brief sublingual spill. Residue from oral cavity spilled to pyriform sinuses during straw trial.  Pharyngeal phase overall, within functional limits, with once instance of flash penetration with thin. System was challenged with multiple sips thin via straw.  There was vallecular residue mild-mod that cleared with spontaneous second swallows. Pt did not have episodes of coughing at baseline as during previous encounters and work of breathing was stable during MBS. Her risk of penetration/aspiration increases as she becomes short of breath or is coughing at baseline. Recommend she continue with regular/thin liquids, straws allowed, pills whole in puree and rest breaks if needed. No further ST needed at this time. SLP Visit Diagnosis Dysphagia, oropharyngeal phase (R13.12) Impact on safety and function Mild aspiration risk     10/05/2021   1:03 PM Treatment Recommendations Treatment Recommendations No treatment recommended at this time      No data to display      10/05/2021   1:03 PM Diet Recommendations SLP Diet Recommendations Regular solids;Thin liquid Liquid Administration via Cup;Straw Medication Administration Whole meds with puree Compensations Small sips/bites;Slow rate Postural Changes Seated upright at 90 degrees     10/05/2021   1:03 PM Other Recommendations Oral Care Recommendations Oral care BID Follow Up Recommendations No SLP follow up Assistance recommended at discharge None   10/02/2021   2:43 PM Frequency and Duration  Speech Therapy Frequency (ACUTE ONLY) min 1 x/week Treatment Duration 1 week     10/05/2021   1:03 PM Oral Phase Oral Phase Impaired Oral - Thin Cup WFL Oral - Thin Straw Decreased bolus cohesion Oral - Puree WFL Oral - Regular --    10/05/2021   1:03 PM Pharyngeal Phase Pharyngeal Phase Impaired Pharyngeal- Thin Cup Penetration/Aspiration during swallow;Pharyngeal residue - valleculae Pharyngeal Material enters airway, remains ABOVE vocal cords then ejected out Pharyngeal- Thin Straw Delayed swallow initiation-pyriform sinuses;Pharyngeal residue - pyriform;Pharyngeal residue - valleculae Pharyngeal- Puree Pharyngeal residue - valleculae Pharyngeal- Regular [facility]    10/05/2021   1:03 PM Cervical Esophageal Phase  Cervical Esophageal Phase WFL  Houston Siren 10/05/2021, 3:02 PM                          Scheduled Meds:  amLODipine  10 mg Oral Daily   arformoterol  15 mcg Nebulization BID   aspirin EC  81 mg Oral Daily   budesonide (PULMICORT) nebulizer solution  0.25 mg Nebulization BID   clopidogrel  75 mg Oral Daily   ezetimibe  10 mg Oral Daily   guaiFENesin  1,200 mg Oral BID   heparin  5,000 Units Subcutaneous Q8H   hydrALAZINE  25 mg Oral Q8H   insulin aspart  0-9 Units Subcutaneous Q4H   insulin glargine-yfgn  15 Units Subcutaneous QHS  ipratropium-albuterol  3 mL Nebulization BID   isosorbide mononitrate  30 mg Oral Daily   lamoTRIgine  300 mg Oral BID   losartan  100 mg Oral Daily   methylPREDNISolone (SOLU-MEDROL) injection  40 mg Intravenous Q12H   metoprolol tartrate  50 mg Oral BID   phenytoin  30 mg Oral QHS   phenytoin  300 mg Oral Daily   rosuvastatin  40 mg Oral Daily   sodium chloride HYPERTONIC  4 mL Nebulization BID   spironolactone  25 mg Oral Daily   umeclidinium bromide  1 puff Inhalation Daily   Continuous Infusions:     LOS: 5 days    Time spent: 35 minutes   Presten Joost A Sameka Bagent, MD [facility]   If 7PM-7AM, please contact night-coverage [email or web address]  10/06/2021, 2:14 PM

## 2021-10-06 NOTE — TOC Progression Note (Signed)
Transition of Care [facility]) - Progression Note    Patient Details  Name: Trana Ressler MRN: [id number] Date of Birth: 11-14-1945  Transition of Care [facility]) CM/SW [place], [place] Phone Number: 10/06/2021, 2:14 PM  Clinical Narrative:     Update- CSW received callback from [facility] with [facility] she is going to stop by to see patient tomorrow to complete assessment to confirm they can accept patient back when medically ready for dc. [name] confirmed she will follow up with CSW once assessment completed.   CSW called [facility] to discuss dc plan for patient. CSW called and spoke with [facility] and [facility] informed CSW that Hinda Kehr will give CSW a callback. CSW awaiting callback.  Expected Discharge Plan: Assisted Living Barriers to Discharge: Continued Medical Work up  Expected Discharge Plan and Services Expected Discharge Plan: Assisted Living In-house Referral: Clinical Social Work     Living arrangements for the past 2 months: [place] (From [facility])                                       Social Determinants of Health (SDOH) Interventions    Readmission Risk Interventions     No data to display

## 2021-10-07 DIAGNOSIS — I69398 Other sequelae of cerebral infarction: Secondary | ICD-10-CM

## 2021-10-07 DIAGNOSIS — E1149 Type 2 diabetes mellitus with other diabetic neurological complication: Secondary | ICD-10-CM

## 2021-10-07 DIAGNOSIS — N1831 Chronic kidney disease, stage 3a: Secondary | ICD-10-CM | POA: Diagnosis not present

## 2021-10-07 DIAGNOSIS — I25119 Atherosclerotic heart disease of native coronary artery with unspecified angina pectoris: Secondary | ICD-10-CM

## 2021-10-07 DIAGNOSIS — G40909 Epilepsy, unspecified, not intractable, without status epilepticus: Secondary | ICD-10-CM

## 2021-10-07 DIAGNOSIS — R778 Other specified abnormalities of plasma proteins: Secondary | ICD-10-CM | POA: Diagnosis not present

## 2021-10-07 LAB — BASIC METABOLIC PANEL
Anion gap: 8 (ref 5–15)
BUN: 20 mg/dL (ref 8–23)
CO2: 20 mmol/L — ABNORMAL LOW (ref 22–32)
Calcium: 9.8 mg/dL (ref 8.9–10.3)
Chloride: 106 mmol/L (ref 98–111)
Creatinine, Ser: 1.21 mg/dL — ABNORMAL HIGH (ref 0.44–1.00)
GFR, Estimated: 47 mL/min — ABNORMAL LOW (ref >60)
Glucose, Bld: 149 mg/dL — ABNORMAL HIGH (ref 70–99)
Potassium: 4.7 mmol/L (ref 3.5–5.1)
Sodium: 134 mmol/L — ABNORMAL LOW (ref 135–145)

## 2021-10-07 LAB — GLUCOSE, CAPILLARY
Glucose-Capillary: 119 mg/dL — ABNORMAL HIGH (ref 70–99)
Glucose-Capillary: 144 mg/dL — ABNORMAL HIGH (ref 70–99)
Glucose-Capillary: 159 mg/dL — ABNORMAL HIGH (ref 70–99)
Glucose-Capillary: 164 mg/dL — ABNORMAL HIGH (ref 70–99)
Glucose-Capillary: 165 mg/dL — ABNORMAL HIGH (ref 70–99)
Glucose-Capillary: 214 mg/dL — ABNORMAL HIGH (ref 70–99)

## 2021-10-07 MED ORDER — HYDRALAZINE HCL 20 MG/ML IJ SOLN: 10.0000 mg | Freq: Four times a day (QID) | INTRAMUSCULAR | Status: DC | PRN

## 2021-10-07 MED ORDER — PREDNISONE 20 MG PO TABS
50.0000 mg | ORAL_TABLET | Freq: Every day | ORAL | Status: DC
  Administered 2021-10-08: 50 mg via ORAL
  Filled 2021-10-07: qty 1

## 2021-10-07 NOTE — Progress Notes (Deleted)
Pt states that he will let nursing assess dressing a little later on this morning.

## 2021-10-07 NOTE — TOC Progression Note (Addendum)
Transition of Care [facility]) - Progression Note    Patient Details  Name: Kathryn Lewis MRN: [id number] Date of Birth: 1946/01/09  Transition of Care [facility]) CM/SW [place], [place] Phone Number: 10/07/2021, 1:27 PM  Clinical Narrative:      Update-4:15pm Lisa with [facility] confirmed patient can return back to ALF when medically ready for dc.  CSW called [facility] and spoke with [facility]. Magnoila confirmed she will have [name] give CSW call back. CSW calling to follow up on status of patient being able to return to ALF when medically ready for dc.CSW will continue to follow and assist with patients dc planning needs.  Expected Discharge Plan: Assisted Living Barriers to Discharge: Continued Medical Work up  Expected Discharge Plan and Services Expected Discharge Plan: Assisted Living In-house Referral: Clinical Social Work     Living arrangements for the past 2 months: [place] (From [facility])                                       Social Determinants of Health (SDOH) Interventions    Readmission Risk Interventions     No data to display

## 2021-10-07 NOTE — Progress Notes (Signed)
Mobility Specialist - Progress Note   10/07/21 1500  Mobility  Activity Ambulated with assistance in hallway  Level of Assistance Contact guard assist, steadying assist  Assistive Device Front wheel walker  Distance Ambulated (ft) 160 ft  Activity Response Tolerated well  $Mobility charge 1 Mobility   Pt was received in bed and agreeable to mobility. No c/o pain throughout ambulation. Pt was retuned to bed with all needs met and bed alarm on.  Larey Seat

## 2021-10-07 NOTE — Progress Notes (Deleted)
Pt refused to let nursing take down and look at scrotal dressing, will ask pt again later to see if agreeable.

## 2021-10-07 NOTE — Progress Notes (Signed)
PROGRESS NOTE    Kathryn Lewis  WLN:[id number] DOB: Apr 09, 1945 DOA: 09/30/2021 PCP: [place], Betty G, MD   Brief Narrative: 76 year old with past medical history significant for dementia type 2 diabetes, hyperlipidemia, hypertension, CKD stage IIIb, CAD, peripheral artery disease, prior CVA, seizure disorder, OSA, COPD former smoker quit 2015 who presents complaining of productive cough.  She is a poor historian due to history of dementia.  CTA chest was negative for PE, lungs clear no pleural effusion.  She was found to have elevation of troponin which peaked to 304.  Cardiology was consulted and recommendation was to start heparin drip and blood pressure control.  Patient was found to have  blood pressure 206/64. Admitted with COPD exacerbation, slow to improved.   Assessment & Plan:   Principal Problem:   Elevated troponin Active Problems:   Seizure disorder as sequela of cerebrovascular accident ([place])   CAD (coronary artery disease)   Diabetes mellitus type 2 with neurological manifestations (HCC)   Type II diabetes mellitus with neurological manifestations (HCC)   CKD (chronic kidney disease) stage 3, GFR 30-59 ml/min (HCC)   Elevation of troponin Currently chest pain-free Cardiology consulted, likely secondary to demand ischemia in setting Bronchitis, COPD.  Received  Heparin gtt for 48 hours, discontinued Medical management.   Acute COPD exacerbation Completed 5 days of antibiotics Continue with  brovana, Pulmicort.  Flutter valve, guaifenesin.  S/p IV solumedrol--> will plan a slow taper S/P Hypertonic saline Nebulizer Chest PT  Hypertension uncontrolled Continue with Norvasc and metoprolol, imdur, aldactone Cozaar increase to 100 mg  Started  oral Hydralazine.   Metabolic acidosis;  ABG PH normal.  Improved with IV fluids. NSL.   Diabetes type 2 with hyperglycemia SSI, Accu-Cheks, hypoglycemic protocol  Hyperlipidemia Continue with Crestor  Chronic  diastolic heart failure Continue with spironolactone, Imdur  Seizure disorder Continue with Dilantin, Lamictal.  History of CVA Per daughter patient doesn't have history of dementia. She has had some confusion, mild after she had stroke.  Patient with confusion, likely Delirium.  B12: 441.   Chronic hyponatremia/chronic CKD 3B: Daily BMP   Estimated body mass index is 24.1 kg/m as calculated from the following:   Height as of this encounter: '5\' 3"'$  (1.6 m).   Weight as of this encounter: 61.7 kg.   DVT prophylaxis: Heparin [place] Code Status: DNR Family Communication: None at bedside Disposition Plan:  Status is: Inpatient Remains inpatient appropriate because: Level of care   Consultants:  Cardiology   Procedures:  None  Antimicrobials:    Subjective: Patient reports feeling better, denies any worsening shortness of breath, chest pain, nausea/vomiting, fever/chills.  Still noted to have cough  Objective: Vitals:   10/07/21 0816 10/07/21 0923 10/07/21 1100 10/07/21 1154  BP:  (!) 193/106  (!) 142/59  Pulse:  77  65  Resp:  18  17  Temp:   98 F (36.7 C) 98.5 F (36.9 C)  TempSrc:   Oral Oral  SpO2: 95% 100%  99%  Weight:      Height:        Intake/Output Summary (Last 24 hours) at 10/07/2021 1923 Last data filed at 10/07/2021 0030 Gross per 24 hour  Intake --  Output 700 ml  Net -700 ml   Filed Weights   09/30/21 1659  Weight: 61.7 kg    Examination: General: NAD  Cardiovascular: S1, S2 present Respiratory: CTAB Abdomen: Soft, nontender, nondistended, bowel sounds present Musculoskeletal: No bilateral pedal edema noted Skin: Normal Psychiatry: Normal  mood     Data Reviewed: I have personally reviewed following labs and imaging studies  CBC: Recent Labs  Lab 09/30/21 1933 10/01/21 0736 10/03/21 0234 10/04/21 0817 10/05/21 0550  WBC  --  13.0* 12.7* 14.9* 11.3*  HGB 11.6* 10.8* 9.6* 10.2* 10.0*  HCT 34.0* 32.6* 28.3* 30.0* 30.2*  MCV   --  84.7 85.0 85.5 87.0  PLT  --  241 240 288 161   Basic Metabolic Panel: Recent Labs  Lab 10/02/21 0512 10/03/21 0234 10/04/21 0817 10/05/21 0550 10/06/21 0838 10/07/21 0427  NA 130* 132* 133* 133* 137 134*  K 4.3 4.4 3.8 4.5 3.8 4.7  CL 99 102 102 106 109 106  CO2 19* 19* 16* 18* 19* 20*  GLUCOSE 206* 106* 112* 312* 136* 149*  BUN '18 20 19 17 19 20  '$ CREATININE 0.97 0.97 1.04* 1.03* 1.07* 1.21*  CALCIUM 9.1 9.2 9.7 9.3 9.9 9.8  MG 2.0  --   --   --   --   --   PHOS 3.1  --   --   --   --   --    GFR: Estimated Creatinine Clearance: 33.2 mL/min (A) (by C-G formula based on SCr of 1.21 mg/dL (H)). Liver Function Tests: No results for input(s): "AST", "ALT", "ALKPHOS", "BILITOT", "PROT", "ALBUMIN" in the last 168 hours. No results for input(s): "LIPASE", "AMYLASE" in the last 168 hours. No results for input(s): "AMMONIA" in the last 168 hours. Coagulation Profile: No results for input(s): "INR", "PROTIME" in the last 168 hours. Cardiac Enzymes: No results for input(s): "CKTOTAL", "CKMB", "CKMBINDEX", "TROPONINI" in the last 168 hours. BNP (last 3 results) No results for input(s): "PROBNP" in the last 8760 hours. HbA1C: No results for input(s): "HGBA1C" in the last 72 hours.  CBG: Recent Labs  Lab 10/07/21 0011 10/07/21 0403 10/07/21 0758 10/07/21 1152 10/07/21 1609  GLUCAP 119* 144* 159* 214* 165*   Lipid Profile: No results for input(s): "CHOL", "HDL", "LDLCALC", "TRIG", "CHOLHDL", "LDLDIRECT" in the last 72 hours.  Thyroid Function Tests: No results for input(s): "TSH", "T4TOTAL", "FREET4", "T3FREE", "THYROIDAB" in the last 72 hours. Anemia Panel: No results for input(s): "VITAMINB12", "FOLATE", "FERRITIN", "TIBC", "IRON", "RETICCTPCT" in the last 72 hours.  Sepsis Labs: Recent Labs  Lab 10/02/21 0512  PROCALCITON 0.15    Recent Results (from the past 240 hour(s))  Resp Panel by RT-PCR (Flu A&B, Covid) Anterior Nasal Swab     Status: None    Collection Time: 09/30/21  6:11 PM   Specimen: Anterior Nasal Swab  Result Value Ref Range Status   SARS Coronavirus 2 by RT PCR NEGATIVE NEGATIVE Final    Comment: (NOTE) SARS-CoV-2 target nucleic acids are NOT DETECTED.  The SARS-CoV-2 RNA is generally detectable in upper respiratory specimens during the acute phase of infection. The lowest concentration of SARS-CoV-2 viral copies this assay can detect is 138 copies/mL. A negative result does not preclude SARS-Cov-2 infection and should not be used as the sole basis for treatment or other patient management decisions. A negative result may occur with  improper specimen collection/handling, submission of specimen other than nasopharyngeal swab, presence of viral mutation(s) within the areas targeted by this assay, and inadequate number of viral copies(<138 copies/mL). A negative result must be combined with clinical observations, patient history, and epidemiological information. The expected result is Negative.  Fact Sheet for Patients:  EntrepreneurPulse.com.au  Fact Sheet for Healthcare Providers:  IncredibleEmployment.be  This test is no t yet approved or cleared  by the [place] and  has been authorized for detection and/or diagnosis of SARS-CoV-2 by FDA under an Emergency Use Authorization (EUA). This EUA will remain  in effect (meaning this test can be used) for the duration of the COVID-19 declaration under Section 564(b)(1) of the Act, 21 U.S.C.section 360bbb-3(b)(1), unless the authorization is terminated  or revoked sooner.       Influenza A by PCR NEGATIVE NEGATIVE Final   Influenza B by PCR NEGATIVE NEGATIVE Final    Comment: (NOTE) The Xpert Xpress SARS-CoV-2/FLU/RSV plus assay is intended as an aid in the diagnosis of influenza from Nasopharyngeal swab specimens and should not be used as a sole basis for treatment. Nasal washings and aspirates are unacceptable for  Xpert Xpress SARS-CoV-2/FLU/RSV testing.  Fact Sheet for Patients: EntrepreneurPulse.com.au  Fact Sheet for Healthcare Providers: IncredibleEmployment.be  This test is not yet approved or cleared by the [place] FDA and has been authorized for detection and/or diagnosis of SARS-CoV-2 by FDA under an Emergency Use Authorization (EUA). This EUA will remain in effect (meaning this test can be used) for the duration of the COVID-19 declaration under Section 564(b)(1) of the Act, 21 U.S.C. section 360bbb-3(b)(1), unless the authorization is terminated or revoked.  Performed at [facility], [address] [place], [place] [address]          Radiology Studies: No results found.      Scheduled Meds:  amLODipine  10 mg Oral Daily   arformoterol  15 mcg Nebulization BID   aspirin EC  81 mg Oral Daily   budesonide (PULMICORT) nebulizer solution  0.25 mg Nebulization BID   clopidogrel  75 mg Oral Daily   ezetimibe  10 mg Oral Daily   guaiFENesin  1,200 mg Oral BID   heparin  5,000 Units Subcutaneous Q8H   hydrALAZINE  25 mg Oral Q8H   insulin aspart  0-9 Units Subcutaneous Q4H   insulin glargine-yfgn  15 Units Subcutaneous QHS   ipratropium-albuterol  3 mL Nebulization BID   isosorbide mononitrate  30 mg Oral Daily   lamoTRIgine  300 mg Oral BID   losartan  100 mg Oral Daily   methylPREDNISolone (SOLU-MEDROL) injection  40 mg Intravenous Q12H   metoprolol tartrate  50 mg Oral BID   phenytoin  30 mg Oral QHS   phenytoin  300 mg Oral Daily   rosuvastatin  40 mg Oral Daily   spironolactone  25 mg Oral Daily   umeclidinium bromide  1 puff Inhalation Daily   Continuous Infusions:     LOS: 6 days    Alma Friendly, MD [facility]   If 7PM-7AM, please contact night-coverage [email or web address]  10/07/2021, 7:23 PM

## 2021-10-08 ENCOUNTER — Inpatient Hospital Stay (HOSPITAL_COMMUNITY): Payer: HMO

## 2021-10-08 DIAGNOSIS — R778 Other specified abnormalities of plasma proteins: Secondary | ICD-10-CM | POA: Diagnosis not present

## 2021-10-08 DIAGNOSIS — N1831 Chronic kidney disease, stage 3a: Secondary | ICD-10-CM | POA: Diagnosis not present

## 2021-10-08 DIAGNOSIS — I25119 Atherosclerotic heart disease of native coronary artery with unspecified angina pectoris: Secondary | ICD-10-CM | POA: Diagnosis not present

## 2021-10-08 DIAGNOSIS — E1149 Type 2 diabetes mellitus with other diabetic neurological complication: Secondary | ICD-10-CM | POA: Diagnosis not present

## 2021-10-08 LAB — CBC WITH DIFFERENTIAL/PLATELET
Abs Immature Granulocytes: 0.9 10*3/uL — ABNORMAL HIGH (ref 0.00–0.07)
Basophils Absolute: 0 10*3/uL (ref 0.0–0.1)
Basophils Relative: 0 %
Eosinophils Absolute: 0 10*3/uL (ref 0.0–0.5)
Eosinophils Relative: 0 %
HCT: 31.6 % — ABNORMAL LOW (ref 36.0–46.0)
Hemoglobin: 10.4 g/dL — ABNORMAL LOW (ref 12.0–15.0)
Lymphocytes Relative: 13 %
Lymphs Abs: 2.2 10*3/uL (ref 0.7–4.0)
MCH: 28.7 pg (ref 26.0–34.0)
MCHC: 32.9 g/dL (ref 30.0–36.0)
MCV: 87.3 fL (ref 80.0–100.0)
Metamyelocytes Relative: 1 %
Monocytes Absolute: 1.2 10*3/uL — ABNORMAL HIGH (ref 0.1–1.0)
Monocytes Relative: 7 %
Myelocytes: 3 %
Neutro Abs: 13 10*3/uL — ABNORMAL HIGH (ref 1.7–7.7)
Neutrophils Relative %: 75 %
Platelets: 319 10*3/uL (ref 150–400)
Promyelocytes Relative: 1 %
RBC: 3.62 MIL/uL — ABNORMAL LOW (ref 3.87–5.11)
RDW: 15 % (ref 11.5–15.5)
WBC: 17.3 10*3/uL — ABNORMAL HIGH (ref 4.0–10.5)
nRBC: 0 /100 WBC
nRBC: 0.1 % (ref 0.0–0.2)

## 2021-10-08 LAB — BASIC METABOLIC PANEL
Anion gap: 5 (ref 5–15)
BUN: 28 mg/dL — ABNORMAL HIGH (ref 8–23)
CO2: 23 mmol/L (ref 22–32)
Calcium: 9.8 mg/dL (ref 8.9–10.3)
Chloride: 108 mmol/L (ref 98–111)
Creatinine, Ser: 1.43 mg/dL — ABNORMAL HIGH (ref 0.44–1.00)
GFR, Estimated: 38 mL/min — ABNORMAL LOW (ref >60)
Glucose, Bld: 130 mg/dL — ABNORMAL HIGH (ref 70–99)
Potassium: 4.7 mmol/L (ref 3.5–5.1)
Sodium: 136 mmol/L (ref 135–145)

## 2021-10-08 LAB — GLUCOSE, CAPILLARY
Glucose-Capillary: 110 mg/dL — ABNORMAL HIGH (ref 70–99)
Glucose-Capillary: 111 mg/dL — ABNORMAL HIGH (ref 70–99)
Glucose-Capillary: 144 mg/dL — ABNORMAL HIGH (ref 70–99)
Glucose-Capillary: 269 mg/dL — ABNORMAL HIGH (ref 70–99)
Glucose-Capillary: 369 mg/dL — ABNORMAL HIGH (ref 70–99)
Glucose-Capillary: 99 mg/dL (ref 70–99)

## 2021-10-08 MED ORDER — PREDNISONE 20 MG PO TABS
40.0000 mg | ORAL_TABLET | Freq: Every day | ORAL | Status: DC
  Administered 2021-10-09: 40 mg via ORAL
  Filled 2021-10-08: qty 2

## 2021-10-08 NOTE — Progress Notes (Signed)
Pt was found by floor RN calling for help. RN found pt sitting down on floor in bathroom. Pt states that she is okay and that she had hit her head. Provider Cyndia Diver notified , CT ordered . Daughter notified. Vitals stable , bed alarm set. Call bell within reach on right side of pt. Pt has visual loss on left side from previous stroke.   10/08/21 0310  What Happened  Was fall witnessed? No  Was patient injured? No  Patient found on floor;in bathroom  Found by Staff-comment Jonathon Resides RN)  Stated prior activity to/from bed, chair, or stretcher  Follow Up  MD notified Hal Hope  Time MD notified 0230  Family notified Yes - comment (Daughter)  Time family notified 0320  Additional tests Yes-comment (CT of head)  Progress note created (see row info) Yes  Adult Fall Risk Assessment  Risk Factor Category (scoring not indicated) High fall risk per protocol (document High fall risk);Fall has occurred during this admission (document High fall risk)  Patient Fall Risk Level High fall risk  Adult Fall Risk Interventions  Required Bundle Interventions *See Row Information* High fall risk - low, moderate, and high requirements implemented  Additional Interventions Use of appropriate toileting equipment (bedpan, BSC, etc.);Room near [place] for Fall Injury Risk (To be completed on HIGH fall risk patients) - Assessing Need for Floor Mats  Risk For Fall Injury- Criteria for Floor Mats Confusion/dementia (+NuDESC, CIWA, TBI, etc.)  Will Implement Floor Mats Yes

## 2021-10-08 NOTE — Progress Notes (Signed)
Mobility Specialist Criteria Algorithm Info.   10/08/21 1400  Mobility  Activity Ambulated with assistance in hallway  Range of Motion/Exercises Active;All extremities  Level of Assistance Standby assist, set-up cues, supervision of patient - no hands on  Assistive Device Front wheel walker  Distance Ambulated (ft) 380 ft  Activity Response Tolerated well   Patient received in recliner agreeable to participate in mobility. Ambulated in min guard to supervision with slow steady gait. Returned to room without complaint or incident. Was left in recliner with all needs met, call bell in reach.   Martin[place] Gearline Spilman, [place], [place]  NSHPH:[phone] Office: [phone]

## 2021-10-08 NOTE — Progress Notes (Signed)
Occupational Therapy Treatment Patient Details Name: Kathryn Lewis MRN: [id number] DOB: 16-Dec-1945 Today's Date: 10/08/2021   History of present illness Patient is a 76 y/o female who presents on 09/30/21 with SOB and cough. CTA chest  negative for PE and unequivocal for PNA. Workup for acute COPD exacerbation. Elevated troponin suspect due to demand ischemia. PMH includes CAD, PAD, CVA, MI, bipolar, CKD, OSA, depression, DM. (Simultaneous filing. User may not have seen previous data.)   OT comments  Patient received in supine and agreeable to OT session. Patient able to get to EOB with HOB raised and use of rails. Patient was min guard and verbal cues to ambulate to sink and was able to perform self care tasks standing with min guard assist and one seated rest break. Patient ambulated to recliner with verbal cues for safety and bathed feet and changed socks seated. Patient has made good gains and will continue to be followed by acute OT while in this setting. Discharge recommendations continues to be appropriate.    Recommendations for follow up therapy are one component of a multi-disciplinary discharge planning process, led by the attending physician.  Recommendations may be updated based on patient status, additional functional criteria and insurance authorization.    Follow Up Recommendations  Home health OT    Assistance Recommended at Discharge Frequent or constant Supervision/Assistance  Patient can return home with the following  A little help with walking and/or transfers;A little help with bathing/dressing/bathroom;Assistance with cooking/housework;Help with stairs or ramp for entrance;Assist for transportation;Direct supervision/assist for medications management;Direct supervision/assist for financial management   Equipment Recommendations  None recommended by OT    Recommendations for Other Services      Precautions / Restrictions Precautions Precautions: Fall (Simultaneous  filing. User may not have seen previous data.) Restrictions Weight Bearing Restrictions: No (Simultaneous filing. User may not have seen previous data.)       Mobility Bed Mobility Overal bed mobility: Needs Assistance (Simultaneous filing. User may not have seen previous data.) Bed Mobility: Supine to Sit     Supine to sit: Supervision, HOB elevated     General bed mobility comments: use of bed rail (Simultaneous filing. User may not have seen previous data.)    Transfers Overall transfer level: Needs assistance (Simultaneous filing. User may not have seen previous data.) Equipment used: Rolling walker (2 wheels) (Simultaneous filing. User may not have seen previous data.) Transfers: Sit to/from Stand, Bed to chair/wheelchair/BSC (Simultaneous filing. User may not have seen previous data.) Sit to Stand: Min guard (Simultaneous filing. User may not have seen previous data.)           General transfer comment: min guard to ambulate to sink and perform self care. Transferred to recliner witih min guard and verbal cues for safety (Simultaneous filing. User may not have seen previous data.)     Balance Overall balance assessment: Needs assistance (Simultaneous filing. User may not have seen previous data.) Sitting-balance support: Feet supported, No upper extremity supported (Simultaneous filing. User may not have seen previous data.) Sitting balance-Leahy Scale: Fair (Simultaneous filing. User may not have seen previous data.)     Standing balance support: During functional activity, Bilateral upper extremity supported, No upper extremity supported (Simultaneous filing. User may not have seen previous data.) Standing balance-Leahy Scale: Fair (Simultaneous filing. User may not have seen previous data.) Standing balance comment: able to stand at sink to perform self care tasks with one seated rest break (Simultaneous filing. User may not have seen  previous data.)                            ADL either performed or assessed with clinical judgement   ADL Overall ADL's : Needs assistance/impaired     Grooming: Wash/dry hands;Wash/dry face;Oral care;Applying deodorant;Brushing hair;Standing;Min guard Grooming Details (indicate cue type and reason): min guard to stand at sink for grooming tasks Upper Body Bathing: Set up;Sitting Upper Body Bathing Details (indicate cue type and reason): at sink Lower Body Bathing: Min guard;Sit to/from stand Lower Body Bathing Details (indicate cue type and reason): standing at sink for peri care. Bathed feet seated     Lower Body Dressing: Supervision/safety;Sitting/lateral leans Lower Body Dressing Details (indicate cue type and reason): doffed and donned socks seated               General ADL Comments: Patient was min guard when standing for self care    Extremity/Trunk Assessment              Vision       Perception     Praxis      Cognition Arousal/Alertness: Awake/alert (Simultaneous filing. User may not have seen previous data.) Behavior During Therapy: [facility] for tasks assessed/performed (Simultaneous filing. User may not have seen previous data.) Overall Cognitive Status: History of cognitive impairments - at baseline (Simultaneous filing. User may not have seen previous data.)                                 General Comments: Per chart review and PT assessment: per daughter, pt is at cognitive baseline, "mixes up words sometimes" and has poor memory. Sundowns at baseline. Is sarcastic and jokes a lot. (Simultaneous filing. User may not have seen previous data.)        Exercises      Shoulder Instructions       General Comments pt endorses SOB and coughing with mobility, SpO2 94% on RA    Pertinent Vitals/ Pain       Pain Assessment Pain Assessment: Faces (Simultaneous filing. User may not have seen previous data.) Faces Pain Scale: Hurts a little bit (Simultaneous filing.  User may not have seen previous data.) Pain Location: R-side abdomen (Simultaneous filing. User may not have seen previous data.) Pain Descriptors / Indicators: Sore (Simultaneous filing. User may not have seen previous data.) Pain Intervention(s): Monitored during session, Repositioned (Simultaneous filing. User may not have seen previous data.)  Home Living                                          Prior Functioning/Environment              Frequency  Min 2X/week        Progress Toward Goals  OT Goals(current goals can now be found in the care plan section)  Progress towards OT goals: Progressing toward goals  Acute Rehab OT Goals Patient Stated Goal: go home OT Goal Formulation: With patient Time For Goal Achievement: 10/20/21 Potential to Achieve Goals: Fair ADL Goals Pt Will Perform Grooming: with modified independence;sitting Pt Will Perform Lower Body Dressing: with supervision;sitting/lateral leans;sit to/from stand Pt Will Transfer to Toilet: with modified independence;ambulating;bedside commode Pt Will Perform Toileting - Clothing Manipulation and hygiene: with modified independence;sitting/lateral leans  Plan Discharge plan remains appropriate    Co-evaluation                 AM-PAC OT "6 Clicks" Daily Activity     Outcome Measure   Help from another person eating meals?: A Little Help from another person taking care of personal grooming?: A Little Help from another person toileting, which includes using toliet, bedpan, or urinal?: A Little Help from another person bathing (including washing, rinsing, drying)?: A Little Help from another person to put on and taking off regular upper body clothing?: A Little Help from another person to put on and taking off regular lower body clothing?: A Little 6 Click Score: 18    End of Session Equipment Utilized During Treatment: Rolling walker (2 wheels)  OT Visit Diagnosis: Unsteadiness on  feet (R26.81);Other abnormalities of gait and mobility (R26.89)   Activity Tolerance Patient tolerated treatment well   Patient Left in chair;with call bell/phone within reach;with chair alarm set   Nurse Communication Mobility status        Time: 3762-8315 OT Time Calculation (min): 28 min  Charges: OT General Charges $OT Visit: 1 Visit OT Treatments $Self Care/Home Management : 23-37 mins  Lodema Hong, [place]  Office [place] 10/08/2021, 12:09 PM

## 2021-10-08 NOTE — Progress Notes (Signed)
PROGRESS NOTE    Kathryn Lewis  XIP:[id number] DOB: November 05, 1945 DOA: 09/30/2021 PCP: [place], Betty G, MD   Brief Narrative: 76 year old with past medical history significant for dementia type 2 diabetes, hyperlipidemia, hypertension, CKD stage IIIb, CAD, peripheral artery disease, prior CVA, seizure disorder, OSA, COPD former smoker quit 2015 who presents complaining of productive cough.  She is a poor historian due to history of dementia.  CTA chest was negative for PE, lungs clear no pleural effusion.  She was found to have elevation of troponin which peaked to 304.  Cardiology was consulted and recommendation was to start heparin drip and blood pressure control.  Patient was found to have  blood pressure 206/64. Admitted with COPD exacerbation, slow to improved.   Assessment & Plan:   Principal Problem:   Elevated troponin Active Problems:   Seizure disorder as sequela of cerebrovascular accident ([place])   CAD (coronary artery disease)   Diabetes mellitus type 2 with neurological manifestations (HCC)   Type II diabetes mellitus with neurological manifestations (HCC)   CKD (chronic kidney disease) stage 3, GFR 30-59 ml/min (HCC)   Elevation of troponin Currently chest pain-free Cardiology consulted, likely secondary to demand ischemia in setting Bronchitis, COPD.  Received  Heparin gtt for 48 hours, discontinued Medical management.   Acute COPD exacerbation Completed 5 days of antibiotics Continue with  brovana, Pulmicort.  Flutter valve, guaifenesin.  S/p IV solumedrol--> will plan a slow taper (noted leukocytosis) S/P Hypertonic saline Nebulizer Chest PT  Hypertension uncontrolled Continue with Norvasc and metoprolol, imdur, aldactone Cozaar increase to 100 mg  Started  oral Hydralazine  Diabetes type 2 with hyperglycemia SSI, Accu-Cheks, hypoglycemic protocol  Hyperlipidemia Continue with Crestor  Chronic diastolic heart failure Continue with spironolactone,  Imdur  Seizure disorder Continue with Dilantin, Lamictal.  History of CVA Per daughter patient doesn't have history of dementia. She has had some confusion, mild after she had stroke.  Patient with confusion, likely Delirium.   AKI on chronic CKD 3B Mild bump in Cr Daily BMP   Estimated body mass index is 24.1 kg/m as calculated from the following:   Height as of this encounter: '5\' 3"'$  (1.6 m).   Weight as of this encounter: 61.7 kg.   DVT prophylaxis: Heparin [place] Code Status: DNR Family Communication: None at bedside Disposition Plan:  Status is: Inpatient Remains inpatient appropriate because: Level of care   Consultants:  Cardiology   Procedures:  None  Antimicrobials:    Subjective: Patient reports feeling better, denies any worsening shortness of breath, chest pain, nausea/vomiting, fever/chills.  Still noted to have cough.  Patient was made on the floor in the bathroom overnight, reported hitting head.  CT head unremarkable.  Denies any new complaints this morning.  Objective: Vitals:   10/08/21 0820 10/08/21 0831 10/08/21 1159 10/08/21 1628  BP:  (!) 166/70 (!) 145/115 (!) 182/61  Pulse:  78 71 82  Resp:  '18 15 16  '$ Temp:  98.1 F (36.7 C) 97.8 F (36.6 C) 97.8 F (36.6 C)  TempSrc:  Oral Oral Oral  SpO2: 96% 100% 98% 97%  Weight:      Height:        Intake/Output Summary (Last 24 hours) at 10/08/2021 1658 Last data filed at 10/08/2021 1500 Gross per 24 hour  Intake 120 ml  Output 700 ml  Net -580 ml   Filed Weights   09/30/21 1659  Weight: 61.7 kg    Examination: General: NAD  Cardiovascular: S1, S2 present  Respiratory: Diminished breath sounds bilaterally Abdomen: Soft, nontender, nondistended, bowel sounds present Musculoskeletal: No bilateral pedal edema noted Skin: Normal Psychiatry: Normal mood     Data Reviewed: I have personally reviewed following labs and imaging studies  CBC: Recent Labs  Lab 10/03/21 0234 10/04/21 0817  10/05/21 0550 10/08/21 0224  WBC 12.7* 14.9* 11.3* 17.3*  NEUTROABS  --   --   --  13.0*  HGB 9.6* 10.2* 10.0* 10.4*  HCT 28.3* 30.0* 30.2* 31.6*  MCV 85.0 85.5 87.0 87.3  PLT 240 288 290 594   Basic Metabolic Panel: Recent Labs  Lab 10/02/21 0512 10/03/21 0234 10/04/21 0817 10/05/21 0550 10/06/21 0838 10/07/21 0427 10/08/21 0224  NA 130*   < > 133* 133* 137 134* 136  K 4.3   < > 3.8 4.5 3.8 4.7 4.7  CL 99   < > 102 106 109 106 108  CO2 19*   < > 16* 18* 19* 20* 23  GLUCOSE 206*   < > 112* 312* 136* 149* 130*  BUN 18   < > '19 17 19 20 '$ 28*  CREATININE 0.97   < > 1.04* 1.03* 1.07* 1.21* 1.43*  CALCIUM 9.1   < > 9.7 9.3 9.9 9.8 9.8  MG 2.0  --   --   --   --   --   --   PHOS 3.1  --   --   --   --   --   --    < > = values in this interval not displayed.   GFR: Estimated Creatinine Clearance: 28.1 mL/min (A) (by C-G formula based on SCr of 1.43 mg/dL (H)). Liver Function Tests: No results for input(s): "AST", "ALT", "ALKPHOS", "BILITOT", "PROT", "ALBUMIN" in the last 168 hours. No results for input(s): "LIPASE", "AMYLASE" in the last 168 hours. No results for input(s): "AMMONIA" in the last 168 hours. Coagulation Profile: No results for input(s): "INR", "PROTIME" in the last 168 hours. Cardiac Enzymes: No results for input(s): "CKTOTAL", "CKMB", "CKMBINDEX", "TROPONINI" in the last 168 hours. BNP (last 3 results) No results for input(s): "PROBNP" in the last 8760 hours. HbA1C: No results for input(s): "HGBA1C" in the last 72 hours.  CBG: Recent Labs  Lab 10/08/21 0002 10/08/21 0503 10/08/21 0811 10/08/21 1156 10/08/21 1625  GLUCAP 111* 110* 99 144* 369*   Lipid Profile: No results for input(s): "CHOL", "HDL", "LDLCALC", "TRIG", "CHOLHDL", "LDLDIRECT" in the last 72 hours.  Thyroid Function Tests: No results for input(s): "TSH", "T4TOTAL", "FREET4", "T3FREE", "THYROIDAB" in the last 72 hours. Anemia Panel: No results for input(s): "VITAMINB12", "FOLATE",  "FERRITIN", "TIBC", "IRON", "RETICCTPCT" in the last 72 hours.  Sepsis Labs: Recent Labs  Lab 10/02/21 0512  PROCALCITON 0.15    Recent Results (from the past 240 hour(s))  Resp Panel by RT-PCR (Flu A&B, Covid) Anterior Nasal Swab     Status: None   Collection Time: 09/30/21  6:11 PM   Specimen: Anterior Nasal Swab  Result Value Ref Range Status   SARS Coronavirus 2 by RT PCR NEGATIVE NEGATIVE Final    Comment: (NOTE) SARS-CoV-2 target nucleic acids are NOT DETECTED.  The SARS-CoV-2 RNA is generally detectable in upper respiratory specimens during the acute phase of infection. The lowest concentration of SARS-CoV-2 viral copies this assay can detect is 138 copies/mL. A negative result does not preclude SARS-Cov-2 infection and should not be used as the sole basis for treatment or other patient management decisions. A negative result may occur with  improper specimen collection/handling, submission of specimen other than nasopharyngeal swab, presence of viral mutation(s) within the areas targeted by this assay, and inadequate number of viral copies(<138 copies/mL). A negative result must be combined with clinical observations, patient history, and epidemiological information. The expected result is Negative.  Fact Sheet for Patients:  EntrepreneurPulse.com.au  Fact Sheet for Healthcare Providers:  IncredibleEmployment.be  This test is no t yet approved or cleared by the [place] FDA and  has been authorized for detection and/or diagnosis of SARS-CoV-2 by FDA under an Emergency Use Authorization (EUA). This EUA will remain  in effect (meaning this test can be used) for the duration of the COVID-19 declaration under Section 564(b)(1) of the Act, 21 U.S.C.section 360bbb-3(b)(1), unless the authorization is terminated  or revoked sooner.       Influenza A by PCR NEGATIVE NEGATIVE Final   Influenza B by PCR NEGATIVE NEGATIVE Final     Comment: (NOTE) The Xpert Xpress SARS-CoV-2/FLU/RSV plus assay is intended as an aid in the diagnosis of influenza from Nasopharyngeal swab specimens and should not be used as a sole basis for treatment. Nasal washings and aspirates are unacceptable for Xpert Xpress SARS-CoV-2/FLU/RSV testing.  Fact Sheet for Patients: EntrepreneurPulse.com.au  Fact Sheet for Healthcare Providers: IncredibleEmployment.be  This test is not yet approved or cleared by the [place] FDA and has been authorized for detection and/or diagnosis of SARS-CoV-2 by FDA under an Emergency Use Authorization (EUA). This EUA will remain in effect (meaning this test can be used) for the duration of the COVID-19 declaration under Section 564(b)(1) of the Act, 21 U.S.C. section 360bbb-3(b)(1), unless the authorization is terminated or revoked.  Performed at [facility], [address], [place], [place] [address]          Radiology Studies: CT HEAD WO CONTRAST (5MM)  Result Date: 10/08/2021 CLINICAL DATA:  Head trauma, minor. EXAM: CT HEAD WITHOUT CONTRAST TECHNIQUE: Contiguous axial images were obtained from the base of the skull through the vertex without intravenous contrast. RADIATION DOSE REDUCTION: This exam was performed according to the departmental dose-optimization program which includes automated exposure control, adjustment of the mA and/or kV according to patient size and/or use of iterative reconstruction technique. COMPARISON:  06/11/2021. FINDINGS: Brain: No acute intracranial hemorrhage, midline shift or mass effect. No extra-axial fluid collection. There is stable encephalomalacia in the PCA territory on the right. Periventricular white matter hypodensities are noted bilaterally. Ventricles are stable in size and configuration. Vascular: Atherosclerotic calcification of the carotid siphons and vertebral arteries. No hyperdense vessel. Skull:  Normal. Negative for fracture or focal lesion. Sinuses/Orbits: Mucosal thickening is present in the right frontal sinus, left maxillary sinus, and right ethmoid air cells. No acute orbital abnormality. Other: None. IMPRESSION: 1. No acute intracranial process. 2. Atrophy with chronic microvascular ischemic changes and old infarct in the PCA territory on the right. Electronically Signed   By: Brett Fairy M.D.   On: 10/08/2021 05:01        Scheduled Meds:  amLODipine  10 mg Oral Daily   arformoterol  15 mcg Nebulization BID   aspirin EC  81 mg Oral Daily   budesonide (PULMICORT) nebulizer solution  0.25 mg Nebulization BID   clopidogrel  75 mg Oral Daily   ezetimibe  10 mg Oral Daily   guaiFENesin  1,200 mg Oral BID   heparin  5,000 Units Subcutaneous Q8H   hydrALAZINE  25 mg Oral Q8H   insulin aspart  0-9 Units Subcutaneous Q4H  insulin glargine-yfgn  15 Units Subcutaneous QHS   ipratropium-albuterol  3 mL Nebulization BID   isosorbide mononitrate  30 mg Oral Daily   lamoTRIgine  300 mg Oral BID   losartan  100 mg Oral Daily   metoprolol tartrate  50 mg Oral BID   phenytoin  30 mg Oral QHS   phenytoin  300 mg Oral Daily   predniSONE  50 mg Oral Q breakfast   rosuvastatin  40 mg Oral Daily   spironolactone  25 mg Oral Daily   umeclidinium bromide  1 puff Inhalation Daily   Continuous Infusions:     LOS: 7 days    Alma Friendly, MD [facility]   If 7PM-7AM, please contact night-coverage [email or web address]  10/08/2021, 4:58 PM

## 2021-10-08 NOTE — Plan of Care (Signed)

## 2021-10-08 NOTE — Care Management Important Message (Signed)
Important Message  Patient Details  Name: Kathryn Lewis MRN: [id number] Date of Birth: 1946/01/16   Medicare Important Message Given:  Yes     [name] 10/08/2021, 11:47 AM

## 2021-10-08 NOTE — Plan of Care (Signed)

## 2021-10-08 NOTE — Progress Notes (Signed)
Physical Therapy Treatment Patient Details Name: Kathryn Lewis MRN: [id number] DOB: [birth year] Today's Date: 10/08/2021   History of Present Illness Patient is a 76 y/o female who presents on 09/30/21 with SOB and cough. CTA chest  negative for PE and unequivocal for PNA. Workup for acute COPD exacerbation. Elevated troponin suspect due to demand ischemia. PMH includes CAD, PAD, CVA, MI, bipolar, CKD, OSA, depression, DM.    PT Comments    Pt received OOB in recliner and agreeable to session with good progress towards acute goals. Pt needing min guard throughout for transfers to standing with and without DME with pt able to complete 5x STS at end of session with x2 pushing from recliner arms and x3 without UE support. Pt continues to require up to min assist with RW during ambulation for navigation and safety as pt with L visual field deficits. Encouraged pt to look left and to increase awareness of surroundings with fair return, as well as discussed strategies for ambulation in hall to increase safety. Pt receptive and verbalizing understanding of education re; importance of continued mobility, energy conservation strategies and activity recommendations. Pt continues to benefit from skilled PT services to progress toward functional mobility goals.    Recommendations for follow up therapy are one component of a multi-disciplinary discharge planning process, led by the attending physician.  Recommendations may be updated based on patient status, additional functional criteria and insurance authorization.  Follow Up Recommendations  Home health PT     Assistance Recommended at Discharge Frequent or constant Supervision/Assistance  Patient can return home with the following A little help with walking and/or transfers;A little help with bathing/dressing/bathroom;Direct supervision/assist for financial management;Direct supervision/assist for medications management;Assistance with cooking/housework    Equipment Recommendations  None recommended by PT    Recommendations for Other Services       Precautions / Restrictions Precautions Precautions: Fall Restrictions Weight Bearing Restrictions: No     Mobility  Bed Mobility Overal bed mobility: Needs Assistance             General bed mobility comments: pt OOB in recliner pre and post session    Transfers Overall transfer level: Needs assistance Equipment used: Rolling walker (2 wheels) Transfers: Sit to/from Stand Sit to Stand: Min guard           General transfer comment: min guard to come to stand with RW, able to complete 5x STS at end of session without AD    Ambulation/Gait Ambulation/Gait assistance: Min guard, Min assist Gait Distance (Feet): 136 Feet Assistive device: Rolling walker (2 wheels) Gait Pattern/deviations: Step-through pattern, Decreased stride length, Trunk flexed Gait velocity: decreased     General Gait Details: slow gait with RW, no LOB, increased assist on L to navigate obstacles as pt with L visual field cut,   Stairs             Wheelchair Mobility    Modified Rankin (Stroke Patients Only)       Balance Overall balance assessment: Needs assistance Sitting-balance support: Feet supported, No upper extremity supported Sitting balance-Leahy Scale: Fair Sitting balance - Comments: Cannot reach down to feet or will fall over per report, hx of dizziness from prior CVA.   Standing balance support: During functional activity, Bilateral upper extremity supported, No upper extremity supported Standing balance-Leahy Scale: Fair Standing balance comment: able to static stand at recliner without UE supoprt or AD  Cognition Arousal/Alertness: Awake/alert Behavior During Therapy: WFL for tasks assessed/performed Overall Cognitive Status: History of cognitive impairments - at baseline                                 General  Comments: Per chart review and PT assessment: per daughter, pt is at cognitive baseline, "mixes up words sometimes" and has poor memory. Sundowns at baseline. Is sarcastic and jokes a lot.        Exercises Other Exercises Other Exercises: 5x STS from recliner without AD,( x3 with UE pushing off arms, x2 without UE support )    General Comments General comments (skin integrity, edema, etc.): pt endorses SOB and coughing with mobility, SpO2 94% on RA      Pertinent Vitals/Pain Pain Assessment Pain Assessment: Faces Faces Pain Scale: Hurts little more Pain Location: B hips Pain Descriptors / Indicators: Sore Pain Intervention(s): Limited activity within patient's tolerance, Monitored during session    Home Living                          Prior Function            PT Goals (current goals can now be found in the care plan section) Acute Rehab PT Goals Patient Stated Goal: to go home PT Goal Formulation: With patient Time For Goal Achievement: 10/19/21    Frequency    Min 3X/week      PT Plan Current plan remains appropriate    Co-evaluation              AM-PAC PT "6 Clicks" Mobility   Outcome Measure  Help needed turning from your back to your side while in a flat bed without using bedrails?: A Little Help needed moving from lying on your back to sitting on the side of a flat bed without using bedrails?: A Little Help needed moving to and from a bed to a chair (including a wheelchair)?: A Little Help needed standing up from a chair using your arms (e.g., wheelchair or bedside chair)?: A Little Help needed to walk in hospital room?: A Little Help needed climbing 3-5 steps with a railing? : A Lot 6 Click Score: 17    End of Session Equipment Utilized During Treatment: Gait belt Activity Tolerance: Patient tolerated treatment well;Patient limited by fatigue Patient left: in chair;with call bell/phone within reach;with chair alarm set Nurse  Communication: Mobility status PT Visit Diagnosis: Muscle weakness (generalized) (M62.81);Difficulty in walking, not elsewhere classified (R26.2);Unsteadiness on feet (R26.81)     Time: 6720-9470 PT Time Calculation (min) (ACUTE ONLY): 17 min  Charges:  $Therapeutic Activity: 8-22 mins                     Chiyeko Ferre R. PTA Acute Rehabilitation Services Office: [place] 10/08/2021, 12:42 PM

## 2021-10-09 DIAGNOSIS — I25119 Atherosclerotic heart disease of native coronary artery with unspecified angina pectoris: Secondary | ICD-10-CM | POA: Diagnosis not present

## 2021-10-09 DIAGNOSIS — N1831 Chronic kidney disease, stage 3a: Secondary | ICD-10-CM | POA: Diagnosis not present

## 2021-10-09 DIAGNOSIS — R778 Other specified abnormalities of plasma proteins: Secondary | ICD-10-CM | POA: Diagnosis not present

## 2021-10-09 DIAGNOSIS — I69398 Other sequelae of cerebral infarction: Secondary | ICD-10-CM | POA: Diagnosis not present

## 2021-10-09 LAB — CBC WITH DIFFERENTIAL/PLATELET
Abs Immature Granulocytes: 1.45 10*3/uL — ABNORMAL HIGH (ref 0.00–0.07)
Basophils Absolute: 0.1 10*3/uL (ref 0.0–0.1)
Basophils Relative: 1 %
Eosinophils Absolute: 0.1 10*3/uL (ref 0.0–0.5)
Eosinophils Relative: 1 %
HCT: 29.7 % — ABNORMAL LOW (ref 36.0–46.0)
Hemoglobin: 10.1 g/dL — ABNORMAL LOW (ref 12.0–15.0)
Immature Granulocytes: 9 %
Lymphocytes Relative: 12 %
Lymphs Abs: 2 10*3/uL (ref 0.7–4.0)
MCH: 28.8 pg (ref 26.0–34.0)
MCHC: 34 g/dL (ref 30.0–36.0)
MCV: 84.6 fL (ref 80.0–100.0)
Monocytes Absolute: 0.9 10*3/uL (ref 0.1–1.0)
Monocytes Relative: 6 %
Neutro Abs: 11.3 10*3/uL — ABNORMAL HIGH (ref 1.7–7.7)
Neutrophils Relative %: 71 %
Platelets: 292 10*3/uL (ref 150–400)
RBC: 3.51 MIL/uL — ABNORMAL LOW (ref 3.87–5.11)
RDW: 15.1 % (ref 11.5–15.5)
WBC: 15.8 10*3/uL — ABNORMAL HIGH (ref 4.0–10.5)
nRBC: 0 % (ref 0.0–0.2)

## 2021-10-09 LAB — BASIC METABOLIC PANEL
Anion gap: 8 (ref 5–15)
BUN: 31 mg/dL — ABNORMAL HIGH (ref 8–23)
CO2: 19 mmol/L — ABNORMAL LOW (ref 22–32)
Calcium: 9.9 mg/dL (ref 8.9–10.3)
Chloride: 107 mmol/L (ref 98–111)
Creatinine, Ser: 1.14 mg/dL — ABNORMAL HIGH (ref 0.44–1.00)
GFR, Estimated: 50 mL/min — ABNORMAL LOW (ref >60)
Glucose, Bld: 83 mg/dL (ref 70–99)
Potassium: 4.2 mmol/L (ref 3.5–5.1)
Sodium: 134 mmol/L — ABNORMAL LOW (ref 135–145)

## 2021-10-09 LAB — GLUCOSE, CAPILLARY
Glucose-Capillary: 103 mg/dL — ABNORMAL HIGH (ref 70–99)
Glucose-Capillary: 164 mg/dL — ABNORMAL HIGH (ref 70–99)
Glucose-Capillary: 286 mg/dL — ABNORMAL HIGH (ref 70–99)
Glucose-Capillary: 91 mg/dL (ref 70–99)
Glucose-Capillary: 96 mg/dL (ref 70–99)

## 2021-10-09 MED ORDER — BUDESONIDE-FORMOTEROL FUMARATE 160-4.5 MCG/ACT IN AERO: 2.0000 | INHALATION_SPRAY | Freq: Two times a day (BID) | RESPIRATORY_TRACT | 0 refills | Status: DC

## 2021-10-09 MED ORDER — HYDROCODONE-ACETAMINOPHEN 5-325 MG PO TABS: 1.0000 | ORAL_TABLET | Freq: Four times a day (QID) | ORAL | 0 refills | Status: AC | PRN

## 2021-10-09 MED ORDER — AMLODIPINE BESYLATE 10 MG PO TABS: 10.0000 mg | ORAL_TABLET | Freq: Every day | ORAL | 0 refills | Status: DC

## 2021-10-09 MED ORDER — HYDRALAZINE HCL 25 MG PO TABS: 25.0000 mg | ORAL_TABLET | Freq: Three times a day (TID) | ORAL | 0 refills | Status: DC

## 2021-10-09 MED ORDER — PREDNISONE 20 MG PO TABS: ORAL_TABLET | ORAL | 0 refills | Status: DC

## 2021-10-09 MED ORDER — IPRATROPIUM-ALBUTEROL 0.5-2.5 (3) MG/3ML IN SOLN: 3.0000 mL | Freq: Two times a day (BID) | RESPIRATORY_TRACT | 0 refills | Status: DC

## 2021-10-09 NOTE — Progress Notes (Signed)
Mobility Specialist - Progress Note   10/09/21 1427  Mobility  Activity Ambulated with assistance in room  Level of Assistance Standby assist, set-up cues, supervision of patient - no hands on  Assistive Device Front wheel walker  Distance Ambulated (ft) 16 ft  Activity Response Tolerated well  $Mobility charge 1 Mobility   Pt was received in bed and requesting to use BR. Pt c/o leg pain during ambulation. Pt was returned to bed with all needs met and bed alarm on.   Larey Seat

## 2021-10-09 NOTE — Progress Notes (Signed)
Mobility Specialist - Progress Note   10/09/21 1041  Mobility  Activity Ambulated with assistance in hallway  Level of Assistance Standby assist, set-up cues, supervision of patient - no hands on  Assistive Device Front wheel walker  Distance Ambulated (ft) 160 ft  Activity Response Tolerated well  $Mobility charge 1 Mobility    During mobility: 98% SpO2 Post-mobility:68 HR, 100%Sp O2  Pt was received in bed and agreeable to mobility. Pt c/o leg pain throughout ambulation. Pt was returned to bed with all needs met and bed alarm on.   Larey Seat

## 2021-10-09 NOTE — Discharge Summary (Addendum)
Physician Discharge Summary   Patient: Kathryn Lewis MRN: [id number] DOB: 09-22-1945  Admit date:     09/30/2021  Discharge date: 10/09/21  Discharge Physician: Alma Friendly   PCP: Martinique, Betty G, MD   Recommendations at discharge:   Follow-up with PCP in 1 week  Discharge Diagnoses: Principal Problem:   Elevated troponin Active Problems:   Seizure disorder as sequela of cerebrovascular accident ([place])   CAD (coronary artery disease)   Diabetes mellitus type 2 with neurological manifestations (HCC)   Type II diabetes mellitus with neurological manifestations (HCC)   CKD (chronic kidney disease) stage 3, GFR 30-59 ml/min [facility])    Hospital Course: 76 year old with past medical history significant for dementia type 2 diabetes, hyperlipidemia, hypertension, CKD stage IIIb, CAD, peripheral artery disease, prior CVA, seizure disorder, OSA, COPD former smoker quit 2015 who presents complaining of productive cough.  She is a poor historian due to history of dementia.  CTA chest was negative for PE, lungs clear no pleural effusion.  She was found to have elevation of troponin which peaked to 304.  Cardiology was consulted and recommendation was to start heparin drip and blood pressure control.  Patient was found to have  blood pressure 206/64. Admitted with COPD exacerbation.   Today, patient denies any new complaints.  Stable to discharge to ALF  Assessment and Plan:  Elevation of troponin Currently chest pain-free Cardiology consulted, likely secondary to demand ischemia in setting Bronchitis, COPD Received Heparin gtt for 48 hours, discontinued Medical management.    Acute COPD exacerbation Completed 5 days of antibiotics Continue with nebulizers, inhalers Flutter valve, guaifenesin.  S/p IV solumedrol--> discharge on a slow taper of prednisone   Hypertension uncontrolled Continue with increased dose of Norvasc, metoprolol, imdur, aldactone and recently started on  hydralazine May increase Cozaar to 100 mg if BP remains uncontrolled   Diabetes type 2 with hyperglycemia Continue prior home regimen   Hyperlipidemia Continue with Crestor   Chronic diastolic heart failure Continue with spironolactone, Imdur   Seizure disorder Continue with Dilantin, Lamictal.   History of CVA Per daughter patient doesn't have history of dementia. She has had some confusion, mild after she had stroke.    AKI on chronic CKD 3B Creatinine stable  PT/OT Eval and treat       Consultants: Cardiology Procedures performed: None Disposition:  Assisted living facility Diet recommendation:  Cardiac, moderate carbs   DISCHARGE MEDICATION: Allergies as of 10/09/2021       Reactions   Amoxicillin Other (See Comments)   colitis   Augmentin [amoxicillin-pot Clavulanate] Other (See Comments)   colitis   Cephalexin Other (See Comments)   Colitis - ceftriaxone ok        Medication List     TAKE these medications    albuterol 108 (90 Base) MCG/ACT inhaler Commonly known as: VENTOLIN HFA Inhale 2 puffs into the lungs every 6 (six) hours as needed for wheezing or shortness of breath.   amLODipine 10 MG tablet Commonly known as: NORVASC Take 1 tablet (10 mg total) by mouth daily. Start taking on: October 10, 2021 What changed:  medication strength how much to take   Aspirin Low Dose 81 MG tablet Generic drug: aspirin EC TAKE ONE TABLET BY MOUTH EVERY DAY   budesonide-formoterol 160-4.5 MCG/ACT inhaler Commonly known as: Symbicort Inhale 2 puffs into the lungs 2 (two) times daily.   clopidogrel 75 MG tablet Commonly known as: PLAVIX TAKE ONE TABLET BY MOUTH EVERY DAY  ezetimibe 10 MG tablet Commonly known as: ZETIA TAKE ONE TABLET BY MOUTH EVERY DAY   FeroSul 325 (65 FE) MG tablet Generic drug: ferrous sulfate TAKE ONE TABLET BY MOUTH EVERY TWO DAYS with FOUR OUNCE of orange JUICE   Fish Oil 1000 MG Caps TAKE TWO CAPSULES BY MOUTH  EVERY DAY   fluticasone 50 MCG/ACT nasal spray Commonly known as: FLONASE Place 2 sprays into both nostrils daily.   furosemide 20 MG tablet Commonly known as: LASIX Take 1 tablet (20 mg total) by mouth daily as needed for edema.   GNP PAIN RELIEF EX-STRENGTH 500 MG tablet Generic drug: acetaminophen TAKE ONE TABLET BY MOUTH FOUR TIMES DAILY   GNP Vitamin D Maximum Strength 50 MCG (2000 UT) Tabs Generic drug: Cholecalciferol TAKE ONE TABLET BY MOUTH EVERY DAY   guaiFENesin 600 MG 12 hr tablet Commonly known as: Mucinex Take 1 tablet (600 mg total) by mouth daily as needed for to loosen phlegm.   hydrALAZINE 25 MG tablet Commonly known as: APRESOLINE Take 1 tablet (25 mg total) by mouth every 8 (eight) hours.   HYDROcodone-acetaminophen 5-325 MG tablet Commonly known as: NORCO/VICODIN Take 1 tablet by mouth every 6 (six) hours as needed for up to 3 days for moderate pain.   Insulin Aspart FlexPen 100 UNIT/ML Commonly known as: NOVOLOG INJECT subcutaneously THREE TIMES DAILY PER sliding scales BLOOD SUGAR (150 NO INSULIN)+ 151-180 Give FOUR units. 181-220 Give SIX units, 221-260 Give EIGHT units, 261-300 Give 10 units 301-340 Give 12 units AND > 341 Give 14 units   ipratropium-albuterol 0.5-2.5 (3) MG/3ML Soln Commonly known as: DUONEB Take 3 mLs by nebulization 2 (two) times daily.   isosorbide mononitrate 30 MG 24 hr tablet Commonly known as: IMDUR TAKE ONE TABLET BY MOUTH EVERY DAY   lamoTRIgine 200 MG tablet Commonly known as: LAMICTAL TAKE 1.5 TABLETS BY MOUTH TWICE DAILY   Lantus SoloStar 100 UNIT/ML Solostar Pen Generic drug: insulin glargine INJECT 20 units AT BEDTIME   losartan 50 MG tablet Commonly known as: COZAAR TAKE ONE TABLET BY MOUTH EVERY DAY   metoprolol tartrate 50 MG tablet Commonly known as: LOPRESSOR TAKE ONE TABLET BY MOUTH TWICE DAILY   phenytoin 100 MG ER capsule Commonly known as: DILANTIN Take 3 capsules (300 mg total) by mouth  daily.   phenytoin 30 MG ER capsule Commonly known as: Dilantin Take 1 capsule (30 mg total) by mouth at bedtime.   polyvinyl alcohol 1.4 % ophthalmic solution Commonly known as: Artificial Tears Place 1 drop into both eyes in the morning and at bedtime.   predniSONE 20 MG tablet Commonly known as: DELTASONE Take 2 tablets (40 mg total) by mouth daily with breakfast for 2 days, THEN 1.5 tablets (30 mg total) daily with breakfast for 2 days, THEN 1 tablet (20 mg total) daily with breakfast for 2 days, THEN 0.5 tablets (10 mg total) daily with breakfast for 2 days. Start taking on: October 10, 2021   rosuvastatin 40 MG tablet Commonly known as: CRESTOR TAKE ONE TABLET BY MOUTH EVERY DAY   Spacer/Aero Chamber Mouthpiece Misc 1 Device by Does not apply route daily as needed.   Spiriva HandiHaler 18 MCG inhalation capsule Generic drug: tiotropium place ONE capsule into inhaler AND inhale EVERY DAY   spironolactone 25 MG tablet Commonly known as: ALDACTONE Take 1 tablet (25 mg total) by mouth daily. What changed: Another medication with the same name was removed. Continue taking this medication, and follow the directions you  see here.   tiZANidine 4 MG tablet Commonly known as: ZANAFLEX TAKE 1/2 TABLET BY MOUTH EVERY 12 HOURS AS NEEDED muscle SPASMS        Follow-up Information     Fay Records, MD Follow up.   Specialty: Cardiology Why: the office should call you Monday or Tuesday for follow up - If you have not heard by Wednesday please call the office. Contact information: [place] [address] [phone]         Martinique, Betty G, MD. Schedule an appointment as soon as possible for a visit in 1 week(s).   Specialty: Family Medicine Contact information: [place] [address] [phone]         Fay Records, MD .   Specialty: Cardiology Contact information: [address] [place] [address] [phone]                Discharge Exam: Danley Danker Weights   09/30/21 1659  Weight: 61.7 kg   General: NAD  Cardiovascular: S1, S2 present Respiratory: Diminished breath sounds bilaterally Abdomen: Soft, nontender, nondistended, bowel sounds present Musculoskeletal: No bilateral pedal edema noted Skin: Normal Psychiatry: Normal mood   Condition at discharge: stable  The results of significant diagnostics from this hospitalization (including imaging, microbiology, ancillary and laboratory) are listed below for reference.   Imaging Studies: CT HEAD WO CONTRAST (5MM)  Result Date: 10/08/2021 CLINICAL DATA:  Head trauma, minor. EXAM: CT HEAD WITHOUT CONTRAST TECHNIQUE: Contiguous axial images were obtained from the base of the skull through the vertex without intravenous contrast. RADIATION DOSE REDUCTION: This exam was performed according to the departmental dose-optimization program which includes automated exposure control, adjustment of the mA and/or kV according to patient size and/or use of iterative reconstruction technique. COMPARISON:  06/11/2021. FINDINGS: Brain: No acute intracranial hemorrhage, midline shift or mass effect. No extra-axial fluid collection. There is stable encephalomalacia in the PCA territory on the right. Periventricular white matter hypodensities are noted bilaterally. Ventricles are stable in size and configuration. Vascular: Atherosclerotic calcification of the carotid siphons and vertebral arteries. No hyperdense vessel. Skull: Normal. Negative for fracture or focal lesion. Sinuses/Orbits: Mucosal thickening is present in the right frontal sinus, left maxillary sinus, and right ethmoid air cells. No acute orbital abnormality. Other: None. IMPRESSION: 1. No acute intracranial process. 2. Atrophy with chronic microvascular ischemic changes and old infarct in the PCA territory on the right. Electronically Signed   By: Brett Fairy  M.D.   On: 10/08/2021 05:01   DG Swallowing Func-Speech Pathology  Result Date: 10/05/2021 Table formatting from the original result was not included. Images from the original result were not included. Objective Swallowing Evaluation: Type of Study: MBS-Modified Barium Swallow Study  Patient Details Name: Beatrice Ziehm MRN: [id number] Date of Birth: Oct 26, 1945 Today's Date: 10/05/2021 Time: SLP Start Time (ACUTE ONLY): 1219 -SLP Stop Time (ACUTE ONLY): 1235 SLP Time Calculation (min) (ACUTE ONLY): 16 min Past Medical History: Past Medical History: Diagnosis Date  Allergy   Arthritis   Bipolar 1 disorder ([place])   Blood transfusion without reported diagnosis   CAD (coronary artery disease) 04/17/2015  Carotid artery stenosis   50-69% bilateral ICA stenoses by velocity criteria but no visible plaque and ICA/CCA ratio 1.72 on right and 1.38 on left  Chronic airway obstruction, not elsewhere classified 03/02/2010  Overview:  Chronic Obstructive Pulmonary Disease  CKD (chronic kidney disease) stage 3, GFR 30-59 ml/min ([place])  09/18/2021  Coronary artery disease   Depression   Diabetes mellitus without complication (HCC)   Echocardiogram   Echo 06/2018: Hyperdynamic systolic function, EF >42, mild concentric LVH, elevated LVEDP (E/E' suggests impaired relaxation), mild AI, lipomatous interatrial septum  Hypertension   Left adrenal mass (HCC)   2.5 cm L adrenal mass on CT per note of Dr. Seleta Rhymes 05/20/2014  MI, old   PAD (peripheral artery disease) ([place])   03/2014 - CT aortogram with lower extremity runoff (mild to moderate disease in common iliac arteries, complete occulusion of her bilateral external iliac arteries with heavily disease common femoral arteries. She also has disease to her SFAs bilaterally. Popliteal arteries and tibial vessel runoff appears to be adequate)  Seizures ([place])   Stroke [facility])  Past Surgical History: Past Surgical History: Procedure Laterality Date  BREAST SURGERY  2012  small lump nonmilignant  NECK  SURGERY   HPI: Amran Trinia Georgi is a 76 y.o. female with medical history significant for type 2 diabetes, COPD, hyperlipidemia, hypertension, CKD 3B, coronary artery disease, peripheral artery disease, CVA, seizure disorder, OSA, who initially presented to [facility] ED with complaints of persistent nonproductive cough. CTA chest was negative for pulmonary embolism and unequivocal for pneumonia.  COVID-19 negative.  Troponin up trended, peaked at 304.  No data recorded  Recommendations for follow up therapy are one component of a multi-disciplinary discharge planning process, led by the attending physician.  Recommendations may be updated based on patient status, additional functional criteria and insurance authorization. Assessment / Plan / Recommendation   10/05/2021   1:03 PM Clinical Impressions Clinical Impression Pt seen for MBS and demonstrated mild oropharyngeal dysphagia marked by decreased cohesion with brief sublingual spill. Residue from oral cavity spilled to pyriform sinuses during straw trial.  Pharyngeal phase overall, within functional limits, with once instance of flash penetration with thin. System was challenged with multiple sips thin via straw. There was vallecular residue mild-mod that cleared with spontaneous second swallows. Pt did not have episodes of coughing at baseline as during previous encounters and work of breathing was stable during MBS. Her risk of penetration/aspiration increases as she becomes short of breath or is coughing at baseline. Recommend she continue with regular/thin liquids, straws allowed, pills whole in puree and rest breaks if needed. No further ST needed at this time. SLP Visit Diagnosis Dysphagia, oropharyngeal phase (R13.12) Impact on safety and function Mild aspiration risk     10/05/2021   1:03 PM Treatment Recommendations Treatment Recommendations No treatment recommended at this time      No data to display      10/05/2021   1:03 PM Diet Recommendations SLP Diet  Recommendations Regular solids;Thin liquid Liquid Administration via Cup;Straw Medication Administration Whole meds with puree Compensations Small sips/bites;Slow rate Postural Changes Seated upright at 90 degrees     10/05/2021   1:03 PM Other Recommendations Oral Care Recommendations Oral care BID Follow Up Recommendations No SLP follow up Assistance recommended at discharge None   10/02/2021   2:43 PM Frequency and Duration  Speech Therapy Frequency (ACUTE ONLY) min 1 x/week Treatment Duration 1 week     10/05/2021   1:03 PM Oral Phase Oral Phase Impaired Oral - Thin Cup WFL Oral - Thin Straw Decreased bolus cohesion Oral - Puree WFL Oral - Regular --    10/05/2021   1:03 PM Pharyngeal Phase Pharyngeal Phase Impaired Pharyngeal- Thin Cup Penetration/Aspiration during swallow;Pharyngeal residue - valleculae Pharyngeal Material enters airway, remains ABOVE vocal cords then  ejected out Pharyngeal- Thin Straw Delayed swallow initiation-pyriform sinuses;Pharyngeal residue - pyriform;Pharyngeal residue - valleculae Pharyngeal- Puree Pharyngeal residue - valleculae Pharyngeal- Regular [facility]    10/05/2021   1:03 PM Cervical Esophageal Phase  Cervical Esophageal Phase WFL [place] Siren 10/05/2021, 3:02 PM                     DG CHEST PORT 1 VIEW  Result Date: 10/04/2021 CLINICAL DATA:  Shortness of breath EXAM: PORTABLE CHEST 1 VIEW COMPARISON:  09/30/2021 FINDINGS: Stable cardiomediastinal contours. Aortic atherosclerosis. Slightly low lung volumes with mild bibasilar atelectasis. No appreciable pleural fluid collection. No pneumothorax. IMPRESSION: Low lung volumes with mild bibasilar atelectasis. Electronically Signed   By: Davina Poke D.O.   On: 10/04/2021 11:37   ECHOCARDIOGRAM COMPLETE  Result Date: 10/02/2021    ECHOCARDIOGRAM REPORT   Patient Name:   LAYCEE FITZSIMMONS Sorter Date of Exam: 10/02/2021 Medical Rec #:  [id number]        Height:       63.0 in Accession #:    [id number]       Weight:       136.0 lb Date  of Birth:  1945/10/29        BSA:          1.641 m Patient Age:    47 years         BP:           199/78 mmHg Patient Gender: F                HR:           96 bpm. Exam Location:  Inpatient Procedure: 2D Echo, Cardiac Doppler and Color Doppler Indications:    Elevated troponin  History:        Patient has prior history of Echocardiogram examinations, most                 recent 06/29/2018. Previous Myocardial Infarction and CAD, COPD,                 Signs/Symptoms:Shortness of Breath; Risk Factors:Diabetes and                 Hypertension. Hx CVA. PAD.  Sonographer:    Clayton Lefort RDCS (AE) Referring Phys: [id number] [place]  1. Left ventricular ejection fraction, by estimation, is 70 to 75%. The left ventricle has hyperdynamic function. The left ventricle has no regional wall motion abnormalities. There is mild left ventricular hypertrophy of the basal-septal segment. Left ventricular diastolic parameters are consistent with Grade I diastolic dysfunction (impaired relaxation). Elevated left atrial pressure.  2. Right ventricular systolic function is normal. The right ventricular size is normal.  3. The mitral valve is normal in structure. No evidence of mitral valve regurgitation. No evidence of mitral stenosis.  4. The aortic valve was not well visualized. Aortic valve regurgitation is not visualized. No aortic stenosis is present.  5. The inferior vena cava is normal in size with greater than 50% respiratory variability, suggesting right atrial pressure of 3 mmHg. FINDINGS  Left Ventricle: Left ventricular ejection fraction, by estimation, is 70 to 75%. The left ventricle has hyperdynamic function. The left ventricle has no regional wall motion abnormalities. The left ventricular internal cavity size was normal in size. There is mild left ventricular hypertrophy of the basal-septal segment. Left ventricular diastolic parameters are consistent with Grade I diastolic dysfunction (impaired relaxation).  Elevated left atrial pressure. Right  Ventricle: The right ventricular size is normal. Right ventricular systolic function is normal. Left Atrium: Left atrial size was normal in size. Right Atrium: Right atrial size was normal in size. Pericardium: There is no evidence of pericardial effusion. Mitral Valve: The mitral valve is normal in structure. No evidence of mitral valve regurgitation. No evidence of mitral valve stenosis. MV peak gradient, 6.9 mmHg. The mean mitral valve gradient is 3.0 mmHg. Tricuspid Valve: The tricuspid valve is normal in structure. Tricuspid valve regurgitation is trivial. No evidence of tricuspid stenosis. Aortic Valve: The aortic valve was not well visualized. Aortic valve regurgitation is not visualized. No aortic stenosis is present. Aortic valve mean gradient measures 6.0 mmHg. Aortic valve peak gradient measures 11.6 mmHg. Aortic valve area, by VTI measures 1.45 cm. Pulmonic Valve: The pulmonic valve was not well visualized. Pulmonic valve regurgitation is not visualized. No evidence of pulmonic stenosis. Aorta: The aortic root is normal in size and structure. Venous: The inferior vena cava is normal in size with greater than 50% respiratory variability, suggesting right atrial pressure of 3 mmHg. IAS/Shunts: No atrial level shunt detected by color flow Doppler.  LEFT VENTRICLE PLAX 2D LVIDd:         3.10 cm   Diastology LVIDs:         2.20 cm   LV e' medial:    4.46 cm/s LV PW:         1.20 cm   LV E/e' medial:  20.7 LV IVS:        1.70 cm   LV e' lateral:   5.44 cm/s LVOT diam:     1.80 cm   LV E/e' lateral: 17.0 LV SV:         47 LV SV Index:   29 LVOT Area:     2.54 cm  RIGHT VENTRICLE RV Basal diam:  2.50 cm RV S prime:     14.70 cm/s TAPSE (M-mode): 2.5 cm LEFT ATRIUM             Index        RIGHT ATRIUM           Index LA diam:        2.30 cm 1.40 cm/m   RA Area:     10.20 cm LA Vol (A2C):   30.1 ml 18.34 ml/m  RA Volume:   17.80 ml  10.84 ml/m LA Vol (A4C):   31.2 ml  19.01 ml/m LA Biplane Vol: 33.2 ml 20.23 ml/m  AORTIC VALVE AV Area (Vmax):    1.63 cm AV Area (Vmean):   1.60 cm AV Area (VTI):     1.45 cm AV Vmax:           170.00 cm/s AV Vmean:          114.000 cm/s AV VTI:            0.327 m AV Peak Grad:      11.6 mmHg AV Mean Grad:      6.0 mmHg LVOT Vmax:         109.00 cm/s LVOT Vmean:        71.900 cm/s LVOT VTI:          0.186 m LVOT/AV VTI ratio: 0.57  AORTA Ao Root diam: 2.50 cm Ao Asc diam:  2.60 cm MITRAL VALVE MV Area (PHT): 2.56 cm     SHUNTS MV Area VTI:   2.47 cm     Systemic VTI:  0.19 m  MV Peak grad:  6.9 mmHg     Systemic Diam: 1.80 cm MV Mean grad:  3.0 mmHg MV Vmax:       1.31 m/s MV Vmean:      85.2 cm/s MV Decel Time: 296 msec MV E velocity: 92.40 cm/s MV A velocity: 121.00 cm/s MV E/A ratio:  0.76 Kirk Ruths MD Electronically signed by Kirk Ruths MD Signature Date/Time: 10/02/2021/11:33:52 AM    Final    CT Angio Chest PE W and/or Wo Contrast  Result Date: 09/30/2021 CLINICAL DATA:  Concern for pulmonary embolism. EXAM: CT ANGIOGRAPHY CHEST WITH CONTRAST TECHNIQUE: Multidetector CT imaging of the chest was performed using the standard protocol during bolus administration of intravenous contrast. Multiplanar CT image reconstructions and MIPs were obtained to evaluate the vascular anatomy. RADIATION DOSE REDUCTION: This exam was performed according to the departmental dose-optimization program which includes automated exposure control, adjustment of the mA and/or kV according to patient size and/or use of iterative reconstruction technique. CONTRAST:  73m OMNIPAQUE IOHEXOL 350 MG/ML SOLN COMPARISON:  Chest radiograph dated 09/30/2021. FINDINGS: Cardiovascular: There is no cardiomegaly or pericardial effusion. Mild atherosclerotic calcification of the thoracic aorta. No aneurysmal dilatation. The origins of the great vessels of the aortic arch appear patent. No pulmonary artery embolus identified. Mediastinum/Nodes: No hilar or mediastinal  adenopathy. Top-normal left hilar lymph node measures 8 mm. The esophagus and the thyroid gland are grossly unremarkable. No mediastinal fluid collection. Lungs/Pleura: The lungs are clear. There is no pleural effusion pneumothorax. The central airways are patent. Upper Abdomen: No acute abnormality. Musculoskeletal: No chest wall abnormality. No acute or significant osseous findings. Review of the MIP images confirms the above findings. IMPRESSION: 1. No acute intrathoracic pathology. No CT evidence of pulmonary artery embolus. 2. Aortic Atherosclerosis (ICD10-I70.0). Electronically Signed   By: AAnner CreteM.D.   On: 09/30/2021 21:04   DG Chest Port 1 View  Result Date: 09/30/2021 CLINICAL DATA:  Shortness of breath and cough beginning last night. EXAM: PORTABLE CHEST 1 VIEW COMPARISON:  One-view chest x-ray 03/14/2019. FINDINGS: The heart is enlarged. Atherosclerotic calcifications are present at the aorta. A moderate-sized hiatal hernia is noted. Lung volumes are low. Chronic interstitial coarsening is present without superimposed airspace disease. Degenerative changes are noted both shoulders. Cervical spine surgery noted. IMPRESSION: 1. Cardiomegaly without failure. 2. No acute cardiopulmonary disease. 3. Moderate-sized hiatal hernia. Electronically Signed   By: CSan MorelleM.D.   On: 09/30/2021 17:42    Microbiology: Results for orders placed or performed during the hospital encounter of 09/30/21  Resp Panel by RT-PCR (Flu A&B, Covid) Anterior Nasal Swab     Status: None   Collection Time: 09/30/21  6:11 PM   Specimen: Anterior Nasal Swab  Result Value Ref Range Status   SARS Coronavirus 2 by RT PCR NEGATIVE NEGATIVE Final    Comment: (NOTE) SARS-CoV-2 target nucleic acids are NOT DETECTED.  The SARS-CoV-2 RNA is generally detectable in upper respiratory specimens during the acute phase of infection. The lowest concentration of SARS-CoV-2 viral copies this assay can detect  is 138 copies/mL. A negative result does not preclude SARS-Cov-2 infection and should not be used as the sole basis for treatment or other patient management decisions. A negative result may occur with  improper specimen collection/handling, submission of specimen other than nasopharyngeal swab, presence of viral mutation(s) within the areas targeted by this assay, and inadequate number of viral copies(<138 copies/mL). A negative result must be combined with clinical observations, patient history,  and epidemiological information. The expected result is Negative.  Fact Sheet for Patients:  EntrepreneurPulse.com.au  Fact Sheet for Healthcare Providers:  IncredibleEmployment.be  This test is no t yet approved or cleared by the [place] FDA and  has been authorized for detection and/or diagnosis of SARS-CoV-2 by FDA under an Emergency Use Authorization (EUA). This EUA will remain  in effect (meaning this test can be used) for the duration of the COVID-19 declaration under Section 564(b)(1) of the Act, 21 U.S.C.section 360bbb-3(b)(1), unless the authorization is terminated  or revoked sooner.       Influenza A by PCR NEGATIVE NEGATIVE Final   Influenza B by PCR NEGATIVE NEGATIVE Final    Comment: (NOTE) The Xpert Xpress SARS-CoV-2/FLU/RSV plus assay is intended as an aid in the diagnosis of influenza from Nasopharyngeal swab specimens and should not be used as a sole basis for treatment. Nasal washings and aspirates are unacceptable for Xpert Xpress SARS-CoV-2/FLU/RSV testing.  Fact Sheet for Patients: EntrepreneurPulse.com.au  Fact Sheet for Healthcare Providers: IncredibleEmployment.be  This test is not yet approved or cleared by the [place] FDA and has been authorized for detection and/or diagnosis of SARS-CoV-2 by FDA under an Emergency Use Authorization (EUA). This EUA will remain in effect  (meaning this test can be used) for the duration of the COVID-19 declaration under Section 564(b)(1) of the Act, 21 U.S.C. section 360bbb-3(b)(1), unless the authorization is terminated or revoked.  Performed at [facility], [address], [place], [place] [address]     Labs: CBC: Recent Labs  Lab 10/03/21 0234 10/04/21 0817 10/05/21 0550 10/08/21 0224 10/09/21 0248  WBC 12.7* 14.9* 11.3* 17.3* 15.8*  NEUTROABS  --   --   --  13.0* 11.3*  HGB 9.6* 10.2* 10.0* 10.4* 10.1*  HCT 28.3* 30.0* 30.2* 31.6* 29.7*  MCV 85.0 85.5 87.0 87.3 84.6  PLT 240 288 290 319 982   Basic Metabolic Panel: Recent Labs  Lab 10/05/21 0550 10/06/21 0838 10/07/21 0427 10/08/21 0224 10/09/21 0248  NA 133* 137 134* 136 134*  K 4.5 3.8 4.7 4.7 4.2  CL 106 109 106 108 107  CO2 18* 19* 20* 23 19*  GLUCOSE 312* 136* 149* 130* 83  BUN '17 19 20 '$ 28* 31*  CREATININE 1.03* 1.07* 1.21* 1.43* 1.14*  CALCIUM 9.3 9.9 9.8 9.8 9.9   Liver Function Tests: No results for input(s): "AST", "ALT", "ALKPHOS", "BILITOT", "PROT", "ALBUMIN" in the last 168 hours. CBG: Recent Labs  Lab 10/08/21 1934 10/09/21 0027 10/09/21 0445 10/09/21 0745 10/09/21 1218  GLUCAP 269* 103* 91 96 164*    Discharge time spent: greater than 30 minutes.  Signed: Alma Friendly, MD [facility] 10/09/2021

## 2021-10-09 NOTE — TOC Transition Note (Addendum)
Transition of Care [facility]) - CM/SW Discharge Note   Patient Details  Name: Kathryn Lewis MRN: [id number] Date of Birth: Apr 10, 1945  [facility] of [facility]) CM/SW Contact:  Milas Gain, [place] Phone Number: 10/09/2021, 3:45 PM   Clinical Narrative:     Patient will DC to: [facility]   Anticipated DC date: 10/09/2021  Family notified: [name]  Transport by: By daughter [name]  ?  Per MD patient ready for DC to [facility] . RN, patient, patient's family, and facility notified of DC. Discharge Summary ,FL2, and HH orders sent to facility. RN given number for report tele# [phone] ask for med tech [id number]#[email or web address]. Patients daughter [name] will transport patient by car to ALF.  CSW signing off.   Final next level of care: Assisted Living ([facility]) Barriers to Discharge: No Barriers Identified   Patient Goals and CMS Choice   CMS Medicare.gov Compare Post Acute Care list provided to:: Patient Represenative (must comment) (Patients daughter [name]) Choice offered to / list presented to : Adult Children (Patients daughter [name])  Discharge Placement              Patient chooses bed at:  [facility]) Patient to be transferred to facility by: her daughter [name] by car Name of family member notified: [name] Patient and family notified of of transfer: 10/09/21  Discharge Plan and Services In-house Referral: Clinical Social Work                                   Social Determinants of Health (SDOH) Interventions     Readmission Risk Interventions    10/08/2021   11:56 AM  Readmission Risk Prevention Plan  Transportation Screening Complete  HRI or Home Care Consult Complete  SW Recovery Care/Counseling Consult Complete  Skilled Nursing Facility Complete

## 2021-10-09 NOTE — [place] FL2 (Addendum)
[name] LEVEL OF CARE SCREENING TOOL     IDENTIFICATION  Patient Name: Kathryn Lewis Birthdate: Jan 07, 1946 Sex: female Admission Date (Current Location): 09/30/2021  [facility] and [place] Number:  Herbalist and Address:  [facility] [place]. [facility], [place] [address] [place], [place] [address]      Provider Number: [id number]  Attending Physician Name and Address:  Alma Friendly, MD  Relative Name and Phone Number:  [name] (Daughter) [phone]    Current Level of Care: Hospital Recommended Level of Care: [place] ([facility]) Prior Approval Number:    Date Approved/Denied:   PASRR Number:    Discharge Plan: Other (Comment) ([facility])    Current Diagnoses: Patient Active Problem List   Diagnosis Date Noted   Elevated troponin 10/01/2021   CKD (chronic kidney disease) stage 3, GFR 30-59 ml/min (HCC) 09/18/2021   Dementia without behavioral disturbance ([place]) 01/23/2019   Peripheral polyneuropathy 10/24/2018   PAD (peripheral artery disease) ([place]) 04/05/2018   Carotid artery disease ([place]) 04/05/2018   Hypokalemia 02/27/2018   Frequent falls 07/08/2017   Back pain 07/08/2017   Mixed hyperlipidemia 05/27/2017   Unstable gait 01/10/2017   Hyperlipidemia associated with type 2 diabetes mellitus ([place]) 01/10/2017   OSA on CPAP 12/24/2016   COPD (chronic obstructive pulmonary disease) ([place]) 12/24/2016   Pulmonary nodule 12/24/2016   Generalized weakness 12/12/2015   Diabetes mellitus type 2 with neurological manifestations ([place]) 12/12/2015   Hypertension with heart disease 12/12/2015   Acute bronchitis    Seizure disorder as sequela of cerebrovascular accident ([place]) 04/17/2015   CAD (coronary artery disease) 04/17/2015   Anemia, unspecified 03/02/2010   Cough 03/02/2010   Depressive disorder, not elsewhere classified 03/02/2010   Pneumonia, organism unspecified(486) 03/02/2010   Shortness of  breath 03/02/2010   Type II diabetes mellitus with neurological manifestations ([place]) 03/02/2010   Wheezing 03/02/2010   Chronic airway obstruction, not elsewhere classified 03/02/2010   Essential hypertension 03/02/2010   Acute, but ill-defined, cerebrovascular disease 03/02/2010    Orientation RESPIRATION BLADDER Height & Weight     Place, Self  Normal Continent Weight: 136 lb 0.4 oz (61.7 kg) Height:  '5\' 3"'$  (160 cm)  BEHAVIORAL SYMPTOMS/MOOD NEUROLOGICAL BOWEL NUTRITION STATUS      Continent (WDL) Diet: Cardiac  AMBULATORY STATUS COMMUNICATION OF NEEDS Skin   Limited Assist Verbally Other (Comment) (Appropriate for ethnicity,dry,intact,non-tenting)                       Personal Care Assistance Level of Assistance  Bathing, Feeding, Dressing Bathing Assistance: Limited assistance Feeding assistance: Independent (can feed self) Dressing Assistance: Limited assistance     Functional Limitations Info  Sight, Hearing, Speech Sight Info: Impaired Hearing Info: Impaired Speech Info: Adequate    SPECIAL CARE FACTORS FREQUENCY   (Please see HH PT/OT orders)                    Contractures Contractures Info: Not present    Additional Factors Info  Code Status, Allergies, Insulin Sliding Scale Code Status Info: DNR Allergies Info: Amoxicillin,Augmentin (amoxicillin-pot Clavulanate),Cephalexin   Insulin Sliding Scale Info: insulin aspart (novoLOG) injection 0-9 Units every 4 hours,insulin glargine-yfgn (SEMGLEE) injection 15 Units daily at bedtime       TAKE these medications     albuterol 108 (90 Base) MCG/ACT inhaler Commonly known as: VENTOLIN HFA Inhale 2 puffs into the lungs every 6 (six) hours as  needed for wheezing or shortness of breath.    amLODipine 10 MG tablet Commonly known as: NORVASC Take 1 tablet (10 mg total) by mouth daily. Start taking on: October 10, 2021 What changed:  medication strength how much to take    Aspirin Low Dose 81 MG  tablet Generic drug: aspirin EC TAKE ONE TABLET BY MOUTH EVERY DAY    budesonide-formoterol 160-4.5 MCG/ACT inhaler Commonly known as: Symbicort Inhale 2 puffs into the lungs 2 (two) times daily.    clopidogrel 75 MG tablet Commonly known as: PLAVIX TAKE ONE TABLET BY MOUTH EVERY DAY    ezetimibe 10 MG tablet Commonly known as: ZETIA TAKE ONE TABLET BY MOUTH EVERY DAY    FeroSul 325 (65 FE) MG tablet Generic drug: ferrous sulfate TAKE ONE TABLET BY MOUTH EVERY TWO DAYS with FOUR OUNCE of orange JUICE    Fish Oil 1000 MG Caps TAKE TWO CAPSULES BY MOUTH EVERY DAY    fluticasone 50 MCG/ACT nasal spray Commonly known as: FLONASE Place 2 sprays into both nostrils daily.    furosemide 20 MG tablet Commonly known as: LASIX Take 1 tablet (20 mg total) by mouth daily as needed for edema.    GNP PAIN RELIEF EX-STRENGTH 500 MG tablet Generic drug: acetaminophen TAKE ONE TABLET BY MOUTH FOUR TIMES DAILY    GNP Vitamin D Maximum Strength 50 MCG (2000 UT) Tabs Generic drug: Cholecalciferol TAKE ONE TABLET BY MOUTH EVERY DAY    guaiFENesin 600 MG 12 hr tablet Commonly known as: Mucinex Take 1 tablet (600 mg total) by mouth daily as needed for to loosen phlegm.    hydrALAZINE 25 MG tablet Commonly known as: APRESOLINE Take 1 tablet (25 mg total) by mouth every 8 (eight) hours.    HYDROcodone-acetaminophen 5-325 MG tablet Commonly known as: NORCO/VICODIN Take 1 tablet by mouth every 6 (six) hours as needed for up to 3 days for moderate pain.    Insulin Aspart FlexPen 100 UNIT/ML Commonly known as: NOVOLOG INJECT subcutaneously THREE TIMES DAILY PER sliding scales BLOOD SUGAR (150 NO INSULIN)+ 151-180 Give FOUR units. 181-220 Give SIX units, 221-260 Give EIGHT units, 261-300 Give 10 units 301-340 Give 12 units AND > 341 Give 14 units    ipratropium-albuterol 0.5-2.5 (3) MG/3ML Soln Commonly known as: DUONEB Take 3 mLs by nebulization 2 (two) times daily.    isosorbide  mononitrate 30 MG 24 hr tablet Commonly known as: IMDUR TAKE ONE TABLET BY MOUTH EVERY DAY    lamoTRIgine 200 MG tablet Commonly known as: LAMICTAL TAKE 1.5 TABLETS BY MOUTH TWICE DAILY    Lantus SoloStar 100 UNIT/ML Solostar Pen Generic drug: insulin glargine INJECT 20 units AT BEDTIME    losartan 50 MG tablet Commonly known as: COZAAR TAKE ONE TABLET BY MOUTH EVERY DAY    metoprolol tartrate 50 MG tablet Commonly known as: LOPRESSOR TAKE ONE TABLET BY MOUTH TWICE DAILY    phenytoin 100 MG ER capsule Commonly known as: DILANTIN Take 3 capsules (300 mg total) by mouth daily.    phenytoin 30 MG ER capsule Commonly known as: Dilantin Take 1 capsule (30 mg total) by mouth at bedtime.    polyvinyl alcohol 1.4 % ophthalmic solution Commonly known as: Artificial Tears Place 1 drop into both eyes in the morning and at bedtime.    predniSONE 20 MG tablet Commonly known as: DELTASONE Take 2 tablets (40 mg total) by mouth daily with breakfast for 2 days, THEN 1.5 tablets (30 mg total) daily with breakfast  for 2 days, THEN 1 tablet (20 mg total) daily with breakfast for 2 days, THEN 0.5 tablets (10 mg total) daily with breakfast for 2 days. Start taking on: October 10, 2021    rosuvastatin 40 MG tablet Commonly known as: CRESTOR TAKE ONE TABLET BY MOUTH EVERY DAY    Spacer/Aero Chamber Mouthpiece Misc 1 Device by Does not apply route daily as needed.    Spiriva HandiHaler 18 MCG inhalation capsule Generic drug: tiotropium place ONE capsule into inhaler AND inhale EVERY DAY    spironolactone 25 MG tablet Commonly known as: ALDACTONE Take 1 tablet (25 mg total) by mouth daily. What changed: Another medication with the same name was removed. Continue taking this medication, and follow the directions you see here.    tiZANidine 4 MG tablet Commonly known as: ZANAFLEX TAKE 1/2 TABLET BY MOUTH EVERY 12 HOURS AS NEEDED muscle SPASMS      Relevant Imaging Results:  Relevant  Lab Results:   Additional Information [id number]-[id number]  Milas Gain, LCSWA

## 2021-10-09 NOTE — [place] Notes (Signed)
Mobility Specialist - [place] Note   10/09/21 1614  Mobility  Activity Ambulated with assistance in room  Level of Assistance Standby assist, set-up cues, supervision of patient - no hands on  Assistive Device Front wheel walker  Distance Ambulated (ft) 16 ft  Activity Response Tolerated well  $Mobility charge 1 Mobility   Pt was received in bed and requesting to go to BR. No c/o pain throughout. Pt was returned to bed with all needs met and bed alarm on.  Larey Seat

## 2021-10-09 NOTE — Care Management (Signed)
1423 10-09-21 Case Manager was asked to call the daughter regarding home health services. Case Manager called the daughter and the daughter Kathryn Lewis is referring to that the patient needs personal care services for the patient- someone to sit with the patient during the day. Case Manager educated the daughter regarding the difference between personal care and home health services. Case Manager did make the daughter aware that personal care services range from $21-$29.00 an hour and that family is responsible for arranging the services. Daughter is agreeable to [employer] providing the patient with PT/OT in house and the family is responsible for personal care services. CSW is aware of the plan of care. No further needs identified at this time.

## 2021-10-12 ENCOUNTER — Telehealth: Payer: Self-pay

## 2021-10-12 ENCOUNTER — Telehealth: Payer: Self-pay | Admitting: Family Medicine

## 2021-10-12 DIAGNOSIS — F0390 Unspecified dementia without behavioral disturbance: Secondary | ICD-10-CM | POA: Diagnosis not present

## 2021-10-12 DIAGNOSIS — I10 Essential (primary) hypertension: Secondary | ICD-10-CM | POA: Diagnosis not present

## 2021-10-12 NOTE — Progress Notes (Unsigned)
Office Visit    Patient Name: Kathryn Lewis Date of Encounter: 10/14/2021  Primary Care Provider:  Martinique, Betty G, MD Primary Cardiologist:  Kathryn Carnes, MD Primary Electrophysiologist: None  Chief Complaint    Kathryn Lewis is a 76 y.o. female with PMH HLD, HTN, dementia, DM type II, CKD stage IIIb, COPD, prior CVA 2015, CAD, peripheral artery disease who presents today for posthospital follow-up.  Past Medical History    Past Medical History:  Diagnosis Date   Allergy    Arthritis    Bipolar 1 disorder ([place])    Blood transfusion without reported diagnosis    CAD (coronary artery disease) 04/17/2015   Carotid artery stenosis    50-69% bilateral ICA stenoses by velocity criteria but no visible plaque and ICA/CCA ratio 1.72 on right and 1.38 on left   Chronic airway obstruction, not elsewhere classified 03/02/2010   Overview:  Chronic Obstructive Pulmonary Disease   CKD (chronic kidney disease) stage 3, GFR 30-59 ml/min (HCC) 09/18/2021   Coronary artery disease    Depression    Diabetes mellitus without complication (HCC)    Echocardiogram    Echo 06/2018: Hyperdynamic systolic function, EF >09, mild concentric LVH, elevated LVEDP (E/E' suggests impaired relaxation), mild AI, lipomatous interatrial septum   Hypertension    Left adrenal mass (HCC)    2.5 cm L adrenal mass on CT per note of Dr. Seleta Rhymes 05/20/2014   MI, old    PAD (peripheral artery disease) ([employer])    03/2014 - CT aortogram with lower extremity runoff (mild to moderate disease in common iliac arteries, complete occulusion of her bilateral external iliac arteries with heavily disease common femoral arteries. She also has disease to her SFAs bilaterally. Popliteal arteries and tibial vessel runoff appears to be adequate)   Seizures ([place])    Stroke [facility])    Past Surgical History:  Procedure Laterality Date   BREAST SURGERY  2012   small lump nonmilignant   NECK SURGERY      Allergies  Allergies  Allergen  Reactions   Amoxicillin Other (See Comments)    colitis   Augmentin [Amoxicillin-Pot Clavulanate] Other (See Comments)    colitis   Cephalexin Other (See Comments)    Colitis - ceftriaxone ok    History of Present Illness    Kathryn Lewis  is a 76 year old female with the above mention past medical history who presents today for posthospital follow-up of recent COPD exacerbation with elevated troponins.  Kathryn Lewis has been followed by Dr. Harrington Challenger since 2017.  She was previously followed by cardiology in [place], [place].  She presented to ED with complaint of chest and arm pain.  She was noted to have elevated blood pressure of 202/83 and was given nitroglycerin ointment with significant improvement.  EKG completed revealed nonspecific ST changes in inferior lateral leads.  Troponins completed and were negative x3.  2D echo was completed with EF of 55-60%, with mild MR.  She had previous LHC completed in [place] that showed 50% proximal LAD and 80% mid percent distal.  She was seen recently on 09/2021 troponins related to COPD exacerbation.   Kathryn Lewis presents today for posthospital follow-up with her daughter.  Since last being seen in the office patient reports she is doing much better and not having any cardiac complaints or breathing difficulties.  She is tolerating her current medications and blood pressure is well controlled at 124/70 and heart rate of  74 bpm.  She has complaint of increased dry mouth and I encouraged her to try sugar-free candies instead of drinking excess fluid.  She is otherwise euvolemic on examination today.  Her daughter is her primary caregiver and patient also lives in [place] home.  Patient denies chest pain, palpitations, dyspnea, PND, orthopnea, nausea, vomiting, dizziness, syncope, edema, weight gain, or early satiety.  Home Medications    Current Outpatient Medications  Medication Sig Dispense Refill   albuterol (VENTOLIN HFA)  108 (90 Base) MCG/ACT inhaler Inhale 2 puffs into the lungs every 6 (six) hours as needed for wheezing or shortness of breath. 1 each 6   amLODipine (NORVASC) 10 MG tablet Take 1 tablet (10 mg total) by mouth daily. 30 tablet 0   ASPIRIN LOW DOSE 81 MG tablet TAKE ONE TABLET BY MOUTH EVERY DAY 30 tablet 2   budesonide-formoterol (SYMBICORT) 160-4.5 MCG/ACT inhaler Inhale 2 puffs into the lungs 2 (two) times daily. 1 each 0   Cholecalciferol (GNP VITAMIN D MAXIMUM STRENGTH) 50 MCG (2000 UT) TABS TAKE ONE TABLET BY MOUTH EVERY DAY 30 tablet 0   clopidogrel (PLAVIX) 75 MG tablet TAKE ONE TABLET BY MOUTH EVERY DAY 30 tablet 5   ezetimibe (ZETIA) 10 MG tablet TAKE ONE TABLET BY MOUTH EVERY DAY 30 tablet 5   FEROSUL 325 (65 Fe) MG tablet TAKE ONE TABLET BY MOUTH EVERY TWO DAYS with FOUR OUNCE of orange JUICE 100 tablet 0   fluticasone (FLONASE) 50 MCG/ACT nasal spray Place 2 sprays into both nostrils daily. 16 Lewis 6   furosemide (LASIX) 20 MG tablet Take 1 tablet (20 mg total) by mouth daily as needed for edema. 30 tablet 3   GNP PAIN RELIEF EX-STRENGTH 500 MG tablet TAKE ONE TABLET BY MOUTH FOUR TIMES DAILY 120 tablet 0   guaiFENesin (MUCINEX) 600 MG 12 hr tablet Take 1 tablet (600 mg total) by mouth daily as needed for to loosen phlegm. 24 tablet 0   hydrALAZINE (APRESOLINE) 25 MG tablet Take 1 tablet (25 mg total) by mouth every 8 (eight) hours. 90 tablet 0   HYDROcodone-acetaminophen (NORCO/VICODIN) 5-325 MG tablet Take 1 tablet by mouth every 6 (six) hours as needed for moderate pain.     Insulin Aspart FlexPen 100 UNIT/ML SOPN INJECT subcutaneously THREE TIMES DAILY PER sliding scales BLOOD SUGAR (150 NO INSULIN)+ 151-180 Give FOUR units. 181-220 Give SIX units, 221-260 Give EIGHT units, 261-300 Give 10 units 301-340 Give 12 units AND > 341 Give 14 units 15 mL 1   ipratropium-albuterol (DUONEB) 0.5-2.5 (3) MG/3ML SOLN Take 3 mLs by nebulization 2 (two) times daily. 180 mL 0   isosorbide mononitrate  (IMDUR) 30 MG 24 hr tablet TAKE ONE TABLET BY MOUTH EVERY DAY 30 tablet 5   lamoTRIgine (LAMICTAL) 200 MG tablet TAKE 1.5 TABLETS BY MOUTH TWICE DAILY 270 tablet 3   LANTUS SOLOSTAR 100 UNIT/ML Solostar Pen INJECT 20 units AT BEDTIME 15 mL 4   losartan (COZAAR) 50 MG tablet TAKE ONE TABLET BY MOUTH EVERY DAY 30 tablet 5   metoprolol tartrate (LOPRESSOR) 50 MG tablet TAKE ONE TABLET BY MOUTH TWICE DAILY 60 tablet 5   Omega-3 Fatty Acids (FISH OIL) 1000 MG CAPS TAKE TWO CAPSULES BY MOUTH EVERY DAY 60 capsule 5   phenytoin (DILANTIN) 100 MG ER capsule Take 3 capsules (300 mg total) by mouth daily. 270 capsule 3   phenytoin (DILANTIN) 30 MG ER capsule Take 1 capsule (30 mg total) by mouth at  bedtime. 90 capsule 0   polyvinyl alcohol (ARTIFICIAL TEARS) 1.4 % ophthalmic solution Place 1 drop into both eyes in the morning and at bedtime. 15 mL 6   rosuvastatin (CRESTOR) 40 MG tablet TAKE ONE TABLET BY MOUTH EVERY DAY 30 tablet 5   Spacer/Aero Chamber Mouthpiece MISC 1 Device by Does not apply route daily as needed. 1 each 0   SPIRIVA HANDIHALER 18 MCG inhalation capsule place ONE capsule into inhaler AND inhale EVERY DAY 30 capsule 5   spironolactone (ALDACTONE) 25 MG tablet Take 1 tablet (25 mg total) by mouth daily. 30 tablet 5   tiZANidine (ZANAFLEX) 4 MG tablet TAKE 1/2 TABLET BY MOUTH EVERY 12 HOURS AS NEEDED muscle SPASMS 30 tablet 1   predniSONE (DELTASONE) 20 MG tablet Take 2 tablets (40 mg total) by mouth daily with breakfast for 2 days, THEN 1.5 tablets (30 mg total) daily with breakfast for 2 days, THEN 1 tablet (20 mg total) daily with breakfast for 2 days, THEN 0.5 tablets (10 mg total) daily with breakfast for 2 days. (Patient not taking: Reported on 10/14/2021) 10 tablet 0   No current facility-administered medications for this visit.     Review of Systems  Please see the history of present illness.    All other systems reviewed and are otherwise negative except as noted  above.  Physical Exam    Wt Readings from Last 3 Encounters:  10/14/21 135 lb 12.8 oz (61.6 kg)  09/30/21 136 lb 0.4 oz (61.7 kg)  12/24/20 136 lb (61.7 kg)   VS: Vitals:   10/14/21 1416  BP: 124/70  Pulse: 74  SpO2: 99%  ,Body mass index is 24.06 kg/m.  Constitutional:      Appearance: Healthy appearance. Not in distress.  Neck:     Vascular: JVD normal.  Pulmonary:     Effort: Pulmonary effort is normal.     Breath sounds: No wheezing. No rales. Diminished in the bases Cardiovascular:     Normal rate. Regular rhythm. Normal S1. Normal S2.      Murmurs: There is no murmur.  Edema:    Peripheral edema absent.  Abdominal:     Palpations: Abdomen is soft non tender. There is no hepatomegaly.  Skin:    General: Skin is warm and dry.  Neurological:     General: No focal deficit present.     Mental Status: Alert and oriented to person, place and time.     Cranial Nerves: Cranial nerves are intact.  EKG/LABS/Other Studies Reviewed    ECG personally reviewed by me today -none completed today  Lab Results  Component Value Date   WBC 15.8 (H) 10/09/2021   HGB 10.1 (L) 10/09/2021   HCT 29.7 (L) 10/09/2021   MCV 84.6 10/09/2021   PLT 292 10/09/2021   Lab Results  Component Value Date   CREATININE 1.14 (H) 10/09/2021   BUN 31 (H) 10/09/2021   NA 134 (L) 10/09/2021   K 4.2 10/09/2021   CL 107 10/09/2021   CO2 19 (L) 10/09/2021   Lab Results  Component Value Date   ALT 11 09/30/2021   AST 15 09/30/2021   ALKPHOS 82 09/30/2021   BILITOT 0.4 09/30/2021   Lab Results  Component Value Date   CHOL 123 10/02/2021   HDL 49 10/02/2021   LDLCALC 61 10/02/2021   TRIG 66 10/02/2021   CHOLHDL 2.5 10/02/2021    Lab Results  Component Value Date   HGBA1C 6.8 (H) 10/02/2021  Assessment & Plan    1.  Coronary artery disease: -LHC completed in [facility] that showed 50% proximal LAD and 80% mid percent distal. -Patient had elevated troponins found  to be demand ischemia due to COPD exacerbation -Today patient reports no chest discomfort or anginal equivalent -Continue GDMT with Plavix 75 mg, ASA 81 mg, Zetia 10 mg, Imdur 30 mg, Crestor 40 mg, Lopressor 50 mg twice daily  2.  Hypertension: -Patient's blood pressure today was well controlled at 124/70 -Continue hydralazine 25 mg, amlodipine 10 mg and Aldactone 25 mg  3.  Hyperlipidemia: -Patient's last LDL cholesterol was 61 -Continue Crestor and Zetia as noted above  4.  COPD: -Continue current medication regimen -Ambulatory referral to pulmonology  5.  Chronic diastolic CHF: -Patient's most recent 2D echo shows hyperdynamic EF of 70-75%,no WMAs, grade I dd, normal RV -Today patient was euvolemic on examination -Continue GDMT with Aldactone 25 mg, metoprolol 50 mg twice daily, losartan 50 mg Lasix 20 mg as needed -Low sodium diet, fluid restriction <2L, and daily weights encouraged. Educated to contact our office for weight gain of 2 lbs overnight or 5 lbs in one week.   6.  History of CVA: -CVA in 2015 patient currently on Plavix and ASA  Disposition: Follow-up with Kathryn Carnes, MD or APP in 6 months   Medication Adjustments/Labs and Tests Ordered: Current medicines are reviewed at length with the patient today.  Concerns regarding medicines are outlined above.   Signed, Mable Fill, Marissa Nestle, NP 10/14/2021, 4:17 PM [place]

## 2021-10-12 NOTE — Telephone Encounter (Signed)
Ross from pt nursing home called to see if they needed to continue to discontinue her Fish Oil or do they need to restart it since he sees it on pt med list.   Pt is coming in for an appt this Fri and if you all can discuss that with her once she is here.   Please advise.

## 2021-10-12 NOTE — Telephone Encounter (Signed)
Transition Care Management Follow-up Telephone Call Date of discharge and from where: [place] 10-09-21 Dx: elevated troponin How have you been since you were released from the hospital? Doing good  Any questions or concerns? No  Items Reviewed: Did the pt receive and understand the discharge instructions provided? Yes  Medications obtained and verified? Yes  Other? No  Any new allergies since your discharge? No  Dietary orders reviewed? Yes Do you have support at home? Yes   Home Care and Equipment/Supplies: Were home health services ordered? Yes- PT/OT If so, what is the name of the agency? In house at [place] up a time to come to the patient's home? yes Were any new equipment or medical supplies ordered?  No What is the name of the medical supply agency? na Were you able to get the supplies/equipment? not applicable Do you have any questions related to the use of the equipment or supplies? No  Functional Questionnaire: (I = Independent and D = Dependent) ADLs: I  Bathing/Dressing- I  Meal Prep- D  Eating- D  Maintaining continence- I  Transferring/Ambulation- I  Managing Meds- D  Follow up appointments reviewed:  PCP Hospital f/u appt confirmed? No  Daughter states she will call back to make fu appt- I advised her to make it prior to 10-23-21 [facility] f/u appt confirmed? Yes  Scheduled to see Dr Barbarann Ehlers  on 10-14-21 @ 220pm. Are transportation arrangements needed? No  If their condition worsens, is the pt aware to call PCP or go to the Emergency Dept.? Yes Was the patient provided with contact information for the PCP's office or ED? Yes Was to pt encouraged to call back with questions or concerns? Yes

## 2021-10-13 NOTE — Telephone Encounter (Signed)
Will discuss during follow up visit. Thanks, BJ

## 2021-10-14 ENCOUNTER — Ambulatory Visit: Payer: HMO | Attending: Nurse Practitioner | Admitting: Nurse Practitioner

## 2021-10-14 ENCOUNTER — Encounter: Payer: Self-pay | Admitting: Nurse Practitioner

## 2021-10-14 VITALS — BP 124/70 | HR 74 | Ht 63.0 in | Wt 135.8 lb

## 2021-10-14 DIAGNOSIS — E785 Hyperlipidemia, unspecified: Secondary | ICD-10-CM | POA: Diagnosis not present

## 2021-10-14 DIAGNOSIS — J449 Chronic obstructive pulmonary disease, unspecified: Secondary | ICD-10-CM

## 2021-10-14 DIAGNOSIS — Z8673 Personal history of transient ischemic attack (TIA), and cerebral infarction without residual deficits: Secondary | ICD-10-CM

## 2021-10-14 DIAGNOSIS — I25119 Atherosclerotic heart disease of native coronary artery with unspecified angina pectoris: Secondary | ICD-10-CM

## 2021-10-14 DIAGNOSIS — I10 Essential (primary) hypertension: Secondary | ICD-10-CM | POA: Diagnosis not present

## 2021-10-14 DIAGNOSIS — E1169 Type 2 diabetes mellitus with other specified complication: Secondary | ICD-10-CM

## 2021-10-14 NOTE — Patient Instructions (Signed)
Medication Instructions:  Your physician recommends that you continue on your current medications as directed. Please refer to the Current Medication list given to you today.  *If you need a refill on your cardiac medications before your next appointment, please call your pharmacy*   Follow-Up: At [facility], you and your health needs are our priority.  As part of our continuing mission to provide you with exceptional heart care, we have created designated Provider Care Teams.  These Care Teams include your primary Cardiologist (physician) and Advanced Practice Providers (APPs -  Physician Assistants and Nurse Practitioners) who all work together to provide you with the care you need, when you need it.   Your next appointment:   6 month(s)  The format for your next appointment:   In Person  Provider:   Ambrose Pancoast, NP        Other Instructions You have been referred to pulmonology

## 2021-10-15 NOTE — Progress Notes (Unsigned)
HPI: Kathryn Lewis is a 76 y.o. female,  dementia type 2 diabetes, hyperlipidemia, hypertension, CKD stage IIIb, CAD, peripheral artery disease, vascular dementia, seizure disorder, OSA, and COPD here today with her daughter to follow on recent hospitalization. She was hospitalized from 09/30/2021 to 10/09/2021. TCM call on 10/12/21.  She presented to the ED complaining of productive cough. Chest CTA was negative for PE or pleural effusion.  She was found to have elevated troponins at 304, thought to be caused by demand ischemia.  Cardiology was consulted during hospitalization and recommended heparin drip. She is on Plavix 75 mg daily, Imdur 30 mg daily, Metoprolol tartrate 50 mg bid, and Rosuvastatin 40 mg daily. Denies any CP,palpitations,orthopnea,and PND.  COPD exacerbation: Initially treated with IV solumedrol and discharged on prednisone taper. She completed Amoxicillin treatment.  Lab Results  Component Value Date   WBC 15.8 (H) 10/09/2021   HGB 10.1 (L) 10/09/2021   HCT 29.7 (L) 10/09/2021   MCV 84.6 10/09/2021   PLT 292 10/09/2021   Lab Results  Component Value Date   WBC 15.8 (H) 10/09/2021   HGB 10.1 (L) 10/09/2021   HCT 29.7 (L) 10/09/2021   MCV 84.6 10/09/2021   PLT 292 10/09/2021   CKD III: She has not noted foam in urine,gross hematuria,or decreased urine output. BP was elevated during initial evaluation, 206/64. She is on Amlodipine 10 mg daily, Losartan 50 mg daily,and Hydralazine 25 mg tid,, spironolactone 25 mg daily. BP improved once respiratory symptoms improved.  Lab Results  Component Value Date   CREATININE 1.14 (H) 10/09/2021   BUN 31 (H) 10/09/2021   NA 134 (L) 10/09/2021   K 4.2 10/09/2021   CL 107 10/09/2021   CO2 19 (L) 10/09/2021   Her daughter received call from pulmonologist's office and she needs to call back to schedule follow up appt. She is now on Symbicort 160-4.5 mcg bid, which has a high co-pay. She denies having any  cough,SOB,or wheezing.   DM II on Lantus 20 U and insulin aspart per sliding scale. Her appetite is good. Lab Results  Component Value Date   HGBA1C 6.8 (H) 10/02/2021   Fall during hospitalization with minor head trauma. She got up to go to the bathroom with no assistance. Head CT 10/08/21:1. No acute intracranial process. 2. Atrophy with chronic microvascular ischemic changes and old infarct in the PCA territory on the right.  Having PT 2 times per week and OT once per week. She feels like she is back to her baseline.  Review of Systems  Constitutional:  Positive for fatigue. Negative for activity change, appetite change and fever.  HENT:  Negative for mouth sores, nosebleeds, sore throat and trouble swallowing.   Eyes:  Negative for redness and visual disturbance.  Respiratory:  Negative for chest tightness and stridor.   Cardiovascular:  Negative for leg swelling.  Gastrointestinal:  Negative for abdominal pain, nausea and vomiting.       Negative for changes in bowel habits.  Endocrine: Negative for polydipsia, polyphagia and polyuria.  Musculoskeletal:  Positive for gait problem.  Skin:  Negative for rash.  Neurological:  Negative for syncope and headaches.  Rest see pertinent positives and negatives per HPI.  Current Outpatient Medications on File Prior to Visit  Medication Sig Dispense Refill   albuterol (VENTOLIN HFA) 108 (90 Base) MCG/ACT inhaler Inhale 2 puffs into the lungs every 6 (six) hours as needed for wheezing or shortness of breath. 1 each 6   amLODipine (  NORVASC) 10 MG tablet Take 1 tablet (10 mg total) by mouth daily. 30 tablet 0   ASPIRIN LOW DOSE 81 MG tablet TAKE ONE TABLET BY MOUTH EVERY DAY 30 tablet 2   budesonide-formoterol (SYMBICORT) 160-4.5 MCG/ACT inhaler Inhale 2 puffs into the lungs 2 (two) times daily. 1 each 0   Cholecalciferol (GNP VITAMIN D MAXIMUM STRENGTH) 50 MCG (2000 UT) TABS TAKE ONE TABLET BY MOUTH EVERY DAY 30 tablet 0   clopidogrel  (PLAVIX) 75 MG tablet TAKE ONE TABLET BY MOUTH EVERY DAY 30 tablet 5   ezetimibe (ZETIA) 10 MG tablet TAKE ONE TABLET BY MOUTH EVERY DAY 30 tablet 5   FEROSUL 325 (65 Fe) MG tablet TAKE ONE TABLET BY MOUTH EVERY TWO DAYS with FOUR OUNCE of orange JUICE 100 tablet 0   fluticasone (FLONASE) 50 MCG/ACT nasal spray Place 2 sprays into both nostrils daily. 16 g 6   furosemide (LASIX) 20 MG tablet Take 1 tablet (20 mg total) by mouth daily as needed for edema. 30 tablet 3   GNP PAIN RELIEF EX-STRENGTH 500 MG tablet TAKE ONE TABLET BY MOUTH FOUR TIMES DAILY 120 tablet 0   guaiFENesin (MUCINEX) 600 MG 12 hr tablet Take 1 tablet (600 mg total) by mouth daily as needed for to loosen phlegm. 24 tablet 0   hydrALAZINE (APRESOLINE) 25 MG tablet Take 1 tablet (25 mg total) by mouth every 8 (eight) hours. 90 tablet 0   HYDROcodone-acetaminophen (NORCO/VICODIN) 5-325 MG tablet Take 1 tablet by mouth every 6 (six) hours as needed for moderate pain.     Insulin Aspart FlexPen 100 UNIT/ML SOPN INJECT subcutaneously THREE TIMES DAILY PER sliding scales BLOOD SUGAR (150 NO INSULIN)+ 151-180 Give FOUR units. 181-220 Give SIX units, 221-260 Give EIGHT units, 261-300 Give 10 units 301-340 Give 12 units AND > 341 Give 14 units 15 mL 1   ipratropium-albuterol (DUONEB) 0.5-2.5 (3) MG/3ML SOLN Take 3 mLs by nebulization 2 (two) times daily. 180 mL 0   isosorbide mononitrate (IMDUR) 30 MG 24 hr tablet TAKE ONE TABLET BY MOUTH EVERY DAY 30 tablet 5   lamoTRIgine (LAMICTAL) 200 MG tablet TAKE 1.5 TABLETS BY MOUTH TWICE DAILY 270 tablet 3   LANTUS SOLOSTAR 100 UNIT/ML Solostar Pen INJECT 20 units AT BEDTIME 15 mL 4   losartan (COZAAR) 50 MG tablet TAKE ONE TABLET BY MOUTH EVERY DAY 30 tablet 5   metoprolol tartrate (LOPRESSOR) 50 MG tablet TAKE ONE TABLET BY MOUTH TWICE DAILY 60 tablet 5   Omega-3 Fatty Acids (FISH OIL) 1000 MG CAPS TAKE TWO CAPSULES BY MOUTH EVERY DAY 60 capsule 5   phenytoin (DILANTIN) 100 MG ER capsule Take 3  capsules (300 mg total) by mouth daily. 270 capsule 3   phenytoin (DILANTIN) 30 MG ER capsule Take 1 capsule (30 mg total) by mouth at bedtime. 90 capsule 0   polyvinyl alcohol (ARTIFICIAL TEARS) 1.4 % ophthalmic solution Place 1 drop into both eyes in the morning and at bedtime. 15 mL 6   rosuvastatin (CRESTOR) 40 MG tablet TAKE ONE TABLET BY MOUTH EVERY DAY 30 tablet 5   Spacer/Aero Chamber Mouthpiece MISC 1 Device by Does not apply route daily as needed. 1 each 0   SPIRIVA HANDIHALER 18 MCG inhalation capsule place ONE capsule into inhaler AND inhale EVERY DAY 30 capsule 5   spironolactone (ALDACTONE) 25 MG tablet Take 1 tablet (25 mg total) by mouth daily. 30 tablet 5   tiZANidine (ZANAFLEX) 4 MG tablet TAKE 1/2 TABLET  BY MOUTH EVERY 12 HOURS AS NEEDED muscle SPASMS 30 tablet 1   predniSONE (DELTASONE) 20 MG tablet Take 2 tablets (40 mg total) by mouth daily with breakfast for 2 days, THEN 1.5 tablets (30 mg total) daily with breakfast for 2 days, THEN 1 tablet (20 mg total) daily with breakfast for 2 days, THEN 0.5 tablets (10 mg total) daily with breakfast for 2 days. 10 tablet 0   No current facility-administered medications on file prior to visit.   Past Medical History:  Diagnosis Date   Allergy    Arthritis    Bipolar 1 disorder ([place])    Blood transfusion without reported diagnosis    CAD (coronary artery disease) 04/17/2015   Carotid artery stenosis    50-69% bilateral ICA stenoses by velocity criteria but no visible plaque and ICA/CCA ratio 1.72 on right and 1.38 on left   Chronic airway obstruction, not elsewhere classified 03/02/2010   Overview:  Chronic Obstructive Pulmonary Disease   CKD (chronic kidney disease) stage 3, GFR 30-59 ml/min (HCC) 09/18/2021   Coronary artery disease    Depression    Diabetes mellitus without complication (HCC)    Echocardiogram    Echo 06/2018: Hyperdynamic systolic function, EF >07, mild concentric LVH, elevated LVEDP (E/E' suggests impaired  relaxation), mild AI, lipomatous interatrial septum   Hypertension    Left adrenal mass (HCC)    2.5 cm L adrenal mass on CT per note of Dr. Seleta Rhymes 05/20/2014   MI, old    PAD (peripheral artery disease) ([place])    03/2014 - CT aortogram with lower extremity runoff (mild to moderate disease in common iliac arteries, complete occulusion of her bilateral external iliac arteries with heavily disease common femoral arteries. She also has disease to her SFAs bilaterally. Popliteal arteries and tibial vessel runoff appears to be adequate)   Seizures (HCC)    Stroke (HCC)    Allergies  Allergen Reactions   Amoxicillin Other (See Comments)    colitis   Augmentin [Amoxicillin-Pot Clavulanate] Other (See Comments)    colitis   Cephalexin Other (See Comments)    Colitis - ceftriaxone ok   Social History   Socioeconomic History   Marital status: Single    Spouse name: Not on file   Number of children: 2   Years of education: Not on file   Highest education level: Not on file  Occupational History   Not on file  Tobacco Use   Smoking status: Former    Packs/day: 1.00    Types: Cigarettes    Quit date: 2015    Years since quitting: 8.7   Smokeless tobacco: Never  Vaping Use   Vaping Use: Never used  Substance and Sexual Activity   Alcohol use: No   Drug use: No   Sexual activity: Not on file  Other Topics Concern   Not on file  Social History Narrative   Pt lives in assisted living facility   Has 2 adult children, she is widowed, husband died in his early 68's in a work related accident   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   Social Determinants of Health   Financial Resource Strain: [place]  (11/27/2019)   Overall Financial Resource Strain (CARDIA)    Difficulty of Paying Living Expenses: Not hard at all  Food Insecurity: No Food Insecurity (10/05/2021)   Hunger Vital Sign    Worried About Running Out of Food in the Last Year: Never true  Ran Out of Food in the Last Year:  Never true  Transportation Needs: No Transportation Needs (10/05/2021)   PRAPARE - Hydrologist (Medical): No    Lack of Transportation (Non-Medical): No  Physical Activity: Inactive (11/27/2019)   Exercise Vital Sign    Days of Exercise per Week: 0 days    Minutes of Exercise per Session: 0 min  Stress: No Stress Concern Present (11/27/2019)   [place]    Feeling of Stress : Not at all  Social Connections: Socially Isolated (11/27/2019)   Social Connection and Isolation Panel [NHANES]    Frequency of Communication with Friends and Family: More than three times a week    Frequency of Social Gatherings with Friends and Family: More than three times a week    Attends Religious Services: Never    Marine scientist or Organizations: No    Attends Archivist Meetings: Never    Marital Status: Widowed   Vitals:   10/16/21 1432  BP: 125/85  Pulse: 70  Resp: 18  Temp: 98.3 F (36.8 C)  SpO2: 98%  Body mass index is 24.06 kg/m.  Physical Exam Vitals and nursing note reviewed.  Constitutional:      General: She is not in acute distress.    Appearance: She is well-developed and well-groomed.  HENT:     Head: Normocephalic and atraumatic.     Mouth/Throat:     Mouth: Mucous membranes are moist.     Pharynx: Oropharynx is clear.  Eyes:     Conjunctiva/sclera: Conjunctivae normal.  Cardiovascular:     Rate and Rhythm: Normal rate and regular rhythm.     Heart sounds: No murmur heard. Pulmonary:     Effort: Pulmonary effort is normal. No respiratory distress.     Breath sounds: Normal breath sounds.  Abdominal:     Palpations: Abdomen is soft. There is no mass.     Tenderness: There is no abdominal tenderness.  Skin:    General: Skin is warm.     Findings: No erythema or rash.  Neurological:     General: No focal deficit present.     Mental Status: She is alert. Mental  status is at baseline.     Cranial Nerves: No cranial nerve deficit.     Comments: She is in a wheel chair.  Psychiatric:        Mood and Affect: Mood and affect normal.   ASSESSMENT AND PLAN:  [name] was seen today for hospitalization follow-up.  Diagnoses and all orders for this visit: Orders Placed This Encounter  Procedures   CBC   BMP with eGFR([facility])   Protein Electrophoresis, Urine Rflx.   COPD exacerbation ([place]) Symptoms have resolved. Completed abx and prednisone taper. Continue Symbicort 160-4.5 mcg bid, instructed to swish after use. Pending for daughter to call back pulmonologist's office to arrange f/u appt.  Hypertension with heart disease BP adequately controlled. Continue Metoprolol tartrate 50 mg bid, Imdur 30 mg daily,Amlodipine 10 mg daily, Losartan 50 mg daily,and Hydralazine 25 mg tid, and spironolactone 25 mg daily. Continue low salt diet.  Elevated total protein Total protein has been elevated at 9.0 to 9.8.  We discussed possible etiologies. Her doughtier would like to proceed with further work up, urine protein electrophoresis ordered.  Stage 3a chronic kidney disease (HCC) Cr and e GFR back to baseline at then time of hospital discharge, 1.1 and 50 on  10/09/21. Continue adequate hydration and low salt diet. Adequate BP and glucose controlled. Continue Losartan.  Diabetes mellitus type 2 with neurological manifestations ([place]) HgA1C at goal. No changes in Lantus or insulin aspart dose. Continue annual eye exam, periodic dental and foot care. F/U in 5-6 months  Coronary artery disease involving native coronary artery of native heart with angina pectoris ([place]) Continue Imdur,plavix,aspirin 81 mg,and Rosuvastatin. She is to follow with cardiologist in 5-6 months.  Return in about 6 months (around 04/16/2022).  Ameya Vowell G. [place], MD  [facility]. [place] [employer] office.

## 2021-10-16 ENCOUNTER — Ambulatory Visit (INDEPENDENT_AMBULATORY_CARE_PROVIDER_SITE_OTHER): Payer: HMO | Admitting: Family Medicine

## 2021-10-16 ENCOUNTER — Encounter: Payer: Self-pay | Admitting: Family Medicine

## 2021-10-16 VITALS — BP 125/85 | HR 70 | Temp 98.3°F | Resp 18 | Ht 63.0 in

## 2021-10-16 DIAGNOSIS — R778 Other specified abnormalities of plasma proteins: Secondary | ICD-10-CM | POA: Diagnosis not present

## 2021-10-16 DIAGNOSIS — I119 Hypertensive heart disease without heart failure: Secondary | ICD-10-CM

## 2021-10-16 DIAGNOSIS — I25119 Atherosclerotic heart disease of native coronary artery with unspecified angina pectoris: Secondary | ICD-10-CM

## 2021-10-16 DIAGNOSIS — N1831 Chronic kidney disease, stage 3a: Secondary | ICD-10-CM

## 2021-10-16 DIAGNOSIS — J441 Chronic obstructive pulmonary disease with (acute) exacerbation: Secondary | ICD-10-CM | POA: Diagnosis not present

## 2021-10-16 DIAGNOSIS — E1149 Type 2 diabetes mellitus with other diabetic neurological complication: Secondary | ICD-10-CM

## 2021-10-16 NOTE — Patient Instructions (Addendum)
A few things to remember from today's visit:  COPD exacerbation ([place])  Hypertension with heart disease  Elevated total protein - Plan: Protein Electrophoresis, Urine Rflx.  Stage 3a chronic kidney disease ([place]) - Plan: Basic metabolic panel  No changes today. Please arrange appt with pulmonologist and in 5-6 months with cardiologist.   If you need refills for medications you take chronically, please call your pharmacy. Do not use My Chart to request refills or for acute issues that need immediate attention. If you send a my chart message, it may take a few days to be addressed, specially if I am not in the office.  Please be sure medication list is accurate. If a new problem present, please set up appointment sooner than planned today.

## 2021-10-19 ENCOUNTER — Other Ambulatory Visit: Payer: HMO

## 2021-10-20 ENCOUNTER — Telehealth: Payer: Self-pay | Admitting: Family Medicine

## 2021-10-20 DIAGNOSIS — E1149 Type 2 diabetes mellitus with other diabetic neurological complication: Secondary | ICD-10-CM

## 2021-10-20 MED ORDER — LANTUS SOLOSTAR 100 UNIT/ML ~~LOC~~ SOPN: PEN_INJECTOR | SUBCUTANEOUS | 4 refills | Status: DC

## 2021-10-20 MED ORDER — INSULIN ASPART FLEXPEN 100 UNIT/ML ~~LOC~~ SOPN: PEN_INJECTOR | SUBCUTANEOUS | 3 refills | Status: DC

## 2021-10-20 NOTE — Telephone Encounter (Signed)
Hinda Kehr with [facility]  [phone]  She is requesting a call back immediately to discuss Pt's insulin issues.  Pt is out of insulin and they need a call back ASAP!!  [place], [place] Phone:  [phone]  Fax:  [phone]      They can deliver it to Pt.

## 2021-10-20 NOTE — Telephone Encounter (Signed)
I spoke with Hinda Kehr, she is aware that Rx's have been sent into [facility].

## 2021-10-20 NOTE — Addendum Note (Signed)
Addended by: Rodrigo Ran on: 10/20/2021 02:39 PM   Modules accepted: Orders

## 2021-10-26 ENCOUNTER — Other Ambulatory Visit: Payer: Self-pay | Admitting: Neurology

## 2021-10-26 ENCOUNTER — Other Ambulatory Visit: Payer: Self-pay | Admitting: Family Medicine

## 2021-11-11 ENCOUNTER — Telehealth: Payer: Self-pay | Admitting: Family Medicine

## 2021-11-11 ENCOUNTER — Other Ambulatory Visit: Payer: Self-pay

## 2021-11-11 MED ORDER — BUDESONIDE-FORMOTEROL FUMARATE 160-4.5 MCG/ACT IN AERO: 2.0000 | INHALATION_SPRAY | Freq: Two times a day (BID) | RESPIRATORY_TRACT | 0 refills | Status: DC

## 2021-11-11 MED ORDER — HYDRALAZINE HCL 25 MG PO TABS: 25.0000 mg | ORAL_TABLET | Freq: Three times a day (TID) | ORAL | 0 refills | Status: DC

## 2021-11-11 MED ORDER — LAMOTRIGINE 200 MG PO TABS: ORAL_TABLET | ORAL | 0 refills | Status: DC

## 2021-11-11 NOTE — Telephone Encounter (Signed)
Kathryn Lewis with [facility]  [phone]  Called to request refills of the:  lamoTRIgine (LAMICTAL) 200 MG tablet  Expired - budesonide-formoterol (SYMBICORT) 160-4.5 MCG/ACT inhaler  hydrALAZINE (APRESOLINE) 25 MG tablet (Expired)  *Pt has been out of the seizure medication for 2 wks.  Please send to  [place], [place] Phone:  [phone]  Fax:  [phone]

## 2021-11-11 NOTE — Telephone Encounter (Signed)
Rxs sent

## 2021-11-11 NOTE — Telephone Encounter (Signed)
Lamictal was already sent by Dr. Amparo [place] office. Please advise on the Symbicort & Hydralazine; they were given to pt in the hospital.

## 2021-11-16 ENCOUNTER — Other Ambulatory Visit: Payer: Self-pay | Admitting: Family Medicine

## 2021-11-17 ENCOUNTER — Telehealth: Payer: Self-pay | Admitting: Family Medicine

## 2021-11-17 NOTE — Telephone Encounter (Signed)
VO given.

## 2021-11-17 NOTE — Telephone Encounter (Signed)
PT 1x2/1every 2 weeks/2

## 2021-11-17 NOTE — Telephone Encounter (Signed)
Verbal orders request OT 1x5

## 2021-11-18 NOTE — Telephone Encounter (Signed)
VO given to [place].

## 2021-11-24 ENCOUNTER — Ambulatory Visit: Payer: HMO | Admitting: Neurology

## 2021-11-24 ENCOUNTER — Encounter: Payer: Self-pay | Admitting: Neurology

## 2021-11-24 VITALS — BP 152/70 | HR 74 | Wt 132.0 lb

## 2021-11-24 DIAGNOSIS — G40209 Localization-related (focal) (partial) symptomatic epilepsy and epileptic syndromes with complex partial seizures, not intractable, without status epilepticus: Secondary | ICD-10-CM | POA: Diagnosis not present

## 2021-11-24 MED ORDER — PHENYTOIN SODIUM EXTENDED 100 MG PO CAPS: 300.0000 mg | ORAL_CAPSULE | Freq: Every day | ORAL | 3 refills | Status: DC

## 2021-11-24 MED ORDER — PHENYTOIN SODIUM EXTENDED 30 MG PO CAPS: ORAL_CAPSULE | ORAL | 3 refills | Status: DC

## 2021-11-24 MED ORDER — LAMOTRIGINE 200 MG PO TABS: ORAL_TABLET | ORAL | 3 refills | Status: DC

## 2021-11-24 NOTE — Progress Notes (Signed)
NEUROLOGY FOLLOW UP OFFICE NOTE  Kathryn Lewis [id number] 02-07-1945  HISTORY OF PRESENT ILLNESS: I had the pleasure of seeing Kathryn Lewis in follow-up in the neurology clinic on 11/24/2021.  The patient was last seen 76 years ago for seizures. She is again accompanied by her daughter Kathryn Lewis who provides additional information. Records and images were personally reviewed where available.  She lives at [facility] where medications are administered. She is on Lamotrigine '200mg'$  1.5 tabs BID ('300mg'$  BID) and Phenytoin '330mg'$  daily without side effects. They deny any seizures or seizure-like symptoms since 2016. No staring/unresponsive episodes. No headaches, dizziness. She and her daughter were surprised about the diagnosis of dementia on her last hospital admission for COPD exacerbation, viral URI. Kathryn Lewis reports that her cognitive changes from the stroke in 2015 have been stable since then. ALF staff administers medications. She is able to bathe herself, clean up her room, feed herself. She has a walker in her room and a wheelchair to use around the facility. She has low back pain.    History on Initial Assessment 03/18/2017: This is a pleasant 76 year old ambidextrous right-hand dominant woman with a history of hypertension, diabetes, peripheral vascular disease, COPD, sleep apnea on CPAP, CAD, bipolar disorder, stroke in 2015 with subsequent seizures, presenting to establish care. She had previously been seeing neurologist Dr. Chipper Herb in [place], [place], records unavailable for review. She and her daughter report that she had a stroke in 2015 which mostly affected her vision and memory. She cannot read. She can write her name and sometimes a word, but something would write a different word. The first seizure occurred in 2016, she recalls that everything got loud and mixed up, she tried calling her son but per daughter was "hollering out loud" then became unconscious. They report possibly only 2 seizures  at that time, no seizures since 2016. She is taking Lamotrigine '200mg'$  1.5 tabs BID and Dilantin '330mg'$  qhs with no side effects. She had previously been living with her daughter, but due to her daughter medical issues, the patient opted to move to [facility] last year. She has had memory issues since the stroke, her daughter manages bills. Medications are administered at [facility]. She does not drive. She reports bilateral leg pain and deep pain radiating to her lower back. She also has a diagnosis of sciatica, "I think the left one." She has peripheral artery disease and sees Vascular.    She denies any olfactory/gustatory hallucinations, deja vu, rising epigastric sensation, focal numbness/tingling/weakness, myoclonic jerks. She denies any headaches, dizziness, diplopia, neck/back pain, bowel/bladder dysfunction.    Epilepsy Risk Factors:  History of large right posterior temporal and medial occipital infarct with encephalomalacia and gliosis. Otherwise she had a normal birth and early development.  There is no history of febrile convulsions, CNS infections such as meningitis/encephalitis, significant traumatic brain injury, neurosurgical procedures, or family history of seizures.  PAST MEDICAL HISTORY: Past Medical History:  Diagnosis Date   Allergy    Arthritis    Bipolar 1 disorder ([place])    Blood transfusion without reported diagnosis    CAD (coronary artery disease) 04/17/2015   Carotid artery stenosis    50-69% bilateral ICA stenoses by velocity criteria but no visible plaque and ICA/CCA ratio 1.72 on right and 1.38 on left   Chronic airway obstruction, not elsewhere classified 03/02/2010   Overview:  Chronic Obstructive Pulmonary Disease   CKD (chronic kidney disease) stage 3, GFR 30-59 ml/min ([place]) 09/18/2021  Coronary artery disease    Depression    Diabetes mellitus without complication ([place])    Echocardiogram    Echo 06/2018: Hyperdynamic systolic function, EF >16, mild  concentric LVH, elevated LVEDP (E/E' suggests impaired relaxation), mild AI, lipomatous interatrial septum   Hypertension    Left adrenal mass (HCC)    2.5 cm L adrenal mass on CT per note of Dr. Seleta Rhymes 05/20/2014   MI, old    PAD (peripheral artery disease) ([place])    03/2014 - CT aortogram with lower extremity runoff (mild to moderate disease in common iliac arteries, complete occulusion of her bilateral external iliac arteries with heavily disease common femoral arteries. She also has disease to her SFAs bilaterally. Popliteal arteries and tibial vessel runoff appears to be adequate)   Seizures (HCC)    Stroke [facility])     MEDICATIONS: Current Outpatient Medications on File Prior to Visit  Medication Sig Dispense Refill   albuterol (VENTOLIN HFA) 108 (90 Base) MCG/ACT inhaler Inhale 2 puffs into the lungs every 6 (six) hours as needed for wheezing or shortness of breath. 1 each 6   amLODipine (NORVASC) 10 MG tablet Take 1 tablet (10 mg total) by mouth daily. 30 tablet 0   ASPIRIN LOW DOSE 81 MG tablet TAKE ONE TABLET BY MOUTH EVERY DAY 30 tablet 11   budesonide-formoterol (SYMBICORT) 160-4.5 MCG/ACT inhaler Inhale 2 puffs into the lungs 2 (two) times daily. 1 each 0   clopidogrel (PLAVIX) 75 MG tablet TAKE ONE TABLET BY MOUTH EVERY DAY 30 tablet 5   ezetimibe (ZETIA) 10 MG tablet TAKE ONE TABLET BY MOUTH EVERY DAY 30 tablet 5   FEROSUL 325 (65 Fe) MG tablet TAKE ONE TABLET BY MOUTH EVERY OTHER DAY (EVERY TWO DAYS) WITH FOUR OUNCES ORANGE JUICE 100 tablet 3   fluticasone (FLONASE) 50 MCG/ACT nasal spray Place 2 sprays into both nostrils daily. 16 g 6   furosemide (LASIX) 20 MG tablet Take 1 tablet (20 mg total) by mouth daily as needed for edema. 30 tablet 3   GNP PAIN RELIEF EX-STRENGTH 500 MG tablet TAKE ONE TABLET BY MOUTH FOUR TIMES DAILY 120 tablet 0   GNP VITAMIN D MAXIMUM STRENGTH 50 MCG (2000 UT) TABS TAKE ONE CAPSULE BY MOUTH EVERY DAY 30 tablet 11   hydrALAZINE (APRESOLINE) 25 MG tablet  Take 1 tablet (25 mg total) by mouth every 8 (eight) hours. 90 tablet 0   HYDROcodone-acetaminophen (NORCO/VICODIN) 5-325 MG tablet Take 1 tablet by mouth every 6 (six) hours as needed for moderate pain.     Insulin Aspart FlexPen (NOVOLOG) 100 UNIT/ML INJECT subcutaneously THREE TIMES DAILY PER sliding scales BLOOD SUGAR (150 NO INSULIN)+ 151-180 Give FOUR units. 181-220 Give SIX units, 221-260 Give EIGHT units, 261-300 Give 10 units 301-340 Give 12 units AND > 341 Give 14 units. Max dose of 36 units. 15 mL 3   insulin glargine (LANTUS SOLOSTAR) 100 UNIT/ML Solostar Pen INJECT 20 units AT BEDTIME 15 mL 4   ipratropium-albuterol (DUONEB) 0.5-2.5 (3) MG/3ML SOLN Take 3 mLs by nebulization 2 (two) times daily. 180 mL 0   isosorbide mononitrate (IMDUR) 30 MG 24 hr tablet TAKE ONE TABLET BY MOUTH EVERY DAY 30 tablet 5   lamoTRIgine (LAMICTAL) 200 MG tablet TAKE 1.5 TABLETS BY MOUTH TWICE DAILY 90 tablet 0   losartan (COZAAR) 50 MG tablet TAKE ONE TABLET BY MOUTH EVERY DAY 30 tablet 5   metoprolol tartrate (LOPRESSOR) 50 MG tablet TAKE ONE TABLET BY MOUTH TWICE DAILY  60 tablet 5   Omega-3 Fatty Acids (FISH OIL) 1000 MG CAPS TAKE TWO CAPSULES BY MOUTH EVERY DAY 60 capsule 5   phenytoin (DILANTIN) 100 MG ER capsule Take 3 capsules (300 mg total) by mouth daily. 270 capsule 0   phenytoin (DILANTIN) 30 MG ER capsule Take 1 capsule (30 mg total) by mouth at bedtime. 90 capsule 0   polyvinyl alcohol (ARTIFICIAL TEARS) 1.4 % ophthalmic solution Place 1 drop into both eyes in the morning and at bedtime. 15 mL 6   rosuvastatin (CRESTOR) 40 MG tablet TAKE ONE TABLET BY MOUTH EVERY DAY 30 tablet 5   Spacer/Aero Chamber Mouthpiece MISC 1 Device by Does not apply route daily as needed. 1 each 0   SPIRIVA HANDIHALER 18 MCG inhalation capsule place ONE capsule into inhaler AND inhale EVERY DAY 30 capsule 5   spironolactone (ALDACTONE) 25 MG tablet Take 1 tablet (25 mg total) by mouth daily. 30 tablet 5   tiZANidine  (ZANAFLEX) 4 MG tablet TAKE 1/2 TABLET BY MOUTH EVERY 12 HOURS AS NEEDED muscle SPASMS 30 tablet 1   No current facility-administered medications on file prior to visit.    ALLERGIES: Allergies  Allergen Reactions   Amoxicillin Other (See Comments)    colitis   Augmentin [Amoxicillin-Pot Clavulanate] Other (See Comments)    colitis   Cephalexin Other (See Comments)    Colitis - ceftriaxone ok    FAMILY HISTORY: Family History  Problem Relation Age of Onset   Hypertension Mother    Arthritis Mother    Diabetes Mother    Early death Mother    Hyperlipidemia Mother    Stroke Mother    Hypertension Father    Alcohol abuse Father    Early death Father    Hyperlipidemia Father    COPD Father    Heart attack Father    Hypertension Sister    Birth defects Brother    Depression Brother    Hyperlipidemia Brother    Hypertension Brother    Arthritis Daughter    Asthma Daughter    COPD Daughter    Arthritis Son    COPD Son    Hypertension Son    Hyperlipidemia Son    Heart attack Son    Arthritis Maternal Grandmother    Cancer Maternal Grandmother     SOCIAL HISTORY: Social History   Socioeconomic History   Marital status: Single    Spouse name: Not on file   Number of children: 2   Years of education: Not on file   Highest education level: Not on file  Occupational History   Not on file  Tobacco Use   Smoking status: Former    Packs/day: 1.00    Types: Cigarettes    Quit date: 2015    Years since quitting: 8.8   Smokeless tobacco: Never  Vaping Use   Vaping Use: Never used  Substance and Sexual Activity   Alcohol use: No   Drug use: No   Sexual activity: Not on file  Other Topics Concern   Not on file  Social History Narrative   Pt lives in assisted living facility   Has 2 adult children, she is widowed, husband died in his early 75's in a work related accident   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   Social Determinants of Health   Financial  Resource Strain: [place]  (11/27/2019)   Overall Financial Resource Strain (CARDIA)    Difficulty of Paying Living  Expenses: Not hard at all  Food Insecurity: No Food Insecurity (10/05/2021)   Hunger Vital Sign    Worried About Running Out of Food in the Last Year: Never true    Ran Out of Food in the Last Year: Never true  Transportation Needs: No Transportation Needs (10/05/2021)   PRAPARE - Hydrologist (Medical): No    Lack of Transportation (Non-Medical): No  Physical Activity: Inactive (11/27/2019)   Exercise Vital Sign    Days of Exercise per Week: 0 days    Minutes of Exercise per Session: 0 min  Stress: No Stress Concern Present (11/27/2019)   [place]    Feeling of Stress : Not at all  Social Connections: Socially Isolated (11/27/2019)   Social Connection and Isolation Panel [NHANES]    Frequency of Communication with Friends and Family: More than three times a week    Frequency of Social Gatherings with Friends and Family: More than three times a week    Attends Religious Services: Never    Marine scientist or Organizations: No    Attends Archivist Meetings: Never    Marital Status: Widowed  Intimate Partner Violence: Not At Risk (10/05/2021)   Humiliation, Afraid, Rape, and Kick questionnaire    Fear of Current or Ex-Partner: No    Emotionally Abused: No    Physically Abused: No    Sexually Abused: No     PHYSICAL EXAM: Vitals:   11/24/21 1448 11/24/21 1522  BP: (!) 203/77 (!) 152/70  Pulse: 74   SpO2: 99%    General: No acute distress Head:  Normocephalic/atraumatic Skin/Extremities: No rash, no edema Neurological Exam: alert and oriented to person. Month is June, does not know year. States current location is a nursing facility, does not know city/state. 0/3 delayed recall, unable to spell WORLD backwards, unable to read. Fund of knowledge is  reduced.Cranial nerves: Pupils equal, round. Extraocular movements intact with no nystagmus. Left homonymous hemianopia. No facial asymmetry.  Motor: Bulk and tone normal, muscle strength 5/5 throughout with no pronator drift.   Finger to nose testing intact.  Gait not tested, sitting on wheelchair, did not have walker today.    IMPRESSION: This is a 76 yo RH woman with a history of hypertension, diabetes, peripheral vascular disease, COPD, sleep apnea (refuses CPAP) CAD, bipolar disorder, stroke in 2015 with subsequent seizures. She remains seizure-free since 2016 on Phenytoin '330mg'$  daily and Lamotrigine '200mg'$  1.5 tabs BID ('300mg'$  BID), refills sent. We discussed their concern about being told she has dementia on last hospital visit. Today she is not oriented to place/time, 0/3 delayed recall. Cognitive changes have been stable since the stroke in 2015, we discussed different types of dementia, and that she does have vascular dementia. She states she just does not like the term "dementia." Continue close supervision. Follow-up in 1 year, call for any changes.    Thank you for allowing me to participate in her care.  Please do not hesitate to call for any questions or concerns.    Ellouise Newer, M.D.   CC: Dr. Martinique

## 2021-11-24 NOTE — Patient Instructions (Addendum)
Always good to see you. Continue Phenytoin '330mg'$  every night and Laomtrigine '200mg'$  1.5 tablets twice a day. Follow-up in 1 year, call for any changes   Seizure Precautions: 1. If medication has been prescribed for you to prevent seizures, take it exactly as directed.  Do not stop taking the medicine without talking to your doctor first, even if you have not had a seizure in a long time.   2. Avoid activities in which a seizure would cause danger to yourself or to others.  Don't operate dangerous machinery, swim alone, or climb in high or dangerous places, such as on ladders, roofs, or girders.  Do not drive unless your doctor says you may.  3. If you have any warning that you may have a seizure, lay down in a safe place where you can't hurt yourself.    4.  No driving for 6 months from last seizure, as per [facility].   Please refer to the following link on the [place] website for more information: http://[email or web address].org/[email or web address]/driving/drivingu.cfm   5.  Maintain good sleep hygiene. Avoid alcohol.  6.  Contact your doctor if you have any problems that may be related to the medicine you are taking.  7.  Call 911 and bring the patient back to the ED if:        A.  The seizure lasts longer than 5 minutes.       B.  The patient doesn't awaken shortly after the seizure  C.  The patient has new problems such as difficulty seeing, speaking or moving  D.  The patient was injured during the seizure  E.  The patient has a temperature over 102 F (39C)  F.  The patient vomited and now is having trouble breathing

## 2021-12-07 NOTE — Progress Notes (Signed)
No Show - please disregard.

## 2021-12-08 ENCOUNTER — Other Ambulatory Visit: Payer: Self-pay | Admitting: Family Medicine

## 2021-12-22 ENCOUNTER — Other Ambulatory Visit: Payer: Self-pay

## 2021-12-22 MED ORDER — METOPROLOL TARTRATE 50 MG PO TABS: 50.0000 mg | ORAL_TABLET | Freq: Two times a day (BID) | ORAL | 5 refills | Status: DC

## 2021-12-25 ENCOUNTER — Non-Acute Institutional Stay: Payer: PPO | Admitting: Hospice

## 2021-12-25 ENCOUNTER — Other Ambulatory Visit: Payer: Self-pay | Admitting: Family Medicine

## 2021-12-25 DIAGNOSIS — E1169 Type 2 diabetes mellitus with other specified complication: Secondary | ICD-10-CM

## 2021-12-25 DIAGNOSIS — R269 Unspecified abnormalities of gait and mobility: Secondary | ICD-10-CM

## 2021-12-25 DIAGNOSIS — Z515 Encounter for palliative care: Secondary | ICD-10-CM

## 2021-12-25 DIAGNOSIS — J449 Chronic obstructive pulmonary disease, unspecified: Secondary | ICD-10-CM

## 2021-12-25 NOTE — Progress Notes (Signed)
[facility] Consult Note Telephone: [phone]  Fax: [phone]  PATIENT NAME: Kathryn Lewis DOB: [birth year] MRN: [id number]  PRIMARY CARE PROVIDER:   Martinique, Betty G, MD Martinique, Betty G, MD [place],  [place] [address]  REFERRING PROVIDER: Martinique, Betty G, MD Martinique, Betty G, MD [place],  [place] [id number]  RESPONSIBLE PARTY:  [name] is the Legal guardian Contact Information     Name Relation Home Work Mobile   [place] Daughter   [phone]   Rilie, Glanz   [phone]       Visit is to build trust and highlight Palliative Medicine as specialized medical care for people living with serious illness, aimed at facilitating better quality of life through symptoms relief, assisting with advance care planning and complex medical decision making. NP spoke with [facility] during visit. She has no concerns. This is a follow up visit.  RECOMMENDATIONS/PLAN:   Advance Care Planning/Code Status: Patient is a Do Not Resuscitate.   Goals of Care: Goals of care include to maximize quality of life and symptom management.  Visit consisted of counseling and education dealing with the complex and emotionally intense issues of symptom management and palliative care in the setting of serious and potentially life-threatening illness.  Palliative care team will continue to support patient, patient's family, and medical team.  Symptom management/Plan:  COPD: Last exacerbation Sep 2023. Resolved. Continue Spiriva daily. Albuterol PRN.  Avoid triggers, encourage slow deep breathing. Type 2 DM: A1c 9/8//23. Continue insulin as ordered. Education reiterated on no concentrated sweets.  Repeat A1c 3-6 months. Negative for symptoms of hypoglycemia, polyuria, polydipsia, foot ulcers. Gait disturbance: Continue to use rolling walker for support. Fall precautions reiterated.   Follow up: Palliative care will  continue to follow for complex medical decision making, advance care planning, and clarification of goals. Return 6 weeks or prn. Encouraged to call provider sooner with any concerns.  CHIEF COMPLAINT: Palliative follow up  HISTORY OF PRESENT ILLNESS:  Kathryn Lewis a 76 y.o. female with multiple medical problems including COPD, type 2 diabetes mellitus, hypertension, hyperlipidemia, CAD status post CVA.  History obtained from review of EMR, discussion with primary team, family and/or patient. Records reviewed and summarized above. All 10 point systems reviewed and are negative Review and summarization of Epic records shows history from other than patient. Independent interpretation of tests and reviewed as needed, available labs, patient records, imaging, studies and related documents from the EMR.  Palliative Care was asked to follow this patient to help address complex decision making in the context of advance care planning and goals of care clarification.  PERTINENT MEDICATIONS:  Outpatient Encounter Medications as of 12/25/2021  Medication Sig   albuterol (VENTOLIN HFA) 108 (90 Base) MCG/ACT inhaler Inhale 2 puffs into the lungs every 6 (six) hours as needed for wheezing or shortness of breath.   amLODipine (NORVASC) 10 MG tablet Take 1 tablet (10 mg total) by mouth daily.   ASPIRIN LOW DOSE 81 MG tablet TAKE ONE TABLET BY MOUTH EVERY DAY   budesonide-formoterol (SYMBICORT) 160-4.5 MCG/ACT inhaler Inhale 2 puffs into the lungs 2 (two) times daily.   clopidogrel (PLAVIX) 75 MG tablet TAKE ONE TABLET BY MOUTH EVERY DAY   ezetimibe (ZETIA) 10 MG tablet TAKE ONE TABLET BY MOUTH EVERY DAY   FEROSUL 325 (65 Fe) MG tablet TAKE ONE TABLET BY MOUTH EVERY OTHER DAY (EVERY TWO DAYS) WITH FOUR OUNCES ORANGE JUICE  fluticasone (FLONASE) 50 MCG/ACT nasal spray Place 2 sprays into both nostrils daily.   furosemide (LASIX) 20 MG tablet Take 1 tablet (20 mg total) by mouth daily as needed for edema.   GNP  PAIN RELIEF EX-STRENGTH 500 MG tablet TAKE ONE TABLET BY MOUTH FOUR TIMES DAILY   GNP VITAMIN D MAXIMUM STRENGTH 50 MCG (2000 UT) TABS TAKE ONE CAPSULE BY MOUTH EVERY DAY   hydrALAZINE (APRESOLINE) 25 MG tablet Take 1 tablet (25 mg total) by mouth every 8 (eight) hours.   HYDROcodone-acetaminophen (NORCO/VICODIN) 5-325 MG tablet Take 1 tablet by mouth every 6 (six) hours as needed for moderate pain.   Insulin Aspart FlexPen (NOVOLOG) 100 UNIT/ML INJECT subcutaneously THREE TIMES DAILY PER sliding scales BLOOD SUGAR (150 NO INSULIN)+ 151-180 Give FOUR units. 181-220 Give SIX units, 221-260 Give EIGHT units, 261-300 Give 10 units 301-340 Give 12 units AND > 341 Give 14 units. Max dose of 36 units.   insulin glargine (LANTUS SOLOSTAR) 100 UNIT/ML Solostar Pen INJECT 20 units AT BEDTIME   ipratropium-albuterol (DUONEB) 0.5-2.5 (3) MG/3ML SOLN Take 3 mLs by nebulization 2 (two) times daily. (Patient not taking: Reported on 11/24/2021)   isosorbide mononitrate (IMDUR) 30 MG 24 hr tablet TAKE ONE TABLET BY MOUTH EVERY DAY   lamoTRIgine (LAMICTAL) 200 MG tablet TAKE 1.5 TABLETS BY MOUTH TWICE DAILY   losartan (COZAAR) 50 MG tablet TAKE ONE TABLET BY MOUTH EVERY DAY   metoprolol tartrate (LOPRESSOR) 50 MG tablet Take 1 tablet (50 mg total) by mouth 2 (two) times daily.   Omega-3 Fatty Acids (FISH OIL) 1000 MG CAPS TAKE TWO CAPSULES BY MOUTH EVERY DAY   phenytoin (DILANTIN) 100 MG ER capsule Take 3 capsules (300 mg total) by mouth daily. (Take with '30mg'$  tablet for total of '330mg'$  every night)   phenytoin (DILANTIN) 30 MG ER capsule Take 1 capsule (30 mg total) by mouth at bedtime (take with Dilantin '300mg'$  for total of '330mg'$  every night)   polyvinyl alcohol (ARTIFICIAL TEARS) 1.4 % ophthalmic solution Place 1 drop into both eyes in the morning and at bedtime.   rosuvastatin (CRESTOR) 40 MG tablet TAKE ONE TABLET BY MOUTH EVERY DAY   Spacer/Aero Chamber Mouthpiece MISC 1 Device by Does not apply route daily as  needed.   SPIRIVA HANDIHALER 18 MCG inhalation capsule place ONE capsule into inhaler AND inhale EVERY DAY   spironolactone (ALDACTONE) 25 MG tablet Take 1 tablet (25 mg total) by mouth daily.   tiZANidine (ZANAFLEX) 4 MG tablet TAKE 1/2 TABLET BY MOUTH EVERY 12 HOURS AS NEEDED muscle SPASMS   No facility-administered encounter medications on file as of 12/25/2021.    HOSPICE ELIGIBILITY/DIAGNOSIS: TBD  PAST MEDICAL HISTORY:  Past Medical History:  Diagnosis Date   Allergy    Arthritis    Bipolar 1 disorder ([place])    Blood transfusion without reported diagnosis    CAD (coronary artery disease) 04/17/2015   Carotid artery stenosis    50-69% bilateral ICA stenoses by velocity criteria but no visible plaque and ICA/CCA ratio 1.72 on right and 1.38 on left   Chronic airway obstruction, not elsewhere classified 03/02/2010   Overview:  Chronic Obstructive Pulmonary Disease   CKD (chronic kidney disease) stage 3, GFR 30-59 ml/min ([place]) 09/18/2021   Coronary artery disease    Depression    Diabetes mellitus without complication ([place])    Echocardiogram    Echo 06/2018: Hyperdynamic systolic function, EF >56, mild concentric LVH, elevated LVEDP (E/E' suggests impaired relaxation), mild  AI, lipomatous interatrial septum   Hypertension    Left adrenal mass (HCC)    2.5 cm L adrenal mass on CT per note of Dr. Seleta Rhymes 05/20/2014   MI, old    PAD (peripheral artery disease) ([place])    03/2014 - CT aortogram with lower extremity runoff (mild to moderate disease in common iliac arteries, complete occulusion of her bilateral external iliac arteries with heavily disease common femoral arteries. She also has disease to her SFAs bilaterally. Popliteal arteries and tibial vessel runoff appears to be adequate)   Seizures (HCC)    Stroke (HCC)      ALLERGIES:  Allergies  Allergen Reactions   Amoxicillin Other (See Comments)    colitis   Augmentin [Amoxicillin-Pot Clavulanate] Other (See Comments)    colitis    Cephalexin Other (See Comments)    Colitis - ceftriaxone ok    I spent 45 minutes providing this consultation; time includes spent with patient/family, chart review and documentation. More than 50% of the time in this consultation was spent on care coordination  Thank you for the opportunity to participate in the care of [place] Please call our office at [phone] if we can be of additional assistance.  Note: Portions of this note were generated with Lobbyist. Dictation errors may occur despite best attempts at proofreading.  Teodoro Spray, NP

## 2021-12-29 ENCOUNTER — Other Ambulatory Visit: Payer: Self-pay | Admitting: Family Medicine

## 2022-01-01 ENCOUNTER — Telehealth: Payer: Self-pay | Admitting: Family Medicine

## 2022-01-01 NOTE — Telephone Encounter (Signed)
Spoke to patient daughter [name] to schedule Medicare Annual Wellness Visit (AWV) either virtually or in office.   She stated they had death in family and wanted a call back in jan 2024   Last AWV  11/27/19 please schedule with Nurse Health Adviser   45 min for awv-i and in office appointments 30 min for awv-s  phone/virtual appointments

## 2022-01-04 ENCOUNTER — Other Ambulatory Visit: Payer: Self-pay | Admitting: Family Medicine

## 2022-01-07 ENCOUNTER — Encounter (HOSPITAL_BASED_OUTPATIENT_CLINIC_OR_DEPARTMENT_OTHER): Payer: Self-pay | Admitting: Emergency Medicine

## 2022-01-07 ENCOUNTER — Emergency Department (HOSPITAL_BASED_OUTPATIENT_CLINIC_OR_DEPARTMENT_OTHER)
Admission: EM | Admit: 2022-01-07 | Discharge: 2022-01-07 | Disposition: A | Discharge status: Home / Self Care | Payer: HMO | Attending: Emergency Medicine | Admitting: Emergency Medicine

## 2022-01-07 ENCOUNTER — Telehealth: Payer: Self-pay | Admitting: Family Medicine

## 2022-01-07 ENCOUNTER — Emergency Department (HOSPITAL_BASED_OUTPATIENT_CLINIC_OR_DEPARTMENT_OTHER): Payer: HMO

## 2022-01-07 ENCOUNTER — Other Ambulatory Visit: Payer: Self-pay

## 2022-01-07 DIAGNOSIS — F0390 Unspecified dementia without behavioral disturbance: Secondary | ICD-10-CM | POA: Insufficient documentation

## 2022-01-07 DIAGNOSIS — J449 Chronic obstructive pulmonary disease, unspecified: Secondary | ICD-10-CM | POA: Insufficient documentation

## 2022-01-07 DIAGNOSIS — Z7902 Long term (current) use of antithrombotics/antiplatelets: Secondary | ICD-10-CM | POA: Diagnosis not present

## 2022-01-07 DIAGNOSIS — I2510 Atherosclerotic heart disease of native coronary artery without angina pectoris: Secondary | ICD-10-CM | POA: Diagnosis not present

## 2022-01-07 DIAGNOSIS — W19XXXA Unspecified fall, initial encounter: Secondary | ICD-10-CM | POA: Diagnosis not present

## 2022-01-07 DIAGNOSIS — Z7982 Long term (current) use of aspirin: Secondary | ICD-10-CM | POA: Insufficient documentation

## 2022-01-07 DIAGNOSIS — M542 Cervicalgia: Secondary | ICD-10-CM | POA: Diagnosis present

## 2022-01-07 DIAGNOSIS — N189 Chronic kidney disease, unspecified: Secondary | ICD-10-CM | POA: Insufficient documentation

## 2022-01-07 DIAGNOSIS — Z794 Long term (current) use of insulin: Secondary | ICD-10-CM | POA: Insufficient documentation

## 2022-01-07 DIAGNOSIS — Z79899 Other long term (current) drug therapy: Secondary | ICD-10-CM | POA: Insufficient documentation

## 2022-01-07 DIAGNOSIS — R519 Headache, unspecified: Secondary | ICD-10-CM | POA: Diagnosis not present

## 2022-01-07 NOTE — Telephone Encounter (Signed)
Melissa (OT)  with [facility]  [phone] [place] to leave a detailed message on this line  Verbal Order - OT  1 x a wk for 6 wks To increase independence and safety

## 2022-01-07 NOTE — Telephone Encounter (Signed)
Calling to report patient fell out of her wheelchair. Was transported to hospital diagnosed with severe cervical sprain. No change in meds

## 2022-01-07 NOTE — ED Triage Notes (Signed)
Pt arrives to ED via [facility] for fall. Pt notes that she fell backwards. She reports pain to her neck.

## 2022-01-07 NOTE — ED Provider Notes (Signed)
[facility] EMERGENCY DEPT Provider Note   CSN: [id number] Arrival date & time: 01/07/22  7341     History  Chief Complaint  Patient presents with   Kathryn Lewis is a 76 y.o. female history includes dementia, CAD, CKD, diabetes, CVA, hypertension, bipolar 1, COPD, on Plavix.  History per patient: Patient reports that she fell backwards this morning in her room.  She reports that she has bilateral posterior neck pain since that time she describes as aching and mild constant worse with palpation improves with rest.  She reports a mild posterior headache as well with her pain.  She denies loss of consciousness.  She denies pain of the chest, back, abdomen, pelvis or extremities.  She denies numbness, tingling, weakness.  She denies any additional injuries or concerns.  Level 5 caveat history of dementia.  History per Kathryn Lewis (facility RN over the phone): Kathryn Lewis was out to the patient's room today, the patient fell Kathryn Lewis walked in and found the patient laying on her back.  Patient was complaining of neck pain at that time.  Patient reports that they checked the patient's blood sugar and it was 113.  RN was also concerned about some redness of the patient's eyes over the past week.  Patient without other injuries or complaints today.  HPI     Home Medications Prior to Admission medications   Medication Sig Start Date End Date Taking? Authorizing Provider  albuterol (VENTOLIN HFA) 108 (90 Base) MCG/ACT inhaler Inhale 2 puffs into the lungs every 6 (six) hours as needed for wheezing or shortness of breath. 11/28/20   Martinique, Betty G, MD  amLODipine (NORVASC) 10 MG tablet Take 1 tablet (10 mg total) by mouth daily. 12/08/21   Martinique, Betty G, MD  ASPIRIN LOW DOSE 81 MG tablet TAKE ONE TABLET BY MOUTH EVERY DAY 10/26/21   Martin[place], Betty G, MD  budesonide-formoterol [facility]) 160-4.5 MCG/ACT inhaler Inhale 2 puffs into the lungs 2 (two) times daily. 12/29/21   Martin[place], Betty G,  MD  clopidogrel (PLAVIX) 75 MG tablet TAKE ONE TABLET BY MOUTH EVERY DAY 03/18/21   [place], Betty G, MD  ezetimibe (ZETIA) 10 MG tablet TAKE ONE TABLET BY MOUTH EVERY DAY 09/25/21   Martin[place], Betty G, MD  FEROSUL 325 (65 Fe) MG tablet TAKE ONE TABLET BY MOUTH EVERY OTHER DAY (EVERY TWO DAYS) WITH FOUR OUNCES ORANGE JUICE 10/26/21   Martin[place], Betty G, MD  fluticasone [facility]) 50 MCG/ACT nasal spray Place 2 sprays into both nostrils daily. 04/22/17   Nafziger, Tommi Rumps, NP  furosemide (LASIX) 20 MG tablet Take 1 tablet (20 mg total) by mouth daily as needed for edema. 01/04/22   Martinique, Betty G, MD  GNP PAIN RELIEF EX-STRENGTH 500 MG tablet TAKE ONE TABLET BY MOUTH FOUR TIMES DAILY 08/19/21   Martinique, Betty G, MD  [facility] VITAMIN D MAXIMUM STRENGTH 50 MCG (2000 UT) TABS TAKE ONE CAPSULE BY MOUTH EVERY DAY 10/26/21   [place], Betty G, MD  hydrALAZINE (APRESOLINE) 25 MG tablet Take 1 tablet (25 mg total) by mouth every 8 (eight) hours. 12/25/21 01/24/22  [place], Betty G, MD  HYDROcodone-acetaminophen (NORCO/VICODIN) 5-325 MG tablet Take 1 tablet by mouth every 6 (six) hours as needed for moderate pain.    [provider]  Insulin Aspart FlexPen (NOVOLOG) 100 UNIT/ML INJECT subcutaneously THREE TIMES DAILY PER sliding scales BLOOD SUGAR (150 NO INSULIN)+ 151-180 Give FOUR units. 181-220 Give SIX units, 221-260 Give EIGHT units, 261-300 Give  10 units 301-340 Give 12 units AND > 341 Give 14 units. Max dose of 36 units. 10/20/21   [place], Betty G, MD  insulin glargine (LANTUS SOLOSTAR) 100 UNIT/ML Solostar Pen INJECT 20 units AT BEDTIME 10/20/21   [place], Betty G, MD  ipratropium-albuterol (DUONEB) 0.5-2.5 (3) MG/3ML SOLN Take 3 mLs by nebulization 2 (two) times daily. Patient not taking: Reported on 11/24/2021 10/09/21 11/08/21  Alma Friendly, MD  isosorbide mononitrate (IMDUR) 30 MG 24 hr tablet TAKE ONE TABLET BY MOUTH EVERY DAY 09/29/21   [place], Betty G, MD  lamoTRIgine (LAMICTAL) 200 MG tablet TAKE 1.5 TABLETS BY  MOUTH TWICE DAILY 11/24/21   Cameron Sprang, MD  losartan (COZAAR) 50 MG tablet TAKE ONE TABLET BY MOUTH EVERY DAY 09/25/21   Martinique, Betty G, MD  metoprolol tartrate (LOPRESSOR) 50 MG tablet Take 1 tablet (50 mg total) by mouth 2 (two) times daily. 12/22/21   [place], Betty G, MD  Omega-3 Fatty Acids (Clarks) 1000 MG CAPS TAKE TWO CAPSULES BY MOUTH EVERY DAY 09/26/20   [place], Betty G, MD  phenytoin (DILANTIN) 100 MG ER capsule Take 3 capsules (300 mg total) by mouth daily. (Take with '30mg'$  tablet for total of '330mg'$  every night) 11/24/21   Cameron Sprang, MD  phenytoin (DILANTIN) 30 MG ER capsule Take 1 capsule (30 mg total) by mouth at bedtime (take with Dilantin '300mg'$  for total of '330mg'$  every night) 11/24/21   Cameron Sprang, MD  polyvinyl alcohol (ARTIFICIAL TEARS) 1.4 % ophthalmic solution Place 1 drop into both eyes in the morning and at bedtime. 04/22/21   [place], Betty G, MD  rosuvastatin (CRESTOR) 40 MG tablet TAKE ONE TABLET BY MOUTH EVERY DAY 08/04/21   [place], Betty G, MD  Spacer/Aero Chamber Mouthpiece MISC 1 Device by Does not apply route daily as needed. 12/24/16   Rigoberto Noel, MD  SPIRIVA HANDIHALER 18 MCG inhalation capsule place ONE capsule into inhaler AND inhale EVERY DAY 07/07/21   [place], Betty G, MD  spironolactone (ALDACTONE) 25 MG tablet Take 1 tablet (25 mg total) by mouth daily. 09/22/21   [place], Betty G, MD  tiZANidine (ZANAFLEX) 4 MG tablet TAKE 1/2 TABLET BY MOUTH EVERY 12 HOURS AS NEEDED muscle SPASMS 05/04/21   [place], Betty G, MD      Allergies    Amoxicillin, Augmentin [amoxicillin-pot clavulanate], and Cephalexin    Review of Systems   Review of Systems  Unable to perform ROS: Dementia    Physical Exam Updated Vital Signs BP (!) 149/83   Pulse 64   Temp 98.3 F (36.8 C) (Temporal)   Resp 14   LMP 03/17/2015   SpO2 100%  Physical Exam Constitutional:      General: She is not in acute distress.    Appearance: Normal appearance. She is well-developed.  She is not ill-appearing or diaphoretic.  HENT:     Head: Normocephalic and atraumatic.     Jaw: There is normal jaw occlusion.     Right Ear: External ear normal. No hemotympanum.     Left Ear: External ear normal. No hemotympanum.     Nose: Nose normal.     Mouth/Throat:     Mouth: Mucous membranes are moist.     Pharynx: Oropharynx is clear.  Eyes:     General: Vision grossly intact. Gaze aligned appropriately.     Extraocular Movements: Extraocular movements intact.     Conjunctiva/sclera: Conjunctivae normal.     Pupils: Pupils are  equal, round, and reactive to light.     Comments: No acute conjunctival erythema.  PERRLA.  Full EOMI without pain.  No periorbital swelling.  No discharge.  No photophobia or contralateral photophobia.  Patient reports loss of left-sided vision due to prior CVA which is unchanged.  Neck:     Trachea: Trachea and phonation normal.     Comments: Bilateral trapezius muscular tenderness palpation.  No midline spinal tenderness palpation.  No crepitus step-off deformity of the cervical spine. Pulmonary:     Effort: Pulmonary effort is normal. No respiratory distress.  Chest:     Chest wall: No tenderness or crepitus.  Abdominal:     General: There is no distension.     Palpations: Abdomen is soft.     Tenderness: There is no abdominal tenderness. There is no guarding or rebound.     Comments: No overlying skin changes or evidence of injury  Musculoskeletal:        General: Normal range of motion.     Cervical back: Normal range of motion.     Comments: No midline C/T/L tenderness palpation.  Mild bilateral trapezius muscular tenderness palpation.  No parathoracic or paralumbar muscular chest palpation.  No crepitus step-off or deformity the spine.  All major joints of the bilateral upper and lower extremities palpated and brought through range of motion without pain or difficulty.  Patient is able to bilateral knee-to-chest without pain.  She is able to  pull to seated position without assistance or difficulty.  Pelvis is stable to compression without pain.  No pain with palpation of the hips or legs.  Patient is ambulatory with minimal assistance.  No pain with ambulation.  Skin:    General: Skin is warm and dry.  Neurological:     Mental Status: She is alert.     GCS: GCS eye subscore is 4. GCS verbal subscore is 5. GCS motor subscore is 6.     Comments: Speech is clear and goal oriented, follows commands Major Cranial nerves without deficit, no facial droop Moves extremities without ataxia, coordination intact  Psychiatric:        Behavior: Behavior normal.    ED Results / Procedures / Treatments   Labs (all labs ordered are listed, but only abnormal results are displayed) Labs Reviewed - No data to display  EKG None  Radiology CT Head Wo Contrast  Result Date: 01/07/2022 CLINICAL DATA:  Trauma EXAM: CT HEAD WITHOUT CONTRAST CT CERVICAL SPINE WITHOUT CONTRAST TECHNIQUE: Multidetector CT imaging of the head and cervical spine was performed following the standard protocol without intravenous contrast. Multiplanar CT image reconstructions of the cervical spine were also generated. RADIATION DOSE REDUCTION: This exam was performed according to the departmental dose-optimization program which includes automated exposure control, adjustment of the mA and/or kV according to patient size and/or use of iterative reconstruction technique. COMPARISON:  CT head 06/11/21, CT Spine 06/11/21 FINDINGS: CT HEAD FINDINGS Brain: No evidence of acute infarction, hemorrhage, hydrocephalus, extra-axial collection or mass lesion/mass effect. Redemonstrated sequela of a prior right PCA territory infarct with encephalomalacia and volume loss in the right occipital and posterior temporal lobes. There is ex vacuo dilatation of the right occipital horn. Vascular: No hyperdense vessel or unexpected calcification. Skull: Normal. Negative for fracture or focal lesion.  Sinuses/Orbits: Bilateral lens replacement. Other: None. CT CERVICAL SPINE FINDINGS Alignment: There is straightening of the normal cervical lordosis. Grade 1 anterolisthesis of C3 on C4 and C4 on C5. This is unchanged compared  to 06/11/2021 Skull base and vertebrae: Postsurgical changes from C5-C6 ACDF. There is fusion of the facet joints at the C2-C3 level. There is fusion of the vertebral bodies at the ACDF level. Soft tissues and spinal canal: No prevertebral fluid or swelling. No visible canal hematoma. Disc levels: There is persistent moderate spinal canal narrowing at the C5-C6 level secondary to osseous hypertrophy versus OPLL. Upper chest: Negative Other: Redemonstrated severe calcified atherosclerotic plaque of the left internal carotid artery. IMPRESSION: 1. No acute intracranial abnormality. 2. No acute fracture or traumatic listhesis of the cervical spine. 3. Redemonstrated sequela of a prior right PCA territory infarct. 4. Postsurgical changes from C5-C6 ACDF. Persistent moderate spinal canal narrowing at the C5-C6, unchanged. Electronically Signed   By: Marin Roberts M.D.   On: 01/07/2022 10:42   CT Cervical Spine Wo Contrast  Result Date: 01/07/2022 CLINICAL DATA:  Trauma EXAM: CT HEAD WITHOUT CONTRAST CT CERVICAL SPINE WITHOUT CONTRAST TECHNIQUE: Multidetector CT imaging of the head and cervical spine was performed following the standard protocol without intravenous contrast. Multiplanar CT image reconstructions of the cervical spine were also generated. RADIATION DOSE REDUCTION: This exam was performed according to the departmental dose-optimization program which includes automated exposure control, adjustment of the mA and/or kV according to patient size and/or use of iterative reconstruction technique. COMPARISON:  CT head 06/11/21, CT Spine 06/11/21 FINDINGS: CT HEAD FINDINGS Brain: No evidence of acute infarction, hemorrhage, hydrocephalus, extra-axial collection or mass lesion/mass effect.  Redemonstrated sequela of a prior right PCA territory infarct with encephalomalacia and volume loss in the right occipital and posterior temporal lobes. There is ex vacuo dilatation of the right occipital horn. Vascular: No hyperdense vessel or unexpected calcification. Skull: Normal. Negative for fracture or focal lesion. Sinuses/Orbits: Bilateral lens replacement. Other: None. CT CERVICAL SPINE FINDINGS Alignment: There is straightening of the normal cervical lordosis. Grade 1 anterolisthesis of C3 on C4 and C4 on C5. This is unchanged compared to 06/11/2021 Skull base and vertebrae: Postsurgical changes from C5-C6 ACDF. There is fusion of the facet joints at the C2-C3 level. There is fusion of the vertebral bodies at the ACDF level. Soft tissues and spinal canal: No prevertebral fluid or swelling. No visible canal hematoma. Disc levels: There is persistent moderate spinal canal narrowing at the C5-C6 level secondary to osseous hypertrophy versus OPLL. Upper chest: Negative Other: Redemonstrated severe calcified atherosclerotic plaque of the left internal carotid artery. IMPRESSION: 1. No acute intracranial abnormality. 2. No acute fracture or traumatic listhesis of the cervical spine. 3. Redemonstrated sequela of a prior right PCA territory infarct. 4. Postsurgical changes from C5-C6 ACDF. Persistent moderate spinal canal narrowing at the C5-C6, unchanged. Electronically Signed   By: Marin Roberts M.D.   On: 01/07/2022 10:42    Procedures Procedures    Medications Ordered in ED Medications - No data to display  ED Course/ Medical Decision Making/ A&P Clinical Course as of 01/07/22 1314  Thu Jan 07, 2022  0959 Kathryn Haw RN [BM]  3532 CT Head Wo Contrast I personally reviewed and interpreted patient's CT head.  I do not appreciate any obvious intracranial hemorrhage. [BM]  1044 CT Cervical Spine Wo Contrast I personally reviewed and interpreted patient CT cervical spine.  She has diffuse degenerative  disc disease along with prior fusion of C5-6.  I do not appreciate any obvious acute fracture or traumatic spondylolisthesis. [BM]    Clinical Course User Index [BM] Deliah Boston, PA-C  Medical Decision Making 76 year old female with history as above presented for evaluation of left-sided neck pain after fall at assisted living facility earlier today.  RN found patient laying on her back.  Patient reports left-sided neck pain she denies any additional concerns.  She is on Plavix reportedly.  She has no headache or vision changes.  She has no pain with palpation of the cervical spine.  No pain with palpation of the back chest abdomen pelvis or extremities.  She is ambulatory without difficulty or pain.  She has full ROM of all major joints of bilateral upper and lower extremities without pain.  Will obtain CT head and cervical spine given patient's age and anticoagulation.   Amount and/or Complexity of Data Reviewed Radiology: ordered. Decision-making details documented in ED Course.    Details: CT head and cervical spine  Risk Risk Details: Patient's workup today is overall reassuring.  CT head and cervical spine without without acute findings.  She has a reassuring physical examination today.  Notification for imaging of the chest abdomen pelvis or extremities.  She has good range of motion and strength of all joints and is able to ambulate without pain or difficulty.  This appears to be a mechanical fall today, low suspicion for cardiac cause of her fall or from hypoglycemia.  There is no indication for blood work or further imaging at this point.  Finally RN at facility did note some concern for conjunctival erythema on my exam I do not appreciate any significant conjunctival erythema.  Low suspicion for uveitis, acute glaucoma, facial trauma, orbital cellulitis, corneal ulcer or other emergent pathologies at this time.  Patient appears stable for discharge back to  facility.  No evidence for cauda equina, myelopathy, dissection, fracture or other emergent causes of neck pain.      At this time there does not appear to be any evidence of an acute emergency medical condition and the patient appears stable for discharge with appropriate outpatient follow up. Diagnosis was discussed with patient who verbalizes understanding of care plan and is agreeable to discharge. I have discussed return precautions with patient who verbalizes understanding. Patient encouraged to follow-up with their PCP. All questions answered.  Patient's seen and evaluated with Dr. Ashok Cordia who agrees with plan to discharge with follow-up.   Note: Portions of this report may have been transcribed using voice recognition software. Every effort was made to ensure accuracy; however, inadvertent computerized transcription errors may still be present.         Final Clinical Impression(s) / ED Diagnoses Final diagnoses:  Fall, initial encounter  Neck pain    Rx / DC Orders ED Discharge Orders     None         Gari Crown 01/07/22 1315    Lajean Saver, MD 01/07/22 1321

## 2022-01-07 NOTE — ED Notes (Signed)
PTAR at bedside 

## 2022-01-07 NOTE — ED Notes (Signed)
11:59 Kathryn Lewis at [facility] will send transport. Patient resides at [facility].-ABB(NS)

## 2022-01-07 NOTE — Discharge Instructions (Addendum)
At this time there does not appear to be the presence of an emergent medical condition, however there is always the potential for conditions to change. Please read and follow the below instructions.  Please return to the Emergency Department immediately for any new or worsening symptoms Please be sure to follow up with your Primary Care Provider within one week regarding your visit today; please call their office to schedule an appointment even if you are feeling better for a follow-up visit. Your CT scan showed degenerative changes of your spine along with some narrowing of your spinal canal.  Please discuss this with your primary care provider and neurosurgeon.  Please read the additional information packets attached to your discharge summary.  Go to the nearest Emergency Department immediately if: You have fever or chills You have shortness of breath. You have light-headedness or you faint. You have chest pain. You have these eye or vision changes: Sudden vision loss or double vision. Your eye suddenly turns red. The black center of your eye (pupil) is an odd shape or size. You develop numbness, tingling, or weakness in any part of your body. You cannot move a part of your body (you have paralysis). You have neck pain along with severe dizziness or headache. You have numbness to genitals or pee or poop on yourself (incontinence) You have any new/concerning or worsening of symptoms.  Do not take your medicine if  develop an itchy rash, swelling in your mouth or lips, or difficulty breathing; call 911 and seek immediate emergency medical attention if this occurs.  You may review your lab tests and imaging results in their entirety on your MyChart account.  Please discuss all results of fully with your primary care provider and other specialist at your follow-up visit.  Note: Portions of this text may have been transcribed using voice recognition software. Every effort was made to ensure  accuracy; however, inadvertent computerized transcription errors may still be present.

## 2022-01-08 NOTE — Telephone Encounter (Signed)
Patient will be having OT & PT.

## 2022-01-08 NOTE — Telephone Encounter (Signed)
Verbal order given to [place]. Verbal given to add PT due to fall yesterday.

## 2022-01-19 ENCOUNTER — Other Ambulatory Visit: Payer: Self-pay | Admitting: Family Medicine

## 2022-01-19 DIAGNOSIS — J441 Chronic obstructive pulmonary disease with (acute) exacerbation: Secondary | ICD-10-CM

## 2022-01-20 ENCOUNTER — Telehealth: Payer: Self-pay | Admitting: Family Medicine

## 2022-01-20 NOTE — Telephone Encounter (Signed)
VO left on [employer].

## 2022-01-20 NOTE — Telephone Encounter (Signed)
Richardson Landry PT with [facility] is calling and need PT orders for 1x1, once  every 2 wk for 4 wks

## 2022-01-26 ENCOUNTER — Non-Acute Institutional Stay: Payer: PPO | Admitting: Hospice

## 2022-01-26 DIAGNOSIS — R269 Unspecified abnormalities of gait and mobility: Secondary | ICD-10-CM

## 2022-01-26 DIAGNOSIS — I252 Old myocardial infarction: Secondary | ICD-10-CM | POA: Diagnosis not present

## 2022-01-26 DIAGNOSIS — Z7982 Long term (current) use of aspirin: Secondary | ICD-10-CM | POA: Diagnosis not present

## 2022-01-26 DIAGNOSIS — N1832 Chronic kidney disease, stage 3b: Secondary | ICD-10-CM | POA: Diagnosis not present

## 2022-01-26 DIAGNOSIS — E782 Mixed hyperlipidemia: Secondary | ICD-10-CM | POA: Diagnosis not present

## 2022-01-26 DIAGNOSIS — Z7951 Long term (current) use of inhaled steroids: Secondary | ICD-10-CM | POA: Diagnosis not present

## 2022-01-26 DIAGNOSIS — E1151 Type 2 diabetes mellitus with diabetic peripheral angiopathy without gangrene: Secondary | ICD-10-CM | POA: Diagnosis not present

## 2022-01-26 DIAGNOSIS — E7849 Other hyperlipidemia: Secondary | ICD-10-CM | POA: Diagnosis not present

## 2022-01-26 DIAGNOSIS — E1169 Type 2 diabetes mellitus with other specified complication: Secondary | ICD-10-CM | POA: Diagnosis not present

## 2022-01-26 DIAGNOSIS — I5032 Chronic diastolic (congestive) heart failure: Secondary | ICD-10-CM | POA: Diagnosis not present

## 2022-01-26 DIAGNOSIS — Z515 Encounter for palliative care: Secondary | ICD-10-CM

## 2022-01-26 DIAGNOSIS — Z794 Long term (current) use of insulin: Secondary | ICD-10-CM | POA: Diagnosis not present

## 2022-01-26 DIAGNOSIS — J449 Chronic obstructive pulmonary disease, unspecified: Secondary | ICD-10-CM

## 2022-01-26 DIAGNOSIS — Z7902 Long term (current) use of antithrombotics/antiplatelets: Secondary | ICD-10-CM | POA: Diagnosis not present

## 2022-01-26 DIAGNOSIS — I130 Hypertensive heart and chronic kidney disease with heart failure and stage 1 through stage 4 chronic kidney disease, or unspecified chronic kidney disease: Secondary | ICD-10-CM | POA: Diagnosis not present

## 2022-01-26 DIAGNOSIS — Z8673 Personal history of transient ischemic attack (TIA), and cerebral infarction without residual deficits: Secondary | ICD-10-CM | POA: Diagnosis not present

## 2022-01-26 DIAGNOSIS — F0393 Unspecified dementia, unspecified severity, with mood disturbance: Secondary | ICD-10-CM | POA: Diagnosis not present

## 2022-01-26 DIAGNOSIS — E278 Other specified disorders of adrenal gland: Secondary | ICD-10-CM | POA: Diagnosis not present

## 2022-01-26 DIAGNOSIS — R569 Unspecified convulsions: Secondary | ICD-10-CM | POA: Diagnosis not present

## 2022-01-26 DIAGNOSIS — I2510 Atherosclerotic heart disease of native coronary artery without angina pectoris: Secondary | ICD-10-CM | POA: Diagnosis not present

## 2022-01-26 DIAGNOSIS — E1122 Type 2 diabetes mellitus with diabetic chronic kidney disease: Secondary | ICD-10-CM | POA: Diagnosis not present

## 2022-01-26 DIAGNOSIS — F32A Depression, unspecified: Secondary | ICD-10-CM | POA: Diagnosis not present

## 2022-01-26 DIAGNOSIS — Z9181 History of falling: Secondary | ICD-10-CM | POA: Diagnosis not present

## 2022-01-26 NOTE — Progress Notes (Signed)
[facility] Consult Note Telephone: [phone]  Fax: [phone]  PATIENT NAME: Kathryn Lewis DOB: 1945-05-17 MRN: [id number]  PRIMARY CARE PROVIDER:   [place], Betty G, MD Martinique, Betty G, MD [address] [place],  [place] [address]  REFERRING PROVIDER: Martinique, Betty G, MD Martinique, Betty G, MD [place],  [place] [address]  RESPONSIBLE PARTY:  Kathryn Lewis is the Legal guardian Contact Information     Name Relation Home Work Mobile   [place] Daughter [phone]  [phone]   Kathryn Lewis   [phone]       Visit is to build trust and highlight Palliative Medicine as specialized medical care for people living with serious illness, aimed at facilitating better quality of life through symptoms relief, assisting with advance care planning and complex medical decision making.   RECOMMENDATIONS/PLAN:   Advance Care Planning/Code Status: Patient is a Do Not Resuscitate.   Goals of Care: Goals of care include to maximize quality of life and symptom management.  Visit consisted of counseling and education dealing with the complex and emotionally intense issues of symptom management and palliative care in the setting of serious and potentially life-threatening illness.  Palliative care team will continue to support patient, patient's family, and medical team.  Symptom management/Plan:  COPD: No exacerbation since last three months. Continue Spiriva daily. Albuterol PRN.  Avoid triggers, encourage slow deep breathing. Type 2 DM: A1c  6.8 9/8//23. Continue insulin as ordered. Education reiterated on no concentrated sweets.  Repeat A1c 3-6 months. Negative for symptoms of hypoglycemia, polyuria, polydipsia, foot ulcers. Gait disturbance: recent fall. OT present during part of visit - ongoing for mobility, tranfers. Continue to use rolling walker for support. Fall precautions reiterated.   Follow up:  Palliative care will continue to follow for complex medical decision making, advance care planning, and clarification of goals. Return 6 weeks or prn. Encouraged to call provider sooner with any concerns.  CHIEF COMPLAINT: Palliative follow up  HISTORY OF PRESENT ILLNESS:  Kathryn Lewis a 77 y.o. female with multiple medical problems including COPD, type 2 diabetes mellitus, gait instability, hypertension, hyperlipidemia, CAD status post CVA.  History obtained from review of EMR, discussion with primary team, family and/or patient. Records reviewed and summarized above. All 10 point systems reviewed and are negative Review and summarization of Epic records shows history from other than patient. Independent interpretation of tests and reviewed as needed, available labs, patient records, imaging, studies and related documents from the EMR.  Palliative Care was asked to follow this patient to help address complex decision making in the context of advance care planning and goals of care clarification.  PERTINENT MEDICATIONS:  Outpatient Encounter Medications as of 01/26/2022  Medication Sig   albuterol (VENTOLIN HFA) 108 (90 Base) MCG/ACT inhaler Inhale 2 puffs into the lungs every 6 (six) hours as needed for wheezing or shortness of breath.   amLODipine (NORVASC) 10 MG tablet Take 1 tablet (10 mg total) by mouth daily.   ASPIRIN LOW DOSE 81 MG tablet TAKE ONE TABLET BY MOUTH EVERY DAY   budesonide-formoterol (SYMBICORT) 160-4.5 MCG/ACT inhaler Inhale 2 puffs into the lungs 2 (two) times daily.   clopidogrel (PLAVIX) 75 MG tablet TAKE ONE TABLET BY MOUTH EVERY DAY   ezetimibe (ZETIA) 10 MG tablet TAKE ONE TABLET BY MOUTH EVERY DAY   FEROSUL 325 (65 Fe) MG tablet TAKE ONE TABLET BY MOUTH EVERY OTHER DAY (EVERY TWO DAYS) WITH FOUR OUNCES  ORANGE JUICE   fluticasone (FLONASE) 50 MCG/ACT nasal spray Place 2 sprays into both nostrils daily.   furosemide (LASIX) 20 MG tablet Take 1 tablet (20 mg total) by  mouth daily as needed for edema.   GNP PAIN RELIEF EX-STRENGTH 500 MG tablet TAKE ONE TABLET BY MOUTH FOUR TIMES DAILY   GNP VITAMIN D MAXIMUM STRENGTH 50 MCG (2000 UT) TABS TAKE ONE CAPSULE BY MOUTH EVERY DAY   hydrALAZINE (APRESOLINE) 25 MG tablet Take 1 tablet (25 mg total) by mouth every 8 (eight) hours.   HYDROcodone-acetaminophen (NORCO/VICODIN) 5-325 MG tablet Take 1 tablet by mouth every 6 (six) hours as needed for moderate pain.   Insulin Aspart FlexPen (NOVOLOG) 100 UNIT/ML INJECT subcutaneously THREE TIMES DAILY PER sliding scales BLOOD SUGAR (150 NO INSULIN)+ 151-180 Give FOUR units. 181-220 Give SIX units, 221-260 Give EIGHT units, 261-300 Give 10 units 301-340 Give 12 units AND > 341 Give 14 units. Max dose of 36 units.   insulin glargine (LANTUS SOLOSTAR) 100 UNIT/ML Solostar Pen INJECT 20 units AT BEDTIME   ipratropium-albuterol (DUONEB) 0.5-2.5 (3) MG/3ML SOLN Take 3 mLs by nebulization 2 (two) times daily. (Patient not taking: Reported on 11/24/2021)   isosorbide mononitrate (IMDUR) 30 MG 24 hr tablet TAKE ONE TABLET BY MOUTH EVERY DAY   lamoTRIgine (LAMICTAL) 200 MG tablet TAKE 1.5 TABLETS BY MOUTH TWICE DAILY   losartan (COZAAR) 50 MG tablet TAKE ONE TABLET BY MOUTH EVERY DAY   metoprolol tartrate (LOPRESSOR) 50 MG tablet Take 1 tablet (50 mg total) by mouth 2 (two) times daily.   Omega-3 Fatty Acids (FISH OIL) 1000 MG CAPS TAKE TWO CAPSULES BY MOUTH EVERY DAY   phenytoin (DILANTIN) 100 MG ER capsule Take 3 capsules (300 mg total) by mouth daily. (Take with '30mg'$  tablet for total of '330mg'$  every night)   phenytoin (DILANTIN) 30 MG ER capsule Take 1 capsule (30 mg total) by mouth at bedtime (take with Dilantin '300mg'$  for total of '330mg'$  every night)   polyvinyl alcohol (ARTIFICIAL TEARS) 1.4 % ophthalmic solution Place 1 drop into both eyes in the morning and at bedtime.   rosuvastatin (CRESTOR) 40 MG tablet TAKE ONE TABLET BY MOUTH EVERY DAY   Spacer/Aero Chamber Mouthpiece MISC 1  Device by Does not apply route daily as needed.   SPIRIVA HANDIHALER 18 MCG inhalation capsule place ONE capsule into inhaler AND inhale EVERY DAY   spironolactone (ALDACTONE) 25 MG tablet Take 1 tablet (25 mg total) by mouth daily.   tiZANidine (ZANAFLEX) 4 MG tablet TAKE 1/2 TABLET BY MOUTH EVERY 12 HOURS AS NEEDED muscle SPASMS   No facility-administered encounter medications on file as of 01/26/2022.    HOSPICE ELIGIBILITY/DIAGNOSIS: TBD  PAST MEDICAL HISTORY:  Past Medical History:  Diagnosis Date   Allergy    Arthritis    Bipolar 1 disorder ([facility])    Blood transfusion without reported diagnosis    CAD (coronary artery disease) 04/17/2015   Carotid artery stenosis    50-69% bilateral ICA stenoses by velocity criteria but no visible plaque and ICA/CCA ratio 1.72 on right and 1.38 on left   Chronic airway obstruction, not elsewhere classified 03/02/2010   Overview:  Chronic Obstructive Pulmonary Disease   CKD (chronic kidney disease) stage 3, GFR 30-59 ml/min ([place] [facility]) 09/18/2021   Coronary artery disease    Depression    Diabetes mellitus without complication ([facility])    Echocardiogram    Echo 06/2018: Hyperdynamic systolic function, EF >95, mild concentric LVH, elevated LVEDP (E/E'  suggests impaired relaxation), mild AI, lipomatous interatrial septum   Hypertension    Left adrenal mass ([facility])    2.5 cm L adrenal mass on CT per note of Dr. Seleta Rhymes 05/20/2014   MI, old    PAD (peripheral artery disease) ([facility])    03/2014 - CT aortogram with lower extremity runoff (mild to moderate disease in common iliac arteries, complete occulusion of her bilateral external iliac arteries with heavily disease common femoral arteries. She also has disease to her SFAs bilaterally. Popliteal arteries and tibial vessel runoff appears to be adequate)   Seizures ([facility])    Stroke ([facility])      ALLERGIES:  Allergies  Allergen Reactions   Amoxicillin Other (See Comments)    colitis   Augmentin [Amoxicillin-Pot  Clavulanate] Other (See Comments)    colitis   Cephalexin Other (See Comments)    Colitis - ceftriaxone ok    I spent 45 minutes providing this consultation; time includes spent with patient/family, chart review and documentation. More than 50% of the time in this consultation was spent on care coordination.  Thank you for the opportunity to participate in the care of [place] Please call our office at [phone] if we can be of additional assistance.  Note: Portions of this note were generated with [employer]. Dictation errors may occur despite best attempts at proofreading.  Teodoro Spray, NP

## 2022-01-29 DIAGNOSIS — E7849 Other hyperlipidemia: Secondary | ICD-10-CM | POA: Diagnosis not present

## 2022-01-29 DIAGNOSIS — Z9181 History of falling: Secondary | ICD-10-CM | POA: Diagnosis not present

## 2022-01-29 DIAGNOSIS — Z794 Long term (current) use of insulin: Secondary | ICD-10-CM | POA: Diagnosis not present

## 2022-01-29 DIAGNOSIS — R569 Unspecified convulsions: Secondary | ICD-10-CM | POA: Diagnosis not present

## 2022-01-29 DIAGNOSIS — I252 Old myocardial infarction: Secondary | ICD-10-CM | POA: Diagnosis not present

## 2022-01-29 DIAGNOSIS — Z8673 Personal history of transient ischemic attack (TIA), and cerebral infarction without residual deficits: Secondary | ICD-10-CM | POA: Diagnosis not present

## 2022-01-29 DIAGNOSIS — F32A Depression, unspecified: Secondary | ICD-10-CM | POA: Diagnosis not present

## 2022-01-29 DIAGNOSIS — E1169 Type 2 diabetes mellitus with other specified complication: Secondary | ICD-10-CM | POA: Diagnosis not present

## 2022-01-29 DIAGNOSIS — I5032 Chronic diastolic (congestive) heart failure: Secondary | ICD-10-CM | POA: Diagnosis not present

## 2022-01-29 DIAGNOSIS — E278 Other specified disorders of adrenal gland: Secondary | ICD-10-CM | POA: Diagnosis not present

## 2022-01-29 DIAGNOSIS — F0393 Unspecified dementia, unspecified severity, with mood disturbance: Secondary | ICD-10-CM | POA: Diagnosis not present

## 2022-01-29 DIAGNOSIS — I2510 Atherosclerotic heart disease of native coronary artery without angina pectoris: Secondary | ICD-10-CM | POA: Diagnosis not present

## 2022-01-29 DIAGNOSIS — E1151 Type 2 diabetes mellitus with diabetic peripheral angiopathy without gangrene: Secondary | ICD-10-CM | POA: Diagnosis not present

## 2022-01-29 DIAGNOSIS — Z7951 Long term (current) use of inhaled steroids: Secondary | ICD-10-CM | POA: Diagnosis not present

## 2022-01-29 DIAGNOSIS — N1832 Chronic kidney disease, stage 3b: Secondary | ICD-10-CM | POA: Diagnosis not present

## 2022-01-29 DIAGNOSIS — I130 Hypertensive heart and chronic kidney disease with heart failure and stage 1 through stage 4 chronic kidney disease, or unspecified chronic kidney disease: Secondary | ICD-10-CM | POA: Diagnosis not present

## 2022-01-29 DIAGNOSIS — Z7982 Long term (current) use of aspirin: Secondary | ICD-10-CM | POA: Diagnosis not present

## 2022-01-29 DIAGNOSIS — Z7902 Long term (current) use of antithrombotics/antiplatelets: Secondary | ICD-10-CM | POA: Diagnosis not present

## 2022-01-29 DIAGNOSIS — J449 Chronic obstructive pulmonary disease, unspecified: Secondary | ICD-10-CM | POA: Diagnosis not present

## 2022-01-29 DIAGNOSIS — E1122 Type 2 diabetes mellitus with diabetic chronic kidney disease: Secondary | ICD-10-CM | POA: Diagnosis not present

## 2022-02-10 DIAGNOSIS — R569 Unspecified convulsions: Secondary | ICD-10-CM | POA: Diagnosis not present

## 2022-02-10 DIAGNOSIS — E1169 Type 2 diabetes mellitus with other specified complication: Secondary | ICD-10-CM | POA: Diagnosis not present

## 2022-02-10 DIAGNOSIS — J449 Chronic obstructive pulmonary disease, unspecified: Secondary | ICD-10-CM | POA: Diagnosis not present

## 2022-02-10 DIAGNOSIS — I2510 Atherosclerotic heart disease of native coronary artery without angina pectoris: Secondary | ICD-10-CM | POA: Diagnosis not present

## 2022-02-10 DIAGNOSIS — E1122 Type 2 diabetes mellitus with diabetic chronic kidney disease: Secondary | ICD-10-CM | POA: Diagnosis not present

## 2022-02-10 DIAGNOSIS — F0393 Unspecified dementia, unspecified severity, with mood disturbance: Secondary | ICD-10-CM | POA: Diagnosis not present

## 2022-02-10 DIAGNOSIS — E7849 Other hyperlipidemia: Secondary | ICD-10-CM | POA: Diagnosis not present

## 2022-02-10 DIAGNOSIS — I130 Hypertensive heart and chronic kidney disease with heart failure and stage 1 through stage 4 chronic kidney disease, or unspecified chronic kidney disease: Secondary | ICD-10-CM | POA: Diagnosis not present

## 2022-02-10 DIAGNOSIS — I5032 Chronic diastolic (congestive) heart failure: Secondary | ICD-10-CM | POA: Diagnosis not present

## 2022-02-10 DIAGNOSIS — E278 Other specified disorders of adrenal gland: Secondary | ICD-10-CM | POA: Diagnosis not present

## 2022-02-10 DIAGNOSIS — Z7951 Long term (current) use of inhaled steroids: Secondary | ICD-10-CM | POA: Diagnosis not present

## 2022-02-10 DIAGNOSIS — Z7982 Long term (current) use of aspirin: Secondary | ICD-10-CM | POA: Diagnosis not present

## 2022-02-10 DIAGNOSIS — Z794 Long term (current) use of insulin: Secondary | ICD-10-CM | POA: Diagnosis not present

## 2022-02-10 DIAGNOSIS — N1832 Chronic kidney disease, stage 3b: Secondary | ICD-10-CM | POA: Diagnosis not present

## 2022-02-10 DIAGNOSIS — I252 Old myocardial infarction: Secondary | ICD-10-CM | POA: Diagnosis not present

## 2022-02-10 DIAGNOSIS — E1151 Type 2 diabetes mellitus with diabetic peripheral angiopathy without gangrene: Secondary | ICD-10-CM | POA: Diagnosis not present

## 2022-02-10 DIAGNOSIS — Z8673 Personal history of transient ischemic attack (TIA), and cerebral infarction without residual deficits: Secondary | ICD-10-CM | POA: Diagnosis not present

## 2022-02-10 DIAGNOSIS — Z7902 Long term (current) use of antithrombotics/antiplatelets: Secondary | ICD-10-CM | POA: Diagnosis not present

## 2022-02-10 DIAGNOSIS — F32A Depression, unspecified: Secondary | ICD-10-CM | POA: Diagnosis not present

## 2022-02-10 DIAGNOSIS — Z9181 History of falling: Secondary | ICD-10-CM | POA: Diagnosis not present

## 2022-02-19 ENCOUNTER — Telehealth: Payer: Self-pay | Admitting: Family Medicine

## 2022-02-19 NOTE — Telephone Encounter (Signed)
If this is one time elevation, continue monitoring BP daily for 10 days and if persistently elevated, we will need to adjust antihypertensive treatment. Thanks, BJ

## 2022-02-19 NOTE — Telephone Encounter (Signed)
I spoke with Kathryn Lewis, she is aware of message below. Patient should be having BP monitored by facility. Kathryn Lewis will let us know if elevated again.

## 2022-02-19 NOTE — Telephone Encounter (Signed)
Durenda Hurt from [facility] call and stated pt BP this morning is 164/80.

## 2022-02-25 ENCOUNTER — Encounter (HOSPITAL_COMMUNITY): Payer: Self-pay | Admitting: Emergency Medicine

## 2022-02-25 ENCOUNTER — Emergency Department (HOSPITAL_COMMUNITY)
Admission: EM | Admit: 2022-02-25 | Discharge: 2022-02-25 | Disposition: A | Discharge status: Home / Self Care | Payer: PPO | Attending: Emergency Medicine | Admitting: Emergency Medicine

## 2022-02-25 ENCOUNTER — Emergency Department (HOSPITAL_COMMUNITY): Payer: PPO

## 2022-02-25 DIAGNOSIS — N289 Disorder of kidney and ureter, unspecified: Secondary | ICD-10-CM

## 2022-02-25 DIAGNOSIS — R1084 Generalized abdominal pain: Secondary | ICD-10-CM | POA: Diagnosis not present

## 2022-02-25 DIAGNOSIS — R079 Chest pain, unspecified: Secondary | ICD-10-CM | POA: Diagnosis not present

## 2022-02-25 DIAGNOSIS — E279 Disorder of adrenal gland, unspecified: Secondary | ICD-10-CM | POA: Insufficient documentation

## 2022-02-25 DIAGNOSIS — Z7982 Long term (current) use of aspirin: Secondary | ICD-10-CM | POA: Diagnosis not present

## 2022-02-25 DIAGNOSIS — Z79899 Other long term (current) drug therapy: Secondary | ICD-10-CM | POA: Insufficient documentation

## 2022-02-25 DIAGNOSIS — E119 Type 2 diabetes mellitus without complications: Secondary | ICD-10-CM | POA: Diagnosis not present

## 2022-02-25 DIAGNOSIS — I10 Essential (primary) hypertension: Secondary | ICD-10-CM | POA: Insufficient documentation

## 2022-02-25 DIAGNOSIS — I739 Peripheral vascular disease, unspecified: Secondary | ICD-10-CM | POA: Insufficient documentation

## 2022-02-25 DIAGNOSIS — I959 Hypotension, unspecified: Secondary | ICD-10-CM | POA: Diagnosis not present

## 2022-02-25 DIAGNOSIS — J449 Chronic obstructive pulmonary disease, unspecified: Secondary | ICD-10-CM | POA: Insufficient documentation

## 2022-02-25 DIAGNOSIS — K5730 Diverticulosis of large intestine without perforation or abscess without bleeding: Secondary | ICD-10-CM | POA: Diagnosis not present

## 2022-02-25 DIAGNOSIS — N2889 Other specified disorders of kidney and ureter: Secondary | ICD-10-CM | POA: Diagnosis not present

## 2022-02-25 DIAGNOSIS — Z7951 Long term (current) use of inhaled steroids: Secondary | ICD-10-CM | POA: Diagnosis not present

## 2022-02-25 DIAGNOSIS — I2510 Atherosclerotic heart disease of native coronary artery without angina pectoris: Secondary | ICD-10-CM | POA: Insufficient documentation

## 2022-02-25 DIAGNOSIS — Z794 Long term (current) use of insulin: Secondary | ICD-10-CM | POA: Diagnosis not present

## 2022-02-25 DIAGNOSIS — R109 Unspecified abdominal pain: Secondary | ICD-10-CM | POA: Diagnosis not present

## 2022-02-25 DIAGNOSIS — E278 Other specified disorders of adrenal gland: Secondary | ICD-10-CM

## 2022-02-25 LAB — LIPASE, BLOOD: Lipase: 47 U/L (ref 11–51)

## 2022-02-25 LAB — CBC WITH DIFFERENTIAL/PLATELET
Abs Immature Granulocytes: 0.1 10*3/uL — ABNORMAL HIGH (ref 0.00–0.07)
Basophils Absolute: 0 10*3/uL (ref 0.0–0.1)
Basophils Relative: 0 %
Eosinophils Absolute: 0.1 10*3/uL (ref 0.0–0.5)
Eosinophils Relative: 1 %
HCT: 35.3 % — ABNORMAL LOW (ref 36.0–46.0)
Hemoglobin: 11.4 g/dL — ABNORMAL LOW (ref 12.0–15.0)
Immature Granulocytes: 1 %
Lymphocytes Relative: 11 %
Lymphs Abs: 1.1 10*3/uL (ref 0.7–4.0)
MCH: 28.9 pg (ref 26.0–34.0)
MCHC: 32.3 g/dL (ref 30.0–36.0)
MCV: 89.6 fL (ref 80.0–100.0)
Monocytes Absolute: 0.7 10*3/uL (ref 0.1–1.0)
Monocytes Relative: 7 %
Neutro Abs: 7.5 10*3/uL (ref 1.7–7.7)
Neutrophils Relative %: 80 %
Platelets: 232 10*3/uL (ref 150–400)
RBC: 3.94 MIL/uL (ref 3.87–5.11)
RDW: 14.6 % (ref 11.5–15.5)
WBC: 9.5 10*3/uL (ref 4.0–10.5)
nRBC: 0 % (ref 0.0–0.2)

## 2022-02-25 LAB — COMPREHENSIVE METABOLIC PANEL
ALT: 14 U/L (ref 0–44)
AST: 23 U/L (ref 15–41)
Albumin: 2.9 g/dL — ABNORMAL LOW (ref 3.5–5.0)
Alkaline Phosphatase: 56 U/L (ref 38–126)
Anion gap: 7 (ref 5–15)
BUN: 28 mg/dL — ABNORMAL HIGH (ref 8–23)
CO2: 18 mmol/L — ABNORMAL LOW (ref 22–32)
Calcium: 7.5 mg/dL — ABNORMAL LOW (ref 8.9–10.3)
Chloride: 112 mmol/L — ABNORMAL HIGH (ref 98–111)
Creatinine, Ser: 1.47 mg/dL — ABNORMAL HIGH (ref 0.44–1.00)
GFR, Estimated: 37 mL/min — ABNORMAL LOW (ref >60)
Glucose, Bld: 98 mg/dL (ref 70–99)
Potassium: 3.5 mmol/L (ref 3.5–5.1)
Sodium: 137 mmol/L (ref 135–145)
Total Bilirubin: 0.4 mg/dL (ref 0.3–1.2)
Total Protein: 7.5 g/dL (ref 6.5–8.1)

## 2022-02-25 LAB — TROPONIN I (HIGH SENSITIVITY): Troponin I (High Sensitivity): 8 ng/L (ref <18)

## 2022-02-25 MED ORDER — LACTATED RINGERS IV BOLUS
1000.0000 mL | Freq: Once | INTRAVENOUS | Status: AC
  Administered 2022-02-25: 1000 mL via INTRAVENOUS

## 2022-02-25 MED ORDER — IOHEXOL 300 MG/ML  SOLN
75.0000 mL | Freq: Once | INTRAMUSCULAR | Status: AC | PRN
  Administered 2022-02-25: 75 mL via INTRAVENOUS

## 2022-02-25 NOTE — ED Notes (Signed)
Pt family member is going to take pt back to facility

## 2022-02-25 NOTE — ED Notes (Signed)
[facility] notified of need for transport. 

## 2022-02-25 NOTE — ED Provider Notes (Signed)
[facility] Provider Note   CSN: [id number] Arrival date & time: 02/25/22  8938     History  Chief Complaint  Patient presents with   Abdominal Pain    Kathryn Lewis is a 77 y.o. female.  77 year old female with a history of COPD, diabetes, gait instability now wheelchair-bound, hypertension, hyperlipidemia, CAD who presents to the emergency department with abdominal pain.  History limited due to patient's memory difficulties.  Says that recently she has been having abdominal pain over her entire abdomen.  Not currently having it.  Denies any nausea or vomiting.  Unsure of her last bowel movement and that she is passing flatus.  Denies any fevers.  No dysuria or frequency.  No numbness or weakness of her legs.  Called her facility who states that she was having right-sided chest pain on Tuesday that resolved.  Then did not get out of bed until yesterday and was complaining of right upper quadrant pain.  They denied any fevers or nausea or vomiting.  No diarrhea.  This morning was complaining of back pain and was leaning forward so they called 911.      Home Medications Prior to Admission medications   Medication Sig Start Date End Date Taking? Authorizing Provider  albuterol (VENTOLIN HFA) 108 (90 Base) MCG/ACT inhaler Inhale 2 puffs into the lungs every 6 (six) hours as needed for wheezing or shortness of breath. 11/28/20   Martinique, Betty G, MD  amLODipine (NORVASC) 10 MG tablet Take 1 tablet (10 mg total) by mouth daily. 12/08/21   Martinique, Betty G, MD  ASPIRIN LOW DOSE 81 MG tablet TAKE ONE TABLET BY MOUTH EVERY DAY 10/26/21   Martinique, Betty G, MD  budesonide-formoterol [facility]) 160-4.5 MCG/ACT inhaler Inhale 2 puffs into the lungs 2 (two) times daily. 12/29/21   Martinique, Betty G, MD  clopidogrel (PLAVIX) 75 MG tablet TAKE ONE TABLET BY MOUTH EVERY DAY 03/18/21   Martinique, Betty G, MD  ezetimibe (ZETIA) 10 MG tablet TAKE ONE TABLET BY MOUTH  EVERY DAY 09/25/21   Martinique, Betty G, MD  FEROSUL 325 (65 Fe) MG tablet TAKE ONE TABLET BY MOUTH EVERY OTHER DAY (EVERY TWO DAYS) WITH FOUR OUNCES ORANGE JUICE 10/26/21   Martinique, Betty G, MD  fluticasone [facility]) 50 MCG/ACT nasal spray Place 2 sprays into both nostrils daily. 04/22/17   Nafziger, Tommi Rumps, NP  furosemide (LASIX) 20 MG tablet Take 1 tablet (20 mg total) by mouth daily as needed for edema. 01/04/22   Martinique, Betty G, MD  GNP PAIN RELIEF EX-STRENGTH 500 MG tablet TAKE ONE TABLET BY MOUTH FOUR TIMES DAILY 08/19/21   Martinique, Betty G, MD  [facility] VITAMIN D MAXIMUM STRENGTH 50 MCG (2000 UT) TABS TAKE ONE CAPSULE BY MOUTH EVERY DAY 10/26/21   Martinique, Betty G, MD  hydrALAZINE (APRESOLINE) 25 MG tablet Take 1 tablet (25 mg total) by mouth every 8 (eight) hours. 12/25/21 01/24/22  Martinique, Betty G, MD  HYDROcodone-acetaminophen (NORCO/VICODIN) 5-325 MG tablet Take 1 tablet by mouth every 6 (six) hours as needed for moderate pain.    [provider]  Insulin Aspart FlexPen (NOVOLOG) 100 UNIT/ML INJECT subcutaneously THREE TIMES DAILY PER sliding scales BLOOD SUGAR (150 NO INSULIN)+ 151-180 Give FOUR units. 181-220 Give SIX units, 221-260 Give EIGHT units, 261-300 Give 10 units 301-340 Give 12 units AND > 341 Give 14 units. Max dose of 36 units. 10/20/21   Martinique, Betty G, MD  insulin glargine (LANTUS SOLOSTAR)  100 UNIT/ML Solostar Pen INJECT 20 units AT BEDTIME 10/20/21   Martinique, Betty G, MD  ipratropium-albuterol (DUONEB) 0.5-2.5 (3) MG/3ML SOLN Take 3 mLs by nebulization 2 (two) times daily. Patient not taking: Reported on 11/24/2021 10/09/21 11/08/21  Alma Friendly, MD  isosorbide mononitrate (IMDUR) 30 MG 24 hr tablet TAKE ONE TABLET BY MOUTH EVERY DAY 09/29/21   Martinique, Betty G, MD  lamoTRIgine (LAMICTAL) 200 MG tablet TAKE 1.5 TABLETS BY MOUTH TWICE DAILY 11/24/21   Cameron Sprang, MD  losartan (COZAAR) 50 MG tablet TAKE ONE TABLET BY MOUTH EVERY DAY 09/25/21   Martinique, Betty G, MD  metoprolol  tartrate (LOPRESSOR) 50 MG tablet Take 1 tablet (50 mg total) by mouth 2 (two) times daily. 12/22/21   Martinique, Betty G, MD  Omega-3 Fatty Acids ([place]) 1000 MG CAPS TAKE TWO CAPSULES BY MOUTH EVERY DAY 09/26/20   Martinique, Betty G, MD  phenytoin (DILANTIN) 100 MG ER capsule Take 3 capsules (300 mg total) by mouth daily. (Take with '30mg'$  tablet for total of '330mg'$  every night) 11/24/21   Cameron Sprang, MD  phenytoin (DILANTIN) 30 MG ER capsule Take 1 capsule (30 mg total) by mouth at bedtime (take with Dilantin '300mg'$  for total of '330mg'$  every night) 11/24/21   Cameron Sprang, MD  polyvinyl alcohol (ARTIFICIAL TEARS) 1.4 % ophthalmic solution Place 1 drop into both eyes in the morning and at bedtime. 04/22/21   Martinique, Betty G, MD  rosuvastatin (CRESTOR) 40 MG tablet TAKE ONE TABLET BY MOUTH EVERY DAY 08/04/21   Martinique, Betty G, MD  Spacer/Aero Chamber Mouthpiece MISC 1 Device by Does not apply route daily as needed. 12/24/16   Rigoberto Noel, MD  SPIRIVA HANDIHALER 18 MCG inhalation capsule place ONE capsule into inhaler AND inhale EVERY DAY 01/19/22   Martinique, Betty G, MD  spironolactone (ALDACTONE) 25 MG tablet Take 1 tablet (25 mg total) by mouth daily. 09/22/21   Martinique, Betty G, MD  tiZANidine (ZANAFLEX) 4 MG tablet TAKE 1/2 TABLET BY MOUTH EVERY 12 HOURS AS NEEDED muscle SPASMS 05/04/21   Martinique, Betty G, MD      Allergies    Amoxicillin, Augmentin [amoxicillin-pot clavulanate], and Cephalexin    Review of Systems   Review of Systems  Physical Exam Updated Vital Signs BP (!) 141/57 (BP Location: Right Arm)   Pulse 70   Temp 97.7 F (36.5 C)   Resp 14   LMP 03/17/2015   SpO2 100%  Physical Exam Vitals and nursing note reviewed.  Constitutional:      General: She is not in acute distress.    Appearance: She is well-developed.  HENT:     Head: Normocephalic and atraumatic.  Eyes:     Conjunctiva/sclera: Conjunctivae normal.  Cardiovascular:     Rate and Rhythm: Normal rate and  regular rhythm.     Heart sounds: No murmur heard. Pulmonary:     Effort: Pulmonary effort is normal. No respiratory distress.     Breath sounds: Normal breath sounds.  Abdominal:     General: There is no distension.     Palpations: Abdomen is soft. There is no mass.     Tenderness: There is no abdominal tenderness. There is no guarding.  Musculoskeletal:        General: No swelling.     Cervical back: Neck supple.     Comments: Motor: Muscle bulk and tone are normal. Strength is 5/5 in hip flexion, knee flexion and extension,  ankle dorsiflexion and plantar flexion bilaterally. Full strength of great toe dorsiflexion bilaterally.  Sensory: Intact sensation to light touch in L2 though S1 dermatomes bilaterally.   Skin:    General: Skin is warm and dry.     Capillary Refill: Capillary refill takes less than 2 seconds.  Neurological:     Mental Status: She is alert.  Psychiatric:        Mood and Affect: Mood normal.     ED Results / Procedures / Treatments   Labs (all labs ordered are listed, but only abnormal results are displayed) Labs Reviewed  COMPREHENSIVE METABOLIC PANEL - Abnormal; Notable for the following components:      Result Value   Chloride 112 (*)    CO2 18 (*)    BUN 28 (*)    Creatinine, Ser 1.47 (*)    Calcium 7.5 (*)    Albumin 2.9 (*)    GFR, Estimated 37 (*)    All other components within normal limits  CBC WITH DIFFERENTIAL/PLATELET - Abnormal; Notable for the following components:   Hemoglobin 11.4 (*)    HCT 35.3 (*)    Abs Immature Granulocytes 0.10 (*)    All other components within normal limits  LIPASE, BLOOD  URINALYSIS, ROUTINE W REFLEX MICROSCOPIC  TROPONIN I (HIGH SENSITIVITY)    EKG EKG Interpretation  Date/Time:  Thursday February 25 2022 08:31:05 EST Ventricular Rate:  61 PR Interval:  198 QRS Duration: 110 QT Interval:  389 QTC Calculation: 392 R Axis:   64 Text Interpretation: Sinus rhythm Minimal ST depression, inferior  leads When compared to 09/30/21 no significant change Confirmed by Margaretmary Eddy [phone]) on 02/25/2022 8:41:41 AM  Radiology CT ABDOMEN PELVIS W CONTRAST  Result Date: 02/25/2022 CLINICAL DATA:  Abdominal pain EXAM: CT ABDOMEN AND PELVIS WITH CONTRAST TECHNIQUE: Multidetector CT imaging of the abdomen and pelvis was performed using the standard protocol following bolus administration of intravenous contrast. RADIATION DOSE REDUCTION: This exam was performed according to the departmental dose-optimization program which includes automated exposure control, adjustment of the mA and/or kV according to patient size and/or use of iterative reconstruction technique. CONTRAST:  23m OMNIPAQUE IOHEXOL 300 MG/ML  SOLN COMPARISON:  None Available. FINDINGS: Lower chest: There is mild bibasilar subsegmental atelectasis. Coronary artery calcifications are noted. The imaged heart is otherwise unremarkable. Hepatobiliary: The liver and gallbladder are unremarkable. There is no biliary ductal dilatation. Pancreas: Unremarkable. Spleen: Unremarkable. Adrenals/Urinary Tract: There is a 2.1 cm by 1.0 cm left adrenal nodule. The right adrenal is unremarkable. There is a heterogeneously enhancing cortical lesion in the left interpolar region measuring up to 1.9 cm (2-39, 5-54). There is cortical thinning/scarring in the left upper and lower poles. Calcifications in the kidneys are favored to reflect vascular calcifications as opposed to stones. There are no focal lesions on the right. There are no stones along the course of either ureter. There is no hydronephrosis or hydroureter. The bladder is unremarkable. Stomach/Bowel: The stomach is unremarkable. There is no evidence of bowel obstruction. There is no abnormal bowel wall thickening or inflammatory change. There is colonic diverticulosis without evidence of acute diverticulitis. The appendix is not definitively seen. Vascular/Lymphatic: There is extensive plaque throughout the  nonaneurysmal abdominal aorta and at the origins of the branch vessels. The bilateral external iliac arteries appear occluded with reconstitution of flow distally. There is severe stenosis of the origin of the left renal artery. There is soft plaque in the proximal SMA resulting in moderate stenosis. The  distal SMA appears patent. The IMA appears patent. There is moderate stenosis of the origin of the celiac artery. The main portal and splenic veins are patent. There is no abdominal or pelvic lymphadenopathy. Reproductive: There are prominent uterine and gonadal veins. There is no adnexal mass. Other: There is no ascites or free air. Musculoskeletal: There is no acute osseous abnormality or suspicious osseous lesion. IMPRESSION: 1. No acute findings in the abdomen or pelvis. 2. Heterogeneously enhancing cortical lesion in the left kidney is suspicious for neoplasm. Recommend further evaluation with nonemergent outpatient renal MRI with and without contrast. 3. 2.1 cm left adrenal nodule is indeterminate and can also be assessed at the time of follow-up MRI. 4. Extensive atherosclerotic disease with occlusion of the bilateral external iliac arteries with reconstitution of flow distally, severe stenosis of the origin of the left renal artery, and moderate stenosis of the proximal SMA and celiac artery. 5. Prominent uterine and gonadal veins which can be seen in the setting of pelvic congestion syndrome. 6. Colonic diverticulosis without evidence of acute diverticulitis. Electronically Signed   By: Valetta Mole M.D.   On: 02/25/2022 11:35   DG Chest 2 View  Result Date: 02/25/2022 CLINICAL DATA:  Right-sided chest pain EXAM: CHEST - 2 VIEW COMPARISON:  10/04/2021 FINDINGS: Lateral view degraded by patient arm position. Apical lordotic positioning on the AP frontal radiograph. Cervical spine fixation. Midline trachea. Mild cardiomegaly. No pleural effusion or pneumothorax. Low lung volumes with resultant pulmonary  interstitial prominence. No lobar consolidation. No congestive failure. IMPRESSION: Cardiomegaly and mildly low lung volumes.  No acute findings. Electronically Signed   By: Abigail Miyamoto M.D.   On: 02/25/2022 09:05    Procedures Procedures   Medications Ordered in ED Medications  lactated ringers bolus 1,000 mL (0 mLs Intravenous Stopped 02/25/22 1116)  iohexol (OMNIPAQUE) 300 MG/ML solution 75 mL (75 mLs Intravenous Contrast Given 02/25/22 1056)    ED Course/ Medical Decision Making/ A&P Clinical Course as of 02/25/22 1644  Thu Feb 25, 2022  0938 Creatinine(!): 1.47 Baseline of 1.1  [RP]    Clinical Course User Index [RP] Fransico Meadow, MD                             Medical Decision Making Amount and/or Complexity of Data Reviewed Labs: ordered. Decision-making details documented in ED Course. Radiology: ordered.  Risk Prescription drug management.   Kathryn Lewis is a 77 y.o. female with comorbidities that complicate the patient evaluation including COPD, diabetes, gait instability now wheelchair-bound, hypertension, and hyperlipidemia who presents to the emergency department with abdominal pain and also reporting chest pain  Initial Ddx:  MI, PE, pneumonia, bowel obstruction, intra-abdominal abscess  MDM:  Unclear what is causing the patient's symptoms that she is a poor historian.  With her chest pain resolving feel that an ischemic event is possible but since she is not having active chest pain we will send off troponin but if EKG and troponin are unremarkable do not feel that repeat is required.  No infectious symptoms that would suggest pneumonia but will obtain chest x-ray to evaluate for infiltrate or other abnormality.  Considered pulmonary embolism but she is not tachycardic and does not have any significant lower extremity swelling on exam that would indicate DVT so feel that this is highly unlikely.  With her abdominal pain will obtain CT of the abdomen pelvis  to rule out intra-abdominal abscess or bowel  obstruction with her age but feel this is less likely.  Plan:  Labs Troponin CT abdomen pelvis Chest x-ray  ED Summary/Re-evaluation:  Patient underwent the above workup which shows possible AKI.  Did give IV fluids in the emergency department in case of AKI.  Also obtain CT scan that did not show hydronephrosis or obstruction.  She has a baseline creatinine that is quite variable but has been similar values in the past.  CT did not show any acute findings but did show renal lesion and adrenal lesion the patient and her daughter were notified of.  Did also show peripheral arterial disease so feel that she should follow-up with vascular surgery.  Troponin and EKG unremarkable.  Will have the patient follow-up with her primary care doctor regarding her symptoms and repeat blood work to ensure that her renal function is improving.  This patient presents to the ED for concern of complaints listed in HPI, this involves an extensive number of treatment options, and is a complaint that carries with it a high risk of complications and morbidity. Disposition including potential need for admission considered.   Dispo: DC to Facility  Additional history obtained from daughter Records reviewed Outpatient Clinic Notes The following labs were independently interpreted: Chemistry and CBC and show  possible AKI I independently reviewed the following imaging with scope of interpretation limited to determining acute life threatening conditions related to emergency care: Chest x-ray and CT Abdomen/Pelvis and agree with the radiologist interpretation with the following exceptions: None I personally reviewed and interpreted cardiac monitoring: normal sinus rhythm  I personally reviewed and interpreted the pt's EKG: see above for interpretation  I have reviewed the patients home medications and made adjustments as needed Social Determinants of health:  SNF  resident  Final Clinical Impression(s) / ED Diagnoses Final diagnoses:  Generalized abdominal pain  Lesion of left native kidney  Adrenal nodule ([place])  Peripheral artery disease ([place])    Rx / DC Orders ED Discharge Orders     None         Fransico Meadow, MD 02/25/22 1644

## 2022-02-25 NOTE — ED Triage Notes (Signed)
Pt here  from  [place] with c/o abd pain ,no n/v or  urinary symptoms

## 2022-02-25 NOTE — Discharge Instructions (Addendum)
You were seen for your abdominal pain in the emergency department.   Your CT scan was reassuring but it did show a spot on your kidney and adrenal gland that are concerning and will need an MRI as an outpatient.  It also showed that you have narrowing of your arteries from peripheral artery disease.  Please follow-up with vascular surgery about this.   Follow-up with your primary doctor in 2-3 days regarding your visit and other doctors listed above. Please talk to your primary doctor about the kidney spot and MRI.   Return immediately to the emergency department if you experience any of the following: Worsening pain, vomiting, fever, or any other concerning symptoms.    Thank you for visiting our Emergency Department. It was a pleasure taking care of you today.

## 2022-02-26 ENCOUNTER — Other Ambulatory Visit: Payer: Self-pay | Admitting: Family Medicine

## 2022-02-26 ENCOUNTER — Ambulatory Visit (INDEPENDENT_AMBULATORY_CARE_PROVIDER_SITE_OTHER): Payer: PPO | Admitting: Family Medicine

## 2022-02-26 VITALS — BP 160/70 | HR 70 | Temp 97.8°F | Ht 63.0 in | Wt 132.8 lb

## 2022-02-26 DIAGNOSIS — I2510 Atherosclerotic heart disease of native coronary artery without angina pectoris: Secondary | ICD-10-CM | POA: Diagnosis not present

## 2022-02-26 DIAGNOSIS — J449 Chronic obstructive pulmonary disease, unspecified: Secondary | ICD-10-CM | POA: Diagnosis not present

## 2022-02-26 DIAGNOSIS — N179 Acute kidney failure, unspecified: Secondary | ICD-10-CM | POA: Diagnosis not present

## 2022-02-26 DIAGNOSIS — F32A Depression, unspecified: Secondary | ICD-10-CM | POA: Diagnosis not present

## 2022-02-26 DIAGNOSIS — I252 Old myocardial infarction: Secondary | ICD-10-CM | POA: Diagnosis not present

## 2022-02-26 DIAGNOSIS — N289 Disorder of kidney and ureter, unspecified: Secondary | ICD-10-CM

## 2022-02-26 DIAGNOSIS — R3989 Other symptoms and signs involving the genitourinary system: Secondary | ICD-10-CM

## 2022-02-26 DIAGNOSIS — I739 Peripheral vascular disease, unspecified: Secondary | ICD-10-CM

## 2022-02-26 DIAGNOSIS — Z7982 Long term (current) use of aspirin: Secondary | ICD-10-CM | POA: Diagnosis not present

## 2022-02-26 DIAGNOSIS — Z7902 Long term (current) use of antithrombotics/antiplatelets: Secondary | ICD-10-CM | POA: Diagnosis not present

## 2022-02-26 DIAGNOSIS — I701 Atherosclerosis of renal artery: Secondary | ICD-10-CM | POA: Diagnosis not present

## 2022-02-26 DIAGNOSIS — E1169 Type 2 diabetes mellitus with other specified complication: Secondary | ICD-10-CM | POA: Diagnosis not present

## 2022-02-26 DIAGNOSIS — E1151 Type 2 diabetes mellitus with diabetic peripheral angiopathy without gangrene: Secondary | ICD-10-CM | POA: Diagnosis not present

## 2022-02-26 DIAGNOSIS — E1122 Type 2 diabetes mellitus with diabetic chronic kidney disease: Secondary | ICD-10-CM | POA: Diagnosis not present

## 2022-02-26 DIAGNOSIS — I130 Hypertensive heart and chronic kidney disease with heart failure and stage 1 through stage 4 chronic kidney disease, or unspecified chronic kidney disease: Secondary | ICD-10-CM | POA: Diagnosis not present

## 2022-02-26 DIAGNOSIS — Z7951 Long term (current) use of inhaled steroids: Secondary | ICD-10-CM | POA: Diagnosis not present

## 2022-02-26 DIAGNOSIS — I5032 Chronic diastolic (congestive) heart failure: Secondary | ICD-10-CM | POA: Diagnosis not present

## 2022-02-26 DIAGNOSIS — F0390 Unspecified dementia without behavioral disturbance: Secondary | ICD-10-CM | POA: Diagnosis not present

## 2022-02-26 DIAGNOSIS — E278 Other specified disorders of adrenal gland: Secondary | ICD-10-CM | POA: Diagnosis not present

## 2022-02-26 DIAGNOSIS — F0393 Unspecified dementia, unspecified severity, with mood disturbance: Secondary | ICD-10-CM | POA: Diagnosis not present

## 2022-02-26 DIAGNOSIS — Z9181 History of falling: Secondary | ICD-10-CM | POA: Diagnosis not present

## 2022-02-26 DIAGNOSIS — Z8673 Personal history of transient ischemic attack (TIA), and cerebral infarction without residual deficits: Secondary | ICD-10-CM | POA: Diagnosis not present

## 2022-02-26 DIAGNOSIS — Z794 Long term (current) use of insulin: Secondary | ICD-10-CM | POA: Diagnosis not present

## 2022-02-26 DIAGNOSIS — E7849 Other hyperlipidemia: Secondary | ICD-10-CM | POA: Diagnosis not present

## 2022-02-26 DIAGNOSIS — N1832 Chronic kidney disease, stage 3b: Secondary | ICD-10-CM | POA: Diagnosis not present

## 2022-02-26 DIAGNOSIS — R569 Unspecified convulsions: Secondary | ICD-10-CM | POA: Diagnosis not present

## 2022-02-26 LAB — POCT URINALYSIS DIPSTICK
Bilirubin, UA: NEGATIVE
Blood, UA: NEGATIVE
Glucose, UA: NEGATIVE
Ketones, UA: NEGATIVE
Leukocytes, UA: NEGATIVE
Nitrite, UA: NEGATIVE
Protein, UA: POSITIVE — AB
Spec Grav, UA: 1.015 (ref 1.010–1.025)
Urobilinogen, UA: 0.2 E.U./dL
pH, UA: 6 (ref 5.0–8.0)

## 2022-02-26 NOTE — Progress Notes (Signed)
Established Patient Office Visit   Subjective  Patient ID: Kathryn Lewis, female    DOB: Jun 08, 1945  Age: 77 y.o. MRN: [id number]  Chief Complaint  Patient presents with   Altered Mental Status    Pt is accompanied with daughter. Daughter reports they had noticed pt was confused two days ago. Along with sx of abdominal pain, chest pain. Pt was seen at ED yesterday. CAT scan, EKG was taken. Result was fine.    Patient accompanied by her daughter  Patient is a 77 year old female with pmh sig for dementia, HTN, CAD, COPD, DM II, HLD, OSA, seizure d/o s/p CVA, neuropathyPAD followed by Dr. Martinique, seen for acute concern.  Patient seen yesterday, 02/25/2022, in ED for abdominal pain, chest pain, and increased confusion.  CT abdomen and pelvis with significant PAD, a lesion in left kidney and in left adrenal gland.  Follow-up MRI recommended.  Patient's daughter made appointment today after realizing a UA was not done in ED.  Patient daughter states in the past patient become confused with UTIs.  Patient denies symptoms.                                                                  Review of Systems  Unable to perform ROS: Dementia   Negative unless stated above    Objective:     BP (!) 160/70 (BP Location: Right Arm, Patient Position: Sitting, Cuff Size: Large)   Pulse 70   Temp 97.8 F (36.6 C) (Oral)   Ht '5\' 3"'$  (1.6 m)   Wt 132 lb 12.8 oz (60.2 kg)   LMP 03/17/2015   SpO2 100%   BMI 23.52 kg/m    Physical Exam Constitutional:      Appearance: Normal appearance.  HENT:     Head: Normocephalic and atraumatic.     Nose: Nose normal.     Mouth/Throat:     Mouth: Mucous membranes are moist.  Eyes:     Extraocular Movements: Extraocular movements intact.     Conjunctiva/sclera: Conjunctivae normal.     Pupils: Pupils are equal, round, and reactive to light.  Cardiovascular:     Rate and Rhythm: Normal rate.  Pulmonary:     Effort: Pulmonary effort is normal.   Skin:    General: Skin is warm and dry.  Neurological:     Mental Status: She is alert.     Comments: Sitting in transport wheelchair.  Patient alert and oriented x 2 to person and place.      Results for orders placed or performed in visit on 02/26/22  POC Urinalysis Dipstick  Result Value Ref Range   Color, UA yellow    Clarity, UA cloudy    Glucose, UA Negative Negative   Bilirubin, UA negative    Ketones, UA negative    Spec Grav, UA 1.015 1.010 - 1.025   Blood, UA negative    pH, UA 6.0 5.0 - 8.0   Protein, UA Positive (A) Negative   Urobilinogen, UA 0.2 0.2 or 1.0 E.U./dL   Nitrite, UA negative    Leukocytes, UA Negative Negative   Appearance     Odor        Assessment & Plan:  Suspected UTI -UA negative -  POCT urinalysis dipstick  PAD (peripheral artery disease) ([place]) -noted on CT abd pelvis 02/25/22 in ED with severe stenosis of L renal artery, moderate stenosis of proximal SMA and celiac artery.  Extensive atherosclerotic dz. With occlusion of the b/l external iliac arteries with reconstitution of flow distally.  Prominent uterine and gonadal veins suggesting pelvic congestion syndrome. -medical management advised. -would like to avoid surgery/not a surgical candidate. -Continue aspirin, Plavix, Zetia  AKI (acute kidney injury) ([place]) -Creatinine 1.47, GFR 37 on 02/25/2022 in ED. -Baseline creatinine 0.97-1.14 in 2023 -hydration encouraged -continue to monitor  Renal artery stenosis ([place]) -noted on CT abd and pelvis 02/25/22. Severe stenosis of the origin of L renal artery. -advised can contribute to HTN -continue current bp meds and lifestyle modifications  Kidney lesion, native, left -CT abdomen and pelvis with contrast 02/25/2022 with a heterogeneously enhancing cortical lesion in the left kidney suspicious for neoplasm.  Further evaluation with outpatient renal MRI with and without contrast recommended.  A 2.1 cm left adrenal nodule indeterminate also  noted and advised to be followed up at time of MRI. -Results discussed with patient and her daughter's.  Patient expressed understanding and declines further evaluation at this time.  Daughter in agreement with patient's decision.  Dementia without behavioral disturbance (H[facility]) -Stable  Hypocalcemia -Calcium 7.5 on 02/25/2022 in ED.  Corrected calcium remains low at 8.2 and his albumin is also low -Will have patient take several Tums twice a day for the next 3 days.  Then increase p.o. intake of dairy and vitamin D.  Patient's daughter called regarding this information. -Consider supplemental shakes  F/u with pcp prn   Billie Ruddy, MD

## 2022-03-02 ENCOUNTER — Encounter: Payer: HMO | Admitting: Family Medicine

## 2022-03-02 NOTE — Progress Notes (Signed)
Nurse assistant call to check in but patient requested to reschedule.

## 2022-03-03 ENCOUNTER — Other Ambulatory Visit: Payer: Self-pay | Admitting: Family Medicine

## 2022-03-04 ENCOUNTER — Telehealth: Payer: Self-pay

## 2022-03-04 NOTE — Telephone Encounter (Signed)
        Patient  visited [place] on 2/1   Telephone encounter attempt :  1st  A HIPAA compliant voice message was left requesting a return call.  Instructed patient to call back .    [facility] [phone] [address], [place], [place] [address] Phone: [phone] Email: [email or web address]

## 2022-03-05 ENCOUNTER — Telehealth: Payer: Self-pay

## 2022-03-05 NOTE — Telephone Encounter (Signed)
        Patient  visited [place] on 2/1   Telephone encounter attempt :  2nd  A HIPAA compliant voice message was left requesting a return call.  Instructed patient to call back    [facility] [phone] [address], [place], [place] [address] Phone: [phone] Email: [email or web address]

## 2022-03-22 ENCOUNTER — Other Ambulatory Visit: Payer: Self-pay | Admitting: Family Medicine

## 2022-03-23 ENCOUNTER — Encounter: Payer: Self-pay | Admitting: Family Medicine

## 2022-03-25 ENCOUNTER — Non-Acute Institutional Stay: Payer: PPO | Admitting: Hospice

## 2022-03-25 DIAGNOSIS — E1169 Type 2 diabetes mellitus with other specified complication: Secondary | ICD-10-CM | POA: Diagnosis not present

## 2022-03-25 DIAGNOSIS — J449 Chronic obstructive pulmonary disease, unspecified: Secondary | ICD-10-CM | POA: Diagnosis not present

## 2022-03-25 DIAGNOSIS — R269 Unspecified abnormalities of gait and mobility: Secondary | ICD-10-CM | POA: Diagnosis not present

## 2022-03-25 DIAGNOSIS — Z515 Encounter for palliative care: Secondary | ICD-10-CM

## 2022-03-25 DIAGNOSIS — E782 Mixed hyperlipidemia: Secondary | ICD-10-CM | POA: Diagnosis not present

## 2022-03-25 NOTE — Progress Notes (Signed)
[facility] Consult Note Telephone: [phone]  Fax: [phone]  PATIENT NAME: Kathryn Lewis DOB: Jul 28, 1945 MRN: [id number]  PRIMARY CARE PROVIDER:   Martinique, Betty G, MD Martinique, Betty G, MD [address] [place],  [place] [address]  REFERRING PROVIDER: Martinique, Betty G, MD Martinique, Betty G, MD [place],   [address]  RESPONSIBLE PARTY:  [name] is the Legal guardian Contact Information     Name Relation Home Work Mobile   [place] Daughter [phone]  [phone]   Nancy, Gorbet   [phone]       Visit is to build trust and highlight Palliative Medicine as specialized medical care for people living with serious illness, aimed at facilitating better quality of life through symptoms relief, assisting with advance care planning and complex medical decision making.   RECOMMENDATIONS/PLAN:   Advance Care Planning/Code Status: Patient is a Do Not Resuscitate.   Goals of Care: Goals of care include to maximize quality of life and symptom management.  Visit consisted of counseling and education dealing with the complex and emotionally intense issues of symptom management and palliative care in the setting of serious and potentially life-threatening illness.  Palliative care team will continue to support patient, patient's family, and medical team.  Symptom management/Plan:  COPD: Stable, not on oxygen. Continue Spiriva daily. Albuterol PRN.  Avoid triggers, encourage slow deep breathing. Type 2 DM: Controlled. A1c  6.8 9/8//23. Continue insulin as ordered. Education reiterated on no concentrated sweets.  Repeat A1c 3-6 months.  Gait disturbance: Completed occupational therapy. Continue to use rolling walker for support. Fall precautions reiterated.   Follow up: Palliative care will continue to follow for complex medical decision making, advance care planning, and clarification of goals.  Return 6 weeks or prn. Encouraged to call provider sooner with any concerns.  CHIEF COMPLAINT: Palliative follow up  HISTORY OF PRESENT ILLNESS:  Kathryn Lewis a 77 y.o. female with multiple medical problems including COPD, type 2 diabetes mellitus, gait instability, hypertension, hyperlipidemia, CAD status post CVA. Patient endorsed feeling well and in good spirit, denies abdominal pain, chest pain or any discomfort.  History obtained from review of EMR, discussion with primary team, family and/or patient. Records reviewed and summarized above. All 10 point systems reviewed and are negative Review and summarization of Epic records shows history from other than patient. Independent interpretation of tests and reviewed as needed, available labs, patient records, imaging, studies and related documents from the EMR.  PHYSICAL EXAM: GEN: in no acute distress  Cardiac: S1 S2, no LE edema feet/ankles  Respiratory:  no adventitious lung sound, slightly diminished in the bases GI: soft, nontender, nondistended, + BS   MS: self propels wheelchair, ambulatory with rolling walker Neuro: generalized weakness, A and O x 2, forgetful Psych: non-anxious affect  PERTINENT MEDICATIONS:  Outpatient Encounter Medications as of 03/25/2022  Medication Sig   albuterol (VENTOLIN HFA) 108 (90 Base) MCG/ACT inhaler Inhale 2 puffs into the lungs every 6 (six) hours as needed for wheezing or shortness of breath.   amLODipine (NORVASC) 10 MG tablet TAKE ONE TABLET BY MOUTH EVERY DAY   ASPIRIN LOW DOSE 81 MG tablet TAKE ONE TABLET BY MOUTH EVERY DAY   budesonide-formoterol (SYMBICORT) 160-4.5 MCG/ACT inhaler Inhale 2 puffs into the lungs 2 (two) times daily.   clopidogrel (PLAVIX) 75 MG tablet TAKE ONE TABLET BY MOUTH EVERY DAY   ezetimibe (ZETIA) 10 MG tablet TAKE ONE TABLET BY MOUTH  EVERY DAY   FEROSUL 325 (65 Fe) MG tablet TAKE ONE TABLET BY MOUTH EVERY OTHER DAY (EVERY TWO DAYS) WITH FOUR OUNCES ORANGE JUICE    fluticasone (FLONASE) 50 MCG/ACT nasal spray Place 2 sprays into both nostrils daily.   furosemide (LASIX) 20 MG tablet Take 1 tablet (20 mg total) by mouth daily as needed for edema.   GNP PAIN RELIEF EX-STRENGTH 500 MG tablet TAKE ONE TABLET BY MOUTH FOUR TIMES DAILY   GNP VITAMIN D MAXIMUM STRENGTH 50 MCG (2000 UT) TABS TAKE ONE CAPSULE BY MOUTH EVERY DAY   hydrALAZINE (APRESOLINE) 25 MG tablet TAKE ONE TABLET BY MOUTH EVERY 8 HOURS   HYDROcodone-acetaminophen (NORCO/VICODIN) 5-325 MG tablet Take 1 tablet by mouth every 6 (six) hours as needed for moderate pain.   Insulin Aspart FlexPen (NOVOLOG) 100 UNIT/ML INJECT subcutaneously THREE TIMES DAILY PER sliding scales BLOOD SUGAR (150 NO INSULIN)+ 151-180 Give FOUR units. 181-220 Give SIX units, 221-260 Give EIGHT units, 261-300 Give 10 units 301-340 Give 12 units AND > 341 Give 14 units. Max dose of 36 units.   insulin glargine (LANTUS SOLOSTAR) 100 UNIT/ML Solostar Pen INJECT 20 units AT BEDTIME   ipratropium-albuterol (DUONEB) 0.5-2.5 (3) MG/3ML SOLN Take 3 mLs by nebulization 2 (two) times daily. (Patient not taking: Reported on 11/24/2021)   isosorbide mononitrate (IMDUR) 30 MG 24 hr tablet TAKE ONE TABLET BY MOUTH EVERY DAY   lamoTRIgine (LAMICTAL) 200 MG tablet TAKE 1.5 TABLETS BY MOUTH TWICE DAILY   losartan (COZAAR) 50 MG tablet TAKE ONE TABLET BY MOUTH EVERY DAY   metoprolol tartrate (LOPRESSOR) 50 MG tablet Take 1 tablet (50 mg total) by mouth 2 (two) times daily.   Omega-3 Fatty Acids (FISH OIL) 1000 MG CAPS TAKE TWO CAPSULES BY MOUTH EVERY DAY   phenytoin (DILANTIN) 100 MG ER capsule Take 3 capsules (300 mg total) by mouth daily. (Take with '30mg'$  tablet for total of '330mg'$  every night)   phenytoin (DILANTIN) 30 MG ER capsule Take 1 capsule (30 mg total) by mouth at bedtime (take with Dilantin '300mg'$  for total of '330mg'$  every night)   polyvinyl alcohol (ARTIFICIAL TEARS) 1.4 % ophthalmic solution Place 1 drop into both eyes in the morning  and at bedtime.   rosuvastatin (CRESTOR) 40 MG tablet TAKE ONE TABLET BY MOUTH EVERY DAY   Spacer/Aero Chamber Mouthpiece MISC 1 Device by Does not apply route daily as needed.   SPIRIVA HANDIHALER 18 MCG inhalation capsule place ONE capsule into inhaler AND inhale EVERY DAY   spironolactone (ALDACTONE) 25 MG tablet Take 1 tablet (25 mg total) by mouth daily.   tiZANidine (ZANAFLEX) 4 MG tablet TAKE 1/2 TABLET BY MOUTH EVERY 12 HOURS AS NEEDED muscle SPASMS   No facility-administered encounter medications on file as of 03/25/2022.    HOSPICE ELIGIBILITY/DIAGNOSIS: TBD  PAST MEDICAL HISTORY:  Past Medical History:  Diagnosis Date   Allergy    Arthritis    Bipolar 1 disorder ([facility] [place])    Blood transfusion without reported diagnosis    CAD (coronary artery disease) 04/17/2015   Carotid artery stenosis    50-69% bilateral ICA stenoses by velocity criteria but no visible plaque and ICA/CCA ratio 1.72 on right and 1.38 on left   Chronic airway obstruction, not elsewhere classified 03/02/2010   Overview:  Chronic Obstructive Pulmonary Disease   CKD (chronic kidney disease) stage 3, GFR 30-59 ml/min ([facility]) 09/18/2021   Coronary artery disease    Depression    Diabetes mellitus without complication ([facility])  Echocardiogram    Echo 06/2018: Hyperdynamic systolic function, EF 99991111, mild concentric LVH, elevated LVEDP (E/E' suggests impaired relaxation), mild AI, lipomatous interatrial septum   Hypertension    Left adrenal mass ([facility])    2.5 cm L adrenal mass on CT per note of Dr. Seleta Rhymes 05/20/2014   MI, old    PAD (peripheral artery disease) ([facility])    03/2014 - CT aortogram with lower extremity runoff (mild to moderate disease in common iliac arteries, complete occulusion of her bilateral external iliac arteries with heavily disease common femoral arteries. She also has disease to her SFAs bilaterally. Popliteal arteries and tibial vessel runoff appears to be adequate)   Seizures (H[facility])    Stroke ([facility])       ALLERGIES:  Allergies  Allergen Reactions   Amoxicillin Other (See Comments)    colitis   Augmentin [Amoxicillin-Pot Clavulanate] Other (See Comments)    colitis   Cephalexin Other (See Comments)    Colitis - ceftriaxone ok    I spent 60 minutes providing this consultation; time includes spent with patient/family, chart review and documentation. More than 50% of the time in this consultation was spent on care coordination.  Thank you for the opportunity to participate in the care of [place] Please call our office at [phone] if we can be of additional assistance.  Note: Portions of this note were generated with [employer]. Dictation errors may occur despite best attempts at proofreading.  Teodoro Spray, NP

## 2022-03-30 ENCOUNTER — Other Ambulatory Visit: Payer: Self-pay | Admitting: Family Medicine

## 2022-03-30 DIAGNOSIS — I119 Hypertensive heart disease without heart failure: Secondary | ICD-10-CM

## 2022-04-06 ENCOUNTER — Telehealth: Payer: Self-pay | Admitting: Family Medicine

## 2022-04-06 MED ORDER — PEN NEEDLES 32G X 4 MM MISC: 3 refills | Status: DC

## 2022-04-06 NOTE — Telephone Encounter (Signed)
Rx sent 

## 2022-04-06 NOTE — Telephone Encounter (Signed)
Pt daughter [name] is calling and her mother need new rx for insulin pen needles for insulin glargine (LANTUS SOLOSTAR) 100 UNIT/ML Solostar Pen and pen needles for Insulin Aspart FlexPen (NOVOLOG) 100 UNIT/ML  [place], [place] Phone: [phone]  Fax: [phone]

## 2022-04-10 ENCOUNTER — Other Ambulatory Visit: Payer: Self-pay | Admitting: Family Medicine

## 2022-04-14 ENCOUNTER — Other Ambulatory Visit: Payer: Self-pay | Admitting: *Deleted

## 2022-04-14 DIAGNOSIS — I739 Peripheral vascular disease, unspecified: Secondary | ICD-10-CM

## 2022-04-14 NOTE — Progress Notes (Unsigned)
HPI: [name] is a 77 y.o. female with PMHx significant for  dementia type 2 diabetes, hyperlipidemia, hypertension, CKD stage IIIb, CAD, peripheral artery disease, vascular dementia, seizure disorder, OSA, and COPD here today with her daughter for chronic disease management.  Last seen on 10/16/21. Since her last visit she has been evaluated in the ED 02/25/22 for abdominal pain and follow up on 02/26/22. She has not c/o abdominal pain, appetite is good,no changes in bowel movements or blood in stool.  Lab Results  Component Value Date   LIPASE 47 02/25/2022   Lab Results  Component Value Date   WBC 9.5 02/25/2022   HGB 11.4 (L) 02/25/2022   HCT 35.3 (L) 02/25/2022   MCV 89.6 02/25/2022   PLT 232 02/25/2022   Lab Results  Component Value Date   CREATININE 1.47 (H) 02/25/2022   BUN 28 (H) 02/25/2022   NA 137 02/25/2022   K 3.5 02/25/2022   CL 112 (H) 02/25/2022   CO2 18 (L) 02/25/2022   Lab Results  Component Value Date   ALT 14 02/25/2022   AST 23 02/25/2022   ALKPHOS 56 02/25/2022   BILITOT 0.4 02/25/2022   Abdominal- pelvic CT done 02/25/22 reveled no acute findings in the abdomen or pelvis. Heterogeneously enhancing cortical lesion in the left kidney is suspicious for neoplasm.  2.1 cm left adrenal nodule is indeterminate. Extensive atherosclerotic disease with occlusion of the bilateral external iliac arteries with reconstitution of flow distally, severe stenosis of the origin of the left renal artery, and moderate stenosis of the proximal SMA and celiac artery. Colonic diverticulosis without evidence of acute diverticulitis. She has not noted urinary symptoms or abnormal wt loss. She is not interested in further work up or invasive procedures.  Diabetes Mellitus II:  - Checking BG at home: Fasting 80 to low 100's, post prandial 150-180's, a few before meals 50's.  She is on Lantus at 20 units and NovoLog on a sliding scale. She is not living with her  daughter, so she has made positive dietary changes. Planning on starting a routine exercise program with ehr daughter. She uses her walker at home.  Negative for symptoms of hypoglycemia, polyuria, polydipsia, foot ulcers/trauma. + Peripheral neuropathy with burning and numbness LE's/feet.  Lab Results  Component Value Date   HGBA1C 6.8 (H) 10/02/2021   Lab Results  Component Value Date   MICROALBUR 19.8 (H) 09/18/2021   Hypertension:  Medications:Amlodipine 10 mg daily, Imdur 30 mg daily,Losartan 50 mg daily,Metoprolol  tartrate 50 mg bid,Hydralazine 25 mg tid,, spironolactone 25 mg daily.  Negative for unusual or severe headache, visual changes, exertional chest pain, new focal weakness, or edema. CKD III: She has not noted foam in urine,gross hematuria,or decreased urine output.  Lab Results  Component Value Date   CREATININE 1.47 (H) 02/25/2022   BUN 28 (H) 02/25/2022   NA 137 02/25/2022   K 3.5 02/25/2022   CL 112 (H) 02/25/2022   CO2 18 (L) 02/25/2022   COPD: She experiences occasional shortness of breath with moderate physical activity, stable. She is on albuterol inh as needed and using Spiriva 180 mcg inh cap daily as needed . She also takes Symbicort 160-4.5 mcg bid but has expressed concerns about its cost and is seeking a more affordable alternative. She has been referred to pulmonologist,office call on 10/14/21, appt was not arranged. Seen in 12/2016 for OSA.  Since her last visit she has seen her neurologist, Dr. Delice Lesch, whom she consults  annually. She is on Dilantin and Lamictal.  HLD on rosuvastatin 40 mg daily.Marland Kitchen  PAD: She is scheduled for an upcoming vascular Doppler test and follows with vascular. + Claudication like symptoms upon walking short distances. No cyanosis or skin ulcers. On Plavix 75 mg daily and Aspirin 81 mg daily.  Today she c/o dizziness, like "swimming in the head", no associated headache, MS changes, hearing changes, N/V. This has been  going on for sometime, stable overall.  Review of Systems  Constitutional:  Negative for chills and fatigue.  Respiratory:  Negative for cough and wheezing.   Gastrointestinal:  Negative for abdominal pain.       Constipation has improved.  Endocrine: Negative for cold intolerance and heat intolerance.  Genitourinary:  Negative for difficulty urinating and dysuria.  Musculoskeletal:  Positive for gait problem and myalgias.  Skin:  Negative for rash.  Neurological:  Positive for dizziness. Negative for syncope.  See other pertinent positives and negatives in HPI.  Current Outpatient Medications on File Prior to Visit  Medication Sig Dispense Refill   albuterol (VENTOLIN HFA) 108 (90 Base) MCG/ACT inhaler Inhale 2 puffs into the lungs every 6 (six) hours as needed for wheezing or shortness of breath. 1 each 6   amLODipine (NORVASC) 10 MG tablet TAKE ONE TABLET BY MOUTH EVERY DAY 90 tablet 1   ASPIRIN LOW DOSE 81 MG tablet TAKE ONE TABLET BY MOUTH EVERY DAY 30 tablet 11   budesonide-formoterol (SYMBICORT) 160-4.5 MCG/ACT inhaler Inhale 2 puffs into the lungs 2 (two) times daily. 10.2 g 3   clopidogrel (PLAVIX) 75 MG tablet TAKE ONE TABLET BY MOUTH EVERY DAY 30 tablet 5   ezetimibe (ZETIA) 10 MG tablet TAKE ONE TABLET BY MOUTH EVERY DAY 30 tablet 5   FEROSUL 325 (65 Fe) MG tablet TAKE ONE TABLET BY MOUTH EVERY OTHER DAY (EVERY TWO DAYS) WITH FOUR OUNCES ORANGE JUICE 100 tablet 3   fluticasone (FLONASE) 50 MCG/ACT nasal spray Place 2 sprays into both nostrils daily. 16 g 6   furosemide (LASIX) 20 MG tablet Take 1 tablet (20 mg total) by mouth daily as needed for edema. 30 tablet 3   glucose blood (TRUE METRIX BLOOD GLUCOSE TEST) test strip Use to test blood sugars 1-2 times daily. Dx:E11.9 200 each 12   GNP PAIN RELIEF EX-STRENGTH 500 MG tablet TAKE ONE TABLET BY MOUTH FOUR TIMES DAILY 120 tablet 0   GNP VITAMIN D MAXIMUM STRENGTH 50 MCG (2000 UT) TABS TAKE ONE CAPSULE BY MOUTH EVERY DAY 30  tablet 11   hydrALAZINE (APRESOLINE) 25 MG tablet TAKE ONE TABLET BY MOUTH EVERY 8 HOURS 90 tablet 3   HYDROcodone-acetaminophen (NORCO/VICODIN) 5-325 MG tablet Take 1 tablet by mouth every 6 (six) hours as needed for moderate pain.     Insulin Aspart FlexPen (NOVOLOG) 100 UNIT/ML INJECT subcutaneously THREE TIMES DAILY PER sliding scales BLOOD SUGAR (150 NO INSULIN)+ 151-180 Give FOUR units. 181-220 Give SIX units, 221-260 Give EIGHT units, 261-300 Give 10 units 301-340 Give 12 units AND > 341 Give 14 units. Max dose of 36 units. 15 mL 3   insulin glargine (LANTUS SOLOSTAR) 100 UNIT/ML Solostar Pen INJECT 20 units AT BEDTIME 15 mL 4   Insulin Pen Needle (PEN NEEDLES) 32G X 4 MM MISC To use with both insulins. 200 each 3   isosorbide mononitrate (IMDUR) 30 MG 24 hr tablet TAKE ONE TABLET BY MOUTH EVERY DAY 30 tablet 5   lamoTRIgine (LAMICTAL) 200 MG tablet TAKE 1.5 TABLETS  BY MOUTH TWICE DAILY 270 tablet 3   losartan (COZAAR) 50 MG tablet TAKE ONE TABLET BY MOUTH EVERY DAY 30 tablet 5   metoprolol tartrate (LOPRESSOR) 50 MG tablet Take 1 tablet (50 mg total) by mouth 2 (two) times daily. 60 tablet 5   Omega-3 Fatty Acids (FISH OIL) 1000 MG CAPS TAKE TWO CAPSULES BY MOUTH EVERY DAY 60 capsule 5   phenytoin (DILANTIN) 100 MG ER capsule Take 3 capsules (300 mg total) by mouth daily. (Take with 30mg  tablet for total of 330mg  every night) 270 capsule 3   phenytoin (DILANTIN) 30 MG ER capsule Take 1 capsule (30 mg total) by mouth at bedtime (take with Dilantin 300mg  for total of 330mg  every night) 90 capsule 3   polyvinyl alcohol (ARTIFICIAL TEARS) 1.4 % ophthalmic solution Place 1 drop into both eyes in the morning and at bedtime. 15 mL 6   rosuvastatin (CRESTOR) 40 MG tablet TAKE ONE TABLET BY MOUTH EVERY DAY 30 tablet 5   Spacer/Aero Chamber Mouthpiece MISC 1 Device by Does not apply route daily as needed. 1 each 0   SPIRIVA HANDIHALER 18 MCG inhalation capsule place ONE capsule into inhaler AND inhale  EVERY DAY 30 capsule 5   spironolactone (ALDACTONE) 25 MG tablet Take 1 tablet (25 mg total) by mouth daily. 30 tablet 5   tiZANidine (ZANAFLEX) 4 MG tablet TAKE 1/2 TABLET BY MOUTH EVERY 12 HOURS AS NEEDED muscle SPASMS 30 tablet 1   ipratropium-albuterol (DUONEB) 0.5-2.5 (3) MG/3ML SOLN Take 3 mLs by nebulization 2 (two) times daily. (Patient not taking: Reported on 11/24/2021) 180 mL 0   No current facility-administered medications on file prior to visit.    Past Medical History:  Diagnosis Date   Allergy    Arthritis    Bipolar 1 disorder ([place])    Blood transfusion without reported diagnosis    CAD (coronary artery disease) 04/17/2015   Carotid artery stenosis    50-69% bilateral ICA stenoses by velocity criteria but no visible plaque and ICA/CCA ratio 1.72 on right and 1.38 on left   Chronic airway obstruction, not elsewhere classified 03/02/2010   Overview:  Chronic Obstructive Pulmonary Disease   CKD (chronic kidney disease) stage 3, GFR 30-59 ml/min (HCC) 09/18/2021   Coronary artery disease    Depression    Diabetes mellitus without complication (HCC)    Echocardiogram    Echo 06/2018: Hyperdynamic systolic function, EF [id number], mild concentric LVH, elevated LVEDP (E/E' suggests impaired relaxation), mild AI, lipomatous interatrial septum   Hypertension    Left adrenal mass (HCC)    2.5 cm L adrenal mass on CT per note of Dr. Seleta Rhymes 05/20/2014   MI, old    PAD (peripheral artery disease) ([place])    03/2014 - CT aortogram with lower extremity runoff (mild to moderate disease in common iliac arteries, complete occulusion of her bilateral external iliac arteries with heavily disease common femoral arteries. She also has disease to her SFAs bilaterally. Popliteal arteries and tibial vessel runoff appears to be adequate)   Seizures (HCC)    Stroke (HCC)    Allergies  Allergen Reactions   Amoxicillin Other (See Comments)    colitis   Augmentin [Amoxicillin-Pot Clavulanate] Other (See  Comments)    colitis   Cephalexin Other (See Comments)    Colitis - ceftriaxone ok    Social History   Socioeconomic History   Marital status: Widowed    Spouse name: Not on file   Number of children: 2  Years of education: Not on file   Highest education level: Not on file  Occupational History   Not on file  Tobacco Use   Smoking status: Former    Packs/day: 1    Types: Cigarettes    Quit date: 2015    Years since quitting: 9.2   Smokeless tobacco: Never  Vaping Use   Vaping Use: Never used  Substance and Sexual Activity   Alcohol use: No   Drug use: No   Sexual activity: Not on file  Other Topics Concern   Not on file  Social History Narrative   Pt lives in assisted living facility   Has 2 adult children, she is widowed, husband died in his early 31's in a work related accident   [place]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   CAFFEINE 1/2  COFFEE   Right handed   One level    retired   Investment banker, operational of Radio broadcast assistant Strain: [place]  (11/27/2019)   Overall Financial Resource Strain (CARDIA)    Difficulty of Paying Living Expenses: Not hard at all  Food Insecurity: No [place] (10/05/2021)   Hunger Vital Sign    Worried About Running Out of Food in the Last Year: Never true    [place] in the Last Year: Never true  Transportation Needs: No Transportation Needs (10/05/2021)   PRAPARE - Hydrologist (Medical): No    Lack of Transportation (Non-Medical): No  Physical Activity: Inactive (11/27/2019)   Exercise Vital Sign    Days of Exercise per Week: 0 days    Minutes of Exercise per Session: 0 min  Stress: No Stress Concern Present (11/27/2019)   [place]    Feeling of Stress : Not at all  Social Connections: Socially Isolated (11/27/2019)   Social Connection and Isolation Panel [NHANES]    Frequency of Communication with Friends and  Family: More than three times a week    Frequency of Social Gatherings with Friends and Family: More than three times a week    Attends Religious Services: Never    Marine scientist or Organizations: No    Attends Archivist Meetings: Never    Marital Status: Widowed   Today's Vitals   04/16/22 1129  BP: 122/80  Pulse: 82  Resp: 16  SpO2: 99%  Weight: 132 lb (59.9 kg)  Height: 5\' 3"  (1.6 m)   Body mass index is 23.38 kg/m.  Physical Exam Vitals and nursing note reviewed.  Constitutional:      General: She is not in acute distress.    Appearance: She is well-developed.  HENT:     Head: Normocephalic and atraumatic.     Mouth/Throat:     Mouth: Mucous membranes are moist.     Pharynx: Oropharynx is clear.  Eyes:     Conjunctiva/sclera: Conjunctivae normal.  Cardiovascular:     Rate and Rhythm: Normal rate and regular rhythm.     Heart sounds: No murmur heard.    Comments: PT pulses palpable. Pulmonary:     Effort: Pulmonary effort is normal. No respiratory distress.     Breath sounds: Normal breath sounds.  Abdominal:     Palpations: Abdomen is soft. There is no hepatomegaly or mass.     Tenderness: There is no abdominal tenderness.  Lymphadenopathy:     Cervical: No cervical adenopathy.  Skin:  General: Skin is warm.     Findings: No erythema or rash.  Neurological:     General: No focal deficit present.     Mental Status: She is alert. Mental status is at baseline.     Cranial Nerves: No cranial nerve deficit.     Comments: Wheel chair.  Psychiatric:     Comments: Well groomed, good eye contact.   ASSESSMENT AND PLAN:  Ms. Pammie was seen today for medical management of chronic issues.  Diagnoses and all orders for this visit: Lab Results  Component Value Date   HGBA1C 5.7 04/16/2022   Hypertension with heart disease Assessment & Plan: Resistance HTN. BP adequately controlled. Continue Amlodipine 10 mg daily, Imdur 30 mg  daily,Losartan 50 mg daily,Metoprolol  tartrate 50 mg bid,Hydralazine 25 mg tid,, spironolactone 25 mg daily.  Low salt diet to continue. Monitor BP at home.   Diabetes mellitus type 2 with neurological manifestations [facility]) Assessment & Plan: Problem is well controlled. HgA1C at goal, went from 6.8 to 5.7.  Decrease Lantus from 20 U to 15 U. Continue Novolin per sliding scale. Annual eye exam and foot care to continue. F/U in 4 months  Orders: -     POCT glycosylated hemoglobin (Hb A1C) -     FreeStyle Libre 3 Sensor; Place 1 sensor on the skin every 14 days. Use to check glucose continuously  Dispense: 6 each; Refill: 2 -     FreeStyle Libre 3 Reader; 1 Device by Does not apply route as directed.  Dispense: 1 each; Refill: 1 -     Lantus SoloStar; Inject 15 Units into the skin daily. INJECT 20 units AT BEDTIME  Dispense: 15 mL; Refill: 1  Stage 3a chronic kidney disease ([place]) Assessment & Plan: Problem has been stable. Cr 1.2-1.4 and e GFR 40's for the past 6 months. Continue low salt diet, adequate hydration,and avoidance of NSAID's. Also adequate BP and glucose controlled.   Dizziness Assessment & Plan: We discussed possible etiologies. Some of her co-morbilities and medications can be contributing factors. Fall precautions discussed. Recommend mentioning this problem to cardiologist and neurologist.   Coronary artery disease involving native coronary artery of native heart with angina pectoris [facility]) Assessment & Plan: Asymptomatic. Following with cardiologist. Continue Aspirin 81 mg daily,Plavix 75 mg daily, Zetia 10 mg daily, and Crestor 40 mg daily.   Hyperlipidemia associated with type 2 diabetes mellitus ([place]) Assessment & Plan: Last LDL 61 in 09/2021. Continue Zetia 10 mg daily and Rosuvastatin 40 mg daily.   Neoplasm of left kidney seen on abdominal CT 02/25/22 Assessment & Plan: Asymptomatic. We discussed abdominal/pelvic CT findings and possible etiologies  as well as radiologist's recommendations about MRI to further characterization of lesion. She reiterated her desire of not pursuing further work up or treatments and her daughter supports her decision.    Chronic obstructive pulmonary disease, unspecified COPD type ([place]) Assessment & Plan: Symptoms are otherwise stable. She can continue Symbicort 160-4.5 mcg daily as needed. Recommend daily Spiriva inh cap. Continue Albuterol inh 2 puff qid prn.   Seizure disorder as sequela of cerebrovascular accident [facility]) Assessment & Plan: On Dilantin and Lamictal. Continue following with neurologist.   I spent a total of 44 minutes in both face to face and non face to face activities for this visit on the date of this encounter. During this time history was obtained and documented, examination was performed, prior labs/imaging reviewed, and assessment/plan discussed.  Return in about 14 weeks (around 07/23/2022)  for chronic problems.  Avel Ogawa G. [place], MD  [facility]. [place] office.

## 2022-04-15 ENCOUNTER — Telehealth: Payer: Self-pay | Admitting: Family Medicine

## 2022-04-15 NOTE — Telephone Encounter (Signed)
[name] called and stated that pt needs a new rx sent for testing strips called [employer]. Pt was given testing stripes while at the Nursing facility but pt is now living at home with [facility] and would need testing strips. Sent to  [place], [place] Phone: [phone]  Fax: [phone]     [name] also sched an appt w/ Dr. On Ludwig Clarks. 3/22 at 11:30 am to discuss about pt blood sugar.   Please advise.

## 2022-04-16 ENCOUNTER — Ambulatory Visit (INDEPENDENT_AMBULATORY_CARE_PROVIDER_SITE_OTHER): Payer: HMO | Admitting: Family Medicine

## 2022-04-16 ENCOUNTER — Encounter: Payer: Self-pay | Admitting: Family Medicine

## 2022-04-16 VITALS — BP 122/80 | HR 82 | Resp 16 | Ht 63.0 in | Wt 132.0 lb

## 2022-04-16 DIAGNOSIS — G40909 Epilepsy, unspecified, not intractable, without status epilepticus: Secondary | ICD-10-CM

## 2022-04-16 DIAGNOSIS — I25119 Atherosclerotic heart disease of native coronary artery with unspecified angina pectoris: Secondary | ICD-10-CM

## 2022-04-16 DIAGNOSIS — N1831 Chronic kidney disease, stage 3a: Secondary | ICD-10-CM | POA: Diagnosis not present

## 2022-04-16 DIAGNOSIS — E1149 Type 2 diabetes mellitus with other diabetic neurological complication: Secondary | ICD-10-CM

## 2022-04-16 DIAGNOSIS — E785 Hyperlipidemia, unspecified: Secondary | ICD-10-CM

## 2022-04-16 DIAGNOSIS — R42 Dizziness and giddiness: Secondary | ICD-10-CM | POA: Diagnosis not present

## 2022-04-16 DIAGNOSIS — I119 Hypertensive heart disease without heart failure: Secondary | ICD-10-CM

## 2022-04-16 DIAGNOSIS — D49512 Neoplasm of unspecified behavior of left kidney: Secondary | ICD-10-CM | POA: Diagnosis not present

## 2022-04-16 DIAGNOSIS — E1169 Type 2 diabetes mellitus with other specified complication: Secondary | ICD-10-CM

## 2022-04-16 DIAGNOSIS — G40209 Localization-related (focal) (partial) symptomatic epilepsy and epileptic syndromes with complex partial seizures, not intractable, without status epilepticus: Secondary | ICD-10-CM

## 2022-04-16 DIAGNOSIS — J449 Chronic obstructive pulmonary disease, unspecified: Secondary | ICD-10-CM

## 2022-04-16 DIAGNOSIS — I69398 Other sequelae of cerebral infarction: Secondary | ICD-10-CM | POA: Diagnosis not present

## 2022-04-16 LAB — POCT GLYCOSYLATED HEMOGLOBIN (HGB A1C): HbA1c, POC (prediabetic range): 5.7 % (ref 5.7–6.4)

## 2022-04-16 MED ORDER — LANTUS SOLOSTAR 100 UNIT/ML ~~LOC~~ SOPN: 15.0000 [IU] | PEN_INJECTOR | Freq: Every day | SUBCUTANEOUS | 1 refills | Status: DC

## 2022-04-16 MED ORDER — FREESTYLE LIBRE 3 SENSOR MISC: 2 refills | Status: DC

## 2022-04-16 MED ORDER — FREESTYLE LIBRE 3 READER DEVI: 1.0000 | 1 refills | Status: DC

## 2022-04-16 MED ORDER — TRUE METRIX BLOOD GLUCOSE TEST VI STRP: ORAL_STRIP | 12 refills | Status: DC

## 2022-04-16 NOTE — Assessment & Plan Note (Signed)
Resistance HTN. BP adequately controlled. Continue Amlodipine 10 mg daily, Imdur 30 mg daily,Losartan 50 mg daily,Metoprolol  tartrate 50 mg bid,Hydralazine 25 mg tid,, spironolactone 25 mg daily.  Low salt diet to continue. Monitor BP at home.

## 2022-04-16 NOTE — Patient Instructions (Addendum)
A few things to remember from today's visit:  Hypertension with heart disease  Diabetes mellitus type 2 with neurological manifestations ([place]) - Plan: POC HgB A1c, Continuous Blood Gluc Sensor (FREESTYLE LIBRE 3 SENSOR) MISC, Continuous Blood Gluc Receiver (FREESTYLE LIBRE 3 READER) DEVI, insulin glargine (LANTUS SOLOSTAR) 100 UNIT/ML Solostar Pen  Stage 3a chronic kidney disease (HCC)  Dizziness  Lantus decreased from 20 U to 15 U. No changes in rest. Fall precautions.  If you need refills for medications you take chronically, please call your pharmacy. Do not use My Chart to request refills or for acute issues that need immediate attention. If you send a my chart message, it may take a few days to be addressed, specially if I am not in the office.  Please be sure medication list is accurate. If a new problem present, please set up appointment sooner than planned today.

## 2022-04-16 NOTE — Telephone Encounter (Signed)
Rx sent 

## 2022-04-18 NOTE — Assessment & Plan Note (Signed)
Asymptomatic. We discussed abdominal/pelvic CT findings and possible etiologies as well as radiologist's recommendations about MRI to further characterization of lesion. She reiterated her desire of not pursuing further work up or treatments and her daughter supports her decision.

## 2022-04-18 NOTE — Assessment & Plan Note (Signed)
Symptoms are otherwise stable. She can continue Symbicort 160-4.5 mcg daily as needed. Recommend daily Spiriva inh cap. Continue Albuterol inh 2 puff qid prn.

## 2022-04-18 NOTE — Assessment & Plan Note (Addendum)
Problem has been stable. Cr 1.2-1.4 and e GFR 40's for the past 6 months. Continue low salt diet, adequate hydration,and avoidance of NSAID's. Also adequate BP and glucose controlled.

## 2022-04-18 NOTE — Assessment & Plan Note (Signed)
On Dilantin and Lamictal. Continue following with neurologist.

## 2022-04-18 NOTE — Assessment & Plan Note (Signed)
Asymptomatic. Following with cardiologist. Continue Aspirin 81 mg daily,Plavix 75 mg daily, Zetia 10 mg daily, and Crestor 40 mg daily.

## 2022-04-18 NOTE — Assessment & Plan Note (Signed)
We discussed possible etiologies. Some of her co-morbilities and medications can be contributing factors. Fall precautions discussed. Recommend mentioning this problem to cardiologist and neurologist.

## 2022-04-18 NOTE — Assessment & Plan Note (Signed)
Last LDL 61 in 09/2021. Continue Zetia 10 mg daily and Rosuvastatin 40 mg daily.

## 2022-04-18 NOTE — Assessment & Plan Note (Signed)
Problem is well controlled. HgA1C at goal, went from 6.8 to 5.7.  Decrease Lantus from 20 U to 15 U. Continue Novolin per sliding scale. Annual eye exam and foot care to continue. F/U in 4 months

## 2022-04-19 ENCOUNTER — Other Ambulatory Visit: Payer: Self-pay | Admitting: Family Medicine

## 2022-04-19 DIAGNOSIS — I10 Essential (primary) hypertension: Secondary | ICD-10-CM

## 2022-04-20 ENCOUNTER — Other Ambulatory Visit: Payer: Self-pay | Admitting: Family Medicine

## 2022-04-20 DIAGNOSIS — M545 Low back pain, unspecified: Secondary | ICD-10-CM

## 2022-04-29 ENCOUNTER — Ambulatory Visit (HOSPITAL_COMMUNITY)
Admission: RE | Admit: 2022-04-29 | Discharge: 2022-04-29 | Disposition: A | Discharge status: Home / Self Care | Payer: HMO | Source: Ambulatory Visit | Attending: Vascular Surgery | Admitting: Vascular Surgery

## 2022-04-29 ENCOUNTER — Ambulatory Visit: Payer: HMO | Admitting: Vascular Surgery

## 2022-04-29 ENCOUNTER — Encounter: Payer: Self-pay | Admitting: Vascular Surgery

## 2022-04-29 VITALS — BP 165/76 | HR 63 | Temp 97.9°F | Resp 20 | Ht 63.0 in | Wt 132.0 lb

## 2022-04-29 DIAGNOSIS — I701 Atherosclerosis of renal artery: Secondary | ICD-10-CM

## 2022-04-29 DIAGNOSIS — I70219 Atherosclerosis of native arteries of extremities with intermittent claudication, unspecified extremity: Secondary | ICD-10-CM | POA: Diagnosis not present

## 2022-04-29 DIAGNOSIS — K551 Chronic vascular disorders of intestine: Secondary | ICD-10-CM

## 2022-04-29 DIAGNOSIS — I739 Peripheral vascular disease, unspecified: Secondary | ICD-10-CM | POA: Insufficient documentation

## 2022-04-29 LAB — VAS US ABI WITH/WO TBI
Left ABI: 0.51
Right ABI: 0.46

## 2022-04-29 NOTE — Progress Notes (Signed)
ASSESSMENT & PLAN   PERIPHERAL ARTERIAL DISEASE WITH CLAUDICATION: This patient has stable claudication of both lower extremities.  Her activity is fairly limited.  She does not have any rest pain or nonhealing ulcers.  Therefore I would not recommend an aggressive approach to her multilevel peripheral arterial disease.  Given the severity of her disease however I think we should follow her closely and have ordered follow-up ABIs in 9 months and an office visit with the PAs at that time.  I encouraged her to stay as active as possible.  We discussed importance of nutrition.  Fortunately she is not a smoker.  She is on aspirin and is on a statin.  We would only consider further workup if she developed rest pain or a nonhealing ulcer.  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  LEFT RENAL ARTERY STENOSIS: She has a left renal artery stenosis.  Her blood pressure is under good control on her current meds.  I would not recommend an aggressive approach to this.  LEFT KIDNEY MASS: Radiology had suggested obtaining a renal MRI.  I will leave that to the discretion of her primary care doctor Dr. Martinique.   REASON FOR CONSULT:    Peripheral arterial disease.  The consult is requested by the [facility] emergency department.  HPI:   Kathryn Lewis is a 77 y.o. female who was seen on 02/25/2022 at [facility] emergency department with abdominal pain.  This prompted a CT of the abdomen and pelvis.  An incidental finding was occlusion of both external iliac arteries.  She had diffuse multilevel severe atherosclerotic disease.  On my history she has stable claudication of both lower extremities.  She ambulates with a walker but goes only short distances.  She experiences hip thigh and calf claudication bilaterally.  Symptoms are slightly more significant on the left side.  She denies any rest pain or nonhealing ulcers.  She was  also noted to have some moderate disease of her superior mesenteric artery.  She denies any postprandial abdominal pain or weight loss.  Past Medical History:  Diagnosis Date   Allergy    Arthritis    Bipolar 1 disorder    Blood transfusion without reported diagnosis    CAD (coronary artery disease) 04/17/2015   Carotid artery stenosis    50-69% bilateral ICA stenoses by velocity criteria but no visible plaque and ICA/CCA ratio 1.72 on right and 1.38 on left   Chronic airway obstruction, not elsewhere classified 03/02/2010   Overview:  Chronic Obstructive Pulmonary Disease   CKD (chronic kidney disease) stage 3, GFR 30-59 ml/min 09/18/2021   Coronary artery disease    Depression    Diabetes mellitus without complication    Echocardiogram    Echo 06/2018: Hyperdynamic systolic function, EF 99991111, mild concentric LVH, elevated LVEDP (E/E' suggests impaired relaxation), mild AI, lipomatous interatrial septum   Hypertension    Left adrenal mass    2.5 cm L adrenal mass on CT per note of Dr. Seleta Rhymes 05/20/2014   MI, old    PAD (peripheral artery disease)    03/2014 - CT aortogram with lower extremity runoff (mild to moderate disease in common iliac arteries, complete occulusion of her bilateral external iliac arteries with heavily disease common femoral arteries. She also has disease to her SFAs bilaterally. Popliteal arteries and tibial vessel runoff appears to be adequate)   Seizures    Stroke  Family History  Problem Relation Age of Onset   Hypertension Mother    Arthritis Mother    Diabetes Mother    Early death Mother    Hyperlipidemia Mother    Stroke Mother    Hypertension Father    Alcohol abuse Father    Early death Father    Hyperlipidemia Father    COPD Father    Heart attack Father    Hypertension Sister    Birth defects Brother    Depression Brother    Hyperlipidemia Brother    Hypertension Brother    Arthritis Daughter    Asthma Daughter    COPD Daughter     Arthritis Son    COPD Son    Hypertension Son    Hyperlipidemia Son    Heart attack Son    Arthritis Maternal Grandmother    Cancer Maternal Grandmother     SOCIAL HISTORY: Social History   Tobacco Use   Smoking status: Former    Packs/day: 1    Types: Cigarettes    Quit date: 2015    Years since quitting: 9.2   Smokeless tobacco: Never  Substance Use Topics   Alcohol use: No    Allergies  Allergen Reactions   Amoxicillin Other (See Comments)    colitis   Augmentin [Amoxicillin-Pot Clavulanate] Other (See Comments)    colitis   Cephalexin Other (See Comments)    Colitis - ceftriaxone ok    Current Outpatient Medications  Medication Sig Dispense Refill   albuterol (VENTOLIN HFA) 108 (90 Base) MCG/ACT inhaler Inhale 2 puffs into the lungs every 6 (six) hours as needed for wheezing or shortness of breath. 1 each 6   amLODipine (NORVASC) 10 MG tablet TAKE ONE TABLET BY MOUTH EVERY DAY 90 tablet 1   ASPIRIN LOW DOSE 81 MG tablet TAKE ONE TABLET BY MOUTH EVERY DAY 30 tablet 11   budesonide-formoterol (SYMBICORT) 160-4.5 MCG/ACT inhaler Inhale 2 puffs into the lungs 2 (two) times daily. 10.2 g 3   clopidogrel (PLAVIX) 75 MG tablet TAKE ONE TABLET BY MOUTH EVERY DAY 30 tablet 5   Continuous Blood Gluc Receiver (FREESTYLE LIBRE 3 READER) DEVI 1 Device by Does not apply route as directed. 1 each 1   Continuous Blood Gluc Sensor (FREESTYLE LIBRE 3 SENSOR) MISC Place 1 sensor on the skin every 14 days. Use to check glucose continuously 6 each 2   ezetimibe (ZETIA) 10 MG tablet TAKE ONE TABLET BY MOUTH EVERY DAY 30 tablet 5   FEROSUL 325 (65 Fe) MG tablet TAKE ONE TABLET BY MOUTH EVERY OTHER DAY (EVERY TWO DAYS) WITH FOUR OUNCES ORANGE JUICE 100 tablet 3   furosemide (LASIX) 20 MG tablet Take 1 tablet (20 mg total) by mouth daily as needed for edema. 30 tablet 3   glucose blood (TRUE METRIX BLOOD GLUCOSE TEST) test strip Use to test blood sugars 1-2 times daily. Dx:E11.9 200 each 12    GNP PAIN RELIEF EX-STRENGTH 500 MG tablet TAKE ONE TABLET BY MOUTH FOUR TIMES DAILY 120 tablet 0   GNP VITAMIN D MAXIMUM STRENGTH 50 MCG (2000 UT) TABS TAKE ONE CAPSULE BY MOUTH EVERY DAY 30 tablet 11   hydrALAZINE (APRESOLINE) 25 MG tablet TAKE ONE TABLET BY MOUTH EVERY 8 HOURS 90 tablet 3   Insulin Aspart FlexPen (NOVOLOG) 100 UNIT/ML INJECT subcutaneously THREE TIMES DAILY PER sliding scales BLOOD SUGAR (150 NO INSULIN)+ 151-180 Give FOUR units. 181-220 Give SIX units, 221-260 Give EIGHT units, 261-300 Give 10 units  301-340 Give 12 units AND > 341 Give 14 units. Max dose of 36 units. 15 mL 3   insulin glargine (LANTUS SOLOSTAR) 100 UNIT/ML Solostar Pen Inject 15 Units into the skin daily. INJECT 20 units AT BEDTIME 15 mL 1   Insulin Pen Needle (PEN NEEDLES) 32G X 4 MM MISC To use with both insulins. 200 each 3   isosorbide mononitrate (IMDUR) 30 MG 24 hr tablet TAKE ONE TABLET BY MOUTH EVERY DAY 30 tablet 5   lamoTRIgine (LAMICTAL) 200 MG tablet TAKE 1.5 TABLETS BY MOUTH TWICE DAILY 270 tablet 3   losartan (COZAAR) 50 MG tablet TAKE ONE TABLET BY MOUTH EVERY DAY 30 tablet 5   metoprolol tartrate (LOPRESSOR) 50 MG tablet Take 1 tablet (50 mg total) by mouth 2 (two) times daily. 60 tablet 5   Omega-3 Fatty Acids (FISH OIL) 1000 MG CAPS TAKE TWO CAPSULES BY MOUTH EVERY DAY 60 capsule 5   phenytoin (DILANTIN) 100 MG ER capsule Take 3 capsules (300 mg total) by mouth daily. (Take with 30mg  tablet for total of 330mg  every night) 270 capsule 3   phenytoin (DILANTIN) 30 MG ER capsule Take 1 capsule (30 mg total) by mouth at bedtime (take with Dilantin 300mg  for total of 330mg  every night) 90 capsule 3   polyvinyl alcohol (ARTIFICIAL TEARS) 1.4 % ophthalmic solution Place 1 drop into both eyes in the morning and at bedtime. 15 mL 6   rosuvastatin (CRESTOR) 40 MG tablet TAKE ONE TABLET BY MOUTH EVERY DAY 30 tablet 5   Spacer/Aero Chamber Mouthpiece MISC 1 Device by Does not apply route daily as needed. 1  each 0   SPIRIVA HANDIHALER 18 MCG inhalation capsule place ONE capsule into inhaler AND inhale EVERY DAY 30 capsule 5   spironolactone (ALDACTONE) 25 MG tablet Take 1 tablet (25 mg total) by mouth daily. 30 tablet 5   spironolactone (ALDACTONE) 50 MG tablet TAKE ONE TABLET BY MOUTH EVERY DAY 90 tablet 1   tiZANidine (ZANAFLEX) 4 MG tablet TAKE 1/2 TABLET BY MOUTH EVERY 12 HOURS AS NEEDED muscle SPASMS 30 tablet 3   ipratropium-albuterol (DUONEB) 0.5-2.5 (3) MG/3ML SOLN Take 3 mLs by nebulization 2 (two) times daily. (Patient not taking: Reported on 11/24/2021) 180 mL 0   No current facility-administered medications for this visit.    REVIEW OF SYSTEMS:  [X]  denotes positive finding, [ ]  denotes negative finding Cardiac  Comments:  Chest pain or chest pressure:    Shortness of breath upon exertion: x   Short of breath when lying flat: x   Irregular heart rhythm:        Vascular    Pain in calf, thigh, or hip brought on by ambulation: x   Pain in feet at night that wakes you up from your sleep:     Blood clot in your veins:    Leg swelling:         Pulmonary    Oxygen at home:    Productive cough:     Wheezing:         Neurologic    Sudden weakness in arms or legs:     Sudden numbness in arms or legs:     Sudden onset of difficulty speaking or slurred speech:    Temporary loss of vision in one eye:  x   Problems with dizziness:  x       Gastrointestinal    Blood in stool:     Vomited blood:  Genitourinary    Burning when urinating:     Blood in urine:        Psychiatric    Major depression:         Hematologic    Bleeding problems:    Problems with blood clotting too easily: x       Skin    Rashes or ulcers:        Constitutional    Fever or chills:    -  PHYSICAL EXAM:   Vitals:   04/29/22 0927  BP: (!) 165/76  Pulse: 63  Resp: 20  Temp: 97.9 F (36.6 C)  SpO2: 96%  Weight: 132 lb (59.9 kg)  Height: 5\' 3"  (1.6 m)   Body mass index is 23.38  kg/m. GENERAL: The patient is a well-nourished female, in no acute distress. The vital signs are documented above. CARDIAC: There is a regular rate and rhythm.  VASCULAR: I do not detect carotid bruits. I cannot palpate femoral or pedal pulses bilaterally. She has no significant lower extremity swelling. PULMONARY: There is good air exchange bilaterally without wheezing or rales. ABDOMEN: Soft and non-tender with normal pitched bowel sounds.  MUSCULOSKELETAL: There are no major deformities. NEUROLOGIC: No focal weakness or paresthesias are detected. SKIN: There are no ulcers or rashes noted. PSYCHIATRIC: The patient has a normal affect.  DATA:    CT SCAN: I reviewed the images of the CT of the abdomen and pelvis.  There is extensive atherosclerotic disease with occlusion of both external iliac arteries.  There is a severe stenosis at the origin of the left renal artery and a moderate stenosis of the proximal SMA and celiac artery.  Additional findings include a cortical lesion in the left kidney suspicious for neoplasm.  An MRI was recommended.  There is also a 2.1 cm left adrenal nodule.  In addition there are prominent uterine and gonadal veins suspicious for pelvic venous congestion syndrome.  There is also some colonic diverticulosis.  ARTERIAL DOPPLER STUDY: I have independently interpreted her arterial Doppler study today.  On the right side there is a monophasic posterior tibial signal and dorsalis pedis signal.  ABIs 46%.  Toe pressures 48 mmHg.  On the left side there is a monophasic posterior tibial signal.  There is a biphasic dorsalis pedis signal.  ABIs 51%.  Toe pressure 71 mmHg.  Deitra Mayo [facility]

## 2022-05-05 ENCOUNTER — Ambulatory Visit: Payer: PPO | Admitting: Family Medicine

## 2022-05-05 ENCOUNTER — Telehealth: Payer: Self-pay | Admitting: Family Medicine

## 2022-05-05 DIAGNOSIS — E1149 Type 2 diabetes mellitus with other diabetic neurological complication: Secondary | ICD-10-CM

## 2022-05-05 MED ORDER — FREESTYLE PRECISION NEO TEST VI STRP: ORAL_STRIP | 12 refills | Status: DC

## 2022-05-05 MED ORDER — FREESTYLE LIBRE 3 SENSOR MISC: 2 refills | Status: DC

## 2022-05-05 NOTE — Telephone Encounter (Signed)
Rx sent in

## 2022-05-05 NOTE — Addendum Note (Signed)
Addended by: Kathreen Devoid on: 05/05/2022 03:11 PM   Modules accepted: Orders

## 2022-05-05 NOTE — Telephone Encounter (Signed)
Pt daughter called and stated that pt needs a Rx for [employer] Test Strips. This will be the first time that they are requesting this Rx.   Please send to  [facility] - [place], [place] - [address]  [facility] Phone: [phone]  Fax: [phone]     Please advise.

## 2022-05-05 NOTE — Telephone Encounter (Signed)
Rx corrected.

## 2022-05-05 NOTE — Telephone Encounter (Signed)
Pt called and says they requested test strips but the sensors were sent. Please send rx for [facility] Test Strips (Precision Neo)

## 2022-06-16 ENCOUNTER — Other Ambulatory Visit: Payer: Self-pay | Admitting: Family Medicine

## 2022-06-29 ENCOUNTER — Telehealth: Payer: Self-pay

## 2022-06-29 DIAGNOSIS — I119 Hypertensive heart disease without heart failure: Secondary | ICD-10-CM

## 2022-06-29 DIAGNOSIS — E1149 Type 2 diabetes mellitus with other diabetic neurological complication: Secondary | ICD-10-CM

## 2022-06-29 NOTE — Telephone Encounter (Signed)
-----   Message from Sherrill Raring, [facility] sent at 06/29/2022  9:23 AM EDT ----- Regarding: 2300-Pharmacy Referral Hello,   Patient is currently flagged as high risk for hospitalization and is eligible to meet with me through the Care Coordination program.   Could a 2300-Pharmacy referral for medication management be placed for the patient so that I can get them scheduled?   Thank you!  Sherrill Raring  Clinical Pharmacist  [phone]

## 2022-07-14 ENCOUNTER — Other Ambulatory Visit: Payer: Self-pay | Admitting: Family Medicine

## 2022-08-11 ENCOUNTER — Other Ambulatory Visit: Payer: Self-pay | Admitting: Family Medicine

## 2022-09-08 ENCOUNTER — Other Ambulatory Visit: Payer: Self-pay | Admitting: Family Medicine

## 2022-09-08 DIAGNOSIS — I119 Hypertensive heart disease without heart failure: Secondary | ICD-10-CM

## 2022-09-28 ENCOUNTER — Other Ambulatory Visit: Payer: Self-pay | Admitting: Family Medicine

## 2022-09-28 DIAGNOSIS — E1149 Type 2 diabetes mellitus with other diabetic neurological complication: Secondary | ICD-10-CM

## 2022-10-06 ENCOUNTER — Telehealth: Payer: Self-pay | Admitting: Family Medicine

## 2022-10-06 ENCOUNTER — Other Ambulatory Visit: Payer: Self-pay | Admitting: Family Medicine

## 2022-10-06 DIAGNOSIS — I10 Essential (primary) hypertension: Secondary | ICD-10-CM

## 2022-10-06 NOTE — Telephone Encounter (Signed)
Patient dropped off document  Emotional Support Animal Letter , to be filled out by provider. Patient requested to send it back via Call Patient to pick up within 5-days. Document is located in providers tray at front office.Please advise at Mobile [phone] (mobile)

## 2022-10-08 NOTE — Telephone Encounter (Signed)
In your bin

## 2022-10-11 NOTE — Telephone Encounter (Signed)
Form completed so she can have an animal/pet living with her.  IT does not authorize taking pet/animal in public transportation or into places when surrounded people do not feel comfortable with pet/animal in close proximity. Thanks, BJ

## 2022-10-12 DIAGNOSIS — Z20822 Contact with and (suspected) exposure to covid-19: Secondary | ICD-10-CM | POA: Diagnosis not present

## 2022-10-12 NOTE — Telephone Encounter (Signed)
I called and spoke with patient's daughter, she is aware form is ready for pick up. Form up front & copy sent to scan.

## 2022-10-20 DIAGNOSIS — J449 Chronic obstructive pulmonary disease, unspecified: Secondary | ICD-10-CM | POA: Diagnosis not present

## 2022-10-20 DIAGNOSIS — Z794 Long term (current) use of insulin: Secondary | ICD-10-CM | POA: Diagnosis not present

## 2022-10-20 DIAGNOSIS — I5030 Unspecified diastolic (congestive) heart failure: Secondary | ICD-10-CM | POA: Diagnosis not present

## 2022-10-20 DIAGNOSIS — Z515 Encounter for palliative care: Secondary | ICD-10-CM | POA: Diagnosis not present

## 2022-10-20 DIAGNOSIS — I6782 Cerebral ischemia: Secondary | ICD-10-CM | POA: Diagnosis not present

## 2022-10-20 DIAGNOSIS — N1832 Chronic kidney disease, stage 3b: Secondary | ICD-10-CM | POA: Diagnosis not present

## 2022-10-20 DIAGNOSIS — E1122 Type 2 diabetes mellitus with diabetic chronic kidney disease: Secondary | ICD-10-CM | POA: Diagnosis not present

## 2022-10-20 DIAGNOSIS — F0153 Vascular dementia, unspecified severity, with mood disturbance: Secondary | ICD-10-CM | POA: Diagnosis not present

## 2022-10-20 DIAGNOSIS — F319 Bipolar disorder, unspecified: Secondary | ICD-10-CM | POA: Diagnosis not present

## 2022-10-20 DIAGNOSIS — E261 Secondary hyperaldosteronism: Secondary | ICD-10-CM | POA: Diagnosis not present

## 2022-10-20 DIAGNOSIS — I110 Hypertensive heart disease with heart failure: Secondary | ICD-10-CM | POA: Diagnosis not present

## 2022-10-20 DIAGNOSIS — G319 Degenerative disease of nervous system, unspecified: Secondary | ICD-10-CM | POA: Diagnosis not present

## 2022-10-29 ENCOUNTER — Other Ambulatory Visit: Payer: Self-pay | Admitting: Family Medicine

## 2022-11-01 ENCOUNTER — Encounter: Payer: Self-pay | Admitting: Family Medicine

## 2022-11-01 ENCOUNTER — Ambulatory Visit (INDEPENDENT_AMBULATORY_CARE_PROVIDER_SITE_OTHER): Payer: PPO | Admitting: Family Medicine

## 2022-11-01 VITALS — BP 120/70 | HR 70 | Resp 16 | Ht 63.0 in

## 2022-11-01 DIAGNOSIS — E1149 Type 2 diabetes mellitus with other diabetic neurological complication: Secondary | ICD-10-CM | POA: Diagnosis not present

## 2022-11-01 DIAGNOSIS — Z7984 Long term (current) use of oral hypoglycemic drugs: Secondary | ICD-10-CM

## 2022-11-01 DIAGNOSIS — K1370 Unspecified lesions of oral mucosa: Secondary | ICD-10-CM | POA: Diagnosis not present

## 2022-11-01 DIAGNOSIS — I119 Hypertensive heart disease without heart failure: Secondary | ICD-10-CM | POA: Diagnosis not present

## 2022-11-01 DIAGNOSIS — N1831 Chronic kidney disease, stage 3a: Secondary | ICD-10-CM | POA: Diagnosis not present

## 2022-11-01 LAB — BASIC METABOLIC PANEL
BUN: 21 mg/dL (ref 6–23)
CO2: 26 meq/L (ref 19–32)
Calcium: 9.8 mg/dL (ref 8.4–10.5)
Chloride: 102 meq/L (ref 96–112)
Creatinine, Ser: 0.95 mg/dL (ref 0.40–1.20)
GFR: 58.03 mL/min — ABNORMAL LOW (ref >60.00)
Glucose, Bld: 125 mg/dL — ABNORMAL HIGH (ref 70–99)
Potassium: 3.9 meq/L (ref 3.5–5.1)
Sodium: 134 meq/L — ABNORMAL LOW (ref 135–145)

## 2022-11-01 LAB — HEMOGLOBIN A1C: Hgb A1c MFr Bld: 5.8 % (ref 4.6–6.5)

## 2022-11-01 NOTE — Patient Instructions (Addendum)
A few things to remember from today's visit:  Diabetes mellitus type 2 with neurological manifestations (HCC) - Plan: Microalbumin / creatinine urine ratio, Basic metabolic panel, Hemoglobin A1c  Oral mucosal lesion - Plan: Ambulatory referral to Oral Maxillofacial Surgery  Stage 3a chronic kidney disease (HCC) - Plan: Basic metabolic panel  We will miss you! Good luck in [place]!!! No changes today.  If you need refills for medications you take chronically, please call your pharmacy. Do not use My Chart to request refills or for acute issues that need immediate attention. If you send a my chart message, it may take a few days to be addressed, specially if I am not in the office.  Please be sure medication list is accurate. If a new problem present, please set up appointment sooner than planned today.

## 2022-11-01 NOTE — Progress Notes (Signed)
ACUTE VISIT Chief Complaint  Patient presents with   Mass    Bump inside lower lip, seen at urgent care on 9/26.   HPI: [name] is a 77 y.o. female with a PMHx significant for dementia, DM II, HLD, HTN, CKD III, CAD, peripheral artery disease, vascular dementia, seizure disorder, OSA, and COPD, who is here today with her daughter complaining of a lesion inside her lower lip.   She complains of a lesion on the inside of her lower lip without pain. Her daughter states she bit her mouth a few times 2-3 weeks ago, and she thought the lesion was a bite, but it worsened and would not gone away.  She was seen at urgent care on 9/26 and given mouthwash to try and treat the lesion, it has helped. She wears dentures. Negative for associated fever,sore throat,or gum bleeding.  Diabetes Mellitus II:  She has not been checking BS's. She is on Lantus at 15 units and NovoLog on a sliding scale.  Negative for symptoms of hypoglycemia, polyuria, polydipsia, foot ulcers/trauma. + Peripheral neuropathy with burning and numbness LE's/feet. Last A1C in 03/2022 was 5.7.  HTN on Imdur 30 mg daily, Losartan 50 mg daily, Hydralazine 25 mg tid,Amlodipine 10 mg daily, spironolactone 50 mg daily., and Metoprolol Tartrate 50 mg bid. Not checking BP's Denies severe/frequent headache, visual changes, chest pain, dyspnea, palpitation, focal weakness, or edema. CKD III: Negative for gross hematuria,foam in urine,or decreased urine output. Cr 1.2-1.4 and e GFR high 30's-40's.  Her daughter notes they will be moving to [place], [place] next week.   Review of Systems  Constitutional:  Positive for fatigue. Negative for activity change, appetite change and fever.  HENT:  Negative for nosebleeds and sore throat.   Respiratory:  Negative for cough and wheezing.   Gastrointestinal:  Negative for abdominal pain, nausea and vomiting.  Musculoskeletal:  Positive for gait problem.  Skin:  Negative for rash.   Neurological:  Negative for syncope and facial asymmetry.  Psychiatric/Behavioral:  Negative for hallucinations. The patient is not nervous/anxious.   See other pertinent positives and negatives in HPI.  Current Outpatient Medications on File Prior to Visit  Medication Sig Dispense Refill   albuterol (VENTOLIN HFA) 108 (90 Base) MCG/ACT inhaler Inhale 2 puffs into the lungs every 6 (six) hours as needed for wheezing or shortness of breath. 1 each 6   amLODipine (NORVASC) 10 MG tablet TAKE ONE TABLET BY MOUTH EVERY DAY 90 tablet 1   ASPIRIN LOW DOSE 81 MG tablet TAKE ONE TABLET BY MOUTH EVERY DAY 30 tablet 11   budesonide-formoterol (SYMBICORT) 160-4.5 MCG/ACT inhaler Inhale 2 puffs into the lungs 2 (two) times daily. 10.2 g 3   Cholecalciferol (VITAMIN D3) 50 MCG (2000 UT) TABS TAKE ONE CAPSULE BY MOUTH EVERY DAY 30 tablet 11   clopidogrel (PLAVIX) 75 MG tablet TAKE ONE TABLET BY MOUTH EVERY DAY 30 tablet 5   ezetimibe (ZETIA) 10 MG tablet TAKE ONE TABLET BY MOUTH EVERY DAY 30 tablet 2   FEROSUL 325 (65 Fe) MG tablet TAKE ONE TABLET BY MOUTH EVERY OTHER DAY (EVERY TWO DAYS) WITH FOUR OUNCES ORANGE JUICE 100 tablet 11   glucose blood (FREESTYLE PRECISION NEO TEST) test strip Use to check blood sugars 1-2 times daily. Dx:E11.9 100 each 12   glucose blood (TRUE METRIX BLOOD GLUCOSE TEST) test strip Use to test blood sugars 1-2 times daily. Dx:E11.9 200 each 12   GNP PAIN RELIEF EX-STRENGTH 500 MG tablet  TAKE ONE TABLET BY MOUTH FOUR TIMES DAILY 120 tablet 0   hydrALAZINE (APRESOLINE) 25 MG tablet TAKE ONE TABLET BY MOUTH EVERY EIGHT HOURS 90 tablet 3   Insulin Aspart FlexPen (NOVOLOG) 100 UNIT/ML INJECT subcutaneously THREE TIMES DAILY PER sliding scales BLOOD SUGAR (150 NO INSULIN)+ 151-180 Give FOUR units. 181-220 Give SIX units, 221-260 Give EIGHT units, 261-300 Give 10 units 301-340 Give 12 units AND > 341 Give 14 units. Max dose of 36 units. 15 mL 3   insulin glargine (LANTUS SOLOSTAR) 100  UNIT/ML Solostar Pen INJECT 20 units AT BEDTIME 15 mL 1   Insulin Pen Needle (PEN NEEDLES) 32G X 4 MM MISC To use with both insulins. 200 each 3   isosorbide mononitrate (IMDUR) 30 MG 24 hr tablet TAKE ONE TABLET BY MOUTH EVERY DAY 30 tablet 5   lamoTRIgine (LAMICTAL) 200 MG tablet TAKE 1.5 TABLETS BY MOUTH TWICE DAILY 270 tablet 3   losartan (COZAAR) 50 MG tablet TAKE ONE TABLET BY MOUTH EVERY DAY 30 tablet 2   metoprolol tartrate (LOPRESSOR) 50 MG tablet Take 1 tablet (50 mg total) by mouth 2 (two) times daily. 60 tablet 5   Omega-3 Fatty Acids (FISH OIL) 1000 MG CAPS TAKE TWO CAPSULES BY MOUTH EVERY DAY 60 capsule 5   phenytoin (DILANTIN) 100 MG ER capsule Take 3 capsules (300 mg total) by mouth daily. (Take with 30mg  tablet for total of 330mg  every night) 270 capsule 3   phenytoin (DILANTIN) 30 MG ER capsule Take 1 capsule (30 mg total) by mouth at bedtime (take with Dilantin 300mg  for total of 330mg  every night) 90 capsule 3   polyvinyl alcohol (ARTIFICIAL TEARS) 1.4 % ophthalmic solution Place 1 drop into both eyes in the morning and at bedtime. 15 mL 6   rosuvastatin (CRESTOR) 40 MG tablet TAKE ONE TABLET BY MOUTH EVERY DAY 30 tablet 5   Spacer/Aero Chamber Mouthpiece MISC 1 Device by Does not apply route daily as needed. 1 each 0   SPIRIVA HANDIHALER 18 MCG inhalation capsule place ONE capsule into inhaler AND inhale EVERY DAY 30 capsule 5   spironolactone (ALDACTONE) 50 MG tablet TAKE ONE TABLET BY MOUTH EVERY DAY 90 tablet 1   tiZANidine (ZANAFLEX) 4 MG tablet TAKE 1/2 TABLET BY MOUTH EVERY 12 HOURS AS NEEDED muscle SPASMS 30 tablet 3   ipratropium-albuterol (DUONEB) 0.5-2.5 (3) MG/3ML SOLN Take 3 mLs by nebulization 2 (two) times daily. (Patient not taking: Reported on 11/24/2021) 180 mL 0   No current facility-administered medications on file prior to visit.    Past Medical History:  Diagnosis Date   Allergy    Arthritis    Bipolar 1 disorder (HCC)    Blood transfusion without  reported diagnosis    CAD (coronary artery disease) 04/17/2015   Carotid artery stenosis    50-69% bilateral ICA stenoses by velocity criteria but no visible plaque and ICA/CCA ratio 1.72 on right and 1.38 on left   Chronic airway obstruction, not elsewhere classified 03/02/2010   Overview:  Chronic Obstructive Pulmonary Disease   CKD (chronic kidney disease) stage 3, GFR 30-59 ml/min (HCC) 09/18/2021   Coronary artery disease    Depression    Diabetes mellitus without complication (HCC)    Echocardiogram    Echo 06/2018: Hyperdynamic systolic function, EF >65, mild concentric LVH, elevated LVEDP (E/E' suggests impaired relaxation), mild AI, lipomatous interatrial septum   Hypertension    Left adrenal mass (HCC)    2.5 cm L adrenal mass  on CT per note of Dr. Ledell Peoples 05/20/2014   MI, old    PAD (peripheral artery disease) (HCC)    03/2014 - CT aortogram with lower extremity runoff (mild to moderate disease in common iliac arteries, complete occulusion of her bilateral external iliac arteries with heavily disease common femoral arteries. She also has disease to her SFAs bilaterally. Popliteal arteries and tibial vessel runoff appears to be adequate)   Seizures (HCC)    Stroke (HCC)    Allergies  Allergen Reactions   Amoxicillin Other (See Comments)    colitis   Augmentin [Amoxicillin-Pot Clavulanate] Other (See Comments)    colitis   Cephalexin Other (See Comments)    Colitis - ceftriaxone ok    Social History   Socioeconomic History   Marital status: Widowed    Spouse name: Not on file   Number of children: 2   Years of education: Not on file   Highest education level: Not on file  Occupational History   Not on file  Tobacco Use   Smoking status: Former    Current packs/day: 0.00    Types: Cigarettes    Quit date: 2015    Years since quitting: 9.7   Smokeless tobacco: Never  Vaping Use   Vaping status: Never Used  Substance and Sexual Activity   Alcohol use: No   Drug use:  No   Sexual activity: Not on file  Other Topics Concern   Not on file  Social History Narrative   Pt lives in assisted living facility   Has 2 adult children, she is widowed, husband died in his early 33's in a work related accident   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   CAFFEINE 1/2  COFFEE   Right handed   One level    retired   International aid/development worker of Corporate investment banker Strain: Low Risk  (11/27/2019)   Overall Financial Resource Strain (CARDIA)    Difficulty of Paying Living Expenses: Not hard at all  Food Insecurity: No Food Insecurity (10/05/2021)   Hunger Vital Sign    Worried About Running Out of Food in the Last Year: Never true    Ran Out of Food in the Last Year: Never true  Transportation Needs: No Transportation Needs (10/05/2021)   PRAPARE - Administrator, Civil Service (Medical): No    Lack of Transportation (Non-Medical): No  Physical Activity: Inactive (11/27/2019)   Exercise Vital Sign    Days of Exercise per Week: 0 days    Minutes of Exercise per Session: 0 min  Stress: No Stress Concern Present (11/27/2019)   [employer] of Occupational Health - Occupational Stress Questionnaire    Feeling of Stress : Not at all  Social Connections: Socially Isolated (11/27/2019)   Social Connection and Isolation Panel [NHANES]    Frequency of Communication with Friends and Family: More than three times a week    Frequency of Social Gatherings with Friends and Family: More than three times a week    Attends Religious Services: Never    Database administrator or Organizations: No    Attends Banker Meetings: Never    Marital Status: Widowed   Today's Vitals   11/01/22 1101  BP: 120/70  Pulse: 70  Resp: 16  SpO2: 97%  Height: 5\' 3"  (1.6 m)   Body mass index is 23.38 kg/m.  Physical Exam Vitals and nursing note reviewed.  Constitutional:  General: She is not in acute distress.    Appearance: She is well-developed.   HENT:     Head: Normocephalic and atraumatic.     Mouth/Throat:     Mouth: Mucous membranes are moist. Oral lesions (~2-3 mm papular lesion on lower lip. See picture) present.     Pharynx: Oropharynx is clear.  Eyes:     Conjunctiva/sclera: Conjunctivae normal.  Cardiovascular:     Rate and Rhythm: Normal rate and regular rhythm.     Heart sounds: No murmur heard.    Comments: PT pulses palpable. Pulmonary:     Effort: Pulmonary effort is normal. No respiratory distress.     Breath sounds: Normal breath sounds.  Abdominal:     Palpations: Abdomen is soft. There is no mass.     Tenderness: There is no abdominal tenderness.  Musculoskeletal:     Right lower leg: No edema.     Left lower leg: No edema.  Lymphadenopathy:     Cervical: No cervical adenopathy.  Skin:    General: Skin is warm.     Findings: No erythema or rash.  Neurological:     General: No focal deficit present.     Mental Status: She is alert. Mental status is at baseline.     Comments: In a wheel chair.  Psychiatric:        Mood and Affect: Mood and affect normal.    ASSESSMENT AND PLAN:  Ms. Karis was seen today for a lesion inside her lower lip.  Lab Results  Component Value Date   HGBA1C 5.8 11/01/2022   Lab Results  Component Value Date   NA 134 (L) 11/01/2022   CL 102 11/01/2022   K 3.9 11/01/2022   CO2 26 11/01/2022   BUN 21 11/01/2022   CREATININE 0.95 11/01/2022   GFR 58.03 (L) 11/01/2022   CALCIUM 9.8 11/01/2022   PHOS 3.1 10/02/2021   ALBUMIN 2.9 (L) 02/25/2022   GLUCOSE 125 (H) 11/01/2022   Lab Results  Component Value Date   MICROALBUR 23.6 (H) 11/01/2022   MICROALBUR 19.8 (H) 09/18/2021   Oral mucosal lesion We discussed possible Dx, lesion has benign characteristics, ? Mucocele.Her daughter has picture of lesion when fist started and it has improved. She does not think her dentist will remove lesion, so appt with oral/maxillofacial surgeon will be arranged.  -      Ambulatory referral to Oral Maxillofacial Surgery  Diabetes mellitus type 2 with neurological manifestations [facility]) Assessment & Plan: Problem has been well controlled. Continue Lantus  15 U and Novolin per sliding scale. Annual eye exam and foot care to continue. F/U in 6 months with new PCP in [place].  Orders: -     Microalbumin / creatinine urine ratio; Future -     Basic metabolic panel; Future -     Hemoglobin A1c; Future  Stage 3a chronic kidney disease (HCC) Assessment & Plan: Stable. Cr 1.2-1.4 and e GFR 40's. Continue low salt diet, adequate hydration,and avoidance of NSAID's. We discussed the importance of  BP and glucose controlled.  Orders: -     Basic metabolic panel; Future  Hypertension with heart disease Assessment & Plan: BP adequately controlled. Continue Amlodipine 10 mg daily, Imdur 30 mg daily,Losartan 50 mg daily,Metoprolol  tartrate 50 mg bid,Hydralazine 25 mg tid,, spironolactone 25 mg daily as well as low salt diet. Monitor BP at home.   Return in 6 months (on 05/02/2023), or if symptoms worsen or fail to improve,  for chronic problems with new PCP in [place].  I, Rolla Etienne Wierda, acting as a scribe for Josephine Rudnick [place], MD., have documented all relevant documentation on the behalf of Shaguana Love [place], MD, as directed by  Drena Ham [place], MD while in the presence of Lamar Naef [place], MD.   I, Juanice Warburton [place], MD, have reviewed all documentation for this visit. The documentation on 11/02/22 for the exam, diagnosis, procedures, and orders are all accurate and complete.  Marteze Vecchio G. [place], MD  [facility]. [place] office.

## 2022-11-02 ENCOUNTER — Encounter: Payer: Self-pay | Admitting: Family Medicine

## 2022-11-02 LAB — MICROALBUMIN / CREATININE URINE RATIO
Creatinine,U: 64.1 mg/dL
Microalb Creat Ratio: 36.8 mg/g — ABNORMAL HIGH (ref 0.0–30.0)
Microalb, Ur: 23.6 mg/dL — ABNORMAL HIGH (ref 0.0–1.9)

## 2022-11-02 NOTE — Assessment & Plan Note (Signed)
Stable. Cr 1.2-1.4 and e GFR 40's. Continue low salt diet, adequate hydration,and avoidance of NSAID's. We discussed the importance of  BP and glucose controlled.

## 2022-11-02 NOTE — Assessment & Plan Note (Signed)
BP adequately controlled. Continue Amlodipine 10 mg daily, Imdur 30 mg daily,Losartan 50 mg daily,Metoprolol  tartrate 50 mg bid,Hydralazine 25 mg tid,, spironolactone 25 mg daily as well as low salt diet. Monitor BP at home.

## 2022-11-02 NOTE — Assessment & Plan Note (Signed)
Problem has been well controlled. Continue Lantus  15 U and Novolin per sliding scale. Annual eye exam and foot care to continue. F/U in 6 months with new PCP in [place] .

## 2022-11-03 ENCOUNTER — Other Ambulatory Visit: Payer: Self-pay

## 2022-11-03 ENCOUNTER — Other Ambulatory Visit: Payer: Self-pay | Admitting: Neurology

## 2022-11-03 DIAGNOSIS — E1149 Type 2 diabetes mellitus with other diabetic neurological complication: Secondary | ICD-10-CM

## 2022-11-03 MED ORDER — LANTUS SOLOSTAR 100 UNIT/ML ~~LOC~~ SOPN: 8.0000 [IU] | PEN_INJECTOR | Freq: Every day | SUBCUTANEOUS | Status: DC

## 2022-11-03 MED ORDER — DAPAGLIFLOZIN PROPANEDIOL 10 MG PO TABS: 10.0000 mg | ORAL_TABLET | Freq: Every day | ORAL | 3 refills | Status: DC

## 2022-11-09 DIAGNOSIS — K1370 Unspecified lesions of oral mucosa: Secondary | ICD-10-CM | POA: Diagnosis not present

## 2022-11-26 ENCOUNTER — Ambulatory Visit: Payer: HMO | Admitting: Neurology

## 2022-11-29 ENCOUNTER — Other Ambulatory Visit: Payer: Self-pay | Admitting: Neurology

## 2022-12-01 ENCOUNTER — Telehealth: Payer: Self-pay | Admitting: Neurology

## 2022-12-01 ENCOUNTER — Other Ambulatory Visit: Payer: Self-pay | Admitting: Family Medicine

## 2022-12-01 DIAGNOSIS — I10 Essential (primary) hypertension: Secondary | ICD-10-CM

## 2022-12-01 DIAGNOSIS — I119 Hypertensive heart disease without heart failure: Secondary | ICD-10-CM

## 2022-12-01 MED ORDER — PHENYTOIN SODIUM EXTENDED 30 MG PO CAPS: ORAL_CAPSULE | ORAL | 0 refills | Status: DC

## 2022-12-01 MED ORDER — PHENYTOIN SODIUM EXTENDED 100 MG PO CAPS: 300.0000 mg | ORAL_CAPSULE | Freq: Every day | ORAL | 0 refills | Status: DC

## 2022-12-01 MED ORDER — ISOSORBIDE MONONITRATE ER 30 MG PO TB24: 30.0000 mg | ORAL_TABLET | Freq: Every day | ORAL | 1 refills | Status: AC

## 2022-12-01 MED ORDER — DAPAGLIFLOZIN PROPANEDIOL 10 MG PO TABS: 10.0000 mg | ORAL_TABLET | Freq: Every day | ORAL | 1 refills | Status: AC

## 2022-12-01 MED ORDER — HYDRALAZINE HCL 25 MG PO TABS: 25.0000 mg | ORAL_TABLET | Freq: Three times a day (TID) | ORAL | 3 refills | Status: AC

## 2022-12-01 MED ORDER — ROSUVASTATIN CALCIUM 40 MG PO TABS: 40.0000 mg | ORAL_TABLET | Freq: Every day | ORAL | 1 refills | Status: AC

## 2022-12-01 NOTE — Telephone Encounter (Signed)
Phar is still waiting for dapagliflozin propanediol (FARXIGA) 10 MG TABS tablet ,hydrALAZINE (APRESOLINE) 25 MG tablet , isosorbide mononitrate (IMDUR) 30 MG 24 hr tablet , rosuvastatin (CRESTOR) 40 MG tablet   [facility] - [place], [place] - [address]  [employer] [employer] Phone: [phone]  Fax: [phone]

## 2022-12-01 NOTE — Addendum Note (Signed)
Addended by: Weyman Croon E on: 12/01/2022 12:50 PM   Modules accepted: Orders

## 2022-12-01 NOTE — Telephone Encounter (Signed)
Requesting 90d supply for losartan (COZAAR) 50 MG tablet metoprolol tartrate (LOPRESSOR) 50 MG tablet isosorbide mononitrate (IMDUR) 30 MG 24 hr tablet rosuvastatin (CRESTOR) 40 MG tablet  amLODipine (NORVASC) 10 MG tablet  hydrALAZINE (APRESOLINE) 25 MG tablet  dapagliflozin propanediol (FARXIGA) 10 MG TABS tablet ezetimibe (ZETIA) 10 MG tablet  [facility] - [place], [place] - [address]  [address] [employer] [address] Phone: [phone]  Fax: [phone]

## 2022-12-01 NOTE — Telephone Encounter (Signed)
1. Which medications need to be refilled? (please list name of each medication and dose if known) phenytoin (DILANTIN) 30 MG ER capsule [[id number]] & phenytoin (DILANTIN) 100 MG ER capsule [[id number]]   2. Which pharmacy/location (including street and city if local pharmacy) is medication to be sent to? [facility] - [place], [place] - [address]  [address]  3. Do they need a 30 day or 90 day supply? 90

## 2022-12-01 NOTE — Telephone Encounter (Signed)
One month supply sent in for pt, pt needs an appointment for more refills

## 2023-01-27 ENCOUNTER — Telehealth: Payer: Self-pay | Admitting: Neurology

## 2023-01-27 MED ORDER — LAMOTRIGINE 200 MG PO TABS: ORAL_TABLET | ORAL | 0 refills | Status: DC

## 2023-01-27 MED ORDER — PHENYTOIN SODIUM EXTENDED 100 MG PO CAPS: 300.0000 mg | ORAL_CAPSULE | Freq: Every day | ORAL | 0 refills | Status: DC

## 2023-01-27 MED ORDER — PHENYTOIN SODIUM EXTENDED 30 MG PO CAPS: ORAL_CAPSULE | ORAL | 0 refills | Status: DC

## 2023-01-27 NOTE — Telephone Encounter (Signed)
 Patient made an appt for 01/31/23. She has moved to [place].   She needs a refill on her Dilantin and Lamictal   Pharmacy- [facility] pharmacy

## 2023-01-27 NOTE — Telephone Encounter (Signed)
 Refills sent to [facility]

## 2023-01-31 ENCOUNTER — Ambulatory Visit: Payer: PPO | Admitting: Neurology

## 2023-02-21 ENCOUNTER — Telehealth: Payer: Self-pay

## 2023-02-21 ENCOUNTER — Ambulatory Visit: Payer: Self-pay | Admitting: Family Medicine

## 2023-02-21 NOTE — Telephone Encounter (Signed)
Noted

## 2023-02-21 NOTE — Telephone Encounter (Signed)
   Chief Complaint: SOB, confusion, choking Symptoms: SOB with activity, choking with regular food Frequency: constant  Disposition: [] ED /[] Urgent Care (no appt availability in office) / [x] Appointment(In office/virtual)/ []  [facility]/ [] Home Care/ [] Refused Recommended Disposition /[] [facility]/ []  Follow-up with PCP Additional Notes: Daughter Augusto Gamble calling on pt behalf needing follow-up to set up a new palliative care  due to move. Pt has had SOB and seems to effect pt only while being active. Jody stated she has noticed pt choking more while eating and has increase in confusion.   Pt now lives 3 hours from PCP and already had appt for 1/30 with another practice and requested to be see that day too. Pt has appt for 1/30 @1030 . RN gave care advice and pt verbalized understanding.        Copied from CRM [id number]. Topic: Clinical - Red Word Triage >> Feb 21, 2023 11:39 AM Steele Sizer wrote: Red Word that prompted transfer to Nurse Triage: Pt is needing to be scheduled for a f/u appointment however she is experiencing shortness of breath, trouble swallowing, and confusion. Pt may need a swallow test done. Reason for Disposition  [1] MODERATE longstanding difficulty breathing (e.g., speaks in phrases, SOB even at rest, pulse 100-120) AND [2] SAME as normal  Answer Assessment - Initial Assessment Questions 1. RESPIRATORY STATUS: "Describe your breathing?" (e.g., wheezing, shortness of breath, unable to speak, severe coughing)      SOB with activity  2. ONSET: "When did this breathing problem begin?"      "Quite some time"  3. PATTERN "Does the difficult breathing come and go, or has it been constant since it started?"      Comes and goes with activity  4. SEVERITY: "How bad is your breathing?" (e.g., mild, moderate, severe)    - MILD: No SOB at rest, mild SOB with walking, speaks normally in sentences, can lie down, no retractions, pulse < 100.    - MODERATE: SOB at  rest, SOB with minimal exertion and prefers to sit, cannot lie down flat, speaks in phrases, mild retractions, audible wheezing, pulse 100-120.    - SEVERE: Very SOB at rest, speaks in single words, struggling to breathe, sitting hunched forward, retractions, pulse > 120      mild 5. RECURRENT SYMPTOM: "Have you had difficulty breathing before?" If Yes, ask: "When was the last time?" and "What happened that time?"      Yes, " for quite some time"  6. CARDIAC HISTORY: "Do you have any history of heart disease?" (e.g., heart attack, angina, bypass surgery, angioplasty)      Vascular disease 7. LUNG HISTORY: "Do you have any history of lung disease?"  (e.g., pulmonary embolus, asthma, emphysema)     COPD 8. CAUSE: "What do you think is causing the breathing problem?"      unsure 9. OTHER SYMPTOMS: "Do you have any other symptoms? (e.g., dizziness, runny nose, cough, chest pain, fever)     Thinks COPD 10. O2 SATURATION MONITOR:  "Do you use an oxygen saturation monitor (pulse oximeter) at home?" If Yes, ask: "What is your reading (oxygen level) today?" "What is your usual oxygen saturation reading?" (e.g., 95%)       na  Protocols used: Breathing Difficulty-A-AH

## 2023-02-21 NOTE — Telephone Encounter (Signed)
Patient is coming into the office on 1/30.

## 2023-02-21 NOTE — Telephone Encounter (Signed)
Copied from CRM [id number]. Topic: General - Other >> Feb 21, 2023 11:34 AM Steele Sizer wrote: Reason for CRM: Lesly Rubenstein daughter called and would like to request clinical notes to be sent over to the [place] office regarding the pt palliative care fax number is [phone] please attention to palliative care

## 2023-02-22 ENCOUNTER — Encounter: Payer: Self-pay | Admitting: Neurology

## 2023-02-22 ENCOUNTER — Telehealth (INDEPENDENT_AMBULATORY_CARE_PROVIDER_SITE_OTHER): Payer: Medicare Other | Admitting: Neurology

## 2023-02-22 VITALS — Ht 69.0 in | Wt 128.0 lb

## 2023-02-22 DIAGNOSIS — G40209 Localization-related (focal) (partial) symptomatic epilepsy and epileptic syndromes with complex partial seizures, not intractable, without status epilepticus: Secondary | ICD-10-CM

## 2023-02-22 DIAGNOSIS — F0150 Vascular dementia without behavioral disturbance: Secondary | ICD-10-CM | POA: Diagnosis not present

## 2023-02-22 MED ORDER — PHENYTOIN SODIUM EXTENDED 100 MG PO CAPS: 300.0000 mg | ORAL_CAPSULE | Freq: Every day | ORAL | 2 refills | Status: DC

## 2023-02-22 MED ORDER — LAMOTRIGINE 200 MG PO TABS: ORAL_TABLET | ORAL | 2 refills | Status: AC

## 2023-02-22 MED ORDER — PHENYTOIN SODIUM EXTENDED 30 MG PO CAPS: ORAL_CAPSULE | ORAL | 2 refills | Status: AC

## 2023-02-22 NOTE — Patient Instructions (Signed)
Good to see you! Refills have been sent for your seizure medications. Agree with swallow evaluation and joining [employer] activities. Wishing you all the best! Follow-up as needed, call for any changes.     Seizure Precautions: 1. If medication has been prescribed for you to prevent seizures, take it exactly as directed.  Do not stop taking the medicine without talking to your doctor first, even if you have not had a seizure in a long time.   2. Avoid activities in which a seizure would cause danger to yourself or to others.  Don't operate dangerous machinery, swim alone, or climb in high or dangerous places, such as on ladders, roofs, or girders.  Do not drive unless your doctor says you may.  3. If you have any warning that you may have a seizure, lay down in a safe place where you can't hurt yourself.    4.  No driving for 6 months from last seizure, as per [facility].   Please refer to the following link on the Epilepsy Foundation of America's website for more information: http://www.epilepsyfoundation.org/answerplace/Social/driving/drivingu.cfm   5.  Maintain good sleep hygiene. Avoid alcohol.  6.  Contact your doctor if you have any problems that may be related to the medicine you are taking.  7.  Call 911 and bring the patient back to the ED if:        A.  The seizure lasts longer than 5 minutes.       B.  The patient doesn't awaken shortly after the seizure  C.  The patient has new problems such as difficulty seeing, speaking or moving  D.  The patient was injured during the seizure  E.  The patient has a temperature over 102 F (39C)  F.  The patient vomited and now is having trouble breathing

## 2023-02-22 NOTE — Progress Notes (Signed)
Virtual Visit via Video Note The purpose of this virtual visit is to provide medical care while limiting exposure to the novel coronavirus.    Consent was obtained for video visit:  Yes.   Answered questions that patient had about telehealth interaction:  Yes.     Pt location: Home Physician Location: office Name of referring provider:  [place], Betty G, MD I connected with Milus Height at patients initiation/request on 02/22/2023 at  4:00 PM EST by video enabled telemedicine application and verified that I am speaking with the correct person using two identifiers. Pt MRN:  [id number] Pt DOB:  [birth year] Video Participants:  Josefine Class   History of Present Illness:  The patient had a virtual video visit on 02/22/2023. She was last seen in the neurology clinic in October 2023 for seizures and dementia. Her daughter Lesly Rubenstein is present to provide history. Since her last visit, she moved in with Lesly Rubenstein last March 2024 to live in [place]. She will be seeing her new PCP in February. THey have the Palliative Care to come by. She has not had any seizures or seizure-like symptoms since 2016 on Lamotrigine 200mg  1.5 tabs BID (300mg  BID) and Phenytoin 330mg  daily, no side effects. Dementia has continued to progress, she needs assistance with all ADLs. She needs help to get in the tub and needs instructions when washing herself. She is getting a little more confused off and on in the daytime. No wandering behaviors. One time they ate out and she took the straw out of the drink can saying "I'm trying to make it work, I can't seem to make it work." Her daughter manages finances, medications, meals. She goes to the [employer] 2-3 times a week. They have painters tape on the floor to help guide her to the bathroom. She denies any headaches but feels like her brain "always sloshes around." No focal numbness/tingling/weakness. Lesly Rubenstein has noticed her head starts to bob forward more in the middle  of the day, Lesly Rubenstein touches the back of her head as a reminder which helps. She has had some swallowing issues, some days she can eat a burger or chicken without issue, other days she eats soup and noodles.    History on Initial Assessment 03/18/2017: This is a pleasant 78 year old ambidextrous right-hand dominant woman with a history of hypertension, diabetes, peripheral vascular disease, COPD, sleep apnea on CPAP, CAD, bipolar disorder, stroke in 2015 with subsequent seizures, presenting to establish care. She had previously been seeing neurologist Dr. Jackquline Bosch in [place], [place], records unavailable for review. She and her daughter report that she had a stroke in 2015 which mostly affected her vision and memory. She cannot read. She can write her name and sometimes a word, but something would write a different word. The first seizure occurred in 2016, she recalls that everything got loud and mixed up, she tried calling her son but per daughter was "hollering out loud" then became unconscious. They report possibly only 2 seizures at that time, no seizures since 2016. She is taking Lamotrigine 200mg  1.5 tabs BID and Dilantin 330mg  qhs with no side effects. She had previously been living with her daughter, but due to her daughter medical issues, the patient opted to move to [place] last year. She has had memory issues since the stroke, her daughter manages bills. Medications are administered at [employer]. She does not drive. She reports bilateral leg pain and deep pain radiating to her  lower back. She also has a diagnosis of sciatica, "I think the left one." She has peripheral artery disease and sees Vascular.    She denies any olfactory/gustatory hallucinations, deja vu, rising epigastric sensation, focal numbness/tingling/weakness, myoclonic jerks. She denies any headaches, dizziness, diplopia, neck/back pain, bowel/bladder dysfunction.    Epilepsy Risk Factors:  History of large right posterior  temporal and medial occipital infarct with encephalomalacia and gliosis. Otherwise she had a normal birth and early development.  There is no history of febrile convulsions, CNS infections such as meningitis/encephalitis, significant traumatic brain injury, neurosurgical procedures, or family history of seizures.   Current Outpatient Medications on File Prior to Visit  Medication Sig Dispense Refill   albuterol (VENTOLIN HFA) 108 (90 Base) MCG/ACT inhaler Inhale 2 puffs into the lungs every 6 (six) hours as needed for wheezing or shortness of breath. 1 each 6   amLODipine (NORVASC) 10 MG tablet TAKE ONE TABLET BY MOUTH EVERY DAY 90 tablet 1   ASPIRIN LOW DOSE 81 MG tablet TAKE ONE TABLET BY MOUTH EVERY DAY 30 tablet 11   budesonide-formoterol (SYMBICORT) 160-4.5 MCG/ACT inhaler Inhale 2 puffs into the lungs 2 (two) times daily. 10.2 g 3   Cholecalciferol (VITAMIN D3) 50 MCG (2000 UT) TABS TAKE ONE CAPSULE BY MOUTH EVERY DAY 30 tablet 11   dapagliflozin propanediol (FARXIGA) 10 MG TABS tablet Take 1 tablet (10 mg total) by mouth daily before breakfast. 90 tablet 1   ezetimibe (ZETIA) 10 MG tablet TAKE ONE TABLET BY MOUTH EVERY DAY 90 tablet 1   FEROSUL 325 (65 Fe) MG tablet TAKE ONE TABLET BY MOUTH EVERY OTHER DAY (EVERY TWO DAYS) WITH FOUR OUNCES ORANGE JUICE 100 tablet 11   GNP PAIN RELIEF EX-STRENGTH 500 MG tablet TAKE ONE TABLET BY MOUTH FOUR TIMES DAILY (Patient taking differently: Take 500 mg by mouth every 4 (four) hours as needed.) 120 tablet 0   hydrALAZINE (APRESOLINE) 25 MG tablet Take 1 tablet (25 mg total) by mouth every 8 (eight) hours. 270 tablet 3   Insulin Pen Needle (PEN NEEDLES) 32G X 4 MM MISC To use with both insulins. 200 each 3   isosorbide mononitrate (IMDUR) 30 MG 24 hr tablet Take 1 tablet (30 mg total) by mouth daily. 90 tablet 1   lamoTRIgine (LAMICTAL) 200 MG tablet TAKE 1.5 TABLETS BY MOUTH TWICE DAILY 90 tablet 0   losartan (COZAAR) 50 MG tablet TAKE ONE TABLET BY MOUTH  EVERY DAY 90 tablet 1   metoprolol tartrate (LOPRESSOR) 50 MG tablet TAKE ONE TABLET BY MOUTH TWICE DAILY 180 tablet 1   Omega-3 Fatty Acids (FISH OIL) 1000 MG CAPS TAKE TWO CAPSULES BY MOUTH EVERY DAY 60 capsule 5   phenytoin (DILANTIN) 100 MG ER capsule Take 3 capsules (300 mg total) by mouth daily. (Take with 30mg  tablet for total of 330mg  every night) 90 capsule 0   phenytoin (DILANTIN) 30 MG ER capsule Take 1 capsule (30 mg total) by mouth at bedtime (take with Dilantin 300mg  for total of 330mg  every night) 30 capsule 0   polyvinyl alcohol (ARTIFICIAL TEARS) 1.4 % ophthalmic solution Place 1 drop into both eyes in the morning and at bedtime. 15 mL 6   rosuvastatin (CRESTOR) 40 MG tablet Take 1 tablet (40 mg total) by mouth daily. 90 tablet 1   Spacer/Aero Chamber Mouthpiece MISC 1 Device by Does not apply route daily as needed. 1 each 0   SPIRIVA HANDIHALER 18 MCG inhalation capsule place ONE capsule into inhaler  AND inhale EVERY DAY 30 capsule 5   spironolactone (ALDACTONE) 50 MG tablet TAKE ONE TABLET BY MOUTH EVERY DAY 90 tablet 1   tiZANidine (ZANAFLEX) 4 MG tablet TAKE 1/2 TABLET BY MOUTH EVERY 12 HOURS AS NEEDED muscle SPASMS 30 tablet 3   clopidogrel (PLAVIX) 75 MG tablet TAKE ONE TABLET BY MOUTH EVERY DAY (Patient not taking: Reported on 02/22/2023) 30 tablet 5   glucose blood (FREESTYLE PRECISION NEO TEST) test strip Use to check blood sugars 1-2 times daily. Dx:E11.9 (Patient not taking: Reported on 02/22/2023) 100 each 12   glucose blood (TRUE METRIX BLOOD GLUCOSE TEST) test strip Use to test blood sugars 1-2 times daily. Dx:E11.9 (Patient not taking: Reported on 02/22/2023) 200 each 12   Insulin Aspart FlexPen (NOVOLOG) 100 UNIT/ML INJECT subcutaneously THREE TIMES DAILY PER sliding scales BLOOD SUGAR (150 NO INSULIN)+ 151-180 Give FOUR units. 181-220 Give SIX units, 221-260 Give EIGHT units, 261-300 Give 10 units 301-340 Give 12 units AND > 341 Give 14 units. Max dose of 36 units.  (Patient not taking: Reported on 02/22/2023) 15 mL 3   insulin glargine (LANTUS SOLOSTAR) 100 UNIT/ML Solostar Pen Inject 8 Units into the skin daily. (Patient not taking: Reported on 02/22/2023)     ipratropium-albuterol (DUONEB) 0.5-2.5 (3) MG/3ML SOLN Take 3 mLs by nebulization 2 (two) times daily. (Patient not taking: Reported on 11/24/2021) 180 mL 0   No current facility-administered medications on file prior to visit.     Observations/Objective:   Vitals:   02/22/23 1542  Weight: 128 lb (58.1 kg)  Height: 5\' 9"  (1.753 m)   GEN:  The patient appears stated age and is in NAD.  Neurological examination: Patient is awake, alert. No aphasia or dysarthria. Intact fluency and comprehension. Cranial nerves: Extraocular movements intact. No facial asymmetry. Motor: moves all extremities symmetrically, at least anti-gravity x 4. No incoordination on finger to nose testing. Gait: narrow-based and steady, no ataxia.    Assessment and Plan:   This is a 78 yo RH woman with a history of hypertension, diabetes, peripheral vascular disease, COPD, sleep apnea (refuses CPAP) CAD, bipolar disorder, stroke in 2015 with subsequent seizures and vascular dementia. She has been seizure-free since 2016 on Phenytoin 330mg  daily and Lamotrigine 200mg  1.5 tabs BID (300mg  BID), refills sent. They will discuss swallowing issues with her PCP. Continue 24/7 care, encouraged to join [employer] activities. Since they now live in [place], she will establish care locally. Follow-up as needed, call for any changes.    Follow Up Instructions:   -I discussed the assessment and treatment plan with the patient's daughter. The patient's daughter was provided an opportunity to ask questions and all were answered. The patient's daughter agreed with the plan and demonstrated an understanding of the instructions.   The patient's daughter was advised to call back or seek an in-person evaluation if the symptoms worsen or if the  condition fails to improve as anticipated.     Van Clines, MD

## 2023-02-24 ENCOUNTER — Ambulatory Visit (INDEPENDENT_AMBULATORY_CARE_PROVIDER_SITE_OTHER): Payer: Medicare Other | Admitting: Family Medicine

## 2023-02-24 ENCOUNTER — Telehealth: Payer: Self-pay

## 2023-02-24 ENCOUNTER — Encounter: Payer: Self-pay | Admitting: Family Medicine

## 2023-02-24 ENCOUNTER — Other Ambulatory Visit: Payer: Self-pay

## 2023-02-24 VITALS — BP 168/74 | HR 76 | Temp 97.7°F | Ht 67.0 in | Wt 127.6 lb

## 2023-02-24 DIAGNOSIS — I10 Essential (primary) hypertension: Secondary | ICD-10-CM | POA: Diagnosis not present

## 2023-02-24 DIAGNOSIS — R410 Disorientation, unspecified: Secondary | ICD-10-CM | POA: Diagnosis not present

## 2023-02-24 DIAGNOSIS — R1310 Dysphagia, unspecified: Secondary | ICD-10-CM

## 2023-02-24 DIAGNOSIS — G4733 Obstructive sleep apnea (adult) (pediatric): Secondary | ICD-10-CM | POA: Diagnosis not present

## 2023-02-24 DIAGNOSIS — J449 Chronic obstructive pulmonary disease, unspecified: Secondary | ICD-10-CM

## 2023-02-24 DIAGNOSIS — R0602 Shortness of breath: Secondary | ICD-10-CM

## 2023-02-24 DIAGNOSIS — J441 Chronic obstructive pulmonary disease with (acute) exacerbation: Secondary | ICD-10-CM

## 2023-02-24 DIAGNOSIS — F0390 Unspecified dementia without behavioral disturbance: Secondary | ICD-10-CM

## 2023-02-24 LAB — POCT URINALYSIS DIPSTICK
Bilirubin, UA: NEGATIVE
Blood, UA: NEGATIVE
Glucose, UA: POSITIVE — AB
Ketones, UA: NEGATIVE
Leukocytes, UA: NEGATIVE
Nitrite, UA: NEGATIVE
Protein, UA: POSITIVE — AB
Spec Grav, UA: 1.015 (ref 1.010–1.025)
Urobilinogen, UA: 0.2 U/dL
pH, UA: 6 (ref 5.0–8.0)

## 2023-02-24 NOTE — Progress Notes (Signed)
Established Patient Office Visit   Subjective  Patient ID: Kathryn Lewis, female    DOB: 1945/10/12  Age: 78 y.o. MRN: [id number]  Chief Complaint  Patient presents with   Shortness of Breath    Shortness of breath with exertion, patient would like to know if there is something cheaper Spiriva and Symbicort    Memory Loss    And confusion    Choking    While eating, daughter would like a swallow test   Pt accompanied by her daughter. Patient is a 78 year old female followed with Dr. [place] and seen for follow-up on chronic conditions.  Schedule as an acute visit however symptoms have been ongoing.  Patient and her daughter currently lives in [place], [place].  Per patient's daughter patient with continued memory issues.  Noted to have history of dementia.  Followed by neurology.  Had virtual visit earlier this week.  Daughter notices increased SOB with exertion since December 25.  States patient has shortness of breath with activity such as getting in the car and moving around.  Seems more noticeable in December.  Patient and her daughter recently joined a gym to stay active.  Patient has history of OSA however did not feel like CPAP was helpful so stopped wearing it 3 years ago.  Patient prescribed Spiriva however has been out x 2 weeks as medication is expensive.  Dysphagia noted.  Patient has difficulty swallowing some foods, gets choked.  Does okay with liquids.  Denies cough, fever, chills.  BP elevated in clinic.  States patient just took medication prior to appointment while in the car.    Patient Active Problem List   Diagnosis Date Noted   Dizziness 04/16/2022   Neoplasm of left kidney seen on abdominal CT 02/25/22 04/16/2022   Elevated troponin 10/01/2021   CKD (chronic kidney disease) stage 3, GFR 30-59 ml/min (HCC) 09/18/2021   Dementia without behavioral disturbance (HCC) 01/23/2019   Peripheral polyneuropathy 10/24/2018   PAD (peripheral artery disease) (HCC)  04/05/2018   Carotid artery disease (HCC) 04/05/2018   Hypokalemia 02/27/2018   Frequent falls 07/08/2017   Back pain 07/08/2017   Mixed hyperlipidemia 05/27/2017   Unstable gait 01/10/2017   Hyperlipidemia associated with type 2 diabetes mellitus (HCC) 01/10/2017   OSA on CPAP 12/24/2016   Pulmonary nodule 12/24/2016   Generalized weakness 12/12/2015   Diabetes mellitus type 2 with neurological manifestations (HCC) 12/12/2015   Hypertension with heart disease 12/12/2015   Seizure disorder as sequela of cerebrovascular accident (HCC) 04/17/2015   CAD (coronary artery disease) 04/17/2015   Anemia, unspecified 03/02/2010   Cough 03/02/2010   Depressive disorder, not elsewhere classified 03/02/2010   Pneumonia, organism unspecified(486) 03/02/2010   Shortness of breath 03/02/2010   Type II diabetes mellitus with neurological manifestations (HCC) 03/02/2010   Wheezing 03/02/2010   COPD (chronic obstructive pulmonary disease) (HCC) 03/02/2010   Essential hypertension 03/02/2010   Acute, but ill-defined, cerebrovascular disease 03/02/2010   Past Medical History:  Diagnosis Date   Allergy    Arthritis    Bipolar 1 disorder (HCC)    Blood transfusion without reported diagnosis    CAD (coronary artery disease) 04/17/2015   Carotid artery stenosis    50-69% bilateral ICA stenoses by velocity criteria but no visible plaque and ICA/CCA ratio 1.72 on right and 1.38 on left   Chronic airway obstruction, not elsewhere classified 03/02/2010   Overview:  Chronic Obstructive Pulmonary Disease   CKD (chronic kidney disease) stage 3, GFR 30-59 ml/min (  HCC) 09/18/2021   Coronary artery disease    Depression    Diabetes mellitus without complication (HCC)    Echocardiogram    Echo 06/2018: Hyperdynamic systolic function, EF >65, mild concentric LVH, elevated LVEDP (E/E' suggests impaired relaxation), mild AI, lipomatous interatrial septum   Hypertension    Left adrenal mass (HCC)    2.5 cm L  adrenal mass on CT per note of Dr. Ledell Peoples 05/20/2014   MI, old    PAD (peripheral artery disease) (HCC)    03/2014 - CT aortogram with lower extremity runoff (mild to moderate disease in common iliac arteries, complete occulusion of her bilateral external iliac arteries with heavily disease common femoral arteries. She also has disease to her SFAs bilaterally. Popliteal arteries and tibial vessel runoff appears to be adequate)   Seizures (HCC)    Stroke [facility])    Past Surgical History:  Procedure Laterality Date   BREAST SURGERY  2012   small lump nonmilignant   NECK SURGERY     Social History   Tobacco Use   Smoking status: Former    Current packs/day: 0.00    Types: Cigarettes    Quit date: 2015    Years since quitting: 10.1   Smokeless tobacco: Never  Vaping Use   Vaping status: Never Used  Substance Use Topics   Alcohol use: No   Drug use: No   Family History  Problem Relation Age of Onset   Hypertension Mother    Arthritis Mother    Diabetes Mother    Early death Mother    Hyperlipidemia Mother    Stroke Mother    Hypertension Father    Alcohol abuse Father    Early death Father    Hyperlipidemia Father    COPD Father    Heart attack Father    Hypertension Sister    Birth defects Brother    Depression Brother    Hyperlipidemia Brother    Hypertension Brother    Arthritis Daughter    Asthma Daughter    COPD Daughter    Arthritis Son    COPD Son    Hypertension Son    Hyperlipidemia Son    Heart attack Son    Arthritis Maternal Grandmother    Cancer Maternal Grandmother    Allergies  Allergen Reactions   Amoxicillin Other (See Comments)    colitis   Augmentin [Amoxicillin-Pot Clavulanate] Other (See Comments)    colitis   Cephalexin Other (See Comments)    Colitis - ceftriaxone ok      ROS Negative unless stated above    Objective:     BP (!) 172/84 (BP Location: Left Arm, Patient Position: Sitting, Cuff Size: Normal)   Pulse 76   Temp 97.7  F (36.5 C) (Oral)   Ht 5\' 7"  (1.702 m)   Wt 127 lb 9.6 oz (57.9 kg)   LMP 03/17/2015   SpO2 97%   BMI 19.98 kg/m  BP Readings from Last 3 Encounters:  02/24/23 (!) 168/74  11/01/22 120/70  04/29/22 (!) 165/76   Wt Readings from Last 3 Encounters:  02/24/23 127 lb 9.6 oz (57.9 kg)  02/22/23 128 lb (58.1 kg)  04/29/22 132 lb (59.9 kg)     Physical Exam Constitutional:      Appearance: Normal appearance.  HENT:     Head: Normocephalic and atraumatic.     Nose: Nose normal.     Mouth/Throat:     Mouth: Mucous membranes are moist.  Eyes:  Extraocular Movements: Extraocular movements intact.     Pupils: Pupils are equal, round, and reactive to light.  Cardiovascular:     Rate and Rhythm: Normal rate.  Pulmonary:     Effort: Pulmonary effort is normal.  Skin:    General: Skin is warm and dry.  Neurological:     Mental Status: She is alert.     Cranial Nerves: Cranial nerves 2-12 are intact.     Gait: Gait is intact.     Comments: A & O x 2 (person and place).  Patient sitting in transport wheelchair.  Gait intact.  6-minute walk test performed.  Psychiatric:        Behavior: Behavior is cooperative.     No results found for any visits on 02/24/23.    Assessment & Plan:  Chronic obstructive pulmonary disease, unspecified COPD type (HCC) -Stable. -Lungs clear on exam. -Out of Spiriva x 2 weeks due to cost. -Will reach out to clinical pharmacist in regards to more affordable medication options. -Continue current medications.  SOB (shortness of breath) on exertion -pO2 97 at time of visit -6-minute walk test performed and normal. -Discussed deconditioning.  Advised to increase physical activity daily.  Also advised worsening OSA may be contributing to symptoms. -Continue current inhalers. -Consider PT  Dysphagia, unspecified type -Advised to slow down when eating and cut up meat into bite-size pieces. -Also discussed drinking water to help get food  down. -Demonstrated supraglottic swallow technique. -Follow-up with PCP   OSA (obstructive sleep apnea) -Patient refuses to wear CPAP.  Dementia without behavioral disturbance (HCC) -Vascular dementia -Stable per chart review.  Seen by neurology 02/22/2023.  Per note "cognitive changes stable since stroke in 2015". -Continue following with neurology.  Essential hypertension -Uncontrolled.  Likely 2/2 pt just taking medication prior to visit. -Advised to monitor BP closely.  For elevations consistently greater than 140/90 notify clinic for medication adjustments. -Continue hydralazine 25 mg 3 times daily, Norvasc 10 mg daily, Imdur 30 mg daily, losartan 50 mg daily, Lopressor 50 mg twice daily, spironolactone 50 mg daily -Continue lifestyle modifications  Confusion -Unclear if increase in confusion -Discussed UA to rule out UTI. -Discussed the importance of drinking water.  Patient declines as does not like water. -Given strict precautions -     POCT urinalysis dipstick  Given patient's chronic conditions advised on need to follow-up with PCP in the future.  Return in about 4 weeks (around 03/24/2023) for chronic conditions with pcp.Deeann Saint, MD

## 2023-02-24 NOTE — Telephone Encounter (Signed)
Saw Kathryn Lewis today. Pt's daughter asking for a refill of the patient's meds, not sure if she mentioned it in the appointment today. Asking that the request be expedited as they are leaving town around 230-3.

## 2023-02-25 MED ORDER — BUDESONIDE-FORMOTEROL FUMARATE 160-4.5 MCG/ACT IN AERO: 2.0000 | INHALATION_SPRAY | Freq: Two times a day (BID) | RESPIRATORY_TRACT | 3 refills | Status: AC

## 2023-02-25 MED ORDER — TIOTROPIUM BROMIDE MONOHYDRATE 18 MCG IN CAPS: 18.0000 ug | ORAL_CAPSULE | Freq: Every day | RESPIRATORY_TRACT | 5 refills | Status: AC

## 2023-02-25 MED ORDER — ALBUTEROL SULFATE HFA 108 (90 BASE) MCG/ACT IN AERS: 2.0000 | INHALATION_SPRAY | Freq: Four times a day (QID) | RESPIRATORY_TRACT | 6 refills | Status: AC | PRN

## 2023-02-28 NOTE — Telephone Encounter (Signed)
FYI: Patient does not qualify for [facility] assistance. Decline to apply for Extra Help/LIS. Confirm they can afford current inhalers at this time and decline any changes.

## 2023-04-20 ENCOUNTER — Telehealth: Payer: Self-pay | Admitting: Family Medicine

## 2023-04-20 NOTE — Telephone Encounter (Signed)
 Spoke with patient to schedule AWV  She stated she has moved to [place]

## 2023-06-17 ENCOUNTER — Other Ambulatory Visit: Payer: Self-pay | Admitting: Neurology

## 2023-07-16 ENCOUNTER — Other Ambulatory Visit: Payer: Self-pay | Admitting: Neurology

## 2023-08-11 ENCOUNTER — Other Ambulatory Visit: Payer: Self-pay | Admitting: Family Medicine

## 2023-10-31 ENCOUNTER — Other Ambulatory Visit: Payer: Self-pay | Admitting: Neurology

## 2023-10-31 ENCOUNTER — Other Ambulatory Visit: Payer: Self-pay | Admitting: Family Medicine
